# Patient Record
Sex: Male | Born: 1965 | Race: Black or African American | Hispanic: No | Marital: Single | Smoking: Former smoker
Health system: Southern US, Community
[De-identification: ages and names within clinical notes are randomized; demographics above are authoritative.]

## PROBLEM LIST (undated history)

## (undated) DIAGNOSIS — N183 Chronic kidney disease, stage 3 unspecified: Secondary | ICD-10-CM

## (undated) DIAGNOSIS — F03911 Unspecified dementia, unspecified severity, with agitation: Secondary | ICD-10-CM

## (undated) DIAGNOSIS — I639 Cerebral infarction, unspecified: Secondary | ICD-10-CM

## (undated) DIAGNOSIS — F02C0 Dementia in other diseases classified elsewhere, severe, without behavioral disturbance, psychotic disturbance, mood disturbance, and anxiety: Secondary | ICD-10-CM

## (undated) DIAGNOSIS — Z7251 High risk heterosexual behavior: Secondary | ICD-10-CM

## (undated) DIAGNOSIS — F329 Major depressive disorder, single episode, unspecified: Secondary | ICD-10-CM

## (undated) DIAGNOSIS — E119 Type 2 diabetes mellitus without complications: Secondary | ICD-10-CM

## (undated) DIAGNOSIS — F062 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: Secondary | ICD-10-CM

## (undated) DIAGNOSIS — H5440 Blindness, one eye, unspecified eye: Secondary | ICD-10-CM

## (undated) DIAGNOSIS — D649 Anemia, unspecified: Secondary | ICD-10-CM

## (undated) DIAGNOSIS — R3989 Other symptoms and signs involving the genitourinary system: Secondary | ICD-10-CM

## (undated) DIAGNOSIS — F419 Anxiety disorder, unspecified: Secondary | ICD-10-CM

## (undated) DIAGNOSIS — F01A2 Vascular dementia, mild, with psychotic disturbance: Secondary | ICD-10-CM

## (undated) DIAGNOSIS — R112 Nausea with vomiting, unspecified: Secondary | ICD-10-CM

## (undated) DIAGNOSIS — E559 Vitamin D deficiency, unspecified: Secondary | ICD-10-CM

## (undated) DIAGNOSIS — I10 Essential (primary) hypertension: Secondary | ICD-10-CM

## (undated) DIAGNOSIS — E0591 Thyrotoxicosis, unspecified with thyrotoxic crisis or storm: Secondary | ICD-10-CM

## (undated) DIAGNOSIS — Z8616 Personal history of COVID-19: Secondary | ICD-10-CM

## (undated) DIAGNOSIS — K5900 Constipation, unspecified: Secondary | ICD-10-CM

## (undated) DIAGNOSIS — I509 Heart failure, unspecified: Secondary | ICD-10-CM

## (undated) DIAGNOSIS — Z8673 Personal history of transient ischemic attack (TIA), and cerebral infarction without residual deficits: Secondary | ICD-10-CM

## (undated) DIAGNOSIS — R4182 Altered mental status, unspecified: Secondary | ICD-10-CM

## (undated) DIAGNOSIS — N529 Male erectile dysfunction, unspecified: Secondary | ICD-10-CM

## (undated) DIAGNOSIS — R195 Other fecal abnormalities: Secondary | ICD-10-CM

## (undated) DIAGNOSIS — R531 Weakness: Secondary | ICD-10-CM

## (undated) DIAGNOSIS — R52 Pain, unspecified: Secondary | ICD-10-CM

## (undated) HISTORY — PX: GYNECOMASTIA EXCISION: SHX5170

---

## 2005-06-12 ENCOUNTER — Emergency Department (HOSPITAL_COMMUNITY): Admission: EM | Admit: 2005-06-12 | Discharge: 2005-06-12 | Payer: Self-pay | Admitting: Emergency Medicine

## 2005-07-26 ENCOUNTER — Emergency Department (HOSPITAL_COMMUNITY): Admission: EM | Admit: 2005-07-26 | Discharge: 2005-07-26 | Payer: Self-pay | Admitting: Emergency Medicine
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
## 2006-11-21 ENCOUNTER — Emergency Department (HOSPITAL_COMMUNITY): Admission: EM | Admit: 2006-11-21 | Discharge: 2006-11-21 | Payer: Self-pay | Admitting: Emergency Medicine

## 2008-07-10 ENCOUNTER — Emergency Department (HOSPITAL_COMMUNITY): Admission: EM | Admit: 2008-07-10 | Discharge: 2008-07-10 | Payer: Self-pay | Admitting: Emergency Medicine

## 2009-03-07 ENCOUNTER — Emergency Department (HOSPITAL_COMMUNITY): Admission: EM | Admit: 2009-03-07 | Discharge: 2009-03-07 | Payer: Self-pay | Admitting: Family Medicine

## 2011-02-24 ENCOUNTER — Emergency Department (HOSPITAL_COMMUNITY)
Admission: EM | Admit: 2011-02-24 | Discharge: 2011-02-24 | Discharge status: Absent without leave | Payer: Self-pay | Source: Home / Self Care

## 2011-02-25 ENCOUNTER — Emergency Department (INDEPENDENT_AMBULATORY_CARE_PROVIDER_SITE_OTHER)
Admission: EM | Admit: 2011-02-25 | Discharge: 2011-02-25 | Discharge status: Against Medical Advice | Payer: Self-pay | Source: Home / Self Care | Attending: Family Medicine | Admitting: Family Medicine

## 2011-02-25 ENCOUNTER — Encounter (HOSPITAL_COMMUNITY): Payer: Self-pay | Admitting: Family Medicine

## 2011-02-25 DIAGNOSIS — I10 Essential (primary) hypertension: Secondary | ICD-10-CM

## 2011-02-25 NOTE — ED Provider Notes (Addendum)
History     CSN: [id number]  Arrival date & time 02/25/11  0818   First MD Initiated Contact with Patient 02/25/11 0825      Chief Complaint  Patient presents with  . 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 He states his bp was elevated 2 yrs ago but he was passed so he thought it was ok. He does not have a pcp so he came here. He denies any symptoms. No ha, no dizziness, no blurred vision, no weakness. States he drinks a lot of caffeine and smokes. His father has hypertension.   After interview i discussed with him the fact that his blood pressure is very high and that he could have a stroke if left untreated. i told him we would need to do a blood test to check his kidney function and check a urine. He refused to have blood drawn. He left. Tried to reason with him and my nurse also tried to reason with him to no avail.   The history is provided by the patient.    History reviewed. No pertinent past medical history.  History reviewed. No pertinent past surgical history.  No family history on file.  History  Substance Use Topics  . Smoking status: Current Everyday Smoker -- 2.0 packs/day  . Smokeless tobacco: Not on file  . Alcohol Use: No      Review of Systems  Allergies  Review of patient's allergies indicates no known allergies.  Home Medications  No current outpatient prescriptions on file.  BP 218/118  Pulse 104  Temp(Src) 98.1 F (36.7 C) (Oral)  Resp 14  SpO2 100%  Physical Exam  ED Course  Procedures (including critical care time) The patient left after refusing to have blood drawn. Labs Reviewed - No data to display No results found.  Hypertension 1. Hypertension   left ama  MDM          Randa Spike, MD 02/25/11 8295  Randa Spike,  MD 05/31/11 [phone]

## 2011-02-25 NOTE — ED Notes (Signed)
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  Pt denies headache, weakness, SOB or pain.  States he feels good- like normal. No hx of elevated BP per pt.

## 2011-02-25 NOTE — ED Notes (Signed)
Pt refuses blood draw.  States "I don't do needles". Attempted to talk with pt and explain rationale for labs and importance of reducing his very high BP.  Strongly encouraged him to reconsider and explained his BP is dangerously high. States he just wants medication and will not have his blood drawn.  States he is leaving to get a second opinion.  Pt angry/upset, unable to get AMA form- left room and said "thanks for nothing".

## 2011-02-25 NOTE — ED Notes (Signed)
Pt's BP informed to RN

## 2012-04-08 ENCOUNTER — Emergency Department (HOSPITAL_COMMUNITY)
Admission: EM | Admit: 2012-04-08 | Discharge: 2012-04-08 | Discharge status: Against Medical Advice | Payer: No Typology Code available for payment source | Attending: Emergency Medicine | Admitting: Emergency Medicine

## 2012-04-08 ENCOUNTER — Encounter (HOSPITAL_COMMUNITY): Payer: Self-pay | Admitting: *Deleted

## 2012-04-08 DIAGNOSIS — S199XXA Unspecified injury of neck, initial encounter: Secondary | ICD-10-CM | POA: Insufficient documentation

## 2012-04-08 DIAGNOSIS — Y92410 Unspecified street and highway as the place of occurrence of the external cause: Secondary | ICD-10-CM | POA: Insufficient documentation

## 2012-04-08 DIAGNOSIS — S39012A Strain of muscle, fascia and tendon of lower back, initial encounter: Secondary | ICD-10-CM

## 2012-04-08 DIAGNOSIS — S335XXA Sprain of ligaments of lumbar spine, initial encounter: Secondary | ICD-10-CM | POA: Insufficient documentation

## 2012-04-08 DIAGNOSIS — Y9389 Activity, other specified: Secondary | ICD-10-CM | POA: Insufficient documentation

## 2012-04-08 DIAGNOSIS — Z79899 Other long term (current) drug therapy: Secondary | ICD-10-CM | POA: Insufficient documentation

## 2012-04-08 DIAGNOSIS — S0993XA Unspecified injury of face, initial encounter: Secondary | ICD-10-CM | POA: Insufficient documentation

## 2012-04-08 DIAGNOSIS — F17200 Nicotine dependence, unspecified, uncomplicated: Secondary | ICD-10-CM | POA: Insufficient documentation

## 2012-04-08 DIAGNOSIS — I10 Essential (primary) hypertension: Secondary | ICD-10-CM | POA: Insufficient documentation

## 2012-04-08 HISTORY — DX: Essential (primary) hypertension: I10

## 2012-04-08 MED ORDER — AMLODIPINE BESYLATE 10 MG PO TABS
10.0000 mg | ORAL_TABLET | Freq: Once | ORAL | Status: AC
  Administered 2012-04-08: 10 mg via ORAL
  Filled 2012-04-08: qty 1

## 2012-04-08 MED ORDER — HYDROCODONE-ACETAMINOPHEN 5-325 MG PO TABS: 1.0000 | ORAL_TABLET | Freq: Four times a day (QID) | ORAL | Status: DC | PRN

## 2012-04-08 MED ORDER — HYDROCODONE-ACETAMINOPHEN 5-325 MG PO TABS
1.0000 | ORAL_TABLET | Freq: Once | ORAL | Status: AC
  Administered 2012-04-08: 1 via ORAL
  Filled 2012-04-08: qty 1

## 2012-04-08 MED ORDER — AMLODIPINE BESYLATE 2.5 MG PO TABS: 10.0000 mg | ORAL_TABLET | Freq: Every day | ORAL | Status: DC

## 2012-04-08 NOTE — ED Notes (Signed)
MD Lynelle Doctor notified of BP.
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
## 2012-04-08 NOTE — ED Notes (Signed)
Patient decided to leave AMA.  He was informed of his medical need to stay and be treated but refused.

## 2012-04-08 NOTE — ED Notes (Signed)
Patient refused to give urine sample
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
## 2013-04-14 DIAGNOSIS — IMO0001 Reserved for inherently not codable concepts without codable children: Secondary | ICD-10-CM | POA: Insufficient documentation

## 2016-09-26 ENCOUNTER — Other Ambulatory Visit: Payer: Self-pay

## 2016-09-26 ENCOUNTER — Encounter (HOSPITAL_COMMUNITY): Payer: Self-pay | Admitting: Emergency Medicine

## 2016-09-26 ENCOUNTER — Emergency Department (HOSPITAL_COMMUNITY)
Admission: EM | Admit: 2016-09-26 | Discharge: 2016-09-27 | Disposition: A | Discharge status: Home / Self Care | Payer: Self-pay | Attending: Emergency Medicine | Admitting: Emergency Medicine

## 2016-09-26 DIAGNOSIS — Z5321 Procedure and treatment not carried out due to patient leaving prior to being seen by health care provider: Secondary | ICD-10-CM | POA: Insufficient documentation

## 2016-09-26 DIAGNOSIS — W19XXXA Unspecified fall, initial encounter: Secondary | ICD-10-CM | POA: Insufficient documentation

## 2016-09-26 HISTORY — DX: Type 2 diabetes mellitus without complications: E11.9

## 2016-09-26 NOTE — ED Notes (Signed)
Pt came to triage wanting BP rechecked.  Still refuses blood work.  States he took a nap and woke up and still feels fine and wants to leave.

## 2016-09-26 NOTE — ED Notes (Signed)
Pt requested water.  Explained that he would have to see MD first.

## 2016-09-26 NOTE — ED Notes (Signed)
Spoke with pt's mother in waiting room and encouraged her to try to get pt to let us draw his labs.
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
## 2016-09-26 NOTE — ED Notes (Signed)
Explained to pt that he needs lab work drawn and we need to check blood sugar.  He refuses and says he doesn't want to be stuck.

## 2016-09-26 NOTE — ED Notes (Signed)
Pt's mother states that he has been scared on needles since a child and still refuses for [place] to draw his labs.

## 2016-10-21 ENCOUNTER — Emergency Department (HOSPITAL_COMMUNITY)
Admission: EM | Admit: 2016-10-21 | Discharge: 2016-10-21 | Disposition: A | Discharge status: Home / Self Care | Payer: Self-pay | Attending: Emergency Medicine | Admitting: Emergency Medicine

## 2016-10-21 ENCOUNTER — Encounter (HOSPITAL_COMMUNITY): Payer: Self-pay | Admitting: Emergency Medicine

## 2016-10-21 DIAGNOSIS — Y9389 Activity, other specified: Secondary | ICD-10-CM | POA: Insufficient documentation

## 2016-10-21 DIAGNOSIS — Y999 Unspecified external cause status: Secondary | ICD-10-CM | POA: Insufficient documentation

## 2016-10-21 DIAGNOSIS — M791 Myalgia, unspecified site: Secondary | ICD-10-CM | POA: Insufficient documentation

## 2016-10-21 DIAGNOSIS — Z79899 Other long term (current) drug therapy: Secondary | ICD-10-CM | POA: Insufficient documentation

## 2016-10-21 DIAGNOSIS — E119 Type 2 diabetes mellitus without complications: Secondary | ICD-10-CM | POA: Insufficient documentation

## 2016-10-21 DIAGNOSIS — Y92410 Unspecified street and highway as the place of occurrence of the external cause: Secondary | ICD-10-CM | POA: Insufficient documentation

## 2016-10-21 DIAGNOSIS — F17200 Nicotine dependence, unspecified, uncomplicated: Secondary | ICD-10-CM | POA: Insufficient documentation

## 2016-10-21 DIAGNOSIS — I10 Essential (primary) hypertension: Secondary | ICD-10-CM | POA: Insufficient documentation

## 2016-10-21 MED ORDER — ACETAMINOPHEN 325 MG PO TABS: 650.0000 mg | ORAL_TABLET | Freq: Four times a day (QID) | ORAL | 0 refills | Status: DC | PRN

## 2016-10-21 MED ORDER — CYCLOBENZAPRINE HCL 5 MG PO TABS: 5.0000 mg | ORAL_TABLET | Freq: Two times a day (BID) | ORAL | 0 refills | Status: DC | PRN
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
## 2016-10-21 NOTE — Discharge Instructions (Signed)
Take tylenol as needed for pain.  Use ice or heat if it helps control your pain.  Use flexeril as needed for muscle stiffness or soreness. Have caution, as this can cause you to be more tired. Do not drive or operate heavy machinery until you know how this effects you.  Continue to take your blood pressure medication. Try not to take ibuprofen, advil, or motrin, as this can raise your blood pressure.  You will likely have continued muscle stiffness/soreness over the next several days. If your pain has not improved, follow up with your primary care doctor in 1 week.  Follow up with your primary [place] doctor for evaluation and management of your blood pressure.  Return to the ER if you develop vision changes, vomiting, numbness, loss of bowel or bladder control, one-sided weakness, or any new or worsening symptoms.

## 2016-10-21 NOTE — ED Notes (Signed)
Pt ambulated out of the ED in no distress. He declined discharge vitals and stated, "Im not having a headache, dizziness or feelings that it is high. I know what to watch for." Pt states that he thinks his BP is elevated because he is anxious about being in the ED and he smoked 3 cigarettes PTA. A&Ox4.
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
## 2017-02-04 NOTE — Progress Notes (Signed)
[facility] - Clinic Note  02/07/2017     CHIEF COMPLAINT Patient presents for Retina Evaluation and Diabetic Eye Exam   HISTORY OF PRESENT ILLNESS: Kevin [name] is a 52 y.o. male who presents to the clinic today for:   HPI    Retina Evaluation    In both eyes.  This started 2 weeks ago.  Associated Symptoms Negative for Blind Spot, Glare, Shoulder/Hip pain, Fatigue, Jaw Claudication, Photophobia, Distortion, Floaters, Redness, Scalp Tenderness, Weight Loss, Fever, Trauma, Pain and Flashes.  Context:  distance vision, mid-range vision and near vision.  Treatments tried include no treatments.  I, the attending physician,  performed the HPI with the patient and updated documentation appropriately.          Diabetic Eye Exam    Vision is blurred for near and is blurred for distance.  Associated Symptoms Negative for Blind Spot, Glare, Shoulder/Hip pain, Fatigue, Jaw Claudication, Photophobia, Distortion, Floaters, Redness, Scalp Tenderness, Weight Loss, Fever, Trauma, Pain and Flashes.  Diabetes characteristics include Type 2 and taking oral medications.  This started 1 year ago.  Blood sugar level is controlled.  Last Blood Glucose 102.  I, the attending physician,  performed the HPI with the patient and updated documentation appropriately.          Comments    Referral of Dr. Conley Rolls for retina evaluation. Patient states vision has become "blurry left eye in the last couple of weeks". Pt reports he is a diabetic 2 and takes Metformin qd. Pt states his BS are stable and he has not noticed any visual changes with BS. Bs 102 (02/06/17) Last A1C 10/2016( pt does not recall results). Denies vits/gtts        Last edited by Rennis Chris, MD on 02/07/2017 10:08 AM. (History)    Pt states he saw Dr. 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; Pt reports he is diabetic and has  HTN; Pt reports there was a time he was off all meds x 1 year due to "they made me feel funny"; Pt states he began medications back around Thanksgiving; Pt states he had a to call an ambulance around that time due to falling out of a chair, pt states his CBG was 800 and reports BP was very elevated; Pt states he refused to stay in hospital for monitoring; Pt reports he "has not problem with right eye" VA; Pt reports last CBG was 102 yesterday, pt states he is on medications, not watching what he eats; Pt reports he does smoke; Pt reports he was seen by PCP in November, reports he added HCTZ for "ankles swelling"; Pt report he takes pills regularly; Pt endorses taking medications this morning; Pt denies having any headaches; pt denies having any childhood ocular issues; Pt states he was incarcerated x 30 days, pt states he was given medications but was unsure if they were "the right medications";   Referring physician: Conley Rolls [place], [place] [address] [place], [place] [address]  HISTORICAL INFORMATION:   Selected notes from the MEDICAL RECORD NUMBER Referred by Dr. Aletta Edouard for concern of decrease VA OU, OS>OD;  LEE- unknown [BCVA OD: 20/60 OS: 20/400] Ocular Hx-  PMH- DM, HTN, current smoker    CURRENT MEDICATIONS: No current outpatient medications on file. (Ophthalmic Drugs)   No current facility-administered medications  for this visit.  (Ophthalmic Drugs)   Current Outpatient Medications (Other)  Medication Sig  . lisinopril-hydrochlorothiazide (PRINZIDE,ZESTORETIC) 10-12.5 MG tablet Take 1 tablet by mouth daily.  . metFORMIN (GLUCOPHAGE) 1000 MG tablet Take 1,000 mg by mouth 2 (two) times daily.  Marland Kitchen acetaminophen (TYLENOL) 325 MG tablet Take 2 tablets (650 mg total) by mouth every 6 (six) hours as needed (pain). (Patient not taking: Reported on 02/07/2017)  . amLODipine (NORVASC) 2.5 MG tablet Take 4 tablets (10 mg total) by mouth daily. (Patient not taking: Reported on 02/07/2017)  .  cyclobenzaprine (FLEXERIL) 5 MG tablet Take 1 tablet (5 mg total) by mouth 2 (two) times daily as needed for muscle spasms. (Patient not taking: Reported on 02/07/2017)  . HYDROcodone-acetaminophen (NORCO) 5-325 MG per tablet Take 1-2 tablets by mouth every 6 (six) hours as needed for pain. (Patient not taking: Reported on 02/07/2017)   No current facility-administered medications for this visit.  (Other)      REVIEW OF SYSTEMS: ROS    Positive for: Endocrine, Eyes   Negative for: Constitutional, Gastrointestinal, Neurological, Skin, Genitourinary, Musculoskeletal, HENT, Cardiovascular, Respiratory, Psychiatric, Allergic/Imm, Heme/Lymph   Last edited by Eldridge Scot, LPN on 6/57/8469  9:59 AM. (History)       ALLERGIES No Known Allergies  PAST MEDICAL HISTORY Past Medical History:  Diagnosis Date  . Diabetes mellitus without complication (HCC)   . Hypertension    History reviewed. No pertinent surgical history.  FAMILY HISTORY Family History  Problem Relation Age of Onset  . Diabetes Mother   . Hypertension Mother   . Hypertension Father   . Diabetes Maternal Grandmother   . Hypertension Maternal Grandmother   . Arthritis Maternal Grandmother     SOCIAL HISTORY Social History   Tobacco Use  . Smoking status: Current Every Day Smoker    Packs/day: 2.00  . Smokeless tobacco: Never Used  Substance Use Topics  . Alcohol use: Yes  . Drug use: Yes    Types: Marijuana         OPHTHALMIC EXAM:  Base Eye Exam    Visual Acuity (Snellen - Linear)      Right Left   Dist [place] 20/70 -1 CF @ face   Dist ph [place] 20/50 -1 NI       Tonometry (Tonopen, 10:03 AM)      Right Left   Pressure 11 13       Pupils      Dark Light Shape React APD   Right 5 4 Round Minimal None   Left 5 4 Round Minimal None       Visual Fields (Counting fingers)      Left Right     Full   Restrictions Partial outer inferior nasal deficiency        Extraocular Movement      Right  Left    Full, Ortho Full, Ortho       Neuro/Psych    Oriented x3:  Yes   Mood/Affect:  Normal       Dilation    Both eyes:  1.0% Mydriacyl, 2.5% Phenylephrine @ 10:03 AM        Slit Lamp and Fundus Exam    Slit Lamp Exam      Right Left   Lids/Lashes Meibomian gland dysfunction Meibomian gland dysfunction   Conjunctiva/Sclera Melanosis Melanosis   Cornea Mild Arcus, mild Debris in tear film Mild Arcus, mild Debris in tear film   Anterior Chamber Deep and quiet Deep  and quiet   Iris Round and dilated, No NVI Round and dilated, No NVI   Lens 2+ Nuclear sclerosis, 2+ Cortical cataract 2+ Nuclear sclerosis, 2+ Cortical cataract   Vitreous Normal Normal       Fundus Exam      Right Left   Disc Inferior NVD/collaterals 2+ Optic disc edema, 360 heme   C/D Ratio 0.35 0.2   Macula Blunted foveal reflex, +CME, inferior IRH massive swelling and IRH   Vessels Dilation, Tortuous, inferior sclerotic arteriorls, inferior BRVO, Copper wiring CRVO; Copper wiring, sclerotic vessles, Vascular attenuation, Dilation, Tortuous   Periphery Attached; inferior BRVO with IRH Confluent hemorrhage 360          IMAGING AND PROCEDURES  Imaging and Procedures for 02/07/17  OCT, Retina - OU - Both Eyes     Right Eye Quality was good. Central Foveal Thickness: 604. Progression has no prior data. Findings include abnormal foveal contour, subretinal fluid, intraretinal fluid, outer retinal atrophy.   Left Eye Quality was good. Central Foveal Thickness: (Unable to obtain). Progression has no prior data. Findings include intraretinal fluid, intraretinal hyper-reflective material, abnormal foveal contour, subretinal fluid (Massive CME w/ SRF).   Notes *Images captured and stored on drive  Diagnosis / Impression:  CME and SRF OU, OS >>> OD  Clinical management:  See below  Abbreviations: NFP - Normal foveal profile. CME - cystoid macular edema. PED - pigment epithelial detachment. IRF -  intraretinal fluid. SRF - subretinal fluid. EZ - ellipsoid zone. ERM - epiretinal membrane. ORA - outer retinal atrophy. ORT - outer retinal tubulation. SRHM - subretinal hyper-reflective material                 ASSESSMENT/PLAN:    ICD-10-CM   1. Central retinal vein occlusion with macular edema of left eye H34.8120 OCT, Retina - OU - Both Eyes  2. Branch retinal vein occlusion of right eye with macular edema H34.8310   3. Essential hypertension I10   4. Hypertensive retinopathy of both eyes H35.033   5. Moderate nonproliferative diabetic retinopathy of both eyes with macular edema associated with type 2 diabetes mellitus (HCC) Z61.0960E11.3313   6. Combined forms of age-related cataract of both eyes H25.813     1. 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  - Specifically discussed findings from CRUISE / BRAVO study regarding patient stabilization with anti-VEGF agents and increased potential for visual improvements.  Also discussed need for frequent follow up and potentially multiple injections given the chronic nature of the disease process  - massive CME and inner retinal hyperreflectivity on OCT OS suggesting combined CRVO and CRAO OS  - BP in office 220s/130s  - discussed importance of tighter control of BP and need for anti-VEGF therapy, but cannot treat with BP so high  - recommend immediate evaluation of BP by PCP or ED  - will send letter to PCP  - F/U 2 weeks   2. BRVO w/ macular edema OD - inferior BRVO with inferior disc collaterals vs sectoral NVD  OD - OCT shows central CME OD - recommend IVA, but as above, BP too high to treat - F/U  2 weeks -- DFE/OCT/possible injection  3,4. Hypertensive retinopathy OU - discussed importance of tight BP control - monitor  5. Moderate non-proliferative diabetic retinopathy, both eyes - The incidence, risk factors for progression, natural history and treatment options for diabetic retinopathy were discussed with patient.   - The need for close monitoring of blood glucose, blood pressure, and serum lipids, avoiding cigarette or any type of tobacco, and the need for long term follow up was also discussed with patient. - DM2 fairly well controlled, but likely contributing to #1,2 above  6. Combined form age-related cataract OU-  - The symptoms of cataract, surgical options, and treatments and risks were discussed with patient. - discussed diagnosis and progression - not yet visually significant - monitor for now   Ophthalmic Meds Ordered this visit:  No orders of the defined types were placed in this encounter.      Return in about 2 weeks (around 02/21/2017) for F/U CRAO/CRVO OS.  There are no Patient Instructions on file for this visit.   Explained the diagnoses, plan, and follow up with the patient and they expressed understanding.  Patient expressed understanding of the importance of proper follow up care.   This document serves as a record of services personally performed by Karie Chimera, MD, PhD. It was created on their behalf by Virgilio Belling, COA, a certified ophthalmic assistant. The creation of this record is the provider's dictation and/or activities during the visit.  Electronically signed by: Virgilio Belling, COA  02/07/17 11:27 AM    Karie Chimera, M.D., Ph.D. Diseases & Surgery of the Retina and Vitreous [facility] 02/07/17   I have reviewed the above documentation for accuracy and completeness, and I agree with the above. Karie Chimera,  M.D., Ph.D. 02/07/17 11:41 AM    Abbreviations: M myopia (nearsighted); A astigmatism; H hyperopia (farsighted); P presbyopia; Mrx spectacle prescription;  CTL contact lenses; OD right eye; OS left eye; OU both eyes  XT exotropia; ET esotropia; PEK punctate epithelial keratitis; PEE punctate epithelial erosions; DES dry eye syndrome; MGD meibomian gland dysfunction; ATs artificial tears; PFAT's preservative free artificial tears; NSC nuclear sclerotic cataract; PSC posterior subcapsular cataract; ERM epi-retinal membrane; PVD posterior vitreous detachment; RD retinal detachment; DM diabetes mellitus; DR diabetic retinopathy; NPDR non-proliferative diabetic retinopathy; PDR proliferative diabetic retinopathy; CSME clinically significant macular edema; DME diabetic macular edema; dbh dot blot hemorrhages; CWS cotton wool spot; POAG primary open angle glaucoma; C/D cup-to-disc ratio; HVF humphrey visual field; GVF goldmann visual field; OCT optical coherence tomography; IOP intraocular pressure; BRVO Branch retinal vein occlusion; CRVO central retinal vein occlusion; CRAO central retinal artery occlusion; BRAO branch retinal artery occlusion; RT retinal tear; SB scleral buckle; PPV pars plana vitrectomy; VH Vitreous hemorrhage; PRP panretinal laser photocoagulation; IVK intravitreal kenalog; VMT vitreomacular traction; MH Macular hole;  NVD neovascularization of the disc; NVE neovascularization elsewhere; AREDS age related eye disease study; ARMD age related macular degeneration; POAG primary open angle glaucoma; EBMD epithelial/anterior basement membrane dystrophy; ACIOL anterior chamber intraocular lens; IOL intraocular lens; PCIOL posterior chamber intraocular lens; Phaco/IOL phacoemulsification with intraocular lens placement; PRK photorefractive keratectomy; LASIK laser assisted in situ keratomileusis; HTN hypertension; DM diabetes mellitus; COPD chronic obstructive pulmonary disease

## 2017-02-07 ENCOUNTER — Ambulatory Visit (INDEPENDENT_AMBULATORY_CARE_PROVIDER_SITE_OTHER): Payer: Self-pay | Admitting: Ophthalmology

## 2017-02-07 ENCOUNTER — Encounter (INDEPENDENT_AMBULATORY_CARE_PROVIDER_SITE_OTHER): Payer: Self-pay | Admitting: Ophthalmology

## 2017-02-07 VITALS — BP 198/132 | HR 106

## 2017-02-07 DIAGNOSIS — E113313 Type 2 diabetes mellitus with moderate nonproliferative diabetic retinopathy with macular edema, bilateral: Secondary | ICD-10-CM

## 2017-02-07 DIAGNOSIS — H34812 Central retinal vein occlusion, left eye, with macular edema: Secondary | ICD-10-CM

## 2017-02-07 DIAGNOSIS — H35033 Hypertensive retinopathy, bilateral: Secondary | ICD-10-CM

## 2017-02-07 DIAGNOSIS — I10 Essential (primary) hypertension: Secondary | ICD-10-CM

## 2017-02-07 DIAGNOSIS — H25813 Combined forms of age-related cataract, bilateral: Secondary | ICD-10-CM

## 2017-02-07 DIAGNOSIS — H34831 Tributary (branch) retinal vein occlusion, right eye, with macular edema: Secondary | ICD-10-CM

## 2017-02-17 NOTE — Progress Notes (Addendum)
[facility] - Clinic Note  02/21/2017     CHIEF COMPLAINT Patient presents for Retina Follow Up   HISTORY OF PRESENT ILLNESS: Kevin [name] is a 52 y.o. male who presents to the clinic today for:   HPI    Retina Follow Up    Patient presents with  CRVO/BRVO.  In left eye.  Severity is moderate.  Duration of 2 weeks.  Since onset it is stable.  I, the attending physician,  performed the HPI with the patient and updated documentation appropriately.          Comments    Pt presents today for F/U for CRAO/CRVO OS, pt states vision is the same since then, pt states it looks like there is a fire somewhere when it's bright outside, pt denies flashes, floaters, wavy vision or pain, pt denies the use of gtts, pt did not check BS this morning, pt blood pressure was 178/113 this morning       Last edited by Rennis Chris, MD on 02/21/2017  9:27 AM. (History)    Pt states he saw PCP after last visit, states PCP increased BP med "from one pill to two pills a day"; Pt reports he continues to have elevated BP reading daily;   Referring physician: Billee Cashing, MD [address] [place], [place] [address]  HISTORICAL INFORMATION:   Selected notes from the MEDICAL RECORD NUMBER Referred by Dr. Aletta Edouard for concern of decrease VA OU, OS>OD;  LEE- unknown [BCVA OD: 20/60 OS: 20/400] Ocular Hx-  PMH- DM, HTN, current smoker    CURRENT MEDICATIONS: No current outpatient medications on file. (Ophthalmic Drugs)   No current facility-administered medications for this visit.  (Ophthalmic Drugs)   Current Outpatient Medications (Other)  Medication Sig  . acetaminophen (TYLENOL) 325 MG tablet Take 2 tablets (650 mg total) by mouth every 6 (six) hours as needed (pain). (Patient not taking: Reported on 02/07/2017)  . amLODipine (NORVASC) 2.5 MG tablet Take 4 tablets (10 mg total) by mouth daily. (Patient not taking: Reported on 02/07/2017)  . cyclobenzaprine (FLEXERIL) 5 MG  tablet Take 1 tablet (5 mg total) by mouth 2 (two) times daily as needed for muscle spasms. (Patient not taking: Reported on 02/07/2017)  . HYDROcodone-acetaminophen (NORCO) 5-325 MG per tablet Take 1-2 tablets by mouth every 6 (six) hours as needed for pain. (Patient not taking: Reported on 02/07/2017)  . lisinopril-hydrochlorothiazide (PRINZIDE,ZESTORETIC) 10-12.5 MG tablet Take 2 tablets by mouth daily.   . metFORMIN (GLUCOPHAGE) 1000 MG tablet Take 1,000 mg by mouth 2 (two) times daily.   Current Facility-Administered Medications (Other)  Medication Route  . Bevacizumab (AVASTIN) SOLN 1.25 mg Intravitreal      REVIEW OF SYSTEMS: ROS    Positive for: Endocrine, Cardiovascular, Eyes   Negative for: Constitutional, Gastrointestinal, Neurological, Skin, Genitourinary, Musculoskeletal, HENT, Respiratory, Psychiatric, Allergic/Imm, Heme/Lymph   Last edited by Posey Boyer, COT on 02/21/2017  9:15 AM. (History)       ALLERGIES No Known Allergies  PAST MEDICAL HISTORY Past Medical History:  Diagnosis Date  . Diabetes mellitus without complication (HCC)   . Hypertension    History reviewed. No pertinent surgical history.  FAMILY HISTORY Family History  Problem Relation Age of Onset  . Diabetes Mother   . Hypertension Mother   . Hypertension Father   . Diabetes Maternal Grandmother   . Hypertension Maternal Grandmother   . Arthritis Maternal Grandmother     SOCIAL HISTORY Social  History   Tobacco Use  . Smoking status: Current Every Day Smoker    Packs/day: 2.00  . Smokeless tobacco: Never Used  Substance Use Topics  . Alcohol use: Yes  . Drug use: Yes    Types: Marijuana         OPHTHALMIC EXAM:  Base Eye Exam    Visual Acuity (Snellen - Linear)      Right Left   Dist [place] 20/80 +1 CF at 2'   Dist ph [place] 20/40 -2 NI       Tonometry (Tonopen, 9:52 AM)      Right Left   Pressure 16 16       Pupils      Dark Light Shape React APD   Right 5 4 Round  Minimal None   Left 5 4 Round Minimal None       Visual Fields (Counting fingers)      Left Right     Full   Restrictions Total inferior temporal deficiency; Partial outer inferior nasal deficiency        Extraocular Movement      Right Left    Full, Ortho Full, Ortho       Neuro/Psych    Oriented x3:  Yes   Mood/Affect:  Normal       Dilation    Both eyes:  1.0% Mydriacyl, 2.5% Phenylephrine @ 9:24 AM        Slit Lamp and Fundus Exam    Slit Lamp Exam      Right Left   Lids/Lashes Meibomian gland dysfunction Meibomian gland dysfunction   Conjunctiva/Sclera Melanosis Melanosis   Cornea Mild Arcus, mild Debris in tear film Mild Arcus, mild Debris in tear film   Anterior Chamber Deep and quiet Deep and quiet   Iris Round and dilated, No NVI Round and dilated, No NVI   Lens 2+ Nuclear sclerosis, 2+ Cortical cataract 2+ Nuclear sclerosis, 2+ Cortical cataract   Vitreous Normal Normal       Fundus Exam      Right Left   Disc Inferior NVD/collaterals 2-3+ Optic disc edema, 360 heme   C/D Ratio 0.35 0.2   Macula Blunted foveal reflex, +CME, inferior IRH massive swelling and IRH   Vessels Dilation, Tortuous, inferior sclerotic arteriorls, inferior BRVO, Copper wiring CRVO; Copper wiring, sclerotic vessles, Vascular attenuation, Dilation, Tortuous   Periphery Attached; inferior BRVO with IRH Confluent hemorrhage 360          IMAGING AND PROCEDURES  Imaging and Procedures for 02/21/17  OCT, Retina - OU - Both Eyes     Right Eye Quality was good. Central Foveal Thickness: 677. Progression has worsened. Findings include abnormal foveal contour, subretinal fluid, intraretinal fluid, outer retinal atrophy.   Left Eye Quality was good. Central Foveal Thickness: (Unable to obtain). Progression has no prior data. Findings include intraretinal fluid, intraretinal hyper-reflective material, abnormal foveal contour, subretinal fluid (Massive CME w/ SRF).   Notes *Images  captured and stored on drive  Diagnosis / Impression:  CME and SRF OU, OS >>> OD Interval worsening from prior  Clinical management:  See below  Abbreviations: NFP - Normal foveal profile. CME - cystoid macular edema. PED - pigment epithelial detachment. IRF - intraretinal fluid. SRF - subretinal fluid. EZ - ellipsoid zone. ERM - epiretinal membrane. ORA - outer retinal atrophy. ORT - outer retinal tubulation. SRHM - subretinal hyper-reflective material        Intravitreal Injection, Pharmacologic Agent - OS - Left  Eye     Time Out 02/21/2017. 10:35 AM. Confirmed correct patient, procedure, site, and patient consented.   Anesthesia Topical anesthesia was used. Anesthetic medications included Lidocaine 2%, Tetracaine 0.5%.   Procedure Preparation included 5% betadine to ocular surface. A supplied needle was used.   Injection: 1.25 mg Bevacizumab 1.25mg /0.10ml   NDC: 19147-829-56    Lot: [id number]     Expiration Date: 03/30/2017   Route: Intravitreal   Site: Left Eye   Waste: 0 mg  Post-op Post injection exam found visual acuity of at least counting fingers. The patient tolerated the procedure well. There were no complications. The patient received written and verbal post procedure care education.                 ASSESSMENT/PLAN:    ICD-10-CM   1. Central retinal vein occlusion with macular edema of left eye H34.8120 OCT, Retina - OU - Both Eyes    Intravitreal Injection, Pharmacologic Agent - OS - Left Eye    Bevacizumab (AVASTIN) SOLN 1.25 mg  2. Branch retinal vein occlusion of right eye with macular edema H34.8310 OCT, Retina - OU - Both Eyes  3. Essential hypertension I10   4. Hypertensive retinopathy of both eyes H35.033   5. Moderate nonproliferative diabetic retinopathy of both eyes with macular edema associated with type 2 diabetes mellitus (HCC) O13.0865   6. Combined forms of age-related cataract of both eyes H25.813     1. CRVO with severe macular  edema OS-   - The natural history of retinal vein occlusion and macular edema and treatment options including observation, laser photocoagulation, and intravitreal antiVEGF injection with Avastin and Lucentis and Eylea and intravitreal injection of steroids with triamcinolone and Ozurdex and the complications of these procedures including loss of vision, infection, cataract, glaucoma, and retinal detachment were discussed with patient.  - Specifically discussed findings from CRUISE / BRAVO study regarding patient stabilization with anti-VEGF agents and increased potential for visual improvements.  Also discussed need for frequent follow up and potentially multiple injections given the chronic nature of the disease process  - massive CME and inner retinal hyperreflectivity on OCT OS suggesting possible CRAO component OS  - BP in office 180s/130s today -- improved from prior (lisinopril dose increased to 2 pills a day from 1 by PCP)  - discussed importance of tighter control of BP  - recommend close monitoring of BP by PCP  - recommend IVA OS today (02.11.19)  - pt wishes to proceed with IVA OS  - RBA of procedure discussed, questions answered  - informed consent obtained and signed  - see procedure note  - pt tolerated well  - F/U 2 weeks, possible injxn OD  2. BRVO w/ macular edema OD - inferior BRVO with inferior disc collaterals vs sectoral NVD OD - OCT shows central CME OD -- interval increase from 2 wks ago - recommend IVA OD in 2 wks - F/U 2 weeks -- DFE/OCT/possible injection  3,4. Hypertensive retinopathy OU - discussed importance of tight BP control - monitor  5. Moderate non-proliferative diabetic retinopathy, both eyes - The incidence, risk factors for progression, natural history and treatment options for diabetic retinopathy were discussed with patient.   - The need for close monitoring of blood glucose, blood pressure, and serum lipids, avoiding cigarette or any type of  tobacco, and the need for long term follow up was also discussed with patient. - DM2 fairly well controlled, but likely contributing to #1,2 above  6.  Combined form age-related cataract OU-  - The symptoms of cataract, surgical options, and treatments and risks were discussed with patient. - discussed diagnosis and progression - not yet visually significant - monitor for now   Ophthalmic Meds Ordered this visit:  Meds ordered this encounter  Medications  . Bevacizumab (AVASTIN) SOLN 1.25 mg       Return in about 2 weeks (around 03/07/2017) for Dilated Exam, OCT, Possible Injxn.  There are no Patient Instructions on file for this visit.   Explained the diagnoses, plan, and follow up with the patient and they expressed understanding.  Patient expressed understanding of the importance of proper follow up care.   This document serves as a record of services personally performed by Karie Chimera, MD, PhD. It was created on their behalf by Virgilio Belling, COA, a certified ophthalmic assistant. The creation of this record is the provider's dictation and/or activities during the visit.  Electronically signed by: Virgilio Belling, COA  02/21/17 11:25 AM    Karie Chimera, M.D., Ph.D. Diseases & Surgery of the Retina and Vitreous [facility] 02/21/17   I have reviewed the above documentation for accuracy and completeness, and I agree with the above. Karie Chimera, M.D., Ph.D. 02/21/17 11:30 AM     Abbreviations: M myopia (nearsighted); A astigmatism; H hyperopia (farsighted); P presbyopia; Mrx spectacle prescription;  CTL contact lenses; OD right eye; OS left eye; OU both eyes  XT exotropia; ET esotropia; PEK punctate epithelial keratitis; PEE punctate epithelial erosions; DES dry eye syndrome; MGD meibomian gland dysfunction; ATs artificial tears; PFAT's preservative free artificial tears; NSC nuclear sclerotic cataract; PSC posterior subcapsular cataract;  ERM epi-retinal membrane; PVD posterior vitreous detachment; RD retinal detachment; DM diabetes mellitus; DR diabetic retinopathy; NPDR non-proliferative diabetic retinopathy; PDR proliferative diabetic retinopathy; CSME clinically significant macular edema; DME diabetic macular edema; dbh dot blot hemorrhages; CWS cotton wool spot; POAG primary open angle glaucoma; C/D cup-to-disc ratio; HVF humphrey visual field; GVF goldmann visual field; OCT optical coherence tomography; IOP intraocular pressure; BRVO Branch retinal vein occlusion; CRVO central retinal vein occlusion; CRAO central retinal artery occlusion; BRAO branch retinal artery occlusion; RT retinal tear; SB scleral buckle; PPV pars plana vitrectomy; VH Vitreous hemorrhage; PRP panretinal laser photocoagulation; IVK intravitreal kenalog; VMT vitreomacular traction; MH Macular hole;  NVD neovascularization of the disc; NVE neovascularization elsewhere; AREDS age related eye disease study; ARMD age related macular degeneration; POAG primary open angle glaucoma; EBMD epithelial/anterior basement membrane dystrophy; ACIOL anterior chamber intraocular lens; IOL intraocular lens; PCIOL posterior chamber intraocular lens; Phaco/IOL phacoemulsification with intraocular lens placement; PRK photorefractive keratectomy; LASIK laser assisted in situ keratomileusis; HTN hypertension; DM diabetes mellitus; COPD chronic obstructive pulmonary disease

## 2017-02-21 ENCOUNTER — Encounter (INDEPENDENT_AMBULATORY_CARE_PROVIDER_SITE_OTHER): Payer: Self-pay | Admitting: Ophthalmology

## 2017-02-21 ENCOUNTER — Ambulatory Visit (INDEPENDENT_AMBULATORY_CARE_PROVIDER_SITE_OTHER): Payer: Self-pay | Admitting: Ophthalmology

## 2017-02-21 VITALS — BP 184/132 | HR 95

## 2017-02-21 DIAGNOSIS — H34831 Tributary (branch) retinal vein occlusion, right eye, with macular edema: Secondary | ICD-10-CM

## 2017-02-21 DIAGNOSIS — E113313 Type 2 diabetes mellitus with moderate nonproliferative diabetic retinopathy with macular edema, bilateral: Secondary | ICD-10-CM

## 2017-02-21 DIAGNOSIS — I10 Essential (primary) hypertension: Secondary | ICD-10-CM

## 2017-02-21 DIAGNOSIS — H34812 Central retinal vein occlusion, left eye, with macular edema: Secondary | ICD-10-CM

## 2017-02-21 DIAGNOSIS — H25813 Combined forms of age-related cataract, bilateral: Secondary | ICD-10-CM

## 2017-02-21 DIAGNOSIS — H35033 Hypertensive retinopathy, bilateral: Secondary | ICD-10-CM

## 2017-02-21 MED ORDER — BEVACIZUMAB CHEMO INJECTION 1.25MG/0.05ML SYRINGE FOR KALEIDOSCOPE
1.2500 mg | INTRAVITREAL | Status: DC
  Administered 2017-02-21: 1.25 mg via INTRAVITREAL

## 2017-03-04 NOTE — Progress Notes (Signed)
[facility] - Clinic Note  03/08/2017     CHIEF COMPLAINT Patient presents for Retina Follow Up   HISTORY OF PRESENT ILLNESS: Kevin [name] is a 52 y.o. male who presents to the clinic today for:   HPI    Retina Follow Up    Patient presents with  CRVO/BRVO.  In both eyes.  This started 1 month ago.  Severity is mild.  Since onset it is stable.  I, the attending physician,  performed the HPI with the patient and updated documentation appropriately.          Comments    F/U CRVO W/severe ME OS. Patient states his vision remains cloudy, mostly in the morning . Pt reports his eyes are very sensitive to sun lightDenies Wavy vision,flashes and ocular pain. Bs 102 this am, A1C (unsure of result ) continues Metformin Qd. Pts BP was high last ov, pt was advised to see PCP or go to ED. Pt saw DR. McKenzie. Pt reports his Lisinopril was increased from 10 mg to 20 mg QD. Pt Bp was 202/140 left arm x 2. Pt advised to go to PCP OR ED.        Last edited by Rennis Chris, MD on 03/08/2017 11:19 AM. (History)    Pt states PCP no longer practicing and is in the process of finding a new PCP  Referring physician: Billee Cashing, MD [address] [place], [place] [address]  HISTORICAL INFORMATION:   Selected notes from the MEDICAL RECORD NUMBER Referred by Dr. Aletta Edouard for concern of decrease VA OU, OS>OD;  LEE- unknown [BCVA OD: 20/60 OS: 20/400] Ocular Hx-  PMH- DM, HTN, current smoker    CURRENT MEDICATIONS: No current outpatient medications on file. (Ophthalmic Drugs)   No current facility-administered medications for this visit.  (Ophthalmic Drugs)   Current Outpatient Medications (Other)  Medication Sig  . acetaminophen (TYLENOL) 325 MG tablet Take 2 tablets (650 mg total) by mouth every 6 (six) hours as needed (pain).  Marland Kitchen amLODipine (NORVASC) 2.5 MG tablet Take 4 tablets (10 mg total) by mouth daily.  . cyclobenzaprine (FLEXERIL) 5 MG tablet Take 1 tablet  (5 mg total) by mouth 2 (two) times daily as needed for muscle spasms.  Marland Kitchen HYDROcodone-acetaminophen (NORCO) 5-325 MG per tablet Take 1-2 tablets by mouth every 6 (six) hours as needed for pain.  Marland Kitchen lisinopril-hydrochlorothiazide (PRINZIDE,ZESTORETIC) 10-12.5 MG tablet Take 2 tablets by mouth daily.   . metFORMIN (GLUCOPHAGE) 1000 MG tablet Take 1,000 mg by mouth 2 (two) times daily.   Current Facility-Administered Medications (Other)  Medication Route  . Bevacizumab (AVASTIN) SOLN 1.25 mg Intravitreal      REVIEW OF SYSTEMS: ROS    Positive for: Endocrine, Eyes   Negative for: Constitutional, Gastrointestinal, Neurological, Skin, Genitourinary, Musculoskeletal, HENT, Cardiovascular, Respiratory, Psychiatric, Allergic/Imm, Heme/Lymph   Last edited by Eldridge Scot, LPN on 06/15/5407 10:19 AM. (History)       ALLERGIES No Known Allergies  PAST MEDICAL HISTORY Past Medical History:  Diagnosis Date  . Diabetes mellitus without complication (HCC)   . Hypertension    History reviewed. No pertinent surgical history.  FAMILY HISTORY Family History  Problem Relation Age of Onset  . Diabetes Mother   . Hypertension Mother   . Hypertension Father   . Diabetes Maternal Grandmother   . Hypertension Maternal Grandmother   . Arthritis Maternal Grandmother     SOCIAL HISTORY Social History   Tobacco Use  .  Smoking status: Current Every Day Smoker    Packs/day: 2.00  . Smokeless tobacco: Never Used  Substance Use Topics  . Alcohol use: Yes  . Drug use: Yes    Types: Marijuana         OPHTHALMIC EXAM:  Base Eye Exam    Visual Acuity (Snellen - Linear)      Right Left   Dist [place] 20/60 -2 CF @ 4'   Dist ph [place] NI NI  Questionable effort       Tonometry (Tonopen, 10:31 AM)      Right Left   Pressure 16 14       Pupils      Dark Light Shape React APD   Right 4 4 Round Minimal None   Left 4 4 Round Minimal None       Visual Fields (Counting fingers)      Left  Right    Full Full  Poor pt coop       Extraocular Movement      Right Left    Full, Ortho Full, Ortho       Neuro/Psych    Oriented x3:  Yes   Mood/Affect:  Normal       Dilation    Right eye:  1.0% Mydriacyl, 2.5% Phenylephrine @ 10:31 AM  OD only dilated        Slit Lamp and Fundus Exam    Slit Lamp Exam      Right Left   Lids/Lashes Meibomian gland dysfunction Meibomian gland dysfunction   Conjunctiva/Sclera Melanosis Melanosis   Cornea Mild Arcus, mild Debris in tear film Mild Arcus, mild Debris in tear film   Anterior Chamber Deep and quiet Deep and quiet   Iris Round and dilated, No NVI Round and dilated, No NVI   Lens 2+ Nuclear sclerosis, 2+ Cortical cataract 2+ Nuclear sclerosis, 2+ Cortical cataract   Vitreous Normal Normal       Fundus Exam      Right Left   Disc Inferior NVD/collaterals 2-3+ Optic disc edema, 360 heme   C/D Ratio 0.35 0.2   Macula Blunted foveal reflex, +CME, inferior IRH and flame hemorrhages massive swelling and IRH   Vessels Dilation, Tortuous, inferior sclerotic arteriorls, inferior BRVO, Copper wiring CRVO; Copper wiring, sclerotic vessles, Vascular attenuation, Dilation, Tortuous   Periphery Attached; inferior BRVO with IRH Confluent hemorrhage 360          IMAGING AND PROCEDURES  Imaging and Procedures for 03/08/17  OCT, Retina - OU - Both Eyes     Right Eye Quality was good. Central Foveal Thickness: 665. Progression has been stable. Findings include abnormal foveal contour, subretinal fluid, intraretinal fluid, outer retinal atrophy.   Left Eye Quality was good. Central Foveal Thickness: (Unable to obtain). Progression has improved. Findings include intraretinal fluid, intraretinal hyper-reflective material, abnormal foveal contour, subretinal fluid (Massive CME w/ SRF).   Notes *Images captured and stored on drive  Diagnosis / Impression:  CME and SRF OU, OS >>> OD - interval mild improvement in CME OS  Clinical  management:  See below  Abbreviations: NFP - Normal foveal profile. CME - cystoid macular edema. PED - pigment epithelial detachment. IRF - intraretinal fluid. SRF - subretinal fluid. EZ - ellipsoid zone. ERM - epiretinal membrane. ORA - outer retinal atrophy. ORT - outer retinal tubulation. SRHM - subretinal hyper-reflective material                 ASSESSMENT/PLAN:  ICD-10-CM   1. Central retinal vein occlusion with macular edema of left eye H34.8120 OCT, Retina - OU - Both Eyes  2. Branch retinal vein occlusion of right eye with macular edema H34.8310 OCT, Retina - OU - Both Eyes  3. Essential hypertension I10   4. Hypertensive retinopathy of both eyes H35.033   5. Moderate nonproliferative diabetic retinopathy of both eyes with macular edema associated with type 2 diabetes mellitus (HCC) Z61.0960   6. Combined forms of age-related cataract of both eyes H25.813     1. CRVO with severe macular edema OS-   - The natural history of retinal vein occlusion and macular edema and treatment options including observation, laser photocoagulation, and intravitreal antiVEGF injection with Avastin and Lucentis and Eylea and intravitreal injection of steroids with triamcinolone and Ozurdex and the complications of these procedures including loss of vision, infection, cataract, glaucoma, and retinal detachment were discussed with patient.  - Specifically discussed findings from CRUISE / BRAVO study regarding patient stabilization with anti-VEGF agents and increased potential for visual improvements.  Also discussed need for frequent follow up and potentially multiple injections given the chronic nature of the disease process  - massive CME and inner retinal hyperreflectivity on OCT OS suggesting possible CRAO component OS  - BP in office 204/140 today -- worse from prior  - discussed importance of tighter control of BP  - recommend close monitoring of BP by PCP  - S/P IVA OS #1 (02.11.19)  -  mild interval improvement in CME and macular edema  - F/U 2 weeks, possible injxn OU  2. BRVO w/ macular edema OD - inferior BRVO with inferior disc collaterals vs sectoral NVD OD - OCT shows central CME OD -- stable to slightly improved from prior - recommend IVA OD today, but unable to proceed due to elevated BP - F/U 2 weeks -- DFE/OCT/possible injection OU  3,4. Hypertensive retinopathy OU - discussed importance of tight BP control - BP remains 200s / 140s -- severely undertreated  5. Moderate non-proliferative diabetic retinopathy, both eyes - The incidence, risk factors for progression, natural history and treatment options for diabetic retinopathy were discussed with patient.   - The need for close monitoring of blood glucose, blood pressure, and serum lipids, avoiding cigarette or any type of tobacco, and the need for long term follow up was also discussed with patient. - DM2 fairly well controlled, but likely contributing to #1,2 above  6. Combined form age-related cataract OU-  - The symptoms of cataract, surgical options, and treatments and risks were discussed with patient. - discussed diagnosis and progression - not yet visually significant - monitor for now   Ophthalmic Meds Ordered this visit:  No orders of the defined types were placed in this encounter.      Return in about 10 days (around 03/18/2017) for F/U CRVO w/ severe ME OS.  There are no Patient Instructions on file for this visit.   Explained the diagnoses, plan, and follow up with the patient and they expressed understanding.  Patient expressed understanding of the importance of proper follow up care.   This document serves as a record of services personally performed by Karie Chimera, MD, PhD. It was created on their behalf by Virgilio Belling, COA, a certified ophthalmic assistant. The creation of this record is the provider's dictation and/or activities during the visit.  Electronically signed by:  Virgilio Belling, COA  03/08/17 12:23 PM    Karie Chimera, M.D., Ph.D. Diseases &  Surgery of the Retina and Vitreous [facility] 03/08/17   I have reviewed the above documentation for accuracy and completeness, and I agree with the above. Karie Chimera, M.D., Ph.D. 03/08/17 12:26 PM     Abbreviations: M myopia (nearsighted); A astigmatism; H hyperopia (farsighted); P presbyopia; Mrx spectacle prescription;  CTL contact lenses; OD right eye; OS left eye; OU both eyes  XT exotropia; ET esotropia; PEK punctate epithelial keratitis; PEE punctate epithelial erosions; DES dry eye syndrome; MGD meibomian gland dysfunction; ATs artificial tears; PFAT's preservative free artificial tears; NSC nuclear sclerotic cataract; PSC posterior subcapsular cataract; ERM epi-retinal membrane; PVD posterior vitreous detachment; RD retinal detachment; DM diabetes mellitus; DR diabetic retinopathy; NPDR non-proliferative diabetic retinopathy; PDR proliferative diabetic retinopathy; CSME clinically significant macular edema; DME diabetic macular edema; dbh dot blot hemorrhages; CWS cotton wool spot; POAG primary open angle glaucoma; C/D cup-to-disc ratio; HVF humphrey visual field; GVF goldmann visual field; OCT optical coherence tomography; IOP intraocular pressure; BRVO Branch retinal vein occlusion; CRVO central retinal vein occlusion; CRAO central retinal artery occlusion; BRAO branch retinal artery occlusion; RT retinal tear; SB scleral buckle; PPV pars plana vitrectomy; VH Vitreous hemorrhage; PRP panretinal laser photocoagulation; IVK intravitreal kenalog; VMT vitreomacular traction; MH Macular hole;  NVD neovascularization of the disc; NVE neovascularization elsewhere; AREDS age related eye disease study; ARMD age related macular degeneration; POAG primary open angle glaucoma; EBMD epithelial/anterior basement membrane dystrophy; ACIOL anterior chamber intraocular lens; IOL intraocular lens;  PCIOL posterior chamber intraocular lens; Phaco/IOL phacoemulsification with intraocular lens placement; PRK photorefractive keratectomy; LASIK laser assisted in situ keratomileusis; HTN hypertension; DM diabetes mellitus; COPD chronic obstructive pulmonary disease

## 2017-03-08 ENCOUNTER — Encounter (INDEPENDENT_AMBULATORY_CARE_PROVIDER_SITE_OTHER): Payer: Self-pay | Admitting: Ophthalmology

## 2017-03-08 ENCOUNTER — Ambulatory Visit (INDEPENDENT_AMBULATORY_CARE_PROVIDER_SITE_OTHER): Payer: Self-pay | Admitting: Ophthalmology

## 2017-03-08 VITALS — BP 204/140

## 2017-03-08 DIAGNOSIS — H34812 Central retinal vein occlusion, left eye, with macular edema: Secondary | ICD-10-CM

## 2017-03-08 DIAGNOSIS — E113313 Type 2 diabetes mellitus with moderate nonproliferative diabetic retinopathy with macular edema, bilateral: Secondary | ICD-10-CM

## 2017-03-08 DIAGNOSIS — H35033 Hypertensive retinopathy, bilateral: Secondary | ICD-10-CM

## 2017-03-08 DIAGNOSIS — I10 Essential (primary) hypertension: Secondary | ICD-10-CM

## 2017-03-08 DIAGNOSIS — H34831 Tributary (branch) retinal vein occlusion, right eye, with macular edema: Secondary | ICD-10-CM

## 2017-03-08 DIAGNOSIS — H25813 Combined forms of age-related cataract, bilateral: Secondary | ICD-10-CM

## 2017-03-16 NOTE — Progress Notes (Signed)
[facility] - Clinic Note  03/17/2017     CHIEF COMPLAINT Patient presents for Retina Follow Up   HISTORY OF PRESENT ILLNESS: Kevin [name] is a 52 y.o. male who presents to the clinic today for:   HPI    Retina Follow Up    Patient presents with  Other.  In left eye.  This started 3 months ago.  Severity is mild.  Since onset it is stable.  I, the attending physician,  performed the HPI with the patient and updated documentation appropriately.          Comments    F/U CRVO W/severe ME OS. Patient states his vision is a little better, cloudy vision remains, sun light bothers his eyes if he does not wear sun glasses.BS 130 this am , unsure of last A1C. Bp 202/132 X2 DR. Markus [name] aware.Dr Patient does not have PCP at this time,he is attempting to get one.        Last edited by Kevin Chris, MD on 03/17/2017 10:58 PM. (History)    Pt states he is in search of a new PCP, states he has not found anyone yet; Pt states that his PCP is no longer in practice and has been unable to find one. Awaiting Medicaid card. No changes in medications since last visit. Mother states BP was 190/130s this morning.   Referring physician: Billee Cashing, MD [address] [place], [place] [address]  HISTORICAL INFORMATION:   Selected notes from the MEDICAL RECORD NUMBER Referred by Dr. Aletta [name] for concern of decrease VA OU, OS>OD;  LEE- unknown [BCVA OD: 20/60 OS: 20/400] Ocular Hx-  PMH- DM, HTN, current smoker    CURRENT MEDICATIONS: No current outpatient medications on file. (Ophthalmic Drugs)   No current facility-administered medications for this visit.  (Ophthalmic Drugs)   Current Outpatient Medications (Other)  Medication Sig  . acetaminophen (TYLENOL) 325 MG tablet Take 2 tablets (650 mg total) by mouth every 6 (six) hours as needed (pain).  Marland Kitchen amLODipine (NORVASC) 2.5 MG tablet Take 4 tablets (10 mg total) by mouth daily.  . cyclobenzaprine (FLEXERIL) 5 MG  tablet Take 1 tablet (5 mg total) by mouth 2 (two) times daily as needed for muscle spasms.  Marland Kitchen HYDROcodone-acetaminophen (NORCO) 5-325 MG per tablet Take 1-2 tablets by mouth every 6 (six) hours as needed for pain.  Marland Kitchen lisinopril-hydrochlorothiazide (PRINZIDE,ZESTORETIC) 10-12.5 MG tablet Take 2 tablets by mouth daily.   . metFORMIN (GLUCOPHAGE) 1000 MG tablet Take 1,000 mg by mouth 2 (two) times daily.   Current Facility-Administered Medications (Other)  Medication Route  . Bevacizumab (AVASTIN) SOLN 1.25 mg Intravitreal      REVIEW OF SYSTEMS: ROS    Positive for: Endocrine, Eyes   Negative for: Constitutional, Gastrointestinal, Neurological, Skin, Genitourinary, Musculoskeletal, HENT, Cardiovascular, Respiratory, Psychiatric, Allergic/Imm, Heme/Lymph   Last edited by Kevin Scot, LPN on 09/11/4780  9:49 AM. (History)       ALLERGIES No Known Allergies  PAST MEDICAL HISTORY Past Medical History:  Diagnosis Date  . Diabetes mellitus without complication (HCC)   . Hypertension    History reviewed. No pertinent surgical history.  FAMILY HISTORY Family History  Problem Relation Age of Onset  . Diabetes Mother   . Hypertension Mother   . Hypertension Father   . Diabetes Maternal Grandmother   . Hypertension Maternal Grandmother   . Arthritis Maternal Grandmother     SOCIAL HISTORY Social History   Tobacco Use  .  Smoking status: Current Every Day Smoker    Packs/day: 2.00  . Smokeless tobacco: Never Used  Substance Use Topics  . Alcohol use: Yes  . Drug use: Yes    Types: Marijuana         OPHTHALMIC EXAM:  Base Eye Exam    Visual Acuity (Snellen - Linear)      Right Left   Dist Kevin [name] 20/100 -1 CF at 2'   Dist ph Kevin [name] 20/50 +2 NI       Tonometry (Applanation, 9:40 AM)      Right Left   Pressure 18 16       Pupils      Dark Light Shape React APD   Right 4 3 Round Slow None   Left 4 4 Round Minimal Trace       Visual Fields (Counting fingers)       Left Right    Full Full  VF defect in th past OS. Did not find defect today.       Extraocular Movement      Right Left    Full, Ortho Full, Ortho       Neuro/Psych    Oriented x3:  Yes   Mood/Affect:  Normal       Dilation    Both eyes:  1.0% Mydriacyl, 2.5% Phenylephrine @ 9:40 AM        Slit Lamp and Fundus Exam    Slit Lamp Exam      Right Left   Lids/Lashes Meibomian gland dysfunction Meibomian gland dysfunction   Conjunctiva/Sclera Melanosis Melanosis   Cornea Mild Arcus, mild Debris in tear film Mild Arcus, mild Debris in tear film   Anterior Chamber Deep and quiet Deep and quiet   Iris Round and dilated, No NVI Round and dilated, No NVI   Lens 2+ Nuclear sclerosis, 2+ Cortical cataract 2+ Nuclear sclerosis, 2+ Cortical cataract   Vitreous Normal Normal       Fundus Exam      Right Left   Disc Inferior NVD/collaterals 2-3+ Optic disc edema, 360 heme   C/D Ratio 0.35 0.2   Macula Blunted foveal reflex, +CME, inferior IRH and flame hemorrhages  massive swelling and IRH - mild improvement in hemorrhage   Vessels Dilation, Tortuous, inferior sclerotic arteriorls, inferior BRVO, Copper wiring CRVO; Copper wiring, sclerotic vessles, Vascular attenuation, Dilation, Tortuous   Periphery Attached; inferior BRVO with IRH Confluent hemorrhage 360          IMAGING AND PROCEDURES  Imaging and Procedures for 03/17/17  OCT, Retina - OU - Both Eyes     Right Eye Quality was good. Central Foveal Thickness: 692. Progression has worsened. Findings include abnormal foveal contour, subretinal fluid, intraretinal fluid, outer retinal atrophy.   Left Eye Quality was good. Central Foveal Thickness: 561. Progression has improved. Findings include intraretinal fluid, intraretinal hyper-reflective material, abnormal foveal contour, subretinal fluid (Massive CME w/ SRF).   Notes *Images captured and stored on drive  Diagnosis / Impression:  CME and SRF OU, OS >>>  OD Persistent massive edema OU  Clinical management:  See below  Abbreviations: NFP - Normal foveal profile. CME - cystoid macular edema. PED - pigment epithelial detachment. IRF - intraretinal fluid. SRF - subretinal fluid. EZ - ellipsoid zone. ERM - epiretinal membrane. ORA - outer retinal atrophy. ORT - outer retinal tubulation. SRHM - subretinal hyper-reflective material                 ASSESSMENT/PLAN:  ICD-10-CM   1. Central retinal vein occlusion with macular edema of left eye H34.8120 OCT, Retina - OU - Both Eyes  2. Branch retinal vein occlusion of right eye with macular edema H34.8310   3. Essential hypertension I10   4. Hypertensive retinopathy of both eyes H35.033   5. Moderate nonproliferative diabetic retinopathy of both eyes with macular edema associated with type 2 diabetes mellitus (HCC) Z61.0960E11.3313   6. Combined forms of age-related cataract of both eyes H25.813   7. Patient noncompliance Z91.19     1. CRVO with severe macular edema OS-   - The natural history of retinal vein occlusion and macular edema and treatment options including observation, laser photocoagulation, and intravitreal antiVEGF injection with Avastin and Lucentis and Eylea and intravitreal injection of steroids with triamcinolone and Ozurdex and the complications of these procedures including loss of vision, infection, cataract, glaucoma, and retinal detachment were discussed with patient.  - massive CME and inner retinal hyperreflectivity on OCT OS suggesting possible CRAO component OS  - BP in office 200s/130s today -- remains very high  - S/P IVA OS #1 (02.11.19) -- mild improvement, but still with massive residual edema  - discussed increased risk of cardiovascular event with anti-VEGF therapy  - discussed that I will be unable to treat him until his BP better managed as he is high risk for stroke / MI with his current BP  - stressed severity of his disease and need for treatment  - advised  pt go directly to ED for BP evaluation and management  - no follow up until BP is better controlled  - patient verbalized understanding of recommendations and will call back for follow up when BP is under better control  2. BRVO w/ macular edema OD - inferior BRVO with inferior disc collaterals vs sectoral NVD OD - OCT shows central CME OD -- stable to slightly worse from prior - recommend IVA OD today, but unable to proceed due to elevated BP - F/U when BP is controlled -- see above  3,4. Hypertensive retinopathy OU - discussed importance of tight BP control - BP remains 200s / 140s -- severely undertreated  5. Moderate non-proliferative diabetic retinopathy, both eyes - The incidence, risk factors for progression, natural history and treatment options for diabetic retinopathy were discussed with patient.   - The need for close monitoring of blood glucose, blood pressure, and serum lipids, avoiding cigarette or any type of tobacco, and the need for long term follow up was also discussed with patient. - DM2 fairly well controlled, but likely contributing to #1,2 above  6. Combined form age-related cataract OU-  - The symptoms of cataract, surgical options, and treatments and risks were discussed with patient. - discussed diagnosis and progression - not yet visually significant - monitor for now  7. Severe patient noncompliance - pt disinterested in his own healthcare--his mother brings him to all his appointments - regarding his BP, pt is more concerned about smoking and eating sausage and bacon daily than the fact that his BP remains dangerously high - appears to lack motivation to find a PCP and get health in order - at each visit, I have explained the severity of his vision loss and that I can't safely treat him while his BP remains so elevated -- yet he has done nothing to lower his BP     Ophthalmic Meds Ordered this visit:  No orders of the defined types were placed in this  encounter.  No Follow-up on file.  There are no Patient Instructions on file for this visit.   Explained the diagnoses, plan, and follow up with the patient and they expressed understanding.  Patient expressed understanding of the importance of proper follow up care.   This document serves as a record of services personally performed by Karie Chimera, MD, PhD. It was created on their behalf by Virgilio Belling, COA, a certified ophthalmic assistant. The creation of this record is the provider's dictation and/or activities during the visit.  Electronically signed by: Virgilio Belling, COA  03/17/17 11:15 PM   Karie Chimera, M.D., Ph.D. Diseases & Surgery of the Retina and Vitreous [facility] 03/17/17  I have reviewed the above documentation for accuracy and completeness, and I agree with the above. Karie Chimera, M.D., Ph.D. 03/17/17 11:15 PM     Abbreviations: M myopia (nearsighted); A astigmatism; H hyperopia (farsighted); P presbyopia; Mrx spectacle prescription;  CTL contact lenses; OD right eye; OS left eye; OU both eyes  XT exotropia; ET esotropia; PEK punctate epithelial keratitis; PEE punctate epithelial erosions; DES dry eye syndrome; MGD meibomian gland dysfunction; ATs artificial tears; PFAT's preservative free artificial tears; NSC nuclear sclerotic cataract; PSC posterior subcapsular cataract; ERM epi-retinal membrane; PVD posterior vitreous detachment; RD retinal detachment; DM diabetes mellitus; DR diabetic retinopathy; NPDR non-proliferative diabetic retinopathy; PDR proliferative diabetic retinopathy; CSME clinically significant macular edema; DME diabetic macular edema; dbh dot blot hemorrhages; CWS cotton wool spot; POAG primary open angle glaucoma; C/D cup-to-disc ratio; HVF humphrey visual field; GVF goldmann visual field; OCT optical coherence tomography; IOP intraocular pressure; BRVO Branch retinal vein occlusion; CRVO central retinal  vein occlusion; CRAO central retinal artery occlusion; BRAO branch retinal artery occlusion; RT retinal tear; SB scleral buckle; PPV pars plana vitrectomy; VH Vitreous hemorrhage; PRP panretinal laser photocoagulation; IVK intravitreal kenalog; VMT vitreomacular traction; MH Macular hole;  NVD neovascularization of the disc; NVE neovascularization elsewhere; AREDS age related eye disease study; ARMD age related macular degeneration; POAG primary open angle glaucoma; EBMD epithelial/anterior basement membrane dystrophy; ACIOL anterior chamber intraocular lens; IOL intraocular lens; PCIOL posterior chamber intraocular lens; Phaco/IOL phacoemulsification with intraocular lens placement; PRK photorefractive keratectomy; LASIK laser assisted in situ keratomileusis; HTN hypertension; DM diabetes mellitus; COPD chronic obstructive pulmonary disease

## 2017-03-17 ENCOUNTER — Encounter (INDEPENDENT_AMBULATORY_CARE_PROVIDER_SITE_OTHER): Payer: Self-pay | Admitting: Ophthalmology

## 2017-03-17 ENCOUNTER — Ambulatory Visit (INDEPENDENT_AMBULATORY_CARE_PROVIDER_SITE_OTHER): Payer: Self-pay | Admitting: Ophthalmology

## 2017-03-17 VITALS — BP 202/132

## 2017-03-17 DIAGNOSIS — H34812 Central retinal vein occlusion, left eye, with macular edema: Secondary | ICD-10-CM

## 2017-03-17 DIAGNOSIS — E113313 Type 2 diabetes mellitus with moderate nonproliferative diabetic retinopathy with macular edema, bilateral: Secondary | ICD-10-CM

## 2017-03-17 DIAGNOSIS — Z91199 Patient's noncompliance with other medical treatment and regimen due to unspecified reason: Secondary | ICD-10-CM

## 2017-03-17 DIAGNOSIS — Z9119 Patient's noncompliance with other medical treatment and regimen: Secondary | ICD-10-CM

## 2017-03-17 DIAGNOSIS — H34831 Tributary (branch) retinal vein occlusion, right eye, with macular edema: Secondary | ICD-10-CM

## 2017-03-17 DIAGNOSIS — I10 Essential (primary) hypertension: Secondary | ICD-10-CM

## 2017-03-17 DIAGNOSIS — H25813 Combined forms of age-related cataract, bilateral: Secondary | ICD-10-CM

## 2017-03-17 DIAGNOSIS — H35033 Hypertensive retinopathy, bilateral: Secondary | ICD-10-CM

## 2019-11-29 ENCOUNTER — Encounter (HOSPITAL_COMMUNITY): Payer: Self-pay | Admitting: Internal Medicine

## 2019-11-29 ENCOUNTER — Inpatient Hospital Stay (HOSPITAL_COMMUNITY)
Admission: EM | Admit: 2019-11-29 | Discharge: 2019-12-01 | DRG: 304 | Discharge status: Against Medical Advice | Payer: Self-pay | Attending: Internal Medicine | Admitting: Internal Medicine

## 2019-11-29 ENCOUNTER — Emergency Department (HOSPITAL_COMMUNITY): Payer: Self-pay

## 2019-11-29 ENCOUNTER — Inpatient Hospital Stay (HOSPITAL_COMMUNITY): Payer: Self-pay

## 2019-11-29 DIAGNOSIS — I130 Hypertensive heart and chronic kidney disease with heart failure and stage 1 through stage 4 chronic kidney disease, or unspecified chronic kidney disease: Secondary | ICD-10-CM | POA: Diagnosis present

## 2019-11-29 DIAGNOSIS — N183 Chronic kidney disease, stage 3 unspecified: Secondary | ICD-10-CM | POA: Diagnosis present

## 2019-11-29 DIAGNOSIS — I5043 Acute on chronic combined systolic (congestive) and diastolic (congestive) heart failure: Secondary | ICD-10-CM | POA: Diagnosis present

## 2019-11-29 DIAGNOSIS — I5021 Acute systolic (congestive) heart failure: Secondary | ICD-10-CM

## 2019-11-29 DIAGNOSIS — N179 Acute kidney failure, unspecified: Secondary | ICD-10-CM | POA: Diagnosis present

## 2019-11-29 DIAGNOSIS — R000 Tachycardia, unspecified: Secondary | ICD-10-CM | POA: Diagnosis present

## 2019-11-29 DIAGNOSIS — E1122 Type 2 diabetes mellitus with diabetic chronic kidney disease: Secondary | ICD-10-CM | POA: Diagnosis present

## 2019-11-29 DIAGNOSIS — F17200 Nicotine dependence, unspecified, uncomplicated: Secondary | ICD-10-CM | POA: Diagnosis present

## 2019-11-29 DIAGNOSIS — Z9114 Patient's other noncompliance with medication regimen: Secondary | ICD-10-CM

## 2019-11-29 DIAGNOSIS — Z833 Family history of diabetes mellitus: Secondary | ICD-10-CM

## 2019-11-29 DIAGNOSIS — R7989 Other specified abnormal findings of blood chemistry: Secondary | ICD-10-CM | POA: Diagnosis present

## 2019-11-29 DIAGNOSIS — Z7984 Long term (current) use of oral hypoglycemic drugs: Secondary | ICD-10-CM

## 2019-11-29 DIAGNOSIS — Z9119 Patient's noncompliance with other medical treatment and regimen: Secondary | ICD-10-CM

## 2019-11-29 DIAGNOSIS — F17210 Nicotine dependence, cigarettes, uncomplicated: Secondary | ICD-10-CM | POA: Diagnosis present

## 2019-11-29 DIAGNOSIS — Z79899 Other long term (current) drug therapy: Secondary | ICD-10-CM

## 2019-11-29 DIAGNOSIS — F1210 Cannabis abuse, uncomplicated: Secondary | ICD-10-CM | POA: Diagnosis present

## 2019-11-29 DIAGNOSIS — E0590 Thyrotoxicosis, unspecified without thyrotoxic crisis or storm: Secondary | ICD-10-CM | POA: Diagnosis present

## 2019-11-29 DIAGNOSIS — E876 Hypokalemia: Secondary | ICD-10-CM | POA: Diagnosis present

## 2019-11-29 DIAGNOSIS — I161 Hypertensive emergency: Principal | ICD-10-CM | POA: Diagnosis present

## 2019-11-29 DIAGNOSIS — E1165 Type 2 diabetes mellitus with hyperglycemia: Secondary | ICD-10-CM | POA: Diagnosis present

## 2019-11-29 DIAGNOSIS — R809 Proteinuria, unspecified: Secondary | ICD-10-CM | POA: Diagnosis present

## 2019-11-29 DIAGNOSIS — N289 Disorder of kidney and ureter, unspecified: Secondary | ICD-10-CM

## 2019-11-29 DIAGNOSIS — I429 Cardiomyopathy, unspecified: Secondary | ICD-10-CM | POA: Diagnosis present

## 2019-11-29 DIAGNOSIS — Z8249 Family history of ischemic heart disease and other diseases of the circulatory system: Secondary | ICD-10-CM

## 2019-11-29 DIAGNOSIS — R519 Headache, unspecified: Secondary | ICD-10-CM | POA: Diagnosis not present

## 2019-11-29 DIAGNOSIS — H5462 Unqualified visual loss, left eye, normal vision right eye: Secondary | ICD-10-CM | POA: Diagnosis present

## 2019-11-29 DIAGNOSIS — J9601 Acute respiratory failure with hypoxia: Secondary | ICD-10-CM | POA: Diagnosis present

## 2019-11-29 DIAGNOSIS — Z20822 Contact with and (suspected) exposure to covid-19: Secondary | ICD-10-CM | POA: Diagnosis present

## 2019-11-29 DIAGNOSIS — E119 Type 2 diabetes mellitus without complications: Secondary | ICD-10-CM

## 2019-11-29 HISTORY — DX: Blindness, one eye, unspecified eye: H54.40

## 2019-11-29 LAB — CBC WITH DIFFERENTIAL/PLATELET
Abs Immature Granulocytes: 0.08 10*3/uL — ABNORMAL HIGH (ref 0.00–0.07)
Basophils Absolute: 0.1 10*3/uL (ref 0.0–0.1)
Basophils Relative: 0 %
Eosinophils Absolute: 0.2 10*3/uL (ref 0.0–0.5)
Eosinophils Relative: 1 %
HCT: 48.4 % (ref 39.0–52.0)
Hemoglobin: 14.6 g/dL (ref 13.0–17.0)
Immature Granulocytes: 1 %
Lymphocytes Relative: 36 %
Lymphs Abs: 5.1 10*3/uL — ABNORMAL HIGH (ref 0.7–4.0)
MCH: 28.4 pg (ref 26.0–34.0)
MCHC: 30.2 g/dL (ref 30.0–36.0)
MCV: 94.2 fL (ref 80.0–100.0)
Monocytes Absolute: 0.7 10*3/uL (ref 0.1–1.0)
Monocytes Relative: 5 %
Neutro Abs: 8.2 10*3/uL — ABNORMAL HIGH (ref 1.7–7.7)
Neutrophils Relative %: 57 %
Platelets: 151 10*3/uL (ref 150–400)
RBC: 5.14 MIL/uL (ref 4.22–5.81)
RDW: 13.2 % (ref 11.5–15.5)
WBC: 14.3 10*3/uL — ABNORMAL HIGH (ref 4.0–10.5)
nRBC: 0 % (ref 0.0–0.2)

## 2019-11-29 LAB — LIPID PANEL
Cholesterol: 161 mg/dL (ref 0–200)
HDL: 51 mg/dL (ref >40)
LDL Cholesterol: 100 mg/dL — ABNORMAL HIGH (ref 0–99)
Total CHOL/HDL Ratio: 3.2 RATIO
Triglycerides: 50 mg/dL (ref <150)
VLDL: 10 mg/dL (ref 0–40)

## 2019-11-29 LAB — COMPREHENSIVE METABOLIC PANEL
ALT: 45 U/L — ABNORMAL HIGH (ref 0–44)
AST: 48 U/L — ABNORMAL HIGH (ref 15–41)
Albumin: 3.7 g/dL (ref 3.5–5.0)
Alkaline Phosphatase: 94 U/L (ref 38–126)
Anion gap: 13 (ref 5–15)
BUN: 18 mg/dL (ref 6–20)
CO2: 27 mmol/L (ref 22–32)
Calcium: 9 mg/dL (ref 8.9–10.3)
Chloride: 100 mmol/L (ref 98–111)
Creatinine, Ser: 1.85 mg/dL — ABNORMAL HIGH (ref 0.61–1.24)
GFR, Estimated: 42 mL/min — ABNORMAL LOW (ref >60)
Glucose, Bld: 350 mg/dL — ABNORMAL HIGH (ref 70–99)
Potassium: 2.6 mmol/L — CL (ref 3.5–5.1)
Sodium: 140 mmol/L (ref 135–145)
Total Bilirubin: 0.6 mg/dL (ref 0.3–1.2)
Total Protein: 6.8 g/dL (ref 6.5–8.1)

## 2019-11-29 LAB — RAPID URINE DRUG SCREEN, HOSP PERFORMED
Amphetamines: NOT DETECTED
Barbiturates: NOT DETECTED
Benzodiazepines: NOT DETECTED
Cocaine: NOT DETECTED
Opiates: NOT DETECTED
Tetrahydrocannabinol: POSITIVE — AB

## 2019-11-29 LAB — I-STAT ARTERIAL BLOOD GAS, ED
Acid-base deficit: 5 mmol/L — ABNORMAL HIGH (ref 0.0–2.0)
Bicarbonate: 23.2 mmol/L (ref 20.0–28.0)
Calcium, Ion: 1.16 mmol/L (ref 1.15–1.40)
HCT: 44 % (ref 39.0–52.0)
Hemoglobin: 15 g/dL (ref 13.0–17.0)
O2 Saturation: 87 %
Potassium: 2.7 mmol/L — CL (ref 3.5–5.1)
Sodium: 137 mmol/L (ref 135–145)
TCO2: 25 mmol/L (ref 22–32)
pCO2 arterial: 53.4 mmHg — ABNORMAL HIGH (ref 32.0–48.0)
pH, Arterial: 7.246 — ABNORMAL LOW (ref 7.350–7.450)
pO2, Arterial: 63 mmHg — ABNORMAL LOW (ref 83.0–108.0)

## 2019-11-29 LAB — GLUCOSE, CAPILLARY
Glucose-Capillary: 138 mg/dL — ABNORMAL HIGH (ref 70–99)
Glucose-Capillary: 171 mg/dL — ABNORMAL HIGH (ref 70–99)

## 2019-11-29 LAB — HEMOGLOBIN A1C
Hgb A1c MFr Bld: 6.7 % — ABNORMAL HIGH (ref 4.8–5.6)
Mean Plasma Glucose: 145.59 mg/dL

## 2019-11-29 LAB — TSH: TSH: 0.162 u[IU]/mL — ABNORMAL LOW (ref 0.350–4.500)

## 2019-11-29 LAB — RESPIRATORY PANEL BY RT PCR (FLU A&B, COVID)
Influenza A by PCR: NEGATIVE
Influenza B by PCR: NEGATIVE
SARS Coronavirus 2 by RT PCR: NEGATIVE

## 2019-11-29 LAB — BRAIN NATRIURETIC PEPTIDE: B Natriuretic Peptide: 1278.7 pg/mL — ABNORMAL HIGH (ref 0.0–100.0)

## 2019-11-29 LAB — ECHOCARDIOGRAM COMPLETE
Area-P 1/2: 3.42 cm2
Calc EF: 19.3 %
S' Lateral: 6.3 cm
Single Plane A2C EF: 19.4 %
Single Plane A4C EF: 25.6 %

## 2019-11-29 LAB — TROPONIN I (HIGH SENSITIVITY)
Troponin I (High Sensitivity): 675 ng/L (ref <18)
Troponin I (High Sensitivity): 505 ng/L (ref <18)

## 2019-11-29 LAB — CBG MONITORING, ED: Glucose-Capillary: 167 mg/dL — ABNORMAL HIGH (ref 70–99)

## 2019-11-29 LAB — HIV ANTIBODY (ROUTINE TESTING W REFLEX): HIV Screen 4th Generation wRfx: NONREACTIVE

## 2019-11-29 IMAGING — DX DG CHEST 1V PORT
1 series · 1 of 1 positions shown · non-contrast
Comparison: None available.

CLINICAL DATA: A initial evaluation for acute shortness of breath.

EXAM:
PORTABLE CHEST 1 VIEW

[chest ap]
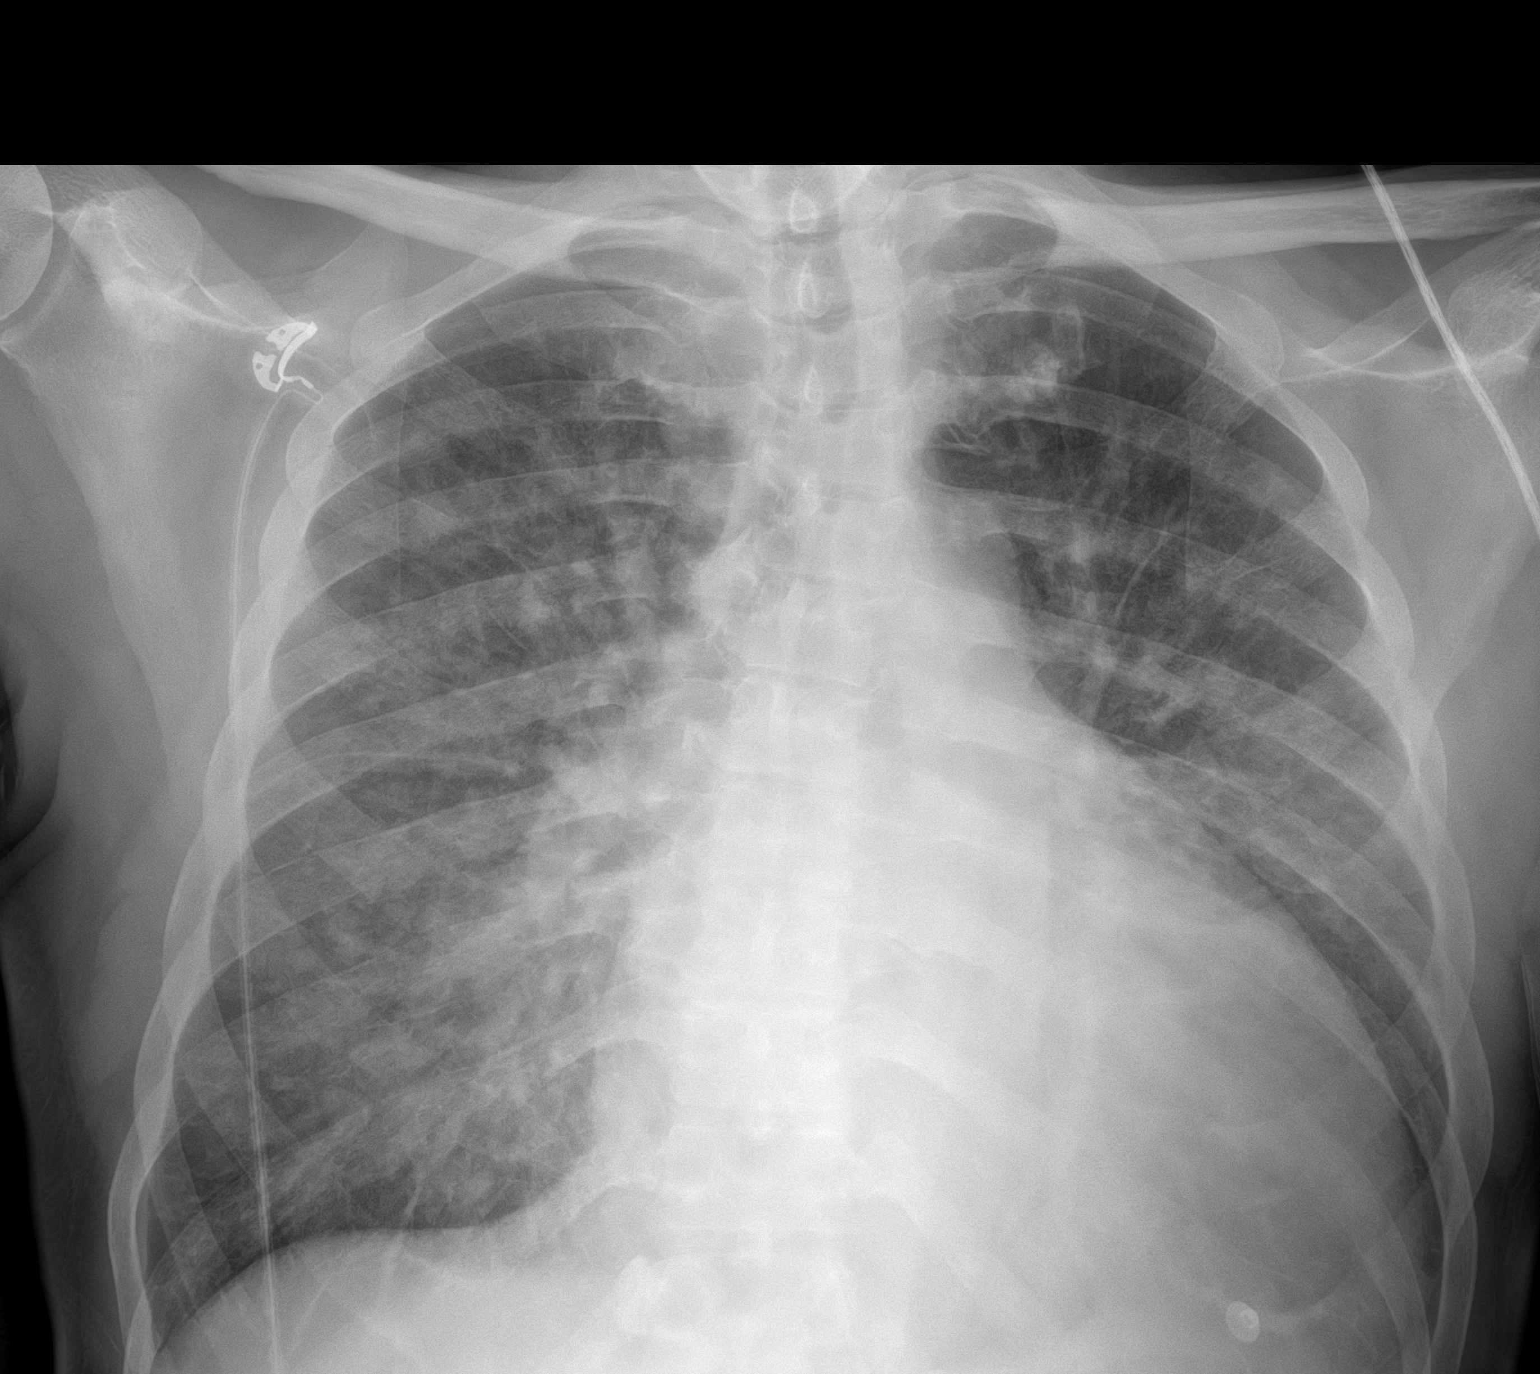

[1 of 1 positions shown; findings below may reference images not displayed]

FINDINGS: Advanced cardiomegaly.  Mediastinal silhouette within normal limits.

Lungs well inflated. Diffuse pulmonary vascular congestion with
bilateral airspace disease, favored to reflect moderate diffuse
pulmonary edema. Probable superimposed small left pleural effusion.
Multifocal infection difficult to exclude, and could be considered
in the correct clinical setting. No pneumothorax.

No acute osseous finding.  Thoracolumbar scoliosis noted.
IMPRESSION: 1. Cardiomegaly with diffuse vascular congestion and bilateral
airspace opacities, favored to reflect moderate diffuse pulmonary
edema. Multifocal infection is difficult to exclude, and could be
considered in the correct clinical setting.
2. Suspected small left pleural effusion.

## 2019-11-29 MED ORDER — HEPARIN (PORCINE) 25000 UT/250ML-% IV SOLN
1100.0000 [IU]/h | INTRAVENOUS | Status: AC
  Administered 2019-11-29: 1100 [IU]/h via INTRAVENOUS
  Filled 2019-11-29 (×2): qty 250

## 2019-11-29 MED ORDER — SODIUM CHLORIDE 0.9 % IV SOLN: 250.0000 mL | INTRAVENOUS | Status: DC | PRN

## 2019-11-29 MED ORDER — ATORVASTATIN CALCIUM 40 MG PO TABS
40.0000 mg | ORAL_TABLET | Freq: Every day | ORAL | Status: DC
  Administered 2019-11-29 – 2019-12-01 (×3): 40 mg via ORAL
  Filled 2019-11-29 (×3): qty 1

## 2019-11-29 MED ORDER — ENOXAPARIN SODIUM 40 MG/0.4ML ~~LOC~~ SOLN
40.0000 mg | SUBCUTANEOUS | Status: DC
  Administered 2019-11-29: 40 mg via SUBCUTANEOUS
  Filled 2019-11-29: qty 0.4

## 2019-11-29 MED ORDER — POTASSIUM CHLORIDE 10 MEQ/100ML IV SOLN
10.0000 meq | INTRAVENOUS | Status: AC
  Administered 2019-11-29 (×2): 10 meq via INTRAVENOUS
  Filled 2019-11-29 (×2): qty 100

## 2019-11-29 MED ORDER — NICOTINE 14 MG/24HR TD PT24
14.0000 mg | MEDICATED_PATCH | Freq: Every day | TRANSDERMAL | Status: DC
  Administered 2019-11-30: 14 mg via TRANSDERMAL
  Filled 2019-11-29: qty 1

## 2019-11-29 MED ORDER — ISOSORBIDE MONONITRATE ER 30 MG PO TB24
30.0000 mg | ORAL_TABLET | Freq: Every day | ORAL | Status: DC
  Administered 2019-11-29 – 2019-11-30 (×2): 30 mg via ORAL
  Filled 2019-11-29 (×2): qty 1

## 2019-11-29 MED ORDER — ACETAMINOPHEN 325 MG PO TABS: 650.0000 mg | ORAL_TABLET | ORAL | Status: DC | PRN

## 2019-11-29 MED ORDER — ASPIRIN EC 81 MG PO TBEC
81.0000 mg | DELAYED_RELEASE_TABLET | Freq: Every day | ORAL | Status: DC
  Administered 2019-11-29 – 2019-12-01 (×3): 81 mg via ORAL
  Filled 2019-11-29 (×3): qty 1

## 2019-11-29 MED ORDER — SODIUM CHLORIDE 0.9% FLUSH
3.0000 mL | Freq: Two times a day (BID) | INTRAVENOUS | Status: DC
  Administered 2019-11-29 – 2019-12-01 (×5): 3 mL via INTRAVENOUS

## 2019-11-29 MED ORDER — SODIUM CHLORIDE 0.9% FLUSH: 3.0000 mL | INTRAVENOUS | Status: DC | PRN

## 2019-11-29 MED ORDER — LORAZEPAM 2 MG/ML IJ SOLN
1.0000 mg | Freq: Once | INTRAMUSCULAR | Status: AC
  Administered 2019-11-29: 1 mg via INTRAVENOUS
  Filled 2019-11-29: qty 1

## 2019-11-29 MED ORDER — HEPARIN BOLUS VIA INFUSION
3000.0000 [IU] | Freq: Once | INTRAVENOUS | Status: AC
  Administered 2019-11-29: 3000 [IU] via INTRAVENOUS
  Filled 2019-11-29: qty 3000

## 2019-11-29 MED ORDER — AMLODIPINE BESYLATE 10 MG PO TABS
10.0000 mg | ORAL_TABLET | Freq: Every day | ORAL | Status: DC
  Administered 2019-11-29 – 2019-12-01 (×3): 10 mg via ORAL
  Filled 2019-11-29: qty 1
  Filled 2019-11-29: qty 2
  Filled 2019-11-29: qty 1

## 2019-11-29 MED ORDER — INSULIN ASPART 100 UNIT/ML ~~LOC~~ SOLN
0.0000 [IU] | Freq: Three times a day (TID) | SUBCUTANEOUS | Status: DC
  Administered 2019-11-29: 2 [IU] via SUBCUTANEOUS
  Administered 2019-11-29 – 2019-11-30 (×2): 3 [IU] via SUBCUTANEOUS

## 2019-11-29 MED ORDER — NITROGLYCERIN IN D5W 200-5 MCG/ML-% IV SOLN
0.0000 ug/min | INTRAVENOUS | Status: DC
  Administered 2019-11-29: 5 ug/min via INTRAVENOUS
  Filled 2019-11-29 (×3): qty 250

## 2019-11-29 MED ORDER — FUROSEMIDE 10 MG/ML IJ SOLN: 40.0000 mg | Freq: Two times a day (BID) | INTRAMUSCULAR | Status: DC

## 2019-11-29 MED ORDER — INSULIN ASPART 100 UNIT/ML ~~LOC~~ SOLN: 0.0000 [IU] | Freq: Every day | SUBCUTANEOUS | Status: DC

## 2019-11-29 MED ORDER — CARVEDILOL 3.125 MG PO TABS
3.1250 mg | ORAL_TABLET | Freq: Two times a day (BID) | ORAL | Status: DC
  Administered 2019-11-29 – 2019-11-30 (×3): 3.125 mg via ORAL
  Filled 2019-11-29 (×3): qty 1

## 2019-11-29 MED ORDER — HYDRALAZINE HCL 20 MG/ML IJ SOLN
5.0000 mg | INTRAMUSCULAR | Status: DC | PRN
  Administered 2019-11-30 (×2): 5 mg via INTRAVENOUS
  Filled 2019-11-29 (×3): qty 1

## 2019-11-29 MED ORDER — ONDANSETRON HCL 4 MG/2ML IJ SOLN: 4.0000 mg | Freq: Four times a day (QID) | INTRAMUSCULAR | Status: DC | PRN

## 2019-11-29 MED ORDER — FUROSEMIDE 10 MG/ML IJ SOLN
80.0000 mg | Freq: Once | INTRAMUSCULAR | Status: AC
  Administered 2019-11-29: 80 mg via INTRAVENOUS
  Filled 2019-11-29: qty 8

## 2019-11-29 MED ORDER — POTASSIUM CHLORIDE 10 MEQ/100ML IV SOLN
INTRAVENOUS | Status: AC
  Administered 2019-11-29: 10 meq via INTRAVENOUS
  Filled 2019-11-29: qty 100

## 2019-11-29 MED ORDER — POTASSIUM CHLORIDE CRYS ER 20 MEQ PO TBCR
40.0000 meq | EXTENDED_RELEASE_TABLET | Freq: Once | ORAL | Status: AC
  Administered 2019-11-29: 40 meq via ORAL
  Filled 2019-11-29: qty 2

## 2019-11-29 NOTE — H&P (Signed)
History and Physical    Kevin [name] [place] TDV:[id number] DOB: 07/21/1965 DOA: 11/29/2019  PCP: Medicine, [facility]:  None Patient coming from:  Home - lives with his father; Kevin [name]: Mother, [phone]  Chief Complaint: SOB, hypoxia  HPI: Kevin [name] is a 54 y.o. male with medical history significant of HTN; central retinal vein occlusion; and DM presenting with SOB, hypoxia to 82% per EMS.  His mother reports that his father called her overnight and he was coughing with productive sputum and SOB and so called 911 about 11pm.  He had been with his mother earlier in the day and didn't appear to feel good (nonspecific).  She asked him to bring in bottled water from the car and he repeated asked her to "give me a little time... I'm not bothering anybody."  He seemed somewhat SOB and has had a mild cough for some time.  2 weeks ago he had an episode where he awoke from sleep with coughing.  He has not complained of LE edema.  His mother reports that she called his pharmacy to refill his prescriptions recently and he had not had them filled since March. His BP is uncontrolled and his diabetes has been "bad."  He had a televisit recently and the doctor added 3 new medications, but the patient has not been taking any medications regularly.    ED Course:  Carryover, per Dr. Margo Aye:  Patient brought to the emergency department from home for shortness of breath.  EMS report that the patient was 82% on room air. They placed him on a nonrebreather with improvement. Patient has been in distress throughout transport. He has been markedly hypertensive. No known history of heart failure or chronic lung disease. No chest pain. Pulmonary edema noted on CXR. EDP requested admission for hypertensive emergency. Received nitro drip, BIPAP with improvement.    Review of Systems: As per HPI; otherwise review of systems reviewed and negative.  Limited by BIPAP.  Ambulatory Status:   Ambulates without assistance   Past Medical History:  Diagnosis Date  . Blind left eye   . Diabetes mellitus without complication (HCC)   . Hypertension     Past Surgical History:  Procedure Laterality Date  . GYNECOMASTIA EXCISION      Social History   Socioeconomic History  . Marital status: Single    Spouse name: Not on file  . Number of children: Not on file  . Years of education: Not on file  . Highest education level: Not on file  Occupational History  . Occupation: trying to get disability  Tobacco Use  . Smoking status: Current Every Day Smoker    Packs/day: 2.00    Years: 40.00    Pack years: 80.00  . Smokeless tobacco: Never Used  Vaping Use  . Vaping Use: Some days  Substance and Sexual Activity  . Alcohol use: Yes    Comment: denies heavy use  . Drug use: Yes    Types: Marijuana  . Sexual activity: Not on file  Other Topics Concern  . Not on file  Social History Narrative  . Not on file   Social Determinants of Health   Financial Resource Strain:   . Difficulty of Paying Living Expenses: Not on file  Food Insecurity:   . Worried About Programme researcher, broadcasting/film/video in the Last Year: Not on file  . Ran Out of Food in the Last Year: Not on file  Transportation Needs:   . Lack  of Transportation (Medical): Not on file  . Lack of Transportation (Non-Medical): Not on file  Physical Activity:   . Days of Exercise per Week: Not on file  . Minutes of Exercise per Session: Not on file  Stress:   . Feeling of Stress : Not on file  Social Connections:   . Frequency of Communication with Friends and Family: Not on file  . Frequency of Social Gatherings with Friends and Family: Not on file  . Attends Religious Services: Not on file  . Active Member of Clubs or Organizations: Not on file  . Attends Banker Meetings: Not on file  . Marital Status: Not on file  Intimate Partner Violence:   . Fear of Current or Ex-Partner: Not on file  . Emotionally  Abused: Not on file  . Physically Abused: Not on file  . Sexually Abused: Not on file    No Known Allergies  Family History  Problem Relation Age of Onset  . Diabetes Mother   . Hypertension Mother   . Hypertension Father   . Diabetes Maternal Grandmother   . Hypertension Maternal Grandmother   . Arthritis Maternal Grandmother   . Congestive Heart Failure Neg Hx     Prior to Admission medications   Medication Sig Start Date End Date Taking? Authorizing Provider  acetaminophen (TYLENOL) 325 MG tablet Take 2 tablets (650 mg total) by mouth every 6 (six) hours as needed (pain). 10/21/16   Caccavale, Sophia, PA-C  amLODipine (NORVASC) 2.5 MG tablet Take 4 tablets (10 mg total) by mouth daily. 04/08/12   Linwood Dibbles, MD  cyclobenzaprine (FLEXERIL) 5 MG tablet Take 1 tablet (5 mg total) by mouth 2 (two) times daily as needed for muscle spasms. 10/21/16   Caccavale, Sophia, PA-C  HYDROcodone-acetaminophen (NORCO) 5-325 MG per tablet Take 1-2 tablets by mouth every 6 (six) hours as needed for pain. 04/08/12   Linwood Dibbles, MD  lisinopril-hydrochlorothiazide (PRINZIDE,ZESTORETIC) 10-12.5 MG tablet Take 2 tablets by mouth daily.  02/02/17   [provider]  metFORMIN (GLUCOPHAGE) 1000 MG tablet Take 1,000 mg by mouth 2 (two) times daily. 02/02/17   [provider]    Physical Exam: Vitals:   11/29/19 1030 11/29/19 1045 11/29/19 1100 11/29/19 1115  BP: (!) 137/95 138/87 (!) 142/95 (!) 137/95  Pulse: 71 65 68 76  Resp: 18 13 16 18   SpO2: 99% 98% 99% 100%     . General:  Appears calm and comfortable and is NAD, wearing BIPAP . Eyes:   EOMI, normal lids, iris . ENT:  grossly normal hearing, lips, BIPAP in place . Neck:  no LAD, masses or thyromegaly . Cardiovascular:  RRR, no m/r/g. No LE edema.  Respiratory:   CTA bilaterally with no wheezes/rales/rhonchi.  Normal respiratory effort. . Abdomen:  soft, NT, ND, NABS . Skin:  no rash or induration seen on limited  exam . Musculoskeletal:  grossly normal tone BUE/BLE, good ROM, no bony abnormality . Psychiatric:  grossly normal mood and affect, speech fluent and appropriate, AOx3, ?chronic cognitive impairment . Neurologic:  CN 2-12 grossly intact, moves all extremities in coordinated fashion    Radiological Exams on Admission: Independently reviewed - see discussion in A/P where applicable  DG Chest Port 1 View  Result Date: 11/29/2019 CLINICAL DATA:  A initial evaluation for acute shortness of breath. EXAM: PORTABLE CHEST 1 VIEW COMPARISON:  None available. FINDINGS: Advanced cardiomegaly.  Mediastinal silhouette within normal limits. Lungs well inflated. Diffuse pulmonary vascular congestion  with bilateral airspace disease, favored to reflect moderate diffuse pulmonary edema. Probable superimposed small left pleural effusion. Multifocal infection difficult to exclude, and could be considered in the correct clinical setting. No pneumothorax. No acute osseous finding.  Thoracolumbar scoliosis noted. IMPRESSION: 1. Cardiomegaly with diffuse vascular congestion and bilateral airspace opacities, favored to reflect moderate diffuse pulmonary edema. Multifocal infection is difficult to exclude, and could be considered in the correct clinical setting. 2. Suspected small left pleural effusion. Electronically Signed   By: Rise Mu M.D.   On: 11/29/2019 01:50    EKG: Independently reviewed.  Sinus tachycardia with rate 106; prolonged QTc 541; LVH with NSCSLT   Labs on Admission: I have personally reviewed the available labs and imaging studies at the time of the admission.  Pertinent labs:   ABG: 7.246/53.4/63/87% K+ 2.6 Glucose 350 BUN 18/Creatinine 1.85/GFR 42 - unknown baseline BNP 1278.7 HS troponin 675, 505 WBC 14.3 COVID/flu negative   Assessment/Plan Principal Problem:   Hypertensive emergency Active Problems:   Diabetes mellitus without complication (HCC)   Tobacco dependence    Marijuana abuse   Renal dysfunction   Hypokalemia   Hypertensive crisis with elevated troponin -Patient presenting with cough and SOB concerning for hypertensive crisis -He did have evidence of end organ failure, with elevated troponin; also possibly with AKI (see below) -The initial goal of therapy would generally be to decrease the MAP by no more than 25% in the first hour and then continue to decrease the BP additionally within the next 2-6 hours -The patient received NTG drip in the ER and has continued to have HTN so remains on NTG drip -He was placed on BIPAP urgently and his acute respiratory failure with hypoxia has improved; he is currently on [place] O2 -The underlying cause appears to be due to chronic suboptimal HTN control in the setting of medication noncompliance -Will admit to progressive care unit -Cardiology consultation was requested overnight -Echo is pending -He was only taking Lisinopril at home; holding this for now -Will start Norvasc and Coreg -Will add prn hydralazine  Uncontrolled DM -Will check A1c -hold Glucophage -Cover with moderate-scale SSI  Renal dysfunction -Uncertain baseline - I attempted to call his PCP office and was unable to get through and there are no prior results in Epic -Suspect that there is a chronic component from uncontrolled HTN and DM but there may also be an acute component from decreased renal perfusion in the setting of HTN crisis -Will trend  Hypokalemia -Repleted with both IV and PO KCl -Recheck BMP in AM  Tobacco dependence -Encourage cessation.   -This was discussed with the patient and should be reviewed on an ongoing basis.   -Patch ordered at patient request.  Marijuana abuse -Cessation encouraged; this should be encouraged on an ongoing basis -UDS ordered     Note: This patient has been tested and is negative for the novel coronavirus COVID-19.    DVT prophylaxis:  Lovenox  Code Status:  Full - confirmed with  patient/family Family Communication: Mother was present throughout evaluation Disposition Plan:  The patient is from: home  Anticipated d/c is to: home without [facility] services   Anticipated d/c date will depend on clinical response to treatment, but likely 2-3 days  Patient is currently: acutely ill Consults called: Cardiology; [facility] team Admission status:  Admit - It is my clinical opinion that admission to INPATIENT is reasonable and necessary because of the expectation that this patient will require hospital care that crosses at  least 2 midnights to treat this condition based on the medical complexity of the problems presented.  Given the aforementioned information, the predictability of an adverse outcome is felt to be significant.    Jonah BlueJennifer Cheyene Hamric MD [facility]   How to contact the [facility] Attending or Consulting provider 7A - 7P or covering provider during after hours 7P -7A, for this patient?  1. Check the care team in [facility] and look for a) attending/consulting [facility] provider listed and b) the [facility] team listed 2. Log into [email or web address] and use [facility] universal password to access. If you do not have the password, please contact the hospital operator. 3. Locate the [facility] provider you are looking for under [facility] and page to a number that you can be directly reached. 4. If you still have difficulty reaching the provider, please page the [facility] (Director on Call) for the Hospitalists listed on amion for assistance.   11/29/2019, 11:40 AM

## 2019-11-29 NOTE — Progress Notes (Signed)
RT took pt off BiPAP and placed pt on 2LNC. Pt tolerating well at this time.

## 2019-11-29 NOTE — ED Triage Notes (Signed)
Pt arrived via [facility] from home after pt had coughing spell and couldn't catch breath. When EMS arrived pt was 82% on RA. Pt was on non rebreather arriving to ED and 95%.

## 2019-11-29 NOTE — Progress Notes (Signed)
ReDS Clip Diuretic Study Pt study # [id number]  Your patient is in the Blinded arm of the ReDS Clip Diuretic study.  Your patient has had a ReDS reading and the reading has been transmitted to the cloud.   Thank You   The research team   Jolly Carlini Boothby, PharmD, BCPS Heart Failure Stewardship Pharmacist Phone [phone]  Please check [email or web address] for unit-specific pharmacist phone numbers  

## 2019-11-29 NOTE — Consult Note (Addendum)
Cardiology Consultation:   Patient ID: Kevin [name] [facility] MRN: [id number]; DOB: August 03, 1965  Admit date: 11/29/2019 Date of Consult: 11/29/2019  Primary Care Provider: Medicine, [facility] Cardiologist: Meriam Sprague, MD  [facility] Electrophysiologist:  None   Patient Profile:   Kevin [name] is a 54 y.o. male with a hx of HTN, central retinal vein occlusion, DM and tobacco use who is being seen today for the evaluation of elevated troponin in the setting of hypertensive emergency at the request of Dr. Ophelia Charter.  History of Present Illness:   Kevin [name] is a 54 yo male with PMH noted above.  Patient reports he had previously been on blood pressure and diabetes medications but ran out at least 6 months prior.  States he was recently restarted on these about a week ago.  Reports resuming lisinopril and Metformin.  Currently lives with his father.  States has had intermittent episodes of transient shortness of breath over the past couple weeks.  Last night he experienced a more severe episode.  States he was sitting at home on the couch with his father and had a coughing spell and became significantly short of breath.  Father became concerned and called EMS.  On their arrival he was noted to be hypoxic with O2 sats of 82% on room air.  He was started on nonrebreather with significant improvement of sats to 95%.    By the time he arrived to the ED he was noted to be in the tripod position and tachypneic.  Coughing up pink frothy sputum.  Blood pressures were noted to be significantly elevated with systolic reading of 244.  Labs showed sodium 140, potassium 2.6, creatinine 1.85, BNP 1278, high-sensitivity troponin 675>> 505, WBC 14.3.  This x-ray showed cardiomegaly with diffuse vascular congestion and bilateral pulmonary edema.  He was placed on BiPAP with significant improvement in respiratory status. EKG showed ST with LVH, biatrial enlargement. He was  admitted to IM and placed IV nitro. Echo today shows EF < 20% with global hypokinesis, grade 1 diastolic dysfunction, severe LVH and mildly dilated left atrium.    Past Medical History:  Diagnosis Date  . Blind left eye   . Diabetes mellitus without complication (HCC)   . Hypertension     Past Surgical History:  Procedure Laterality Date  . GYNECOMASTIA EXCISION       Home Medications:  Prior to Admission medications   Medication Sig Start Date End Date Taking? Authorizing Provider  lisinopril (ZESTRIL) 20 MG tablet Take 20 mg by mouth daily. 11/08/19  Yes [provider]  metFORMIN (GLUCOPHAGE) 1000 MG tablet Take 1,000 mg by mouth in the morning and at bedtime.  02/02/17  Yes [provider]    Inpatient Medications: Scheduled Meds: . amLODipine  10 mg Oral Daily  . aspirin EC  81 mg Oral Daily  . carvedilol  3.125 mg Oral BID WC  . enoxaparin (LOVENOX) injection  40 mg Subcutaneous Q24H  . furosemide  40 mg Intravenous Q12H  . insulin aspart  0-15 Units Subcutaneous TID WC  . insulin aspart  0-5 Units Subcutaneous QHS  . nicotine  14 mg Transdermal Daily  . sodium chloride flush  3 mL Intravenous Q12H   Continuous Infusions: . sodium chloride    . nitroGLYCERIN 50 mcg/min (11/29/19 1030)   PRN Meds: sodium chloride, acetaminophen, hydrALAZINE, ondansetron (ZOFRAN) IV, sodium chloride flush  Allergies:   No Known Allergies  Social History:  Social History   Socioeconomic History  . Marital status: Single    Spouse name: Not on file  . Number of children: Not on file  . Years of education: Not on file  . Highest education level: Not on file  Occupational History  . Occupation: trying to get disability  Tobacco Use  . Smoking status: Current Every Day Smoker    Packs/day: 2.00    Years: 40.00    Pack years: 80.00  . Smokeless tobacco: Never Used  Vaping Use  . Vaping Use: Some days  Substance and Sexual Activity  . Alcohol use: Yes     Comment: denies heavy use  . Drug use: Yes    Types: Marijuana  . Sexual activity: Not on file  Other Topics Concern  . Not on file  Social History Narrative  . Not on file   Social Determinants of Health   Financial Resource Strain:   . Difficulty of Paying Living Expenses: Not on file  Food Insecurity:   . Worried About Programme researcher, broadcasting/film/videounning Out of Food in the Last Year: Not on file  . Ran Out of Food in the Last Year: Not on file  Transportation Needs:   . Lack of Transportation (Medical): Not on file  . Lack of Transportation (Non-Medical): Not on file  Physical Activity:   . Days of Exercise per Week: Not on file  . Minutes of Exercise per Session: Not on file  Stress:   . Feeling of Stress : Not on file  Social Connections:   . Frequency of Communication with Friends and Family: Not on file  . Frequency of Social Gatherings with Friends and Family: Not on file  . Attends Religious Services: Not on file  . Active Member of Clubs or Organizations: Not on file  . Attends BankerClub or Organization Meetings: Not on file  . Marital Status: Not on file  Intimate Partner Violence:   . Fear of Current or Ex-Partner: Not on file  . Emotionally Abused: Not on file  . Physically Abused: Not on file  . Sexually Abused: Not on file    Family History:    Family History  Problem Relation Age of Onset  . Diabetes Mother   . Hypertension Mother   . Hypertension Father   . Diabetes Maternal Grandmother   . Hypertension Maternal Grandmother   . Arthritis Maternal Grandmother   . Congestive Heart Failure Neg Hx      ROS:  Please see the history of present illness.   All other ROS reviewed and negative.     Physical Exam/Data:   Vitals:   11/29/19 1230 11/29/19 1247 11/29/19 1331 11/29/19 1400  BP: (!) 148/98 (!) 145/96 (!) 138/100   Pulse: 79 78 78   Resp: (!) 24 17 18    Temp:  99.3 F (37.4 C) 97.8 F (36.6 C)   TempSrc:  Tympanic Oral   SpO2: 99% 99%    Weight:    78.6 kg  Height:         Intake/Output Summary (Last 24 hours) at 11/29/2019 1454 Last data filed at 11/29/2019 1301 Gross per 24 hour  Intake 100 ml  Output 1700 ml  Net -1600 ml   Last 3 Weights 11/29/2019 11/29/2019  Weight (lbs) 173 lb 4.5 oz 195 lb  Weight (kg) 78.6 kg 88.451 kg     Body mass index is 22.25 kg/m.  General:  Well nourished, well developed, in no acute distress HEENT: normal Lymph: no adenopathy  Neck: no JVD Endocrine:  No thryomegaly Vascular: No carotid bruits; FA pulses 2+ bilaterally without bruits  Cardiac:  normal S1, S2, + s3?; RRR; no murmur  Lungs:  clear to auscultation bilaterally, no wheezing, rhonchi or rales  Abd: soft, nontender, no hepatomegaly  Ext: no edema Musculoskeletal:  No deformities, BUE and BLE strength normal and equal Skin: warm and dry  Neuro:  CNs 2-12 intact, no focal abnormalities noted Psych:  Normal affect   EKG:  The EKG was personally reviewed and demonstrates: Sinus tachycardia, severe LVH, biatrial enlargement  Relevant CV Studies:  Echo: 11/29/19  IMPRESSIONS    1. Left ventricular ejection fraction, by estimation, is <20%. The left  ventricle has severely decreased function. The left ventricle demonstrates  global hypokinesis. The left ventricular internal cavity size was  moderately dilated. There is severe left  ventricular hypertrophy. Left ventricular diastolic parameters are  consistent with Grade I diastolic dysfunction (impaired relaxation).  2. Right ventricular systolic function is moderately reduced. The right  ventricular size is normal. Tricuspid regurgitation signal is inadequate  for assessing PA pressure.  3. Left atrial size was mildly dilated.  4. The mitral valve is normal in structure. Mild mitral valve  regurgitation.  5. The aortic valve is tricuspid. Aortic valve regurgitation is not  visualized. No aortic stenosis is present.  6. Aortic dilatation noted. There is mild dilatation of the aortic  root,  measuring 41 mm.  7. The inferior vena cava is normal in size with greater than 50%  respiratory variability, suggesting right atrial pressure of 3 mmHg.   Laboratory Data:  High Sensitivity Troponin:   Recent Labs  Lab 11/29/19 0037 11/29/19 0237  TROPONINIHS 675* 505*     Chemistry Recent Labs  Lab 11/29/19 0037 11/29/19 0122  NA 140 137  K 2.6* 2.7*  CL 100  --   CO2 27  --   GLUCOSE 350*  --   BUN 18  --   CREATININE 1.85*  --   CALCIUM 9.0  --   GFRNONAA 42*  --   ANIONGAP 13  --     Recent Labs  Lab 11/29/19 0037  PROT 6.8  ALBUMIN 3.7  AST 48*  ALT 45*  ALKPHOS 94  BILITOT 0.6   Hematology Recent Labs  Lab 11/29/19 0037 11/29/19 0122  WBC 14.3*  --   RBC 5.14  --   HGB 14.6 15.0  HCT 48.4 44.0  MCV 94.2  --   MCH 28.4  --   MCHC 30.2  --   RDW 13.2  --   PLT 151  --    BNP Recent Labs  Lab 11/29/19 0037  BNP 1,278.7*    DDimer No results for input(s): DDIMER in the last 168 hours.   Radiology/Studies:  DG Chest Port 1 View  Result Date: 11/29/2019 CLINICAL DATA:  A initial evaluation for acute shortness of breath. EXAM: PORTABLE CHEST 1 VIEW COMPARISON:  None available. FINDINGS: Advanced cardiomegaly.  Mediastinal silhouette within normal limits. Lungs well inflated. Diffuse pulmonary vascular congestion with bilateral airspace disease, favored to reflect moderate diffuse pulmonary edema. Probable superimposed small left pleural effusion. Multifocal infection difficult to exclude, and could be considered in the correct clinical setting. No pneumothorax. No acute osseous finding.  Thoracolumbar scoliosis noted. IMPRESSION: 1. Cardiomegaly with diffuse vascular congestion and bilateral airspace opacities, favored to reflect moderate diffuse pulmonary edema. Multifocal infection is difficult to exclude, and could be considered in the correct clinical setting.  2. Suspected small left pleural effusion. Electronically Signed   By: Rise Mu M.D.   On: 11/29/2019 01:50     Assessment and Plan:   Kevin [name] is a 54 y.o. male with a hx of HTN, central retinal vein occlusion, DM and tobacco use who is being seen today for the evaluation of elevated troponin in the setting of hypertensive emergency at the request of Dr. Ophelia Charter.  1. Elevated troponin: Peak of 625.  He denies any chest pain prior to admission only shortness of breath.  EKG showed sinus tachycardia with LVH.  Does have risk factors with uncontrolled hypertension, diabetes and tobacco use.  In light of echocardiogram with EF noted less than 20% we will start IV heparin with plans for further ischemic work-up pending renal function. --Continue aspirin, start statin  2.  Acute combined systolic and diastolic heart failure: Echo notes EF less than 20% with grade 1 diastolic dysfunction.  Suspect this could be nonischemic from uncontrolled severe hypertension.  Would benefit from further ischemic work-up with right and left cardiac catheterization pending renal function.  We will recheck renal function in a.m. to determine timing.  BNP 1278, chest x-ray with bilateral pulmonary edema. --Net -1.7 L, will hold IV lasix as he appears comfortable on exam and reassess BMET in the morning --Continue Coreg 3.125 mg twice daily, Norvasc 10 mg daily.  Stop IV nitro, add Imdur 30 mg daily.  May be a good candidate for BiDil, avoid ACE/ARB/Entresto given elevated creatinine  3.  Uncontrolled hypertension/hypertensive emergency: Blood pressures are noted to be greater than 200 systolic, greater than 190 diastolic on admission.  Significant improvement with initiation of IV nitro and p.o. medications.  Stop IV nitro as above --Continue Coreg 3.125 mg twice daily, Norvasc 10 mg daily, Imdur 30 mg daily.  Further titrate meds as tolerated  4.  Diabetes: States was recently started on Metformin as an outpatient. --Continue to hold metformin, sliding scale while  inpatient --Hemoglobin A1c pending --May be a good candidate for SGLT2 pending work-up  5.  Hypokalemia: K+ 2.6 on admission, receiving IV supplement  6.  Tobacco use: Cessation advised   For questions or updates, please contact [facility] Please consult [email or web address] for contact info under    Signed, Laverda Page, NP  11/29/2019 2:54 PM  Patient seen and examined and agree with Laverda Page, NP.  In brief, the patient is a 54 year old male with history of HTN, central retinal vein occlusion, DMII, and prolonged tobacco use who is non-compliant with medications and does not seek regular medical care and presented with severe shortness of breath and hypoxia. Cardiology was consulted for elevated troponin.  On admission here, patient very hypertensive with SBP 244. CXR demonstrated pulmonary edema, likely flash pulmonary edema in the setting of poorly controlled HTN. ECG with NSR, LVH with strain TTE with LVEF <20%, severe LVH.  Unfortunately, the patient has been very non-compliant with anti-hypertensives and diabetic medications. He does not seek regular medical care despite the urgings of his parents. I had a very long discussion today about the severity of his disease and the need to take his medications as well as have regular follow-up. Despite this, he is asking to leave the hospital and have a cigarette as he "feels fine." Due to significant concern that he will not be compliant going forward, the patient is not a cath candidate at this time. Will continue to optimize his medications and treat medically for NSTEMI.  Exam: GEN: No acute distress.   Neck: no JVD Cardiac: RRR, +S3 Respiratory: Clear to auscultation bilaterally. GI: Soft, nontender, non-distended  MS: No edema; No deformity. Neuro:  Nonfocal  Psych: Normal affect   Plan: -Acute heart failure with LVEF <20%--may be related to severe, uncontrolled HTN although does have several risk factors for CAD (HTN,  DMII, tobacco use) -Unfortunately, patient not currently a cath candidate as he is very non-compliant and would be high risk for PCI given need to be compliant with DAPT -Continue medical management of NSTEMI with heparin gtt, ASA, statin, BB -Wean off nitro gtt--start amlodipine, imdur, coreg  -No ACE/ARB due to AKI -Follow-up A1C and adjust medications accordingly -Re-dose lasix tomorrow pending BMET -Tobacco cessation counseling provided at length however patient is not interested at this time -Will need a lot of counseling about medication compliance and the severity of his illness as patient is very high risk for further decompensation and/or death from his poorly controlled risk factors (HTN, DMII) in addition to his newly diagnosed HFrEF. I discussed this with him and his mother at length at the bedside today.  Total time of encounter: 40 minutes total time of encounter, including 30 minutes spent in face-to-face patient care on the date of this encounter. This time includes coordination of care and counseling regarding above mentioned problem list. Remainder of non-face-to-face time involved reviewing chart documents/testing relevant to the patient encounter and documentation in the medical record. I have independently reviewed documentation from referring provider.   Kevin Flatten, MD [place]  [facility]

## 2019-11-29 NOTE — ED Provider Notes (Signed)
[facility] EMERGENCY DEPARTMENT Provider Note   CSN: [id number] Arrival date & time: 11/29/19  0033     History Chief Complaint  Patient presents with  . Shortness of Breath    Kevin [name] is a 54 y.o. male.  Patient brought to the emergency department from home for shortness of breath.  Father called EMS after he heard patient coughing and found him having trouble breathing.  EMS report that the patient was 82% on room air.  They placed him on a nonrebreather with improvement.  Patient has been in distress throughout transport.  He has been markedly hypertensive.  He does have a history of hypertension.  Patient does not have any known history of heart failure or chronic lung disease.  He is not experiencing any chest pain currently.        Past Medical History:  Diagnosis Date  . Diabetes mellitus without complication (HCC)   . Hypertension     There are no problems to display for this patient.   No past surgical history on file.     Family History  Problem Relation Age of Onset  . Diabetes Mother   . Hypertension Mother   . Hypertension Father   . Diabetes Maternal Grandmother   . Hypertension Maternal Grandmother   . Arthritis Maternal Grandmother     Social History   Tobacco Use  . Smoking status: Current Every Day Smoker    Packs/day: 2.00  . Smokeless tobacco: Never Used  Vaping Use  . Vaping Use: Some days  Substance Use Topics  . Alcohol use: Yes  . Drug use: Yes    Types: Marijuana    Home Medications Prior to Admission medications   Medication Sig Start Date End Date Taking? Authorizing Provider  acetaminophen (TYLENOL) 325 MG tablet Take 2 tablets (650 mg total) by mouth every 6 (six) hours as needed (pain). 10/21/16   Caccavale, Sophia, PA-C  amLODipine (NORVASC) 2.5 MG tablet Take 4 tablets (10 mg total) by mouth daily. 04/08/12   Linwood Dibbles, MD  cyclobenzaprine (FLEXERIL) 5 MG tablet Take 1 tablet (5 mg total) by  mouth 2 (two) times daily as needed for muscle spasms. 10/21/16   Caccavale, Sophia, PA-C  HYDROcodone-acetaminophen (NORCO) 5-325 MG per tablet Take 1-2 tablets by mouth every 6 (six) hours as needed for pain. 04/08/12   Linwood Dibbles, MD  lisinopril-hydrochlorothiazide (PRINZIDE,ZESTORETIC) 10-12.5 MG tablet Take 2 tablets by mouth daily.  02/02/17   [provider]  metFORMIN (GLUCOPHAGE) 1000 MG tablet Take 1,000 mg by mouth 2 (two) times daily. 02/02/17   [provider]    Allergies    Patient has no known allergies.  Review of Systems   Review of Systems  Respiratory: Positive for shortness of breath.   All other systems reviewed and are negative.   Physical Exam Updated Vital Signs BP (!) 152/120   Pulse (!) 102   Resp (!) 29   SpO2 99%   Physical Exam Vitals and nursing note reviewed.  Constitutional:      General: He is in acute distress.     Appearance: Normal appearance. He is well-developed. He is diaphoretic.  HENT:     Head: Normocephalic and atraumatic.     Right Ear: Hearing normal.     Left Ear: Hearing normal.     Nose: Nose normal.  Eyes:     Conjunctiva/sclera: Conjunctivae normal.     Pupils: Pupils are equal, round, and reactive to  light.  Cardiovascular:     Rate and Rhythm: Regular rhythm.     Heart sounds: S1 normal and S2 normal. No murmur heard.  No friction rub. No gallop.   Pulmonary:     Effort: Tachypnea, accessory muscle usage and respiratory distress (tripod position) present.     Breath sounds: Rales present.  Chest:     Chest wall: No tenderness.  Abdominal:     General: Bowel sounds are normal.     Palpations: Abdomen is soft.     Tenderness: There is no abdominal tenderness. There is no guarding or rebound. Negative signs include Murphy's sign and McBurney's sign.     Hernia: No hernia is present.  Musculoskeletal:        General: Normal range of motion.     Cervical back: Normal range of motion and neck supple.   Skin:    General: Skin is warm.     Findings: No rash.  Neurological:     Mental Status: He is alert and oriented to person, place, and time.     GCS: GCS eye subscore is 4. GCS verbal subscore is 5. GCS motor subscore is 6.     Cranial Nerves: No cranial nerve deficit.     Sensory: No sensory deficit.     Coordination: Coordination normal.  Psychiatric:        Speech: Speech normal.        Behavior: Behavior normal.        Thought Content: Thought content normal.     ED Results / Procedures / Treatments   Labs (all labs ordered are listed, but only abnormal results are displayed) Labs Reviewed  CBC WITH DIFFERENTIAL/PLATELET - Abnormal; Notable for the following components:      Result Value   WBC 14.3 (*)    Neutro Abs 8.2 (*)    Lymphs Abs 5.1 (*)    Abs Immature Granulocytes 0.08 (*)    All other components within normal limits  COMPREHENSIVE METABOLIC PANEL - Abnormal; Notable for the following components:   Potassium 2.6 (*)    Glucose, Bld 350 (*)    Creatinine, Ser 1.85 (*)    AST 48 (*)    ALT 45 (*)    GFR, Estimated 42 (*)    All other components within normal limits  BRAIN NATRIURETIC PEPTIDE - Abnormal; Notable for the following components:   B Natriuretic Peptide 1,278.7 (*)    All other components within normal limits  I-STAT ARTERIAL BLOOD GAS, ED - Abnormal; Notable for the following components:   pH, Arterial 7.246 (*)    pCO2 arterial 53.4 (*)    pO2, Arterial 63 (*)    Acid-base deficit 5.0 (*)    Potassium 2.7 (*)    All other components within normal limits  TROPONIN I (HIGH SENSITIVITY) - Abnormal; Notable for the following components:   Troponin I (High Sensitivity) 675 (*)    All other components within normal limits  RESPIRATORY PANEL BY RT PCR (FLU A&B, COVID)  TROPONIN I (HIGH SENSITIVITY)    EKG EKG Interpretation  Date/Time:  Thursday November 29 2019 02:41:34 EST Ventricular Rate:  106 PR Interval:    QRS Duration: 114 QT  Interval:  407 QTC Calculation: 541 R Axis:   87 Text Interpretation: Sinus tachycardia Biatrial enlargement LVH with IVCD and secondary repol abnrm Prolonged QT interval No significant change since last tracing Confirmed by Gilda Crease [phone]) on 11/29/2019 2:54:12 AM   Radiology DG  Chest Port 1 View  Result Date: 11/29/2019 CLINICAL DATA:  A initial evaluation for acute shortness of breath. EXAM: PORTABLE CHEST 1 VIEW COMPARISON:  None available. FINDINGS: Advanced cardiomegaly.  Mediastinal silhouette within normal limits. Lungs well inflated. Diffuse pulmonary vascular congestion with bilateral airspace disease, favored to reflect moderate diffuse pulmonary edema. Probable superimposed small left pleural effusion. Multifocal infection difficult to exclude, and could be considered in the correct clinical setting. No pneumothorax. No acute osseous finding.  Thoracolumbar scoliosis noted. IMPRESSION: 1. Cardiomegaly with diffuse vascular congestion and bilateral airspace opacities, favored to reflect moderate diffuse pulmonary edema. Multifocal infection is difficult to exclude, and could be considered in the correct clinical setting. 2. Suspected small left pleural effusion. Electronically Signed   By: Rise Mu M.D.   On: 11/29/2019 01:50    Procedures Procedures (including critical care time)  Medications Ordered in ED Medications  nitroGLYCERIN 50 mg in dextrose 5 % 250 mL (0.2 mg/mL) infusion (70 mcg/min Intravenous Rate/Dose Change 11/29/19 0205)  LORazepam (ATIVAN) injection 1 mg (1 mg Intravenous Given 11/29/19 0043)  furosemide (LASIX) injection 80 mg (80 mg Intravenous Given 11/29/19 0054)    ED Course  I have reviewed the triage vital signs and the nursing notes.  Pertinent labs & imaging results that were available during my care of the patient were reviewed by me and considered in my medical decision making (see chart for details).    MDM  Rules/Calculators/A&P                          Patient arrives in severe respiratory distress.  He had room air oxygen saturations recorded at 82% at home, has improved with supplemental oxygen.  Patient sitting forward in tripod position, tachypneic upon arrival.  He is persistently coughing up pink frothy sputum.  Presentation consistent with hypertensive crisis with pulmonary edema.  Initiated on Lasix, nitroglycerin, BiPAP.  Patient extremely anxious at arrival, administered Ativan to facilitate BiPAP.  Patient's oxygenation has significantly improved on BiPAP.  Blood pressure is much improved with the IV nitroglycerin drip.  Patient now resting comfortably.  Lab work reveals elevated troponin and BNP.  Chest x-ray confirms pulmonary edema.  EKG with LVH and secondary repolarization abnormalities, unchanged from prior.  Elevated troponin likely secondary to leak from hypertensive emergency.  Discussed with cardiology, Dr. Noemi Chapel. Agrees with current treatment plan.  Does not require heparin, elevated troponin secondary to hypertensive emergency.  Admit to medicine.  CRITICAL CARE Performed by: Gilda Crease   Total critical care time: 35 minutes  Critical care time was exclusive of separately billable procedures and treating other patients.  Critical care was necessary to treat or prevent imminent or life-threatening deterioration.  Critical care was time spent personally by me on the following activities: development of treatment plan with patient and/or surrogate as well as nursing, discussions with consultants, evaluation of patient's response to treatment, examination of patient, obtaining history from patient or surrogate, ordering and performing treatments and interventions, ordering and review of laboratory studies, ordering and review of radiographic studies, pulse oximetry and re-evaluation of patient's condition.   Final Clinical Impression(s) / ED Diagnoses Final  diagnoses:  Hypertensive emergency  Acute respiratory failure with hypoxia [facility])    Rx / DC Orders ED Discharge Orders    None       Liley Rake, Canary Brim, MD 11/29/19 [phone]

## 2019-11-29 NOTE — ED Notes (Signed)
Lunch Tray Ordered @ 1000. 

## 2019-11-29 NOTE — Progress Notes (Signed)
ANTICOAGULATION CONSULT NOTE - Follow Up Consult  Pharmacy Consult for Heparin Indication: chest pain/ACS  No Known Allergies  Patient Measurements: Height: 6\' 2"  (188 cm) Weight: 78.6 kg (173 lb 4.5 oz) IBW/kg (Calculated) : 82.2  Vital Signs: Temp: 97.8 F (36.6 C) (11/18 1331) Temp Source: Oral (11/18 1331) BP: 138/100 (11/18 1331) Pulse Rate: 78 (11/18 1331)  Labs: Recent Labs    11/29/19 0037 11/29/19 0122 11/29/19 0237  HGB 14.6 15.0  --   HCT 48.4 44.0  --   PLT 151  --   --   CREATININE 1.85*  --   --   TROPONINIHS 675*  --  505*    Estimated Creatinine Clearance: 50.2 mL/min (A) (by C-G formula based on SCr of 1.85 mg/dL (H)).   Assessment: 54 year old male to begin heparin for elevated troponin / ACS.  On admission coughing up pink frothy sputum with a Scr of 1.85.  Echo with and EF of < 20 %.    Received Lovenox 40 mg at 10 am  Goal of Therapy:  Heparin level 0.3-0.7 units/ml Monitor platelets by anticoagulation protocol: Yes   Plan:  Heparin 3000 units iv bolus x 1  Heparin drip at 1100 units / hr Heparin level in 6 hours Daily heparin level, CBC  Thank you 53, PharmD [phone]  11/29/2019,3:35 PM

## 2019-11-29 NOTE — Progress Notes (Signed)
Pt accompanied to the unit with ED nurse. Assisted the patient to ambulate to the bed. Oriented to room.

## 2019-11-29 NOTE — Progress Notes (Signed)
  Echocardiogram 2D Echocardiogram has been performed.  Burnard Hawthorne 11/29/2019, 10:38 AM

## 2019-11-30 LAB — CBC WITH DIFFERENTIAL/PLATELET
Abs Immature Granulocytes: 0.03 10*3/uL (ref 0.00–0.07)
Basophils Absolute: 0 10*3/uL (ref 0.0–0.1)
Basophils Relative: 0 %
Eosinophils Absolute: 0.1 10*3/uL (ref 0.0–0.5)
Eosinophils Relative: 1 %
HCT: 36.5 % — ABNORMAL LOW (ref 39.0–52.0)
Hemoglobin: 11.5 g/dL — ABNORMAL LOW (ref 13.0–17.0)
Immature Granulocytes: 0 %
Lymphocytes Relative: 29 %
Lymphs Abs: 2.5 10*3/uL (ref 0.7–4.0)
MCH: 28.3 pg (ref 26.0–34.0)
MCHC: 31.5 g/dL (ref 30.0–36.0)
MCV: 89.9 fL (ref 80.0–100.0)
Monocytes Absolute: 0.6 10*3/uL (ref 0.1–1.0)
Monocytes Relative: 7 %
Neutro Abs: 5.3 10*3/uL (ref 1.7–7.7)
Neutrophils Relative %: 63 %
Platelets: 93 10*3/uL — ABNORMAL LOW (ref 150–400)
RBC: 4.06 MIL/uL — ABNORMAL LOW (ref 4.22–5.81)
RDW: 13.1 % (ref 11.5–15.5)
Smear Review: DECREASED
WBC: 8.5 10*3/uL (ref 4.0–10.5)
nRBC: 0 % (ref 0.0–0.2)

## 2019-11-30 LAB — BASIC METABOLIC PANEL
Anion gap: 7 (ref 5–15)
BUN: 21 mg/dL — ABNORMAL HIGH (ref 6–20)
CO2: 28 mmol/L (ref 22–32)
Calcium: 8.5 mg/dL — ABNORMAL LOW (ref 8.9–10.3)
Chloride: 104 mmol/L (ref 98–111)
Creatinine, Ser: 1.6 mg/dL — ABNORMAL HIGH (ref 0.61–1.24)
GFR, Estimated: 51 mL/min — ABNORMAL LOW (ref >60)
Glucose, Bld: 207 mg/dL — ABNORMAL HIGH (ref 70–99)
Potassium: 3.6 mmol/L (ref 3.5–5.1)
Sodium: 139 mmol/L (ref 135–145)

## 2019-11-30 LAB — HEPATIC FUNCTION PANEL
ALT: 31 U/L (ref 0–44)
AST: 19 U/L (ref 15–41)
Albumin: 3.5 g/dL (ref 3.5–5.0)
Alkaline Phosphatase: 77 U/L (ref 38–126)
Bilirubin, Direct: 0.2 mg/dL (ref 0.0–0.2)
Indirect Bilirubin: 1.4 mg/dL — ABNORMAL HIGH (ref 0.3–0.9)
Total Bilirubin: 1.6 mg/dL — ABNORMAL HIGH (ref 0.3–1.2)
Total Protein: 6.7 g/dL (ref 6.5–8.1)

## 2019-11-30 LAB — GLUCOSE, CAPILLARY
Glucose-Capillary: 154 mg/dL — ABNORMAL HIGH (ref 70–99)
Glucose-Capillary: 183 mg/dL — ABNORMAL HIGH (ref 70–99)
Glucose-Capillary: 169 mg/dL — ABNORMAL HIGH (ref 70–99)

## 2019-11-30 LAB — T4, FREE: Free T4: 1.1 ng/dL (ref 0.61–1.12)

## 2019-11-30 LAB — TSH: TSH: 0.447 u[IU]/mL (ref 0.350–4.500)

## 2019-11-30 LAB — HEPARIN LEVEL (UNFRACTIONATED)
Heparin Unfractionated: 0.54 IU/mL (ref 0.30–0.70)
Heparin Unfractionated: 0.5 IU/mL (ref 0.30–0.70)

## 2019-11-30 MED ORDER — HYDRALAZINE HCL 50 MG PO TABS
50.0000 mg | ORAL_TABLET | Freq: Three times a day (TID) | ORAL | Status: DC
  Administered 2019-11-30 – 2019-12-01 (×3): 50 mg via ORAL
  Filled 2019-11-30 (×3): qty 1

## 2019-11-30 MED ORDER — LABETALOL HCL 5 MG/ML IV SOLN
10.0000 mg | INTRAVENOUS | Status: DC | PRN
  Administered 2019-11-30 – 2019-12-01 (×3): 10 mg via INTRAVENOUS
  Filled 2019-11-30 (×3): qty 4

## 2019-11-30 MED ORDER — ISOSORBIDE MONONITRATE ER 60 MG PO TB24
60.0000 mg | ORAL_TABLET | Freq: Every day | ORAL | Status: DC
  Administered 2019-12-01: 60 mg via ORAL
  Filled 2019-11-30: qty 1

## 2019-11-30 MED ORDER — ISOSORBIDE MONONITRATE ER 30 MG PO TB24
30.0000 mg | ORAL_TABLET | Freq: Once | ORAL | Status: AC
  Administered 2019-11-30: 30 mg via ORAL
  Filled 2019-11-30: qty 1

## 2019-11-30 MED ORDER — ENSURE ENLIVE PO LIQD: 237.0000 mL | Freq: Two times a day (BID) | ORAL | Status: DC

## 2019-11-30 MED ORDER — CARVEDILOL 6.25 MG PO TABS
6.2500 mg | ORAL_TABLET | Freq: Two times a day (BID) | ORAL | Status: DC
  Administered 2019-11-30 – 2019-12-01 (×2): 6.25 mg via ORAL
  Filled 2019-11-30 (×2): qty 1

## 2019-11-30 MED ORDER — LIVING BETTER WITH HEART FAILURE BOOK: Freq: Once | Status: AC

## 2019-11-30 MED ORDER — HYDRALAZINE HCL 25 MG PO TABS: 25.0000 mg | ORAL_TABLET | Freq: Three times a day (TID) | ORAL | Status: DC

## 2019-11-30 NOTE — TOC Initial Note (Signed)
Transition of Care [facility]) - Initial/Assessment Note    Patient Details  Name: Kevin [name] MRN: [id number] Date of Birth: [birth year]  [facility]) CM/SW Contact:    Pollie Friar, RN Phone Number: 11/30/2019, 12:58 PM  Clinical Narrative:                 CM met with the patient at the bedside. Pt is without insurance but has been going to [facility] for PCP. He says they got him set up with an [facility] card. Pt is unsure if the card is expired. CM was able to get him an initial Telehealth visit with information on the AVS. Pt will have to follow up with the clinic to get [facility] card reactivated if expired.  Pt denies DME or transportation issues. CM inquired about a [place] RN at d/c and pt refused.  TOC following. Pt may need medication assist at d/c.   Expected Discharge Plan: Home/Self Care Barriers to Discharge: Inadequate or no insurance, Continued Medical Work up   Patient Goals and CMS Choice     Choice offered to / list presented to : Patient  Expected Discharge Plan and Services Expected Discharge Plan: Home/Self Care   Discharge Planning Services: CM Consult   Living arrangements for the past 2 months: Single Family Home                                      Prior Living Arrangements/Services Living arrangements for the past 2 months: Single Family Home Lives with:: Parents (Dad) Patient language and need for interpreter reviewed:: Yes Do you feel safe going back to the place where you live?: Yes            Criminal Activity/Legal Involvement Pertinent to Current Situation/Hospitalization: No - Comment as needed  Activities of Daily Living      Permission Sought/Granted                  Emotional Assessment Appearance:: Appears stated age Attitude/Demeanor/Rapport: Engaged Affect (typically observed): Accepting Orientation: : Oriented to Self, Oriented to Place, Oriented to  Time, Oriented to Situation   Psych  Involvement: No (comment)  Admission diagnosis:  Acute respiratory failure with hypoxia ([place]) [J96.01] Hypertensive emergency [I16.1] Patient Active Problem List   Diagnosis Date Noted  . Hypertensive emergency 11/29/2019  . Tobacco dependence 11/29/2019  . Marijuana abuse 11/29/2019  . Renal dysfunction 11/29/2019  . Hypokalemia 11/29/2019  . Diabetes mellitus without complication [facility])    PCP:  Medicine, [facility]:   [place] ([place] [place] [address] [place] [address] Phone: [phone] Fax: [phone]     Social Determinants of Health (SDOH) Interventions    Readmission Risk Interventions No flowsheet data found.

## 2019-11-30 NOTE — Progress Notes (Addendum)
Progress Note  Patient Name: Kevin [name] Date of Encounter: 11/30/2019  Primary Cardiologist: Meriam Sprague, MD  Subjective   Discussed diagnoses at length with patient. His major concern at this time is that he does not like blood draws. Breathing feels better. Mild headache which is unusual for him. Denies chest pain. Has lost about 50lb since 2015-2016 unintentionally.  Inpatient Medications    Scheduled Meds: . amLODipine  10 mg Oral Daily  . aspirin EC  81 mg Oral Daily  . atorvastatin  40 mg Oral Daily  . carvedilol  3.125 mg Oral BID WC  . insulin aspart  0-15 Units Subcutaneous TID WC  . insulin aspart  0-5 Units Subcutaneous QHS  . isosorbide mononitrate  30 mg Oral Daily  . nicotine  14 mg Transdermal Daily  . sodium chloride flush  3 mL Intravenous Q12H   Continuous Infusions: . sodium chloride    . heparin 1,100 Units/hr (11/29/19 1651)   PRN Meds: sodium chloride, acetaminophen, hydrALAZINE, labetalol, ondansetron (ZOFRAN) IV, sodium chloride flush   Vital Signs    Vitals:   11/30/19 0645 11/30/19 0659 11/30/19 0703 11/30/19 0755  BP: (!) 214/137 (!) 206/136 (!) 180/128 (!) 171/127  Pulse: (!) 102 (!) 107 91 97  Resp:    18  Temp:    98.9 F (37.2 C)  TempSrc:    Oral  SpO2:    100%  Weight:      Height:        Intake/Output Summary (Last 24 hours) at 11/30/2019 1059 Last data filed at 11/30/2019 1005 Gross per 24 hour  Intake 1209.09 ml  Output 3300 ml  Net -2090.91 ml   Last 3 Weights 11/30/2019 11/29/2019 11/29/2019  Weight (lbs) 173 lb 4.5 oz 173 lb 4.5 oz 195 lb  Weight (kg) 78.6 kg 78.6 kg 88.451 kg     Telemetry    NSR - Personally Reviewed  Physical Exam   GEN: No acute distress.  HEENT: Normocephalic, atraumatic, sclera mildly icteric, exopthalmos noted Neck: No JVD or bruits. Cardiac: RRR no murmurs, rubs, or gallops.  Radials/DP/PT 1+ and equal bilaterally.  Respiratory: Clear to auscultation bilaterally.  Breathing is unlabored. GI: Soft, nontender, non-distended, BS +x 4. MS: no deformity. Extremities: No clubbing or cyanosis. No edema. Distal pedal pulses are 2+ and equal bilaterally. Neuro:  AAOx3. Follows commands. Psych:  Responds to questions appropriately with a normal affect.  Labs    High Sensitivity Troponin:   Recent Labs  Lab 11/29/19 0037 11/29/19 0237  TROPONINIHS 675* 505*      Cardiac EnzymesNo results for input(s): TROPONINI in the last 168 hours. No results for input(s): TROPIPOC in the last 168 hours.   Chemistry Recent Labs  Lab 11/29/19 0037 11/29/19 0122 11/30/19 0242  NA 140 137 139  K 2.6* 2.7* 3.6  CL 100  --  104  CO2 27  --  28  GLUCOSE 350*  --  207*  BUN 18  --  21*  CREATININE 1.85*  --  1.60*  CALCIUM 9.0  --  8.5*  PROT 6.8  --   --   ALBUMIN 3.7  --   --   AST 48*  --   --   ALT 45*  --   --   ALKPHOS 94  --   --   BILITOT 0.6  --   --   GFRNONAA 42*  --  51*  ANIONGAP 13  --  7  Hematology Recent Labs  Lab 11/29/19 0037 11/29/19 0122 11/30/19 0242  WBC 14.3*  --  8.5  RBC 5.14  --  4.06*  HGB 14.6 15.0 11.5*  HCT 48.4 44.0 36.5*  MCV 94.2  --  89.9  MCH 28.4  --  28.3  MCHC 30.2  --  31.5  RDW 13.2  --  13.1  PLT 151  --  93*    BNP Recent Labs  Lab 11/29/19 0037  BNP 1,278.7*     DDimer No results for input(s): DDIMER in the last 168 hours.   Radiology    DG Chest Port 1 View  Result Date: 11/29/2019 CLINICAL DATA:  A initial evaluation for acute shortness of breath. EXAM: PORTABLE CHEST 1 VIEW COMPARISON:  None available. FINDINGS: Advanced cardiomegaly.  Mediastinal silhouette within normal limits. Lungs well inflated. Diffuse pulmonary vascular congestion with bilateral airspace disease, favored to reflect moderate diffuse pulmonary edema. Probable superimposed small left pleural effusion. Multifocal infection difficult to exclude, and could be considered in the correct clinical setting. No pneumothorax.  No acute osseous finding.  Thoracolumbar scoliosis noted. IMPRESSION: 1. Cardiomegaly with diffuse vascular congestion and bilateral airspace opacities, favored to reflect moderate diffuse pulmonary edema. Multifocal infection is difficult to exclude, and could be considered in the correct clinical setting. 2. Suspected small left pleural effusion. Electronically Signed   By: Rise Mu M.D.   On: 11/29/2019 01:50   ECHOCARDIOGRAM COMPLETE  Result Date: 11/29/2019    ECHOCARDIOGRAM REPORT   Patient Name:   MARKEESE BOYAJIAN Date of Exam: 11/29/2019 Medical Rec #:  [id number]        Height: Accession #:    [id number]       Weight: Date of Birth:  10-Feb-1965        BSA: Patient Age:    54 years         BP:           129/101 mmHg Patient Gender: M                HR:           79 bpm. Exam Location:  Inpatient Procedure: 2D Echo, Cardiac Doppler and Color Doppler Indications:    CHF-Acute Systolic 428.21 / I50.21  History:        Patient has no prior history of Echocardiogram examinations.                 Risk Factors:Hypertension, Diabetes and Current Smoker.  Sonographer:    Renella Cunas RDCS Referring Phys: [id number] JENNIFER YATES IMPRESSIONS  1. Left ventricular ejection fraction, by estimation, is <20%. The left ventricle has severely decreased function. The left ventricle demonstrates global hypokinesis. The left ventricular internal cavity size was moderately dilated. There is severe left  ventricular hypertrophy. Left ventricular diastolic parameters are consistent with Grade I diastolic dysfunction (impaired relaxation).  2. Right ventricular systolic function is moderately reduced. The right ventricular size is normal. Tricuspid regurgitation signal is inadequate for assessing PA pressure.  3. Left atrial size was mildly dilated.  4. The mitral valve is normal in structure. Mild mitral valve regurgitation.  5. The aortic valve is tricuspid. Aortic valve regurgitation is not visualized. No aortic  stenosis is present.  6. Aortic dilatation noted. There is mild dilatation of the aortic root, measuring 41 mm.  7. The inferior vena cava is normal in size with greater than 50% respiratory variability, suggesting right atrial pressure of 3 mmHg. FINDINGS  Left  Ventricle: Left ventricular ejection fraction, by estimation, is <20%. The left ventricle has severely decreased function. The left ventricle demonstrates global hypokinesis. The left ventricular internal cavity size was moderately dilated. There is severe left ventricular hypertrophy. Left ventricular diastolic parameters are consistent with Grade I diastolic dysfunction (impaired relaxation). Right Ventricle: The right ventricular size is normal. Right vetricular wall thickness was not assessed. Right ventricular systolic function is moderately reduced. Tricuspid regurgitation signal is inadequate for assessing PA pressure. Left Atrium: Left atrial size was mildly dilated. Right Atrium: Right atrial size was normal in size. Pericardium: There is no evidence of pericardial effusion. Mitral Valve: The mitral valve is normal in structure. Mild mitral valve regurgitation. Tricuspid Valve: The tricuspid valve is normal in structure. Tricuspid valve regurgitation is trivial. Aortic Valve: The aortic valve is tricuspid. Aortic valve regurgitation is not visualized. No aortic stenosis is present. Pulmonic Valve: The pulmonic valve was normal in structure. Pulmonic valve regurgitation is trivial. Aorta: Aortic dilatation noted. There is mild dilatation of the aortic root, measuring 41 mm. Venous: The inferior vena cava is normal in size with greater than 50% respiratory variability, suggesting right atrial pressure of 3 mmHg. IAS/Shunts: The interatrial septum was not well visualized.  LEFT VENTRICLE PLAX 2D LVIDd:         6.80 cm      Diastology LVIDs:         6.30 cm      LV e' medial:    3.02 cm/s LV PW:         1.30 cm      LV E/e' medial:  11.4 LV IVS:         1.30 cm      LV e' lateral:   3.09 cm/s LVOT diam:     2.20 cm      LV E/e' lateral: 11.1 LV SV:         41 LVOT Area:     3.80 cm  LV Volumes (MOD) LV vol d, MOD A2C: 294.0 ml LV vol d, MOD A4C: 266.0 ml LV vol s, MOD A2C: 237.0 ml LV vol s, MOD A4C: 198.0 ml LV SV MOD A2C:     57.0 ml LV SV MOD A4C:     266.0 ml LV SV MOD BP:      54.4 ml RIGHT VENTRICLE RV S prime:     6.32 cm/s TAPSE (M-mode): 1.3 cm LEFT ATRIUM           RIGHT ATRIUM LA diam:      4.70 cm RA Area:     11.80 cm LA Vol (A2C): 61.9 ml RA Volume:   24.60 ml LA Vol (A4C): 66.2 ml  AORTIC VALVE LVOT Vmax:   71.80 cm/s LVOT Vmean:  46.900 cm/s LVOT VTI:    0.108 m  AORTA Ao Root diam: 4.10 cm Ao Asc diam:  3.50 cm MITRAL VALVE MV Area (PHT): 3.42 cm    SHUNTS MV Decel Time: 222 msec    Systemic VTI:  0.11 m MV E velocity: 34.30 cm/s  Systemic Diam: 2.20 cm MV A velocity: 62.40 cm/s MV E/A ratio:  0.55 Epifanio Lesches MD Electronically signed by Epifanio Lesches MD Signature Date/Time: 11/29/2019/3:22:14 PM    Final     Cardiac Studies   2D echo 11/29/19 1. Left ventricular ejection fraction, by estimation, is <20%. The left  ventricle has severely decreased function. The left ventricle demonstrates  global hypokinesis. The left ventricular internal cavity size was  moderately dilated. There is severe left  ventricular hypertrophy. Left ventricular diastolic parameters are  consistent with Grade I diastolic dysfunction (impaired relaxation).  2. Right ventricular systolic function is moderately reduced. The right  ventricular size is normal. Tricuspid regurgitation signal is inadequate  for assessing PA pressure.  3. Left atrial size was mildly dilated.  4. The mitral valve is normal in structure. Mild mitral valve  regurgitation.  5. The aortic valve is tricuspid. Aortic valve regurgitation is not  visualized. No aortic stenosis is present.  6. Aortic dilatation noted. There is mild dilatation of the aortic root,   measuring 41 mm.  7. The inferior vena cava is normal in size with greater than 50%  respiratory variability, suggesting right atrial pressure of 3 mmHg.  Patient Profile     54 y.o. male HTN, central retinal vein occlusion, DM, tobacco use, prior lapses in medical care (running out of medicines) who presented to the hospital with worsening SOB for several days. EMS called out. EMS/ED eval notable for acute hypoxic respiratory failure (82% RA), pink frothy sputum, accelerated HTN, CXR showing vascular congestion and pulmonary edema, and elevated troponin. 2D echo showed EF < 20% with global hypokinesis, grade 1 diastolic dysfunction, severe LVH and mildly dilated left atrium.   Assessment & Plan    1. 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 Accelerated HTN - managed in context above - recheck BP this AM was 179/103 by PT - see below re: thyroid  - check UA to assess for proteinuria - cannot obtain renin/aldo level due to diuretics as it would not be accurate - hold off on pheo and RAS workup until thyroid studies come back  3. Suppressed TSH - ? Hyperthyroid - repeat TSH with free T4, total T3 - he is reluctant for more blood draws but willing - has exopthalmos, weight loss  4.  AKI vs CKD stage III, also with hypokalemia - unclear baseline, 1.85 -> 1.60 - manage in context above - primary team repleting potassium - further imaging per IM - ?baseline renal US  perhaps  5. Elevated troponin - unclear if related to demand ischemia or underlying CAD - per Dr. Devin Going consultation he is not felt to be a cath candidate at this time due to noncompliance. Thus this should be considered in the outpatient setting if he complies with necessary follow-up - continue ASA, BB, nitrate - started on statin on admission - abnormal LFTs noted so recommend f/u labs - remains on heparin, anticipate 48hr of rx but await MD input  6. Tobacco abuse - cessation advised - UDS +THC o/w negative  For questions or updates, please contact [facility] Please consult [email or web address] for contact info under Cardiology/STEMI.  Signed, Laurann Montana, PA-C 11/30/2019, 10:59 AM    Patient seen and examined and agree with Ronie Spies, PA-C as detailed above.  Patient is feeling better. Shortness of breath improved. Blood pressure very elevated. Net negative 330. Wt stable. Cr improved to 1.6.  Exam: GEN: No acute distress.   Neck: No  JVD Cardiac: RRR, no murmurs, rubs, or gallops.  Respiratory: Clear to auscultation bilaterally. GI: Soft, nontender, non-distended  MS: No edema; No deformity. Neuro:  Nonfocal  Psych: Normal affect    Plan: -Up-titrate coreg to 6.25mg  BID -Increase imdur to 60mg  daily -Start hydralazine 50mg  TID and up-titrate as needed -Pending renal function, ultimate goal would be entresto/spironolactone however compliance is a major concern. May need to do imdur/hydralazine in this patient -Heparin gtt for 48h for NSTEMI--stop Saturday afternoon -Given significant compliance issues, patient is not a candidate for cath at this time. Will need close cardiology follow-up and can reassess on out-patient basis. -Significant time was spent with the patient and his family discussing the severity of his illness and his need to comply with medications and follow-up appointments. Unfortunately, he seems to not be concerned as he overall feels well. Will  need continued counseling as out-patient.  Laurance FlattenHeather Armoni Kludt, MD

## 2019-11-30 NOTE — Progress Notes (Signed)
ANTICOAGULATION CONSULT NOTE  Pharmacy Consult for Heparin Indication: chest pain/ACS  No Known Allergies  Patient Measurements: Height: 6\' 2"  (188 cm) Weight: 78.6 kg (173 lb 4.5 oz) IBW/kg (Calculated) : 82.2  Vital Signs: Temp: 98.2 F (36.8 C) (11/19 0346) Temp Source: Oral (11/19 0346) BP: 172/118 (11/19 0346) Pulse Rate: 96 (11/19 0346)  Labs: Recent Labs    11/29/19 0037 11/29/19 0037 11/29/19 0122 11/29/19 0237 11/30/19 0242  HGB 14.6   < > 15.0  --  11.5*  HCT 48.4  --  44.0  --  36.5*  PLT 151  --   --   --  93*  HEPARINUNFRC  --   --   --   --  0.54  CREATININE 1.85*  --   --   --  1.60*  TROPONINIHS 675*  --   --  505*  --    < > = values in this interval not displayed.    Estimated Creatinine Clearance: 58 mL/min (A) (by C-G formula based on SCr of 1.6 mg/dL (H)).   Assessment: 54 y.o. male with elevated troponin/ACS for heparin  Goal of Therapy:  Heparin level 0.3-0.7 units/ml Monitor platelets by anticoagulation protocol: Yes   Plan:  Continue Heparin at current rate   53, PharmD, BCPS  11/30/2019,4:22 AM

## 2019-11-30 NOTE — Evaluation (Signed)
Physical Therapy Evaluation Patient Details Name: Kevin [name] MRN: [id number] DOB: [birth year] Today's Date: 11/30/2019   History of Present Illness  Pt is a 54 year old male who presented with coughing with pink forthy sputum and shortness of breath with his room air oxygen saturations at 82%. He was also hypertensive. Lab work revealed elevated troponin, thought to be from hypertensive emergency. Chest x-ray revealed pulmonary edema. He was also found to have acute heart failure with LVEF < 20%, thought to be from uncontrolled HTN. The pt has a history of noncompliance with medications. PMH of HTN, DM, and blindness in L eye.  Clinical Impression  Returned in afternoon to attempt eval again, with MD clearing pt to work with PT with high blood pressure as long as symptoms were monitored. Pt denied any signs/symptoms throughout session and BP was 174/116 at start of session and 172/113 at end of session. Pt initially was receiving supplemental O2 via [place] at 3L/min and SpO2 was 98-99%, thus weaned pt completely off O2 with SpO2 remaining 98-99% throughout the session. Nurse notified that pt was weaned and remained off supplemental O2 at end of session. Pt is A&Ox4, but demonstrates possible short-term memory and safety recognition deficits. While his bilat LE strength, sensation, and coordination are WNL he demonstrates impaired balance with mobility. When coming to stand and when ambulating without an AD he demonstrated a trunk sway and intermittent bouts of LOB laterally, thereby requiring min guard assist for safety. The pt lives with his father and he works several jobs. Due to his impaired balance and safety recognition HH PT is recommended for follow-up upon d/c to decrease his risk for falls and injury as he is independent at baseline. Will continue to follow acutely.     Follow Up Recommendations Home health PT;Supervision for mobility/OOB (pt may decline)    Equipment Recommendations  Other  (comment) (TBD)    Recommendations for Other Services       Precautions / Restrictions Precautions Precautions: Fall Precaution Comments: Monitor symptoms of HTN Restrictions Weight Bearing Restrictions: No      Mobility  Bed Mobility Overal bed mobility: Modified Independent             General bed mobility comments: Pt able to come supine to sit without use of bed rails but HOB slightly elevated safely.    Transfers Overall transfer level: Needs assistance Equipment used: None Transfers: Sit to/from Stand Sit to Stand: Min guard         General transfer comment: Pt comes to stand quickly but displays trunk sway, min guard assist for safety.  Ambulation/Gait Ambulation/Gait assistance: Min guard Gait Distance (Feet): 150 Feet Assistive device: None Gait Pattern/deviations: Step-through pattern;Staggering left;Narrow base of support Gait velocity: WNL Gait velocity interpretation: >4.37 ft/sec, indicative of normal walking speed General Gait Details: Intermittent bouts of LOB laterally with extra steps taken reactively to regain balance, min guard assist for safety.  Stairs            Wheelchair Mobility    Modified Rankin (Stroke Patients Only)       Balance Overall balance assessment: Mild deficits observed, not formally tested Sitting-balance support: No upper extremity supported;Feet supported Sitting balance-Leahy Scale: Good Sitting balance - Comments: No LOB static sitting EOB, supervision for safety.   Standing balance support: No upper extremity supported;During functional activity Standing balance-Leahy Scale: Fair Standing balance comment: Intermittent lateral LOB and trunk sway noted, min guard assist for safety.  Pertinent Vitals/Pain Pain Assessment: Faces Faces Pain Scale: No hurt Pain Intervention(s): Limited activity within patient's tolerance;Monitored during session;Repositioned     Home Living Family/patient expects to be discharged to:: Private residence Living Arrangements: Parent Available Help at Discharge: Family;Friend(s);Available 24 hours/day Type of Home: House Home Access: Level entry     Home Layout: One level Home Equipment: None Additional Comments: Pt reports his dad is getting old and cannot do heavy tasks. Pt stated "I am supposed to help him".    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 Pt was not driving, he would get rides from dad or others.     Hand Dominance        Extremity/Trunk Assessment   Upper Extremity Assessment Upper Extremity Assessment: Defer to OT evaluation    Lower Extremity Assessment Lower Extremity Assessment: Overall WFL for tasks assessed (Coordination & sensation intact, MMT of 4 to 5 grossly bilat)    Cervical / Trunk Assessment Cervical / Trunk Assessment: Normal  Communication   Communication: No difficulties  Cognition Arousal/Alertness: Awake/alert Behavior During Therapy: WFL for tasks assessed/performed Overall Cognitive Status: No family/caregiver present to determine baseline cognitive functioning                                 General Comments: A&Ox4. Possible short-term memory deficits as he would repeatedly call PT a nurse or state "do not stick me", despite repeatedly correcting him and ensuring that is not within our scope of practice.      General Comments General comments (skin integrity, edema, etc.): BP 174/116 supine start of session, BP 172/113 sitting in chair end of session; No symptoms of HTN reported by pt or noted; SpO2 98-99% throughout while weaned pt from 3L/min via [place] to no supplemental O2    Exercises     Assessment/Plan    PT Assessment Patient needs continued PT services  PT Problem List Decreased balance;Decreased mobility;Decreased safety  awareness;Cardiopulmonary status limiting activity       PT Treatment Interventions DME instruction;Gait training;Stair training;Functional mobility training;Therapeutic activities;Therapeutic exercise;Balance training;Neuromuscular re-education;Cognitive remediation;Patient/family education    PT Goals (Current goals can be found in the Care Plan section)  Acute Rehab PT Goals Patient Stated Goal: to go home PT Goal Formulation: With patient Time For Goal Achievement: 12/14/19 Potential to Achieve Goals: Good    Frequency Min 3X/week   Barriers to discharge        Co-evaluation               AM-PAC PT "6 Clicks" Mobility  Outcome Measure Help needed turning from your back to your side while in a flat bed without using bedrails?: None Help needed moving from lying on your back to sitting on the side of a flat bed without using bedrails?: None Help needed moving to and from a bed to a chair (including a wheelchair)?: A Little Help needed standing up from a chair using your arms (e.g., wheelchair or bedside chair)?: A Little Help needed to walk in hospital room?: A Little Help needed climbing 3-5 steps with a railing? : A Little 6 Click Score: 20    End of Session Equipment Utilized During Treatment: Gait belt Activity Tolerance: Patient tolerated treatment well Patient left: in chair;with call bell/phone within reach;with chair alarm set Nurse Communication: Mobility status;Precautions;Other (comment) (BP,  bleeding at IV insertion, weaned from O2) PT Visit Diagnosis: Unsteadiness on feet (R26.81);Other abnormalities of gait and mobility (R26.89)    Time: 1443-1540 PT Time Calculation (min) (ACUTE ONLY): 30 min   Charges:   PT Evaluation $PT Eval Low Complexity: 1 Low PT Treatments $Gait Training: 8-22 mins        Raymond Gurney, PT, DPT Acute Rehabilitation Services  Pager: [phone] Office: [phone]   Jewel Baize 11/30/2019, 3:29 PM

## 2019-11-30 NOTE — Progress Notes (Signed)
PROGRESS NOTE  Kevin [name] [place]  DOB: February 21, 1965  PCP: Medicine, [facility] ZRA:[id number]  DOA: 11/29/2019  LOS: 1 day   Chief Complaint  Patient presents with  . Shortness of Breath   Brief narrative: Kevin [name] is a 54 y.o. 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 On 11/29/2019, patient was brought to the ED by EMS from home with progressively worsening intermittent shortness of breath and cough for a week culminating into severe symptoms and hypoxia on the night of presentation.  EMS noted oxygen saturation low at 82%, brought to ED on nonrebreather mask. Patient was previously on medication for diabetes and hypertension but he ran out 6 months prior and apparently restarted lisinopril and Metformin a week ago.  In the ED, patient was tachypneic, in tripod position, coughing up pink frothy sputum. Blood pressure was significantly elevated with systolic up to 244. Labs potassium 2.6, creatinine 1.85, BNP 1278, high-sensitivity troponin elevated to 675 >>505, WC count elevated to 14.3 Chest x-ray noted cardiomegaly and diffuse vascular congestion with bilateral pulmonary edema Respiration BiPAP with significant provement respiratory status. EKG showed sinus tachycardia with LVH, biatrial enlargement. He was started on IV nitroglycerin drip. Admitted to hospitalist service Echocardiogram done subsequently showed an EF of less than 20% with global hypokinesis, grade 1 diastolic dysfunction, severe LVH. Cardiology consultation was called.  Subjective: Patient was seen and examined this morning. Middle-aged African-American male.  Lying on bed.  Not in distress.  He seems to be upset because of frequent blood works.  Says he wants to go home. We had a long conversation about his poor cardiac status and need further work-up in the hospital.  He understands and wants to proceed. Chart reviewed. No  fever, heart rate in 90s and low 100s. Blood pressure overnight elevated up to 200s Creatinine slightly improved 1.6  Assessment/Plan: Acute combined systolic and diastolic CHF -Presented with progressively worsening shortness of breath for a week, uncontrolled blood pressure, pulmonary when chest x-ray -echocardiogram with EF 20% and grade 1 diastolic dysfunction -Cardiology following. -Does not seem volume overloaded.  Not on Lasix. -May need cardiac catheterization. -Heart failure regimen per cardiology  Hypertensive emergency Uncontrolled hypertension -Presented with systolic blood pressure more than 240 -Currently on Coreg, Norvasc and Imdur. -IV hydralazine as needed. -Urinalysis for proteinuria.  Elevated troponin -Not complaining of chest pain -May need ischemic work-up -Currently on heparin drip.  To continue to Saturday afternoon per cardiology. -Continue aspirin and statin. Recent Labs    11/29/19 0037 11/29/19 0237  TROPONINIHS 675* 505*   Type 2 diabetes mellitus -A1c 6.7 on 11/18 -Metformin on hold. -Currently on sliding scale insulin with Accu-Cheks.   Recent Labs  Lab 11/29/19 1208 11/29/19 1636 11/29/19 2106 11/30/19 0558 11/30/19 1213  GLUCAP 167* 138* 171* 154* 183*   Hypokalemia -Potassium was low at 2.6 on admission.  Improving with replacement.  Continue to monitor Recent Labs  Lab 11/29/19 0037 11/29/19 0122 11/30/19 0242  K 2.6* 2.7* 3.6   Tobacco/marijuana use -Counseled to quit.  Mobility: Encourage ambulation Code Status:   Code Status: Full Code  Nutritional status: Body mass index is 22.25 kg/m.     Diet Order            Diet heart healthy/carb modified Room service appropriate? Yes; Fluid consistency: Thin; Fluid restriction: 1500 mL Fluid  Diet effective ____  DVT prophylaxis:  Heparin drip    Antimicrobials:  None  Fluid: None Consultants: Cardiology Family Communication:  None at  bedside  Status is: Inpatient  Remains inpatient appropriate because -further cardiac work-ups  Dispo: The patient is from: Home              Anticipated d/c is to: Home              Anticipated d/c date is: 2 to 3 days              Patient currently is not medically stable to d/c.       Infusions:  . sodium chloride    . heparin 1,100 Units/hr (11/29/19 1651)    Scheduled Meds: . amLODipine  10 mg Oral Daily  . aspirin EC  81 mg Oral Daily  . atorvastatin  40 mg Oral Daily  . carvedilol  3.125 mg Oral BID WC  . insulin aspart  0-15 Units Subcutaneous TID WC  . insulin aspart  0-5 Units Subcutaneous QHS  . isosorbide mononitrate  30 mg Oral Daily  . nicotine  14 mg Transdermal Daily  . sodium chloride flush  3 mL Intravenous Q12H    Antimicrobials: Anti-infectives (From admission, onward)   None      PRN meds: sodium chloride, acetaminophen, hydrALAZINE, labetalol, ondansetron (ZOFRAN) IV, sodium chloride flush   Objective: Vitals:   11/30/19 0703 11/30/19 0755  BP: (!) 180/128 (!) 171/127  Pulse: 91 97  Resp:  18  Temp:  98.9 F (37.2 C)  SpO2:  100%    Intake/Output Summary (Last 24 hours) at 11/30/2019 0829 Last data filed at 11/30/2019 0756 Gross per 24 hour  Intake 969.09 ml  Output 2300 ml  Net -1330.91 ml   Filed Weights   11/29/19 1147 11/29/19 1400 11/30/19 0548  Weight: 88.5 kg 78.6 kg 78.6 kg   Weight change:  Body mass index is 22.25 kg/m.   Physical Exam: General exam: Not in physical distress Skin: No rashes, lesions or ulcers. HEENT: Atraumatic, normocephalic, no obvious bleeding Lungs: Clear to auscultation bilaterally CVS: Regular rate and rhythm, no murmur GI/Abd soft, nontender, nondistended, bowel sound present CNS: Alert, awake, oriented x3 Psychiatry: Mood appropriate Extremities: No pedal edema, no calf tenderness  Data Review: I have personally reviewed the laboratory data and studies available.  Recent Labs   Lab 11/29/19 0037 11/29/19 0122 11/30/19 0242  WBC 14.3*  --  8.5  NEUTROABS 8.2*  --  5.3  HGB 14.6 15.0 11.5*  HCT 48.4 44.0 36.5*  MCV 94.2  --  89.9  PLT 151  --  93*   Recent Labs  Lab 11/29/19 0037 11/29/19 0122 11/30/19 0242  NA 140 137 139  K 2.6* 2.7* 3.6  CL 100  --  104  CO2 27  --  28  GLUCOSE 350*  --  207*  BUN 18  --  21*  CREATININE 1.85*  --  1.60*  CALCIUM 9.0  --  8.5*   F/u labs ordered.  Signed, Lorin Glass, MD [facility] 11/30/2019

## 2019-11-30 NOTE — Progress Notes (Signed)
Initial Nutrition Assessment  DOCUMENTATION CODES:   Not applicable  INTERVENTION:  Provide Ensure Enlive po BID, each supplement provides 350 kcal and 20 grams of protein.  Encourage adequate PO intake.   Diet education regarding CHF, low sodium discussed and given.   NUTRITION DIAGNOSIS:   Increased nutrient needs related to chronic illness (CHF) as evidenced by estimated needs.  GOAL:   Patient will meet greater than or equal to 90% of their needs  MONITOR:   PO intake, Supplement acceptance, Skin, Labs, I & O's, Weight trends  REASON FOR ASSESSMENT:   Consult Diet education  ASSESSMENT:   54 y.o. male with PMH significant for T2DM, hypertension and left eye blindness with central renal presents with progressively worsening intermittent shortness of breath and cough for a week culminating into severe symptoms and hypoxia. Chest x-ray noted cardiomegaly and diffuse vascular congestion with bilateral pulmonary edema. Echocardiogram done subsequently showed an EF of less than 20% with global hypokinesis, grade 1 diastolic dysfunction, severe LVH. Pt with acute combined systolic and diastolic CHF.   Meal completion has been 50%. Pt reports having a good appetite currently and PTA with no difficulties. Pt does report usual body weight of 250 lbs which he reports last weighed in 2015. Pt endorses gradual weight loss. RD to order nutritional supplements to aid in increased caloric and protein needs.   RD additionally consulted for diet education regarding new CHF. RD provided "Heart Failure Nutrition Therapy" handout from the [employer]. Reviewed patient's dietary recall. Provided examples on ways to decrease sodium intake in diet. Discouraged intake of processed foods and use of salt shaker. Encouraged fresh fruits and vegetables. RD discussed why it is important for patient to adhere to diet recommendations, and emphasized the role of fluids, foods to avoid.  Teach back method used. Expect fair to good compliance.  NUTRITION - FOCUSED PHYSICAL EXAM:    Most Recent Value  Orbital Region Unable to assess  Upper Arm Region No depletion  Thoracic and Lumbar Region No depletion  Buccal Region Unable to assess  Temple Region Unable to assess  Clavicle Bone Region Moderate depletion  Clavicle and Acromion Bone Region Moderate depletion  Scapular Bone Region Unable to assess  Dorsal Hand Unable to assess  Patellar Region Mild depletion  Anterior Thigh Region Moderate depletion  Posterior Calf Region Mild depletion  Edema (RD Assessment) None  Hair Reviewed  Eyes Reviewed  Mouth Reviewed  Skin Reviewed  Nails Reviewed     Labs and medications reviewed.   Diet Order:   Diet Order            Diet heart healthy/carb modified Room service appropriate? Yes; Fluid consistency: Thin; Fluid restriction: 1500 mL Fluid  Diet effective ____                 EDUCATION NEEDS:   Education needs have been addressed  Skin:  Skin Assessment: Reviewed RN Assessment  Last BM:  11/19  Height:   Ht Readings from Last 1 Encounters:  11/29/19 6\' 2"  (1.88 m)    Weight:   Wt Readings from Last 1 Encounters:  11/30/19 78.6 kg   BMI:  Body mass index is 22.25 kg/m.  Estimated Nutritional Needs:   Kcal:  2150-2350  Protein:  110-120 grams  Fluid:  1.5 L/day  12-19-1989, MS, RD, LDN RD pager number/after hours weekend pager number on Amion.

## 2019-11-30 NOTE — Progress Notes (Signed)
PT Cancellation Note  Patient Details Name: Kevin [name] MRN: [id number] DOB: [birth year]   Cancelled Treatment:    Reason Eval/Treat Not Completed: Medical issues which prohibited therapy;Patient not medically ready;Other (comment) (BP 179/130 upon arrival ). Nurse cleared pt for session with orders to maintain systolic BP < 180 and diastolic < 115. Upon arrival, pt supine in bed with HOB elevated with BP 179/130, thus held off on mobility for safety purposes. Will follow-up as able.     Raymond Gurney, PT, DPT Acute Rehabilitation Services  Pager: [phone] Office: [phone]   Jewel Baize 11/30/2019, 9:10 AM

## 2019-11-30 NOTE — Progress Notes (Signed)
Pt refusing CBG checks., Dr. Antionette Char informed.

## 2019-11-30 NOTE — Progress Notes (Signed)
ANTICOAGULATION CONSULT NOTE  Pharmacy Consult for Heparin Indication: chest pain/ACS  No Known Allergies  Patient Measurements: Height: 6\' 2"  (188 cm) Weight: 78.6 kg (173 lb 4.5 oz) IBW/kg (Calculated) : 82.2  Vital Signs: Temp: 97.6 F (36.4 C) (11/19 1217) Temp Source: Oral (11/19 1217) BP: 162/117 (11/19 1217) Pulse Rate: 96 (11/19 1217)  Labs: Recent Labs    11/29/19 0037 11/29/19 0037 11/29/19 0122 11/29/19 0237 11/30/19 0242 11/30/19 0912  HGB 14.6   < > 15.0  --  11.5*  --   HCT 48.4  --  44.0  --  36.5*  --   PLT 151  --   --   --  93*  --   HEPARINUNFRC  --   --   --   --  0.54 0.50  CREATININE 1.85*  --   --   --  1.60*  --   TROPONINIHS 675*  --   --  505*  --   --    < > = values in this interval not displayed.    Estimated Creatinine Clearance: 58 mL/min (A) (by C-G formula based on SCr of 1.6 mg/dL (H)).   Assessment: 54 y.o. male with elevated troponin/ACS for heparin. Heparin therapeutic x2. No plan for cath right now. Possible heparin x 48 hrs.   Goal of Therapy:  Heparin level 0.3-0.7 units/ml Monitor platelets by anticoagulation protocol: Yes   Plan:  Continue Heparin 1100 units/hr Daily HL and CBC  53, PharmD, BCIDP, AAHIVP, CPP Infectious Disease Pharmacist 11/30/2019 12:54 PM

## 2019-11-30 NOTE — Progress Notes (Signed)
ReDS Clip Diuretic Study Pt study # [id number]  Your patient is in the Blinded arm of the ReDS Clip Diuretic study.  Your patient has had a ReDS reading and the reading has been transmitted to the cloud.   Thank You   The research team   Sharen Hones, PharmD, BCPS Heart Failure Stewardship Pharmacist Phone [phone]  Please check [email or web address] for unit-specific pharmacist phone numbers

## 2019-12-01 LAB — BASIC METABOLIC PANEL
Anion gap: 12 (ref 5–15)
BUN: 16 mg/dL (ref 6–20)
CO2: 25 mmol/L (ref 22–32)
Calcium: 9.4 mg/dL (ref 8.9–10.3)
Chloride: 99 mmol/L (ref 98–111)
Creatinine, Ser: 1.36 mg/dL — ABNORMAL HIGH (ref 0.61–1.24)
GFR, Estimated: >60 mL/min (ref >60)
Glucose, Bld: 187 mg/dL — ABNORMAL HIGH (ref 70–99)
Potassium: 3.5 mmol/L (ref 3.5–5.1)
Sodium: 136 mmol/L (ref 135–145)

## 2019-12-01 LAB — CBC
HCT: 42.1 % (ref 39.0–52.0)
Hemoglobin: 13.7 g/dL (ref 13.0–17.0)
MCH: 28.7 pg (ref 26.0–34.0)
MCHC: 32.5 g/dL (ref 30.0–36.0)
MCV: 88.3 fL (ref 80.0–100.0)
Platelets: 122 10*3/uL — ABNORMAL LOW (ref 150–400)
RBC: 4.77 MIL/uL (ref 4.22–5.81)
RDW: 13 % (ref 11.5–15.5)
WBC: 7.5 10*3/uL (ref 4.0–10.5)
nRBC: 0 % (ref 0.0–0.2)

## 2019-12-01 LAB — GLUCOSE, CAPILLARY
Glucose-Capillary: 181 mg/dL — ABNORMAL HIGH (ref 70–99)
Glucose-Capillary: 274 mg/dL — ABNORMAL HIGH (ref 70–99)

## 2019-12-01 LAB — HEPATIC FUNCTION PANEL
ALT: 22 U/L (ref 0–44)
AST: 14 U/L — ABNORMAL LOW (ref 15–41)
Albumin: 3.4 g/dL — ABNORMAL LOW (ref 3.5–5.0)
Alkaline Phosphatase: 74 U/L (ref 38–126)
Bilirubin, Direct: 0.2 mg/dL (ref 0.0–0.2)
Indirect Bilirubin: 2.1 mg/dL — ABNORMAL HIGH (ref 0.3–0.9)
Total Bilirubin: 2.3 mg/dL — ABNORMAL HIGH (ref 0.3–1.2)
Total Protein: 6.7 g/dL (ref 6.5–8.1)

## 2019-12-01 LAB — LIPID PANEL
Cholesterol: 175 mg/dL (ref 0–200)
HDL: 62 mg/dL (ref >40)
LDL Cholesterol: 98 mg/dL (ref 0–99)
Total CHOL/HDL Ratio: 2.8 RATIO
Triglycerides: 75 mg/dL (ref <150)
VLDL: 15 mg/dL (ref 0–40)

## 2019-12-01 LAB — T3: T3, Total: 117 ng/dL (ref 71–180)

## 2019-12-01 MED ORDER — ENOXAPARIN SODIUM 80 MG/0.8ML ~~LOC~~ SOLN: 1.0000 mg/kg | Freq: Once | SUBCUTANEOUS | Status: DC

## 2019-12-01 MED ORDER — ENOXAPARIN SODIUM 80 MG/0.8ML ~~LOC~~ SOLN
80.0000 mg | Freq: Once | SUBCUTANEOUS | Status: AC
  Administered 2019-12-01: 80 mg via SUBCUTANEOUS
  Filled 2019-12-01: qty 0.8

## 2019-12-01 NOTE — Progress Notes (Signed)
Pharmacist Heart Failure Core Measure Documentation  Assessment: Kevin [name] has an EF documented as <20 on 11/29/19 by ECHO.  Rationale: Heart failure patients with left ventricular systolic dysfunction (LVSD) and an EF < 40% should be prescribed an angiotensin converting enzyme inhibitor (ACEI) or angiotensin receptor blocker (ARB) at discharge unless a contraindication is documented in the medical record.  This patient is not currently on an ACEI or ARB for HF.  This note is being placed in the record in order to provide documentation that a contraindication to the use of these agents is present for this encounter.  ACE Inhibitor or Angiotensin Receptor Blocker is contraindicated (specify all that apply)  []   ACEI allergy AND ARB allergy []   Angioedema []   Moderate or severe aortic stenosis []   Hyperkalemia []   Hypotension []   Renal artery stenosis [x]   Worsening renal function, preexisting renal disease or dysfunction   [facility] 12/01/2019 10:07 AM

## 2019-12-01 NOTE — Progress Notes (Addendum)
PROGRESS NOTE  Kevin [name] [place]  DOB: 1965-02-02  PCP: Medicine, [facility] [id number]  DOA: 11/29/2019  LOS: 2 days   Chief Complaint  Patient presents with  . Shortness of Breath   Brief narrative: Kevin [name] is a 54 y.o. 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 On 11/29/2019, patient was brought to the ED by EMS from home with progressively worsening intermittent shortness of breath and cough for a week culminating into severe symptoms and hypoxia on the night of presentation.  EMS noted oxygen saturation low at 82%, brought to ED on nonrebreather mask. Patient was previously on medication for diabetes and hypertension but he ran out 6 months prior and apparently restarted lisinopril and Metformin a week ago.  In the ED, patient was tachypneic, in tripod position, coughing up pink frothy sputum. Blood pressure was significantly elevated with systolic up to 244. Labs potassium 2.6, creatinine 1.85, BNP 1278, high-sensitivity troponin elevated to 675 >>505, WC count elevated to 14.3 Chest x-ray noted cardiomegaly and diffuse vascular congestion with bilateral pulmonary edema Respiration BiPAP with significant provement respiratory status. EKG showed sinus tachycardia with LVH, biatrial enlargement. He was started on IV nitroglycerin drip. Admitted to hospitalist service Echocardiogram done subsequently showed an EF of less than 20% with global hypokinesis, grade 1 diastolic dysfunction, severe LVH. Cardiology consultation was called.  Subjective: Patient was seen and examined this morning. Patient states he feels better he wants to go home.  He does not want to get any blood work done or any needlestick.  His blood pressure is still consistently elevated to 180s.  His blood pressure medications are being titrated by cardiology. He is not medically optimized to go  home.  Assessment/Plan: Acute combined systolic and diastolic CHF -Presented with progressively worsening shortness of breath for a week, uncontrolled blood pressure, pulmonary when chest x-ray -echocardiogram with EF 20% and grade 1 diastolic dysfunction -Cardiology following. -Does not seem volume overloaded.  Not on Lasix. -Currently on heart failure regimen per cardiology.  Discussed with cardiologist Dr. Anne Fu this morning. -Because of noncompliance issues, is not planned for ischemic evaluation.  Hypertensive emergency Uncontrolled hypertension -Presented with systolic blood pressure more than 240 -Currently meds being titrated by cardiology. -IV hydralazine as needed. -Urinalysis for proteinuria.  Elevated troponin -Not complaining of chest pain -Not a candidate for ischemic work-up because of noncompliance. -He was placed on heparin drip.  However with IV site stopped working and he did not want another IV line placed.  Not on heparin drip this morning. -Continue aspirin and statin. Recent Labs    11/29/19 0037 11/29/19 0237  TROPONINIHS 675* 505*   Type 2 diabetes mellitus -A1c 6.7 on 11/18 -Metformin on hold. -Currently on sliding scale insulin with Accu-Cheks.   Recent Labs  Lab 11/29/19 2106 11/30/19 0558 11/30/19 1213 11/30/19 1534 12/01/19 0624  GLUCAP 171* 154* 183* 169* 181*   Hypokalemia -Potassium was low at 2.6 on admission.  Improved with replacement. Recent Labs  Lab 11/29/19 0037 11/29/19 0122 11/30/19 0242 12/01/19 0743  K 2.6* 2.7* 3.6 3.5   Tobacco/marijuana use -Counseled to quit.  Mobility: Encourage ambulation Code Status:   Code Status: Full Code  Nutritional status: Body mass index is 21.63 kg/m. Nutrition Problem: Increased nutrient needs Etiology: chronic illness (CHF) Signs/Symptoms: estimated needs Diet Order            Diet heart healthy/carb modified Room service appropriate? Yes; Fluid  consistency: Thin; Fluid  restriction: 1500 mL Fluid  Diet effective ____                 DVT prophylaxis:  Heparin drip    Antimicrobials:  None  Fluid: None Consultants: Cardiology Family Communication:  None at bedside  Status is: Inpatient  Remains inpatient appropriate because -needs further medical adjustment  Dispo: The patient is from: Home              Anticipated d/c is to: Home              Anticipated d/c date is: 2 to 3 days              Patient currently is not medically stable to d/c.  Blood pressure not yet optimized to go home.  Infusions:  . sodium chloride      Scheduled Meds: . amLODipine  10 mg Oral Daily  . aspirin EC  81 mg Oral Daily  . atorvastatin  40 mg Oral Daily  . carvedilol  6.25 mg Oral BID WC  . feeding supplement  237 mL Oral BID BM  . hydrALAZINE  50 mg Oral Q8H  . insulin aspart  0-15 Units Subcutaneous TID WC  . insulin aspart  0-5 Units Subcutaneous QHS  . isosorbide mononitrate  60 mg Oral Daily  . nicotine  14 mg Transdermal Daily  . sodium chloride flush  3 mL Intravenous Q12H    Antimicrobials: Anti-infectives (From admission, onward)   None      PRN meds: sodium chloride, acetaminophen, hydrALAZINE, labetalol, ondansetron (ZOFRAN) IV, sodium chloride flush   Objective: Vitals:   12/01/19 0552 12/01/19 0830  BP: (!) 178/111 (!) 174/110  Pulse: 81 86  Resp:  18  Temp:  97.8 F (36.6 C)  SpO2:  100%    Intake/Output Summary (Last 24 hours) at 12/01/2019 1042 Last data filed at 12/01/2019 0934 Gross per 24 hour  Intake 390 ml  Output 1250 ml  Net -860 ml   Filed Weights   11/29/19 1400 11/30/19 0548 12/01/19 0427  Weight: 78.6 kg 78.6 kg 76.4 kg   Weight change: -12.1 kg Body mass index is 21.63 kg/m.   Physical Exam: General exam: Not in physical distress Skin: No rashes, lesions or ulcers. HEENT: Atraumatic, normocephalic, no obvious bleeding Lungs: Clear to auscultation bilaterally CVS: Regular rate and rhythm, no  murmur GI/Abd soft, nontender, nondistended, bowel sound present CNS: Alert, awake, oriented x3 Psychiatry: Eager to get out of here Extremities: No pedal edema, no calf tenderness  Data Review: I have personally reviewed the laboratory data and studies available.  Recent Labs  Lab 11/29/19 0037 11/29/19 0122 11/30/19 0242 12/01/19 0743  WBC 14.3*  --  8.5 7.5  NEUTROABS 8.2*  --  5.3  --   HGB 14.6 15.0 11.5* 13.7  HCT 48.4 44.0 36.5* 42.1  MCV 94.2  --  89.9 88.3  PLT 151  --  93* 122*   Recent Labs  Lab 11/29/19 0037 11/29/19 0122 11/30/19 0242 12/01/19 0743  NA 140 137 139 136  K 2.6* 2.7* 3.6 3.5  CL 100  --  104 99  CO2 27  --  28 25  GLUCOSE 350*  --  207* 187*  BUN 18  --  21* 16  CREATININE 1.85*  --  1.60* 1.36*  CALCIUM 9.0  --  8.5* 9.4   F/u labs ordered.  Signed, Lorin Glass, MD [facility] 12/01/2019

## 2019-12-01 NOTE — Progress Notes (Signed)
Pt informed RN of IV coming out at 0248. Dr. Antionette Char and Tammy Sours (pharm rep) informed.

## 2019-12-01 NOTE — Progress Notes (Signed)
Patient decided to leave AMA after being strongly advised to stay by family, the physician and the nurse that he was not medically ready to discharge. PIV and tele removed. AMA form signed and patient expressed understanding.

## 2019-12-01 NOTE — Progress Notes (Signed)
ANTICOAGULATION CONSULT NOTE  Pharmacy Consult for Heparin>lovenox Indication: chest pain/ACS  No Known Allergies  Patient Measurements: Height: 6\' 2"  (188 cm) Weight: 76.4 kg (168 lb 6.9 oz) IBW/kg (Calculated) : 82.2  Vital Signs: Temp: 98.3 F (36.8 C) (11/20 0426) Temp Source: Oral (11/20 0426) BP: 178/111 (11/20 0552) Pulse Rate: 81 (11/20 0552)  Labs: Recent Labs    11/29/19 0037 11/29/19 0037 11/29/19 0122 11/29/19 0237 11/30/19 0242 11/30/19 0912  HGB 14.6   < > 15.0  --  11.5*  --   HCT 48.4  --  44.0  --  36.5*  --   PLT 151  --   --   --  93*  --   HEPARINUNFRC  --   --   --   --  0.54 0.50  CREATININE 1.85*  --   --   --  1.60*  --   TROPONINIHS 675*  --   --  505*  --   --    < > = values in this interval not displayed.    Estimated Creatinine Clearance: 56.4 mL/min (A) (by C-G formula based on SCr of 1.6 mg/dL (H)).   Assessment: 54 y.o. male with elevated troponin/ACS for heparin. IV line was out last night for an unknown amount of time. Plan is to stop IV heparin this afternoon. D/w [place] and we will just give one full dose of Lovenox only.    Goal of Therapy:  Anti-Xa- 0.6-1 Monitor platelets by anticoagulation protocol: Yes   Plan:  Dc heparin Lovenox 80mg  SQ x1  53, PharmD, BCIDP, AAHIVP, CPP Infectious Disease Pharmacist 12/01/2019 7:42 AM

## 2019-12-01 NOTE — Progress Notes (Signed)
Pt allowed CBG check, but is refusing sliding scale insulin d/t not liking needle sticks.

## 2019-12-01 NOTE — Progress Notes (Addendum)
Progress Note  Patient Name: Kevin [name] [place] Date of Encounter: 12/01/2019  [facility] Cardiologist: Meriam Sprague, MD   Subjective   Feeling better he states.  Wants to know when he can get out of here.  Inpatient Medications    Scheduled Meds: . amLODipine  10 mg Oral Daily  . aspirin EC  81 mg Oral Daily  . atorvastatin  40 mg Oral Daily  . carvedilol  6.25 mg Oral BID WC  . feeding supplement  237 mL Oral BID BM  . hydrALAZINE  50 mg Oral Q8H  . insulin aspart  0-15 Units Subcutaneous TID WC  . insulin aspart  0-5 Units Subcutaneous QHS  . isosorbide mononitrate  60 mg Oral Daily  . nicotine  14 mg Transdermal Daily  . sodium chloride flush  3 mL Intravenous Q12H   Continuous Infusions: . sodium chloride     PRN Meds: sodium chloride, acetaminophen, hydrALAZINE, labetalol, ondansetron (ZOFRAN) IV, sodium chloride flush   Vital Signs    Vitals:   12/01/19 0427 12/01/19 0551 12/01/19 0552 12/01/19 0830  BP:  (!) 177/116 (!) 178/111 (!) 174/110  Pulse:  88 81 86  Resp:    18  Temp:    97.8 F (36.6 C)  TempSrc:    Oral  SpO2:  100%  100%  Weight: 76.4 kg     Height:        Intake/Output Summary (Last 24 hours) at 12/01/2019 1027 Last data filed at 12/01/2019 0934 Gross per 24 hour  Intake 390 ml  Output 1250 ml  Net -860 ml   Last 3 Weights 12/01/2019 11/30/2019 11/29/2019  Weight (lbs) 168 lb 6.9 oz 173 lb 4.5 oz 173 lb 4.5 oz  Weight (kg) 76.4 kg 78.6 kg 78.6 kg      Telemetry    No adverse arrhythmias- Personally Reviewed  ECG    No new- Personally Reviewed  Physical Exam   GEN: No acute distress.   Neck: No JVD Cardiac: RRR, no murmurs, rubs, or gallops.  Respiratory: Clear to auscultation bilaterally. GI: Soft, nontender, non-distended  MS: No edema; No deformity. Neuro:  Nonfocal  Psych: Normal affect   Labs    High Sensitivity Troponin:   Recent Labs  Lab 11/29/19 0037 11/29/19 0237  TROPONINIHS 675* 505*        Chemistry Recent Labs  Lab 11/29/19 0037 11/29/19 0037 11/29/19 0122 11/30/19 0242 11/30/19 1125 12/01/19 0743  NA 140   < > 137 139  --  136  K 2.6*   < > 2.7* 3.6  --  3.5  CL 100  --   --  104  --  99  CO2 27  --   --  28  --  25  GLUCOSE 350*  --   --  207*  --  187*  BUN 18  --   --  21*  --  16  CREATININE 1.85*  --   --  1.60*  --  1.36*  CALCIUM 9.0  --   --  8.5*  --  9.4  PROT 6.8  --   --   --  6.7 6.7  ALBUMIN 3.7  --   --   --  3.5 3.4*  AST 48*  --   --   --  19 14*  ALT 45*  --   --   --  31 22  ALKPHOS 94  --   --   --  77 74  BILITOT 0.6  --   --   --  1.6* 2.3*  GFRNONAA 42*  --   --  51*  --  >60  ANIONGAP 13  --   --  7  --  12   < > = values in this interval not displayed.     Hematology Recent Labs  Lab 11/29/19 0037 11/29/19 0037 11/29/19 0122 11/30/19 0242 12/01/19 0743  WBC 14.3*  --   --  8.5 7.5  RBC 5.14  --   --  4.06* 4.77  HGB 14.6   < > 15.0 11.5* 13.7  HCT 48.4   < > 44.0 36.5* 42.1  MCV 94.2  --   --  89.9 88.3  MCH 28.4  --   --  28.3 28.7  MCHC 30.2  --   --  31.5 32.5  RDW 13.2  --   --  13.1 13.0  PLT 151  --   --  93* 122*   < > = values in this interval not displayed.    BNP Recent Labs  Lab 11/29/19 0037  BNP 1,278.7*     DDimer No results for input(s): DDIMER in the last 168 hours.   Radiology    ECHOCARDIOGRAM COMPLETE  Result Date: 11/29/2019    ECHOCARDIOGRAM REPORT   Patient Name:   Kevin [name] Date of Exam: 11/29/2019 Medical Rec #:  [id number]        Height: Accession #:    [id number]       Weight: Date of Birth:  06/29/1965        BSA: Patient Age:    54 years         BP:           129/101 mmHg Patient Gender: M                HR:           79 bpm. Exam Location:  Inpatient Procedure: 2D Echo, Cardiac Doppler and Color Doppler Indications:    CHF-Acute Systolic 428.21 / I50.21  History:        Patient has no prior history of Echocardiogram examinations.                 Risk Factors:Hypertension,  Diabetes and Current Smoker.  Sonographer:    Renella CunasJulia Swaim RDCS Referring Phys: [id number] JENNIFER YATES IMPRESSIONS  1. Left ventricular ejection fraction, by estimation, is <20%. The left ventricle has severely decreased function. The left ventricle demonstrates global hypokinesis. The left ventricular internal cavity size was moderately dilated. There is severe left  ventricular hypertrophy. Left ventricular diastolic parameters are consistent with Grade I diastolic dysfunction (impaired relaxation).  2. Right ventricular systolic function is moderately reduced. The right ventricular size is normal. Tricuspid regurgitation signal is inadequate for assessing PA pressure.  3. Left atrial size was mildly dilated.  4. The mitral valve is normal in structure. Mild mitral valve regurgitation.  5. The aortic valve is tricuspid. Aortic valve regurgitation is not visualized. No aortic stenosis is present.  6. Aortic dilatation noted. There is mild dilatation of the aortic root, measuring 41 mm.  7. The inferior vena cava is normal in size with greater than 50% respiratory variability, suggesting right atrial pressure of 3 mmHg. FINDINGS  Left Ventricle: Left ventricular ejection fraction, by estimation, is <20%. The left ventricle has severely decreased function. The left ventricle demonstrates global hypokinesis. The left ventricular internal cavity size was moderately  dilated. There is severe left ventricular hypertrophy. Left ventricular diastolic parameters are consistent with Grade I diastolic dysfunction (impaired relaxation). Right Ventricle: The right ventricular size is normal. Right vetricular wall thickness was not assessed. Right ventricular systolic function is moderately reduced. Tricuspid regurgitation signal is inadequate for assessing PA pressure. Left Atrium: Left atrial size was mildly dilated. Right Atrium: Right atrial size was normal in size. Pericardium: There is no evidence of pericardial effusion.  Mitral Valve: The mitral valve is normal in structure. Mild mitral valve regurgitation. Tricuspid Valve: The tricuspid valve is normal in structure. Tricuspid valve regurgitation is trivial. Aortic Valve: The aortic valve is tricuspid. Aortic valve regurgitation is not visualized. No aortic stenosis is present. Pulmonic Valve: The pulmonic valve was normal in structure. Pulmonic valve regurgitation is trivial. Aorta: Aortic dilatation noted. There is mild dilatation of the aortic root, measuring 41 mm. Venous: The inferior vena cava is normal in size with greater than 50% respiratory variability, suggesting right atrial pressure of 3 mmHg. IAS/Shunts: The interatrial septum was not well visualized.  LEFT VENTRICLE PLAX 2D LVIDd:         6.80 cm      Diastology LVIDs:         6.30 cm      LV e' medial:    3.02 cm/s LV PW:         1.30 cm      LV E/e' medial:  11.4 LV IVS:        1.30 cm      LV e' lateral:   3.09 cm/s LVOT diam:     2.20 cm      LV E/e' lateral: 11.1 LV SV:         41 LVOT Area:     3.80 cm  LV Volumes (MOD) LV vol d, MOD A2C: 294.0 ml LV vol d, MOD A4C: 266.0 ml LV vol s, MOD A2C: 237.0 ml LV vol s, MOD A4C: 198.0 ml LV SV MOD A2C:     57.0 ml LV SV MOD A4C:     266.0 ml LV SV MOD BP:      54.4 ml RIGHT VENTRICLE RV S prime:     6.32 cm/s TAPSE (M-mode): 1.3 cm LEFT ATRIUM           RIGHT ATRIUM LA diam:      4.70 cm RA Area:     11.80 cm LA Vol (A2C): 61.9 ml RA Volume:   24.60 ml LA Vol (A4C): 66.2 ml  AORTIC VALVE LVOT Vmax:   71.80 cm/s LVOT Vmean:  46.900 cm/s LVOT VTI:    0.108 m  AORTA Ao Root diam: 4.10 cm Ao Asc diam:  3.50 cm MITRAL VALVE MV Area (PHT): 3.42 cm    SHUNTS MV Decel Time: 222 msec    Systemic VTI:  0.11 m MV E velocity: 34.30 cm/s  Systemic Diam: 2.20 cm MV A velocity: 62.40 cm/s MV E/A ratio:  0.55 Epifanio Lesches MD Electronically signed by Epifanio Lesches MD Signature Date/Time: 11/29/2019/3:22:14 PM    Final     Cardiac Studies   Echo EF less than  20  Patient Profile     54 y.o. male with acute systolic heart failure, hypertensive urgency  Assessment & Plan    Acute combined systolic and diastolic heart failure -Suspicion is underlying hypertensive heart disease.  Blood pressures been very challenging to control. -EF less than 20% on echocardiogram. -Amlodipine carvedilol isosorbide hydralazine. -ACE inhibitor's are  being withheld secondary to AKI. -Prior note reviewed.  Extensive discussion with patient and family.  Severity of illness.  Compliance has been a major factor in his overall care.  Unfortunately challenging insight.  Wants to go home.  It would be optimal for [place] to see some improvement in his blood pressure prior to discharge.  Discussed with primary team     For questions or updates, please contact [facility] Please consult [email or web address] for contact info under        Signed, Donato Schultz, MD  12/01/2019, 10:27 AM

## 2019-12-02 NOTE — Discharge Summary (Addendum)
Discharge AGAINST MEDICAL ADVICE  Kevin [name] DOA: 11/29/2019  PCP: Medicine, [facility]  Admit date: 11/29/2019 Discharge date: 12/02/2019 left AMA  Admitted From: Home   Code Status: Prior   Discharge Diagnosis:   Principal Problem:   Hypertensive emergency Active Problems:   Diabetes mellitus without complication (HCC)   Tobacco dependence   Marijuana abuse   Renal dysfunction   Hypokalemia    History of Present Illness / Brief narrative:  Kevin [name] is a 54 y.o. 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 On 11/29/2019, patient was brought to the ED by EMS from home with progressively worsening intermittent shortness of breath and cough for a week culminating into severe symptoms and hypoxia on the night of presentation.  EMS noted oxygen saturation low at 82%, brought to ED on nonrebreather mask. Patient was previously on medication for diabetes and hypertension but he ran out 6 months prior and apparently restarted lisinopril and Metformin a week ago. In the ED, patient was tachypneic, in tripod position, coughing up pink frothy sputum. Blood pressure was significantly elevated with systolic up to 244. Labs potassium 2.6, creatinine 1.85, BNP 1278, high-sensitivity troponin elevated to 675 >>505, WC count elevated to 14.3 Chest x-ray noted cardiomegaly and diffuse vascular congestion with bilateral pulmonary edema Respiration BiPAP with significant provement respiratory status. EKG showed sinus tachycardia with LVH, biatrial enlargement. He was started on IV nitroglycerin drip. Admitted to hospitalist service Echocardiogram done subsequently showed an EF of less than 20% with global hypokinesis, grade 1 diastolic dysfunction, severe LVH. Cardiology consultation was called.  Hospital Course:  Patient left AGAINST MEDICAL ADVICE despite counseling by  myself, cardiologist and other staff as well as by his family. Acute issues with plans while admitted in the hospital as below:  Acute exacerbation of combined systolic and diastolic CHF Hypertensive emergency Elevated troponin Type diabetes mellitus Hypokalemia  Medical Consultants:    Cardiology   Discharge Exam:  Not applicable.   Discharge Instructions:   The results of significant diagnostics from this hospitalization (including imaging, microbiology, ancillary and laboratory) are listed below for reference.    Procedures and Diagnostic Studies:   DG Chest Port 1 View  Result Date: 11/29/2019 CLINICAL DATA:  A initial evaluation for acute shortness of breath. EXAM: PORTABLE CHEST 1 VIEW COMPARISON:  None available. FINDINGS: Advanced cardiomegaly.  Mediastinal silhouette within normal limits. Lungs well inflated. Diffuse pulmonary vascular congestion with bilateral airspace disease, favored to reflect moderate diffuse pulmonary edema. Probable superimposed small left pleural effusion. Multifocal infection difficult to exclude, and could be considered in the correct clinical setting. No pneumothorax. No acute osseous finding.  Thoracolumbar scoliosis noted. IMPRESSION: 1. Cardiomegaly with diffuse vascular congestion and bilateral airspace opacities, favored to reflect moderate diffuse pulmonary edema. Multifocal infection is difficult to exclude, and could be considered in the correct clinical setting. 2. Suspected small left pleural effusion. Electronically Signed   By: Rise Mu M.D.   On: 11/29/2019 01:50   ECHOCARDIOGRAM COMPLETE  Result Date: 11/29/2019    ECHOCARDIOGRAM REPORT   Patient Name:   Kevin [name] Date of Exam: 11/29/2019 Medical Rec #:  [id number]        Height: Accession #:    [id number]       Weight: Date of Birth:  02-29-1965        BSA: Patient Age:    54 years  BP:           129/101 mmHg Patient Gender: M                HR:           79  bpm. Exam Location:  Inpatient Procedure: 2D Echo, Cardiac Doppler and Color Doppler Indications:    CHF-Acute Systolic 428.21 / I50.21  History:        Patient has no prior history of Echocardiogram examinations.                 Risk Factors:Hypertension, Diabetes and Current Smoker.  Sonographer:    Renella CunasJulia Swaim RDCS Referring Phys: [id number] JENNIFER YATES IMPRESSIONS  1. Left ventricular ejection fraction, by estimation, is <20%. The left ventricle has severely decreased function. The left ventricle demonstrates global hypokinesis. The left ventricular internal cavity size was moderately dilated. There is severe left  ventricular hypertrophy. Left ventricular diastolic parameters are consistent with Grade I diastolic dysfunction (impaired relaxation).  2. Right ventricular systolic function is moderately reduced. The right ventricular size is normal. Tricuspid regurgitation signal is inadequate for assessing PA pressure.  3. Left atrial size was mildly dilated.  4. The mitral valve is normal in structure. Mild mitral valve regurgitation.  5. The aortic valve is tricuspid. Aortic valve regurgitation is not visualized. No aortic stenosis is present.  6. Aortic dilatation noted. There is mild dilatation of the aortic root, measuring 41 mm.  7. The inferior vena cava is normal in size with greater than 50% respiratory variability, suggesting right atrial pressure of 3 mmHg. FINDINGS  Left Ventricle: Left ventricular ejection fraction, by estimation, is <20%. The left ventricle has severely decreased function. The left ventricle demonstrates global hypokinesis. The left ventricular internal cavity size was moderately dilated. There is severe left ventricular hypertrophy. Left ventricular diastolic parameters are consistent with Grade I diastolic dysfunction (impaired relaxation). Right Ventricle: The right ventricular size is normal. Right vetricular wall thickness was not assessed. Right ventricular systolic function is  moderately reduced. Tricuspid regurgitation signal is inadequate for assessing PA pressure. Left Atrium: Left atrial size was mildly dilated. Right Atrium: Right atrial size was normal in size. Pericardium: There is no evidence of pericardial effusion. Mitral Valve: The mitral valve is normal in structure. Mild mitral valve regurgitation. Tricuspid Valve: The tricuspid valve is normal in structure. Tricuspid valve regurgitation is trivial. Aortic Valve: The aortic valve is tricuspid. Aortic valve regurgitation is not visualized. No aortic stenosis is present. Pulmonic Valve: The pulmonic valve was normal in structure. Pulmonic valve regurgitation is trivial. Aorta: Aortic dilatation noted. There is mild dilatation of the aortic root, measuring 41 mm. Venous: The inferior vena cava is normal in size with greater than 50% respiratory variability, suggesting right atrial pressure of 3 mmHg. IAS/Shunts: The interatrial septum was not well visualized.  LEFT VENTRICLE PLAX 2D LVIDd:         6.80 cm      Diastology LVIDs:         6.30 cm      LV e' medial:    3.02 cm/s LV PW:         1.30 cm      LV E/e' medial:  11.4 LV IVS:        1.30 cm      LV e' lateral:   3.09 cm/s LVOT diam:     2.20 cm      LV E/e' lateral: 11.1 LV SV:  41 LVOT Area:     3.80 cm  LV Volumes (MOD) LV vol d, MOD A2C: 294.0 ml LV vol d, MOD A4C: 266.0 ml LV vol s, MOD A2C: 237.0 ml LV vol s, MOD A4C: 198.0 ml LV SV MOD A2C:     57.0 ml LV SV MOD A4C:     266.0 ml LV SV MOD BP:      54.4 ml RIGHT VENTRICLE RV S prime:     6.32 cm/s TAPSE (M-mode): 1.3 cm LEFT ATRIUM           RIGHT ATRIUM LA diam:      4.70 cm RA Area:     11.80 cm LA Vol (A2C): 61.9 ml RA Volume:   24.60 ml LA Vol (A4C): 66.2 ml  AORTIC VALVE LVOT Vmax:   71.80 cm/s LVOT Vmean:  46.900 cm/s LVOT VTI:    0.108 m  AORTA Ao Root diam: 4.10 cm Ao Asc diam:  3.50 cm MITRAL VALVE MV Area (PHT): 3.42 cm    SHUNTS MV Decel Time: 222 msec    Systemic VTI:  0.11 m MV E velocity:  34.30 cm/s  Systemic Diam: 2.20 cm MV A velocity: 62.40 cm/s MV E/A ratio:  0.55 Epifanio Lesches MD Electronically signed by Epifanio Lesches MD Signature Date/Time: 11/29/2019/3:22:14 PM    Final      Labs:   Basic Metabolic Panel: Recent Labs  Lab 11/29/19 0037 11/29/19 0037 11/29/19 0122 11/29/19 0122 11/30/19 0242 12/01/19 0743  NA 140  --  137  --  139 136  K 2.6*   < > 2.7*   < > 3.6 3.5  CL 100  --   --   --  104 99  CO2 27  --   --   --  28 25  GLUCOSE 350*  --   --   --  207* 187*  BUN 18  --   --   --  21* 16  CREATININE 1.85*  --   --   --  1.60* 1.36*  CALCIUM 9.0  --   --   --  8.5* 9.4   < > = values in this interval not displayed.   GFR Estimated Creatinine Clearance: 66.3 mL/min (A) (by C-G formula based on SCr of 1.36 mg/dL (H)). Liver Function Tests: Recent Labs  Lab 11/29/19 0037 11/30/19 1125 12/01/19 0743  AST 48* 19 14*  ALT 45* 31 22  ALKPHOS 94 77 74  BILITOT 0.6 1.6* 2.3*  PROT 6.8 6.7 6.7  ALBUMIN 3.7 3.5 3.4*   No results for input(s): LIPASE, AMYLASE in the last 168 hours. No results for input(s): AMMONIA in the last 168 hours. Coagulation profile No results for input(s): INR, PROTIME in the last 168 hours.  CBC: Recent Labs  Lab 11/29/19 0037 11/29/19 0122 11/30/19 0242 12/01/19 0743  WBC 14.3*  --  8.5 7.5  NEUTROABS 8.2*  --  5.3  --   HGB 14.6 15.0 11.5* 13.7  HCT 48.4 44.0 36.5* 42.1  MCV 94.2  --  89.9 88.3  PLT 151  --  93* 122*   Cardiac Enzymes: No results for input(s): CKTOTAL, CKMB, CKMBINDEX, TROPONINI in the last 168 hours. BNP: Invalid input(s): POCBNP CBG: Recent Labs  Lab 11/30/19 0558 11/30/19 1213 11/30/19 1534 12/01/19 0624 12/01/19 1117  GLUCAP 154* 183* 169* 181* 274*   D-Dimer No results for input(s): DDIMER in the last 72 hours. Hgb A1c Recent Labs    11/29/19  1428  HGBA1C 6.7*   Lipid Profile Recent Labs    11/29/19 1428 12/01/19 0743  CHOL 161 175  HDL 51 62  LDLCALC  100* 98  TRIG 50 75  CHOLHDL 3.2 2.8   Thyroid function studies Recent Labs    11/30/19 1125  TSH 0.447   Anemia work up No results for input(s): VITAMINB12, FOLATE, FERRITIN, TIBC, IRON, RETICCTPCT in the last 72 hours. Microbiology Recent Results (from the past 240 hour(s))  Respiratory Panel by RT PCR (Flu A&B, Covid) - Nasopharyngeal Swab     Status: None   Collection Time: 11/29/19  2:53 AM   Specimen: Nasopharyngeal Swab  Result Value Ref Range Status   SARS Coronavirus 2 by RT PCR NEGATIVE NEGATIVE Final    Comment: (NOTE) SARS-CoV-2 target nucleic acids are NOT DETECTED.  The SARS-CoV-2 RNA is generally detectable in upper respiratoy specimens during the acute phase of infection. The lowest concentration of SARS-CoV-2 viral copies this assay can detect is 131 copies/mL. A negative result does not preclude SARS-Cov-2 infection and should not be used as the sole basis for treatment or other patient management decisions. A negative result may occur with  improper specimen collection/handling, submission of specimen other than nasopharyngeal swab, presence of viral mutation(s) within the areas targeted by this assay, and inadequate number of viral copies (<131 copies/mL). A negative result must be combined with clinical observations, patient history, and epidemiological information. The expected result is Negative.  Fact Sheet for Patients:  https://www.moore.com/  Fact Sheet for Healthcare Providers:  https://www.young.biz/  This test is no t yet approved or cleared by the [place] FDA and  has been authorized for detection and/or diagnosis of SARS-CoV-2 by FDA under an Emergency Use Authorization (EUA). This EUA will remain  in effect (meaning this test can be used) for the duration of the COVID-19 declaration under Section 564(b)(1) of the Act, 21 U.S.C. section 360bbb-3(b)(1), unless the authorization is terminated  or revoked sooner.     Influenza A by PCR NEGATIVE NEGATIVE Final   Influenza B by PCR NEGATIVE NEGATIVE Final    Comment: (NOTE) The Xpert Xpress SARS-CoV-2/FLU/RSV assay is intended as an aid in  the diagnosis of influenza from Nasopharyngeal swab specimens and  should not be used as a sole basis for treatment. Nasal washings and  aspirates are unacceptable for Xpert Xpress SARS-CoV-2/FLU/RSV  testing.  Fact Sheet for Patients: https://www.moore.com/  Fact Sheet for Healthcare Providers: https://www.young.biz/  This test is not yet approved or cleared by the [place] FDA and  has been authorized for detection and/or diagnosis of SARS-CoV-2 by  FDA under an Emergency Use Authorization (EUA). This EUA will remain  in effect (meaning this test can be used) for the duration of the  Covid-19 declaration under Section 564(b)(1) of the Act, 21  U.S.C. section 360bbb-3(b)(1), unless the authorization is  terminated or revoked. Performed at [facility], [address] [place], [place] [address]     Signed: Lorin Glass  [facility] 12/02/2019, 1:42 PM

## 2020-01-04 ENCOUNTER — Emergency Department (HOSPITAL_COMMUNITY): Payer: Medicaid Other

## 2020-01-04 ENCOUNTER — Encounter (HOSPITAL_COMMUNITY): Payer: Self-pay | Admitting: Obstetrics and Gynecology

## 2020-01-04 ENCOUNTER — Inpatient Hospital Stay (HOSPITAL_COMMUNITY)
Admission: EM | Admit: 2020-01-04 | Discharge: 2020-01-08 | DRG: 064 | Disposition: A | Discharge status: Home Health | Payer: Medicaid Other | Attending: Internal Medicine | Admitting: Internal Medicine

## 2020-01-04 ENCOUNTER — Other Ambulatory Visit: Payer: Self-pay

## 2020-01-04 DIAGNOSIS — R29709 NIHSS score 9: Secondary | ICD-10-CM | POA: Diagnosis present

## 2020-01-04 DIAGNOSIS — Z20822 Contact with and (suspected) exposure to covid-19: Secondary | ICD-10-CM | POA: Diagnosis present

## 2020-01-04 DIAGNOSIS — I5043 Acute on chronic combined systolic (congestive) and diastolic (congestive) heart failure: Secondary | ICD-10-CM | POA: Diagnosis present

## 2020-01-04 DIAGNOSIS — I639 Cerebral infarction, unspecified: Secondary | ICD-10-CM | POA: Diagnosis present

## 2020-01-04 DIAGNOSIS — R4189 Other symptoms and signs involving cognitive functions and awareness: Secondary | ICD-10-CM | POA: Diagnosis present

## 2020-01-04 DIAGNOSIS — I6501 Occlusion and stenosis of right vertebral artery: Secondary | ICD-10-CM | POA: Diagnosis not present

## 2020-01-04 DIAGNOSIS — Z79899 Other long term (current) drug therapy: Secondary | ICD-10-CM | POA: Diagnosis not present

## 2020-01-04 DIAGNOSIS — Z9119 Patient's noncompliance with other medical treatment and regimen: Secondary | ICD-10-CM

## 2020-01-04 DIAGNOSIS — F1210 Cannabis abuse, uncomplicated: Secondary | ICD-10-CM | POA: Diagnosis present

## 2020-01-04 DIAGNOSIS — G8191 Hemiplegia, unspecified affecting right dominant side: Secondary | ICD-10-CM | POA: Diagnosis present

## 2020-01-04 DIAGNOSIS — I130 Hypertensive heart and chronic kidney disease with heart failure and stage 1 through stage 4 chronic kidney disease, or unspecified chronic kidney disease: Secondary | ICD-10-CM | POA: Diagnosis present

## 2020-01-04 DIAGNOSIS — G9341 Metabolic encephalopathy: Secondary | ICD-10-CM | POA: Diagnosis present

## 2020-01-04 DIAGNOSIS — I10 Essential (primary) hypertension: Secondary | ICD-10-CM

## 2020-01-04 DIAGNOSIS — N1832 Chronic kidney disease, stage 3b: Secondary | ICD-10-CM | POA: Diagnosis present

## 2020-01-04 DIAGNOSIS — E785 Hyperlipidemia, unspecified: Secondary | ICD-10-CM | POA: Diagnosis not present

## 2020-01-04 DIAGNOSIS — H5462 Unqualified visual loss, left eye, normal vision right eye: Secondary | ICD-10-CM | POA: Diagnosis present

## 2020-01-04 DIAGNOSIS — Z8673 Personal history of transient ischemic attack (TIA), and cerebral infarction without residual deficits: Secondary | ICD-10-CM | POA: Insufficient documentation

## 2020-01-04 DIAGNOSIS — R944 Abnormal results of kidney function studies: Secondary | ICD-10-CM | POA: Diagnosis not present

## 2020-01-04 DIAGNOSIS — N183 Chronic kidney disease, stage 3 unspecified: Secondary | ICD-10-CM | POA: Diagnosis present

## 2020-01-04 DIAGNOSIS — I248 Other forms of acute ischemic heart disease: Secondary | ICD-10-CM | POA: Diagnosis present

## 2020-01-04 DIAGNOSIS — F17200 Nicotine dependence, unspecified, uncomplicated: Secondary | ICD-10-CM | POA: Diagnosis present

## 2020-01-04 DIAGNOSIS — E1122 Type 2 diabetes mellitus with diabetic chronic kidney disease: Secondary | ICD-10-CM | POA: Diagnosis present

## 2020-01-04 DIAGNOSIS — Z8249 Family history of ischemic heart disease and other diseases of the circulatory system: Secondary | ICD-10-CM

## 2020-01-04 DIAGNOSIS — T501X5A Adverse effect of loop [high-ceiling] diuretics, initial encounter: Secondary | ICD-10-CM | POA: Diagnosis not present

## 2020-01-04 DIAGNOSIS — I160 Hypertensive urgency: Secondary | ICD-10-CM | POA: Diagnosis present

## 2020-01-04 DIAGNOSIS — F17210 Nicotine dependence, cigarettes, uncomplicated: Secondary | ICD-10-CM | POA: Diagnosis not present

## 2020-01-04 DIAGNOSIS — Z23 Encounter for immunization: Secondary | ICD-10-CM

## 2020-01-04 DIAGNOSIS — I6340 Cerebral infarction due to embolism of unspecified cerebral artery: Secondary | ICD-10-CM | POA: Diagnosis present

## 2020-01-04 DIAGNOSIS — Z833 Family history of diabetes mellitus: Secondary | ICD-10-CM | POA: Diagnosis not present

## 2020-01-04 DIAGNOSIS — E119 Type 2 diabetes mellitus without complications: Secondary | ICD-10-CM

## 2020-01-04 DIAGNOSIS — Z7984 Long term (current) use of oral hypoglycemic drugs: Secondary | ICD-10-CM

## 2020-01-04 DIAGNOSIS — Z8261 Family history of arthritis: Secondary | ICD-10-CM

## 2020-01-04 LAB — CBC
HCT: 44.9 % (ref 39.0–52.0)
Hemoglobin: 14.6 g/dL (ref 13.0–17.0)
MCH: 29.8 pg (ref 26.0–34.0)
MCHC: 32.5 g/dL (ref 30.0–36.0)
MCV: 91.6 fL (ref 80.0–100.0)
Platelets: 95 10*3/uL — ABNORMAL LOW (ref 150–400)
RBC: 4.9 MIL/uL (ref 4.22–5.81)
RDW: 13 % (ref 11.5–15.5)
WBC: 3.5 10*3/uL — ABNORMAL LOW (ref 4.0–10.5)
nRBC: 0 % (ref 0.0–0.2)

## 2020-01-04 LAB — URINALYSIS, ROUTINE W REFLEX MICROSCOPIC
Bilirubin Urine: NEGATIVE
Glucose, UA: NEGATIVE mg/dL
Hgb urine dipstick: NEGATIVE
Ketones, ur: 5 mg/dL — AB
Leukocytes,Ua: NEGATIVE
Nitrite: NEGATIVE
Protein, ur: NEGATIVE mg/dL
Specific Gravity, Urine: 1.011 (ref 1.005–1.030)
pH: 7 (ref 5.0–8.0)

## 2020-01-04 LAB — COMPREHENSIVE METABOLIC PANEL
ALT: 10 U/L (ref 0–44)
AST: 15 U/L (ref 15–41)
Albumin: 3.9 g/dL (ref 3.5–5.0)
Alkaline Phosphatase: 62 U/L (ref 38–126)
Anion gap: 11 (ref 5–15)
BUN: 19 mg/dL (ref 6–20)
CO2: 24 mmol/L (ref 22–32)
Calcium: 9 mg/dL (ref 8.9–10.3)
Chloride: 104 mmol/L (ref 98–111)
Creatinine, Ser: 1.27 mg/dL — ABNORMAL HIGH (ref 0.61–1.24)
GFR, Estimated: >60 mL/min (ref >60)
Glucose, Bld: 169 mg/dL — ABNORMAL HIGH (ref 70–99)
Potassium: 3.8 mmol/L (ref 3.5–5.1)
Sodium: 139 mmol/L (ref 135–145)
Total Bilirubin: 0.9 mg/dL (ref 0.3–1.2)
Total Protein: 6.9 g/dL (ref 6.5–8.1)

## 2020-01-04 LAB — RESP PANEL BY RT-PCR (FLU A&B, COVID) ARPGX2
Influenza A by PCR: NEGATIVE
Influenza B by PCR: NEGATIVE
SARS Coronavirus 2 by RT PCR: NEGATIVE

## 2020-01-04 LAB — TROPONIN I (HIGH SENSITIVITY)
Troponin I (High Sensitivity): 45 ng/L — ABNORMAL HIGH (ref <18)
Troponin I (High Sensitivity): 55 ng/L — ABNORMAL HIGH (ref <18)

## 2020-01-04 LAB — CBG MONITORING, ED: Glucose-Capillary: 189 mg/dL — ABNORMAL HIGH (ref 70–99)

## 2020-01-04 LAB — BRAIN NATRIURETIC PEPTIDE: B Natriuretic Peptide: 465.9 pg/mL — ABNORMAL HIGH (ref 0.0–100.0)

## 2020-01-04 IMAGING — CT CT HEAD W/O CM
3 of 4 series · 14 of 47 positions shown, 16 images · non-contrast
Comparison: None.

CLINICAL DATA: Altered mental status and dizziness since this
morning.

EXAM:
CT HEAD WITHOUT CONTRAST
TECHNIQUE: Contiguous axial images were obtained from the base of the skull
through the vertex without intravenous contrast.

[Series 2: head wo · axial · 0.47mm/px · z∈[+442,+576]mm · 8 of 33 slices shown, 10 images]
[im 3/33  brain]
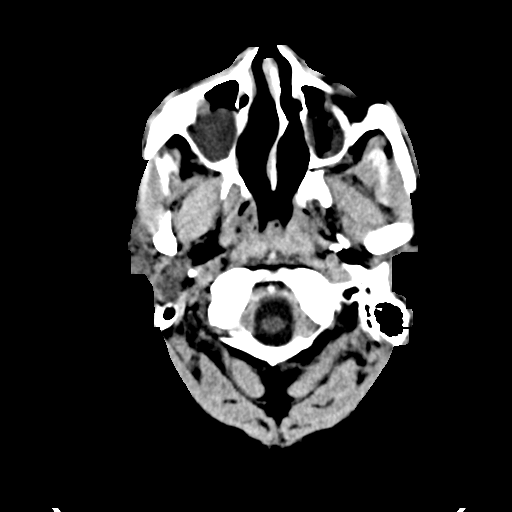
[im 3/33  bone]
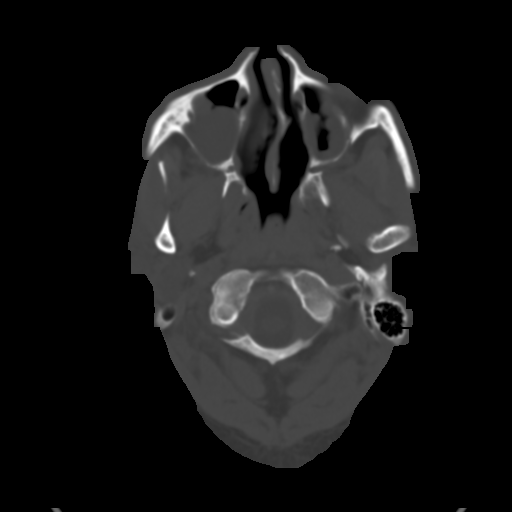
[im 7/33  brain]
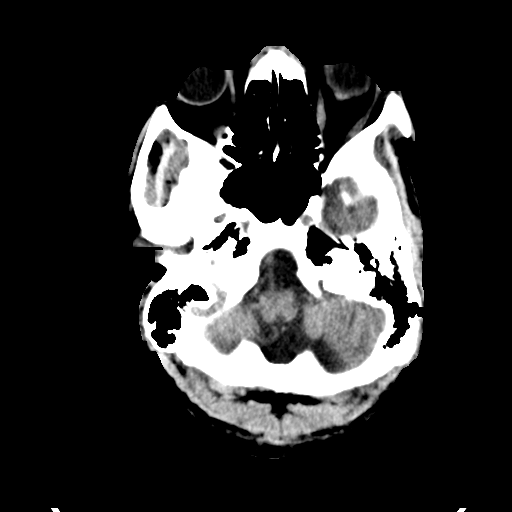
[im 12/33  brain]
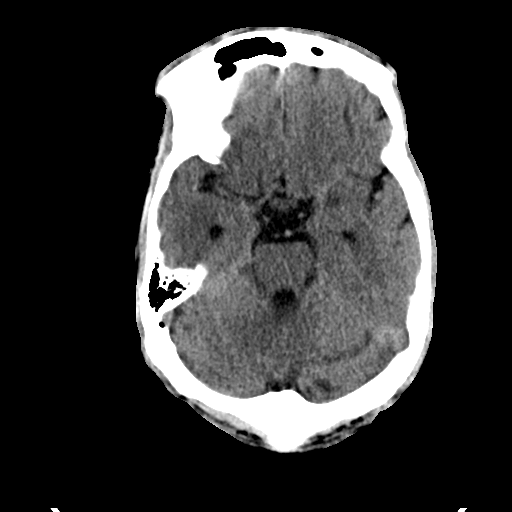
[im 14/33  brain]
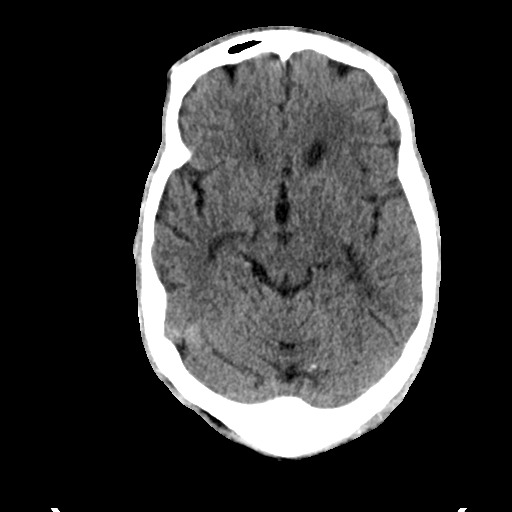
[im 19/33  brain]
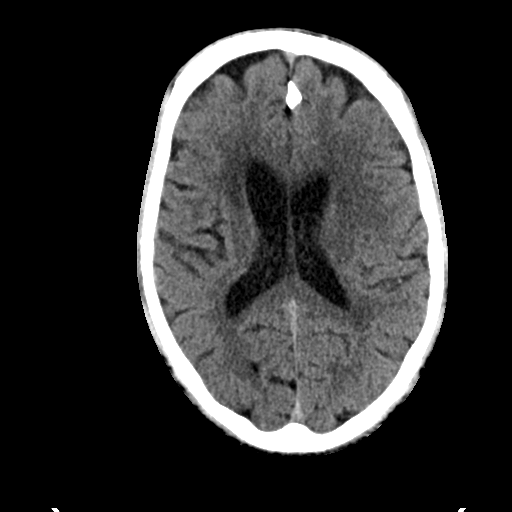
[im 19/33  bone]
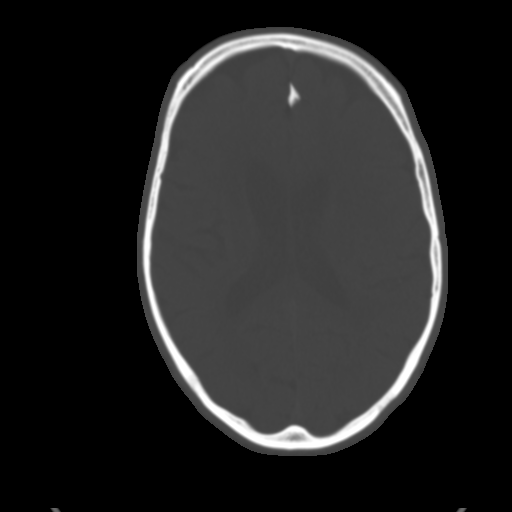
[im 21/33  brain]
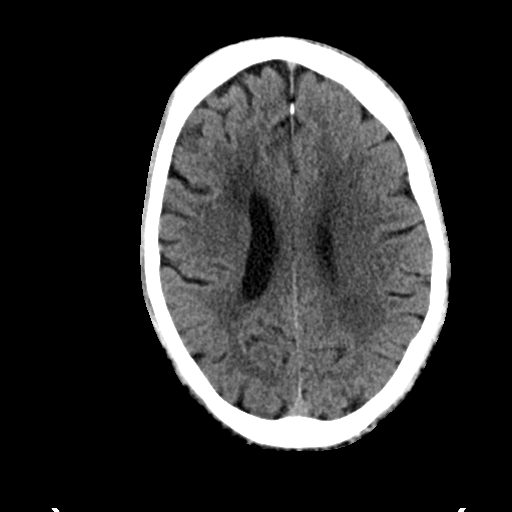
[im 26/33  brain]
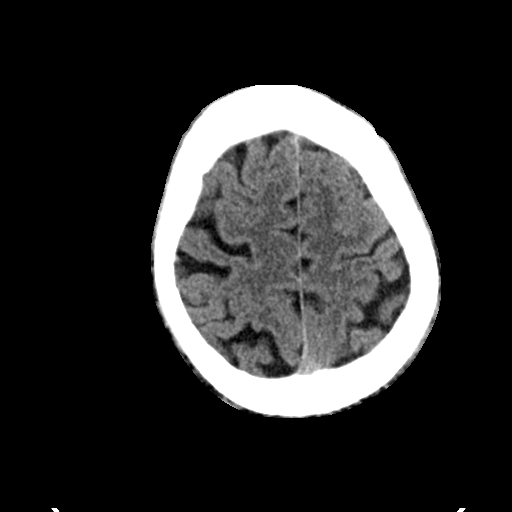
[im 30/33  brain]
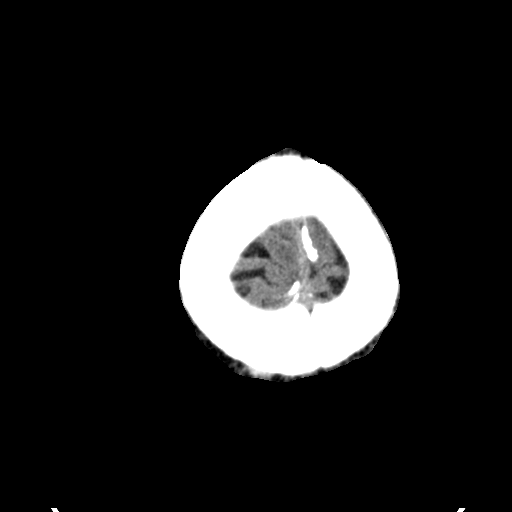

[Series 4: coronal soft tissue · coronal · 0.33mm/px · 3 of 75 slices shown]
[im 25/75  brain]
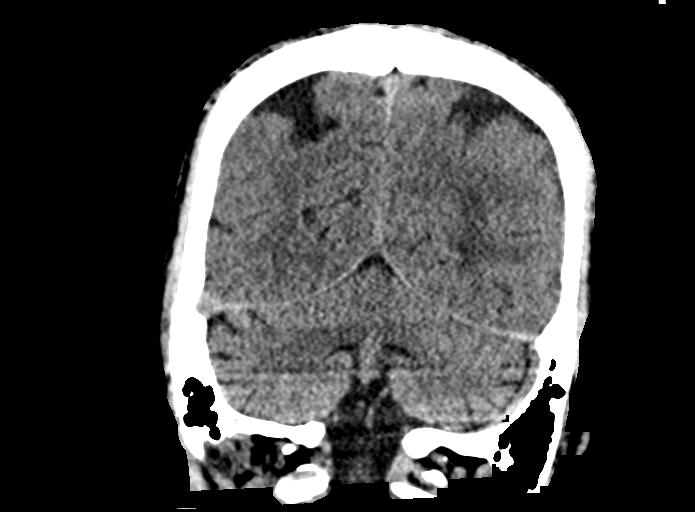
[im 33/75  brain]
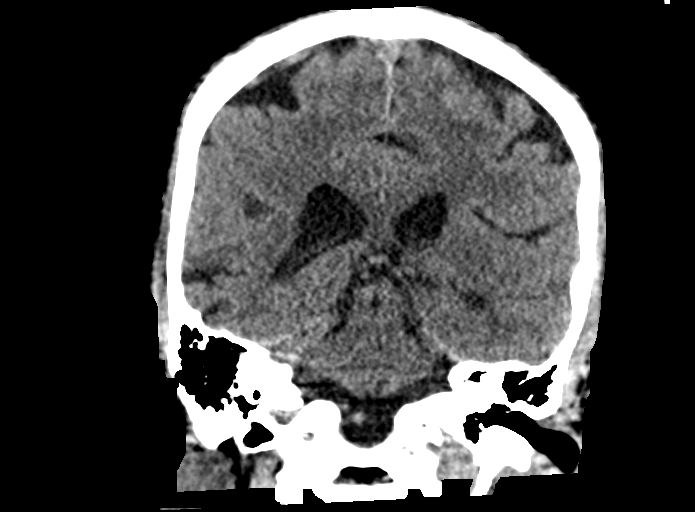
[im 42/75  brain]
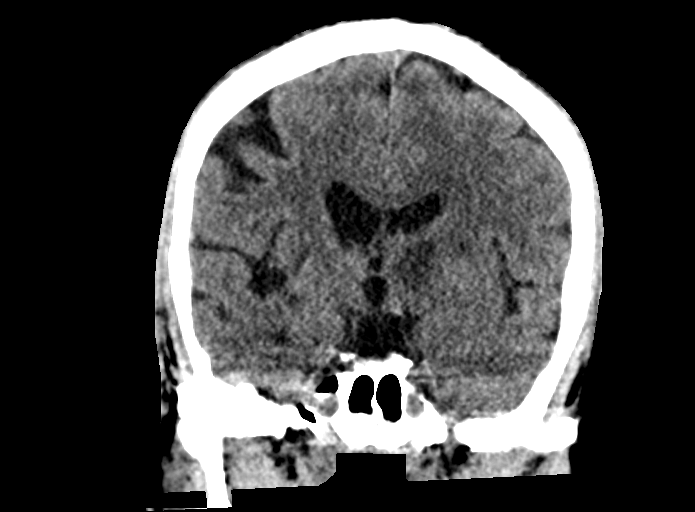

[Series 5: sagittal soft tissue · sagittal · 0.37mm/px · 3 of 58 slices shown]
[im 20/58  brain]
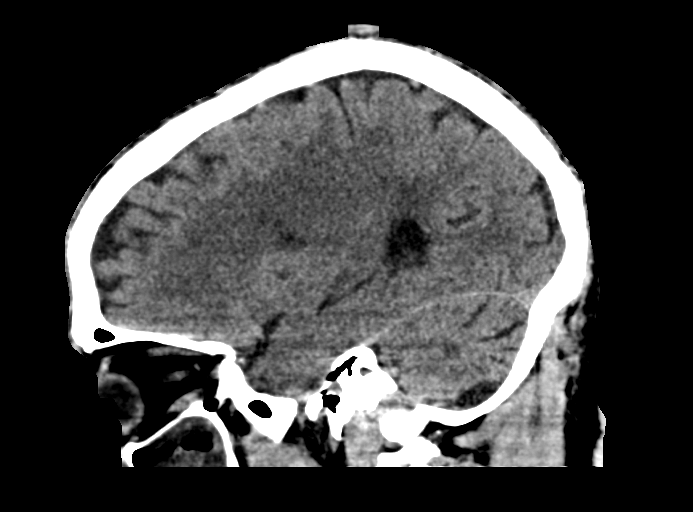
[im 29/58  brain]
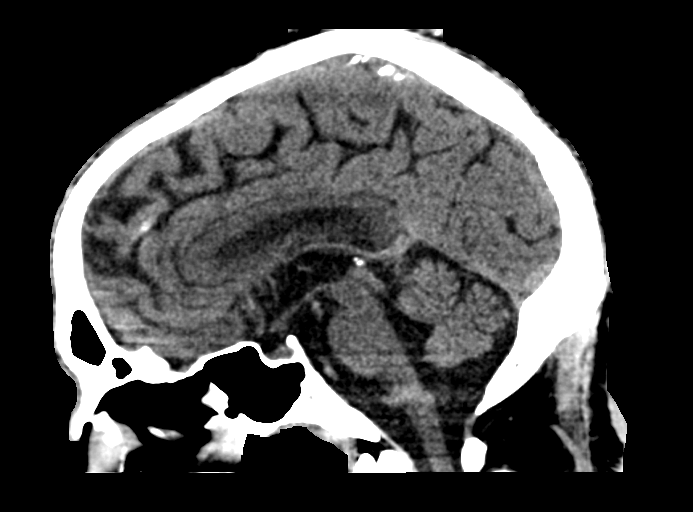
[im 39/58  brain]
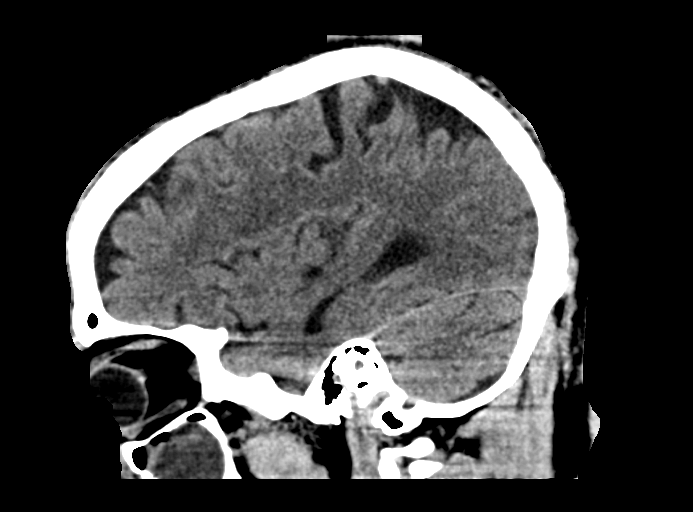

[14 of 47 positions shown; findings below may reference images not displayed]

FINDINGS: Brain: Ventricles, cisterns and other CSF spaces are within normal.
There is chronic ischemic microvascular disease. Old right caudate
lacunar infarct. Patchy low-attenuation over the bilateral basal
ganglia most prominent over the region of the genu of the left
internal capsule which may be due to subacute to chronic ischemic
change. No mass, mass effect or shift of midline structures. No
acute hemorrhage.

Vascular: No hyperdense vessel or unexpected calcification.

Skull: Normal. Negative for fracture or focal lesion.

Sinuses/Orbits: Orbits are normal symmetric. Near complete
opacification of the maxillary sinuses bilaterally.

Other: None.
IMPRESSION: 1. Patchy low-attenuation over the bilateral basal ganglia most
prominent over the region of the genu of the left internal capsule
which may be due to subacute to chronic ischemic change.
2. Chronic ischemic microvascular disease. Old right caudate lacunar
infarct.
3. Chronic sinus inflammatory disease.

## 2020-01-04 IMAGING — MR MR HEAD W/O CM
10 series · 44 of 48 positions shown · non-contrast
Comparison: Head CT [DATE]

CLINICAL DATA: TIA.  Acute change in mental status.

EXAM:
MRI HEAD WITHOUT CONTRAST
TECHNIQUE: Multiplanar, multiecho pulse sequences of the brain and surrounding
structures were obtained without intravenous contrast.

[Series 5: dwi_tracew · axial · 3.0mm · 1.08mm/px · z∈[-46,+118]mm · 8 of 112 slices shown]
[im 1/112]
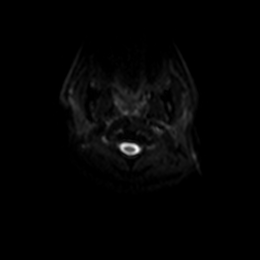
[im 23/112]
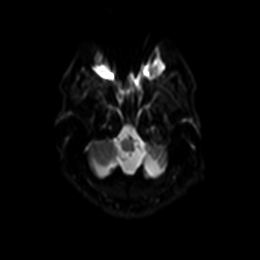
[im 34/112]
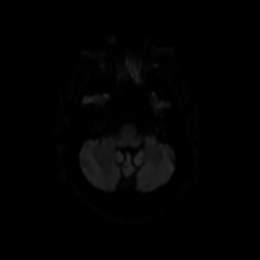
[im 45/112]
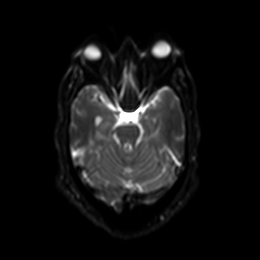
[im 67/112]
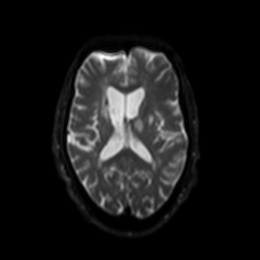
[im 78/112]
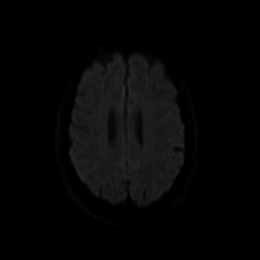
[im 89/112]
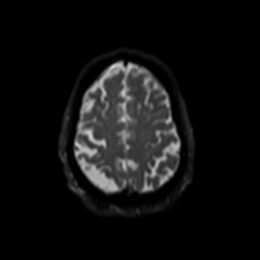
[im 112/112]
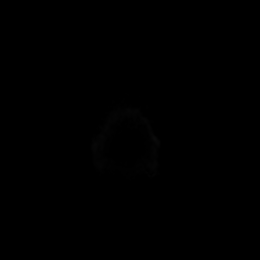

[Series 6: dwi_adc · axial · 3.0mm · 1.08mm/px · z∈[-46,+85]mm · 5 of 56 slices shown]
[im 1/56]
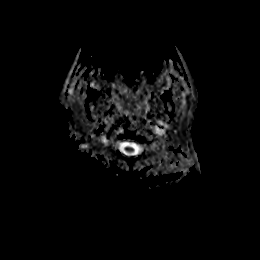
[im 12/56]
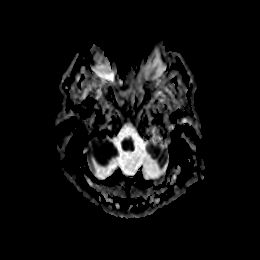
[im 23/56]
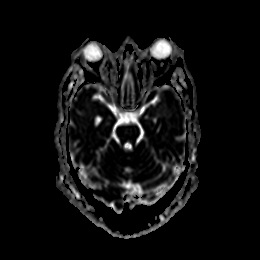
[im 34/56]
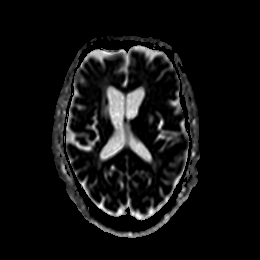
[im 45/56]
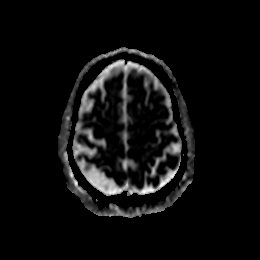

[Series 7: T2 · sagittal · 5.0mm · 0.55mm/px · 2 of 25 slices shown (1 of 3)]
[im 1/25]
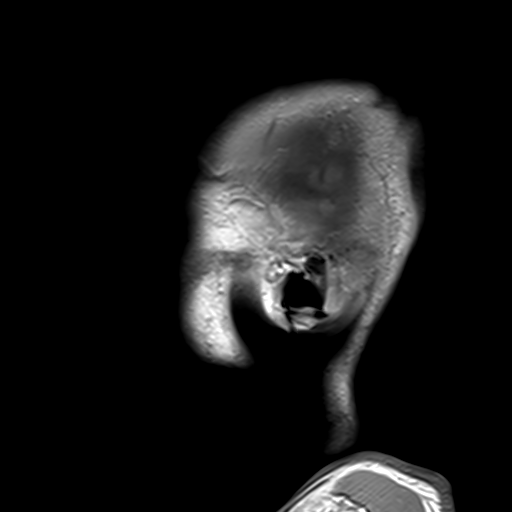
[im 25/25]
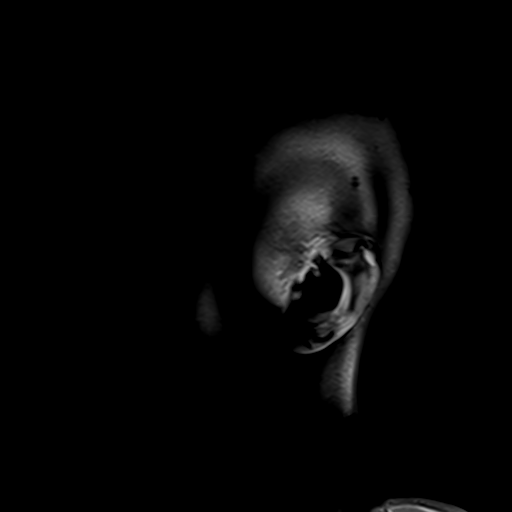

[Series 8: T2 · axial · 5.0mm · 0.45mm/px · z∈[-39,+130]mm · 2 of 27 slices shown (2 of 3)]
[im 1/27]
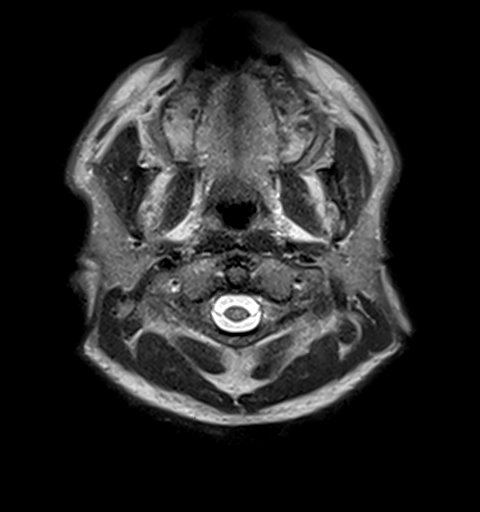
[im 27/27]
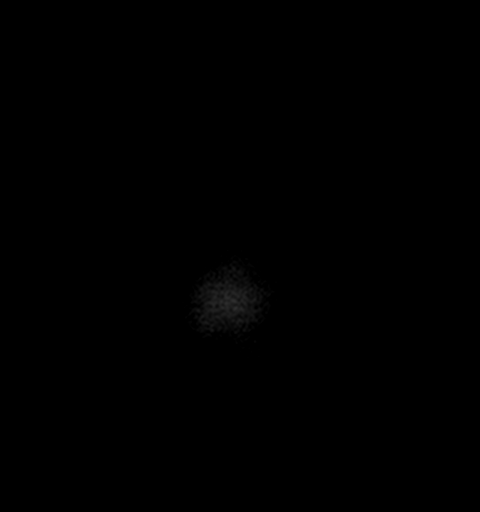

[Series 9: GRE · axial · 3.0mm · 0.45mm/px · z∈[-51,+126]mm · 5 of 60 slices shown]
[im 1/60]
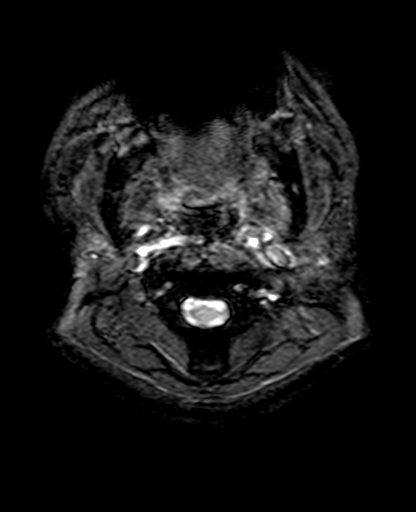
[im 15/60]
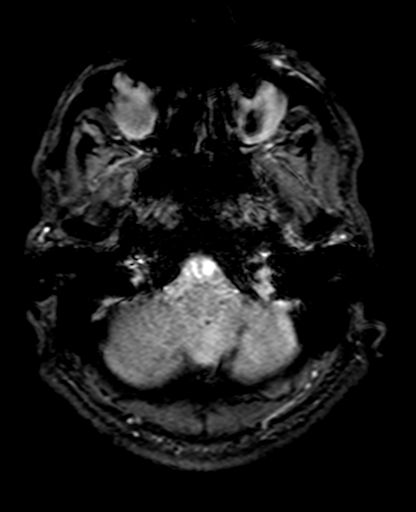
[im 30/60]
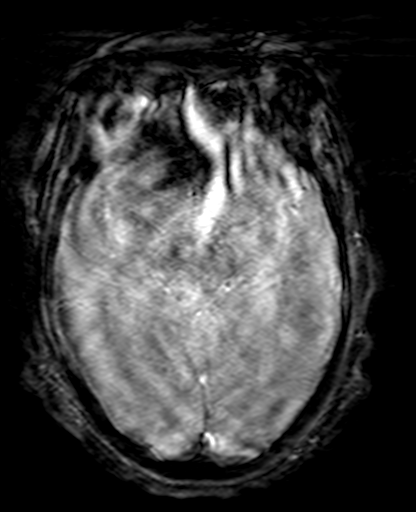
[im 45/60]
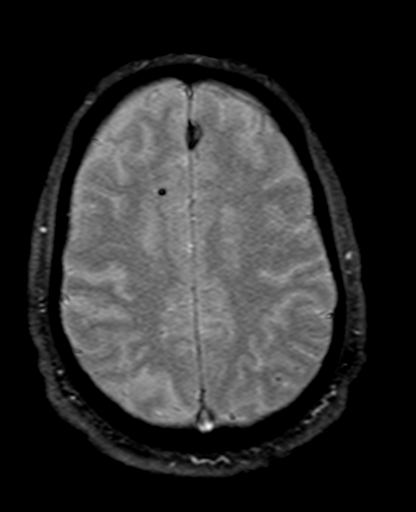
[im 60/60]
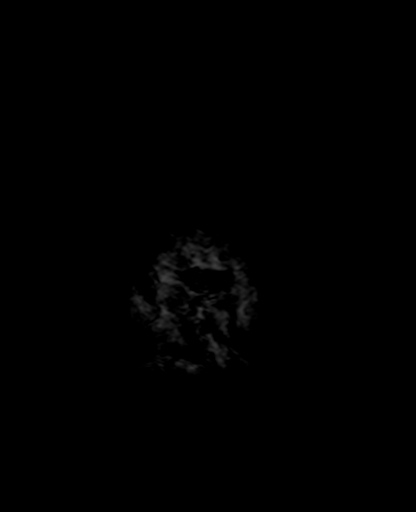

[Series 10: FLAIR · axial · 3.0mm · 0.86mm/px · z∈[-48,+123]mm · 5 of 58 slices shown]
[im 1/58]
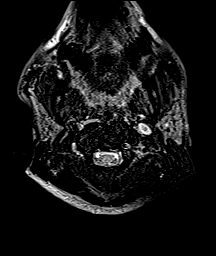
[im 15/58]
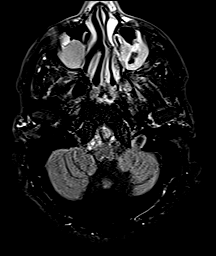
[im 29/58]
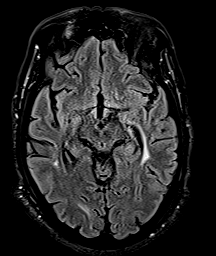
[im 43/58]
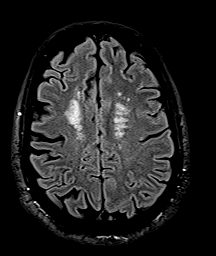
[im 58/58]
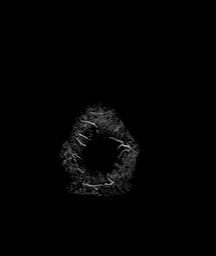

[Series 11: T1 · axial · 3.0mm · 0.45mm/px · z∈[-47,+121]mm · 5 of 57 slices shown]
[im 1/57]
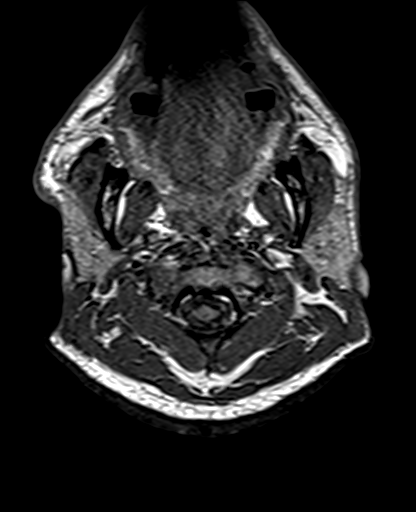
[im 15/57]
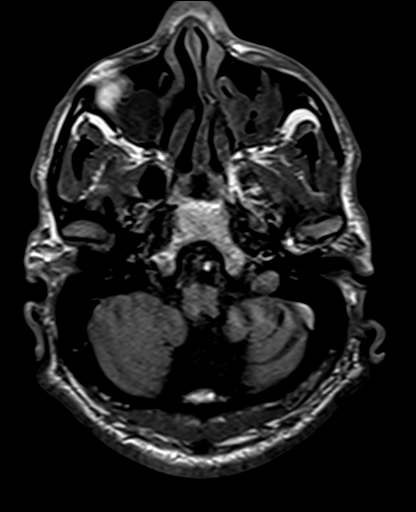
[im 29/57]
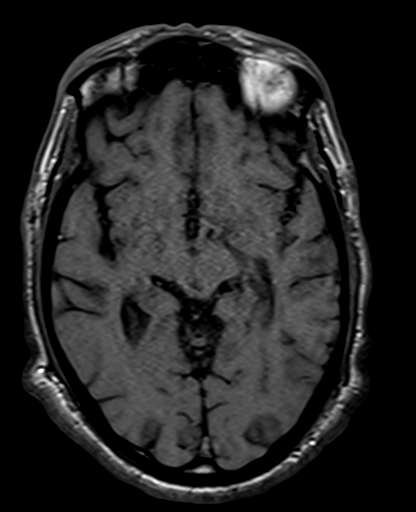
[im 43/57]
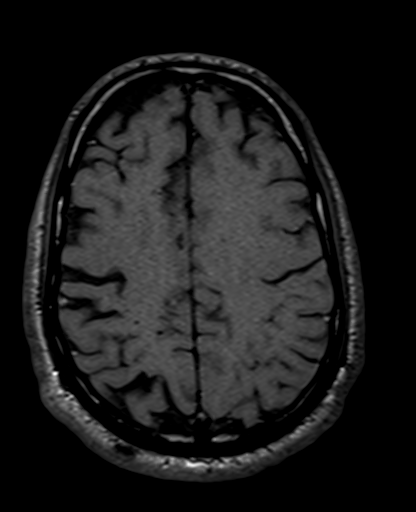
[im 57/57]
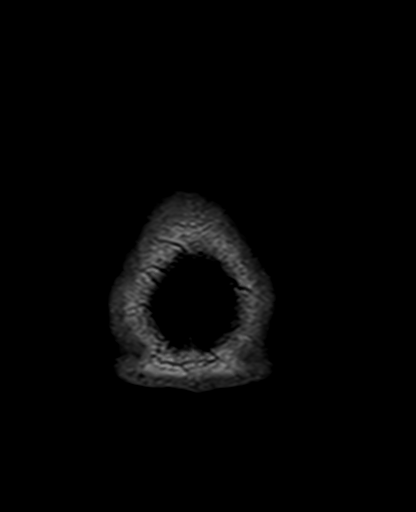

[Series 12: DWI · coronal · 5.0mm · 1.31mm/px · 6 of 68 slices shown (1 of 2)]
[im 1/68]
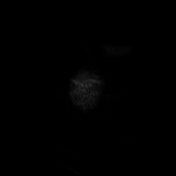
[im 14/68]
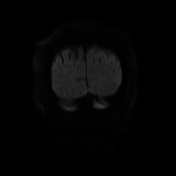
[im 27/68]
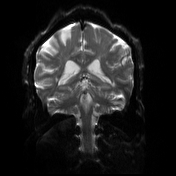
[im 41/68]
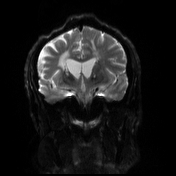
[im 54/68]
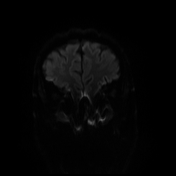
[im 68/68]
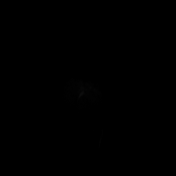

[Series 13: DWI · coronal · 5.0mm · 1.31mm/px · 3 of 34 slices shown (2 of 2)]
[im 1/34]
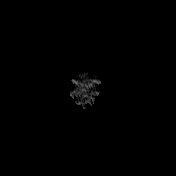
[im 17/34]
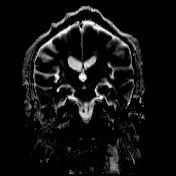
[im 34/34]
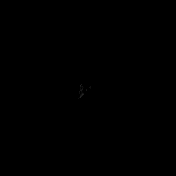

[Series 14: T2 · coronal · 5.0mm · 0.86mm/px · 3 of 34 slices shown (3 of 3)]
[im 1/34]
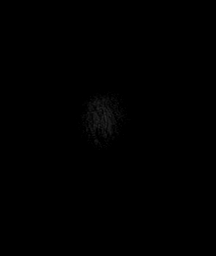
[im 17/34]
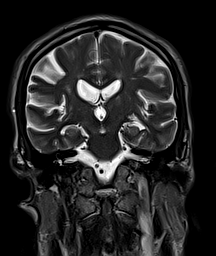
[im 34/34]
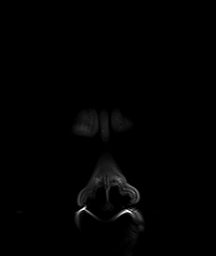

[44 of 48 positions shown; findings below may reference images not displayed]

FINDINGS: Brain: There is a 1.5 cm acute infarct involving the genu and
posterior limb of the left internal capsule. Patchy T2
hyperintensities in the cerebral white matter bilaterally are
nonspecific but compatible with moderate chronic small vessel
ischemic disease. There are chronic lacunar infarcts in the basal
ganglia bilaterally. There is also a small chronic left. Cerebellar
infarct there is mild asymmetric ex vacuo dilatation of the right
lateral ventricle. A chronic microhemorrhage is noted in the right
frontal lobe.

Vascular: Major intracranial vascular flow voids are preserved.

Skull and upper cervical spine: No suspicious marrow lesion.

Sinuses/Orbits: Unremarkable orbits. Extensive mucosal thickening in
the maxillary sinuses. Clear mastoid air cells.

Other: None.
IMPRESSION: 1. Acute left internal capsule infarct.
2. Moderate chronic small vessel ischemic disease with chronic
lacunar infarcts as above.

## 2020-01-04 IMAGING — DX DG CHEST 1V PORT
1 series · 1 of 1 positions shown · non-contrast
Comparison: [DATE]

CLINICAL DATA: Altered mental status.

EXAM:
PORTABLE CHEST 1 VIEW

[chest ap]
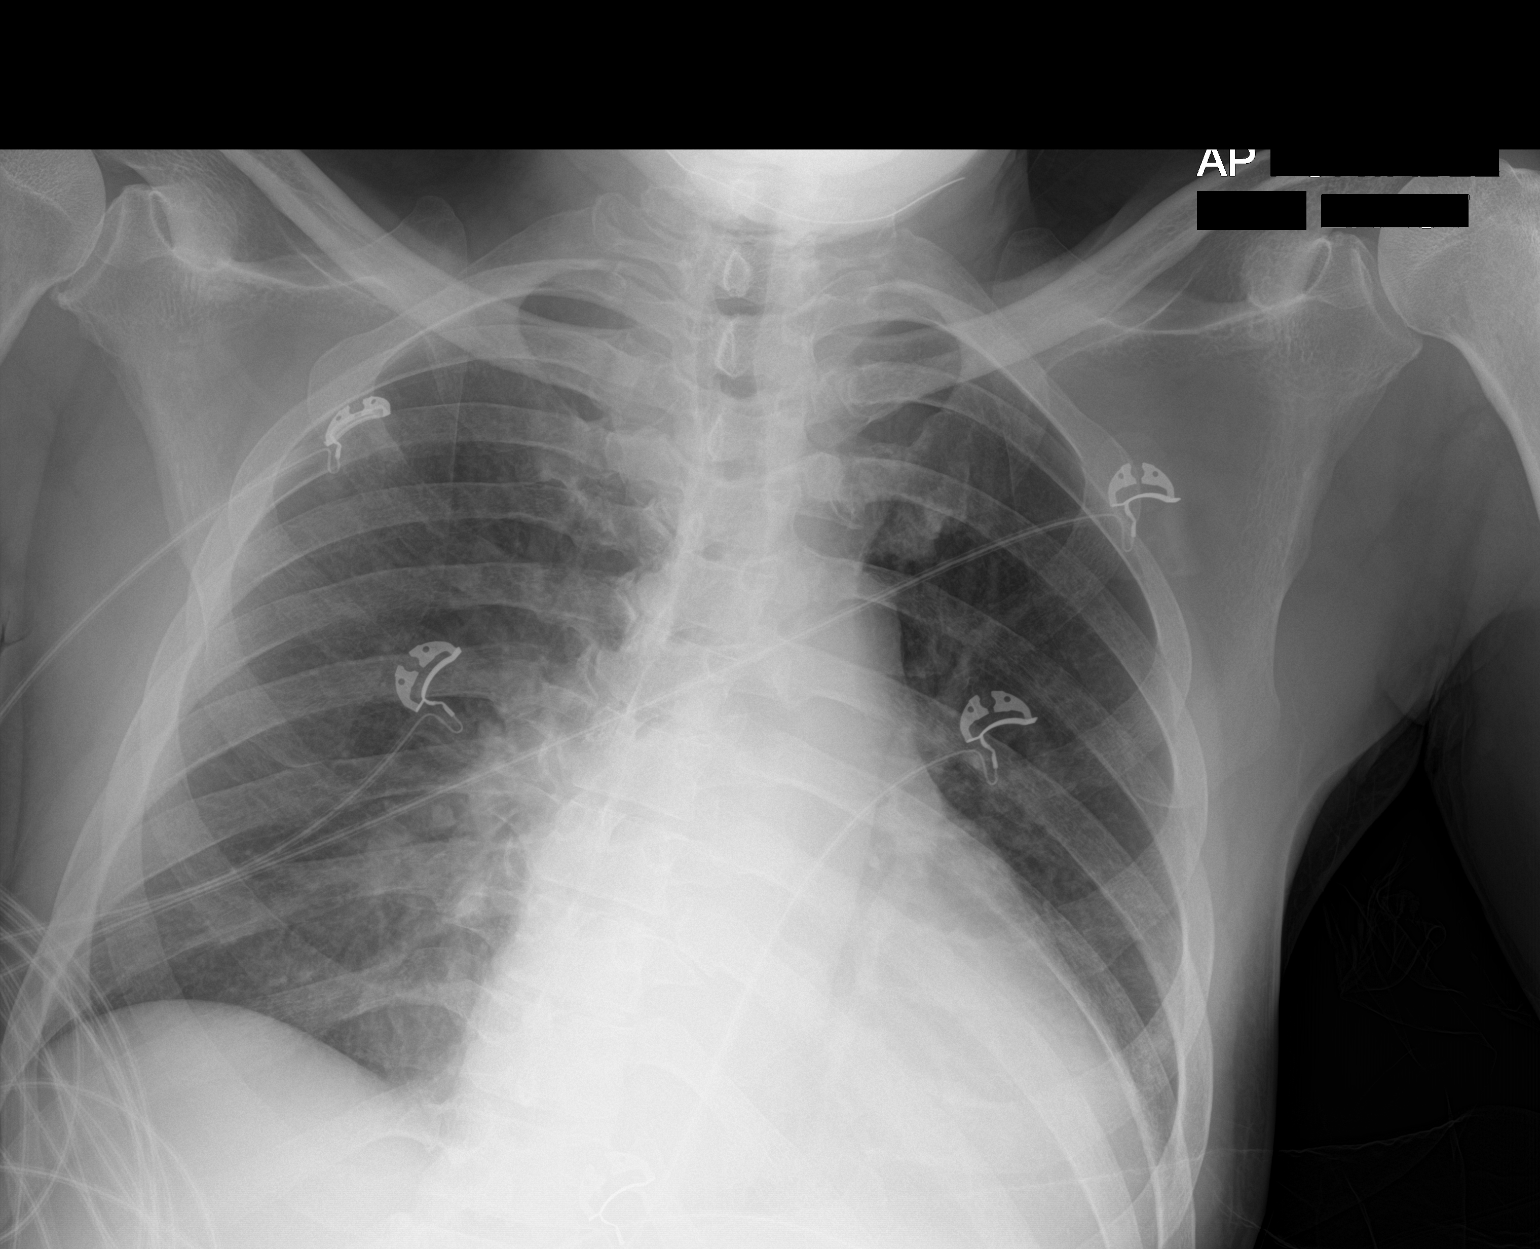

[1 of 1 positions shown; findings below may reference images not displayed]

FINDINGS: Midline trachea. Mild cardiomegaly, accentuated by AP portable
technique. Suspect a small left pleural effusion. No pneumothorax.
No congestive failure. Left lung base not well evaluated secondary
to AP portable technique and low lung volumes. Suspicion of airspace
disease.
IMPRESSION: Possible small left pleural effusion and adjacent airspace disease.
This could represent atelectasis or infection. Consider PA and
lateral radiographs if possible.

## 2020-01-04 MED ORDER — HYDRALAZINE HCL 20 MG/ML IJ SOLN
5.0000 mg | Freq: Once | INTRAMUSCULAR | Status: DC
  Filled 2020-01-04: qty 1

## 2020-01-04 MED ORDER — HYDRALAZINE HCL 20 MG/ML IJ SOLN
10.0000 mg | Freq: Once | INTRAMUSCULAR | Status: AC
  Administered 2020-01-04: 17:00:00 10 mg via INTRAVENOUS

## 2020-01-04 MED ORDER — HYDRALAZINE HCL 20 MG/ML IJ SOLN
10.0000 mg | INTRAMUSCULAR | Status: DC | PRN
  Administered 2020-01-04: 22:00:00 10 mg via INTRAVENOUS
  Filled 2020-01-04: qty 0.5
  Filled 2020-01-04: qty 1

## 2020-01-04 MED ORDER — NICARDIPINE HCL IN NACL 20-0.86 MG/200ML-% IV SOLN
3.0000 mg/h | INTRAVENOUS | Status: DC
  Administered 2020-01-04: 5 mg/h via INTRAVENOUS
  Administered 2020-01-05 (×2): 12.5 mg/h via INTRAVENOUS
  Administered 2020-01-05: 05:00:00 10 mg/h via INTRAVENOUS
  Filled 2020-01-04 (×4): qty 200

## 2020-01-04 MED ORDER — ASPIRIN 325 MG PO TABS
325.0000 mg | ORAL_TABLET | Freq: Once | ORAL | Status: AC
  Administered 2020-01-04: 16:00:00 325 mg via ORAL
  Filled 2020-01-04: qty 1

## 2020-01-04 NOTE — ED Notes (Signed)
Placed male condom external catheter on patient

## 2020-01-04 NOTE — ED Notes (Signed)
Patient notified of need for urine sample.  

## 2020-01-04 NOTE — H&P (Signed)
History and Physical    Kevin [name] [place] SUP:[id number] DOB: [birth year] DOA: 01/04/2020  PCP: Medicine, [facility]  Patient coming from: Home  Chief Complaint: AMS  HPI: Kevin [name] is a 54 y.o. male with medical history significant of HTN, HFrEF, DM2, HLD. Presenting with AMS. He is unable to provide history. There is no family at bedside and I am unable to reach them at 3 numbers he has at his bedside. Limited history is from chart review. He told ED staff that he was he just because he is not feeling well. He tells me that he is here for surgery. He is unable to tell me much more.    ED Course: He was noted to be hypertensive. An MRI was obtained that showed an acute CVA. Neurology was consulted. Recommended transfer to [facility] for completion of stroke w/u. [facility] was called for admission.   Review of Systems:  Unable to obtain d/t mentation.  PMHx Past Medical History:  Diagnosis Date  . Blind left eye   . Diabetes mellitus without complication (HCC)   . Hypertension     PSHx Past Surgical History:  Procedure Laterality Date  . GYNECOMASTIA EXCISION      SocHx  reports that he has been smoking. He has a 80.00 pack-year smoking history. He has never used smokeless tobacco. He reports current alcohol use. He reports current drug use. Drug: Marijuana.  No Known Allergies  FamHx Family History  Problem Relation Age of Onset  . Diabetes Mother   . Hypertension Mother   . Hypertension Father   . Diabetes Maternal Grandmother   . Hypertension Maternal Grandmother   . Arthritis Maternal Grandmother   . Congestive Heart Failure Neg Hx     Prior to Admission medications   Medication Sig Start Date End Date Taking? Authorizing Provider  lisinopril (ZESTRIL) 20 MG tablet Take 20 mg by mouth daily. 11/08/19   [provider]  metFORMIN (GLUCOPHAGE) 1000 MG tablet Take 1,000 mg by mouth in the morning and at bedtime.  02/02/17   [provider]     Physical Exam: Vitals:   01/04/20 1508 01/04/20 1542 01/04/20 1549 01/04/20 1638  BP: (!) 208/115 (!) 206/156 (!) 211/134 (!) 209/124  Pulse: 66 69 62 64  Resp: 18 13 18 20   Temp:  98 F (36.7 C) 98.3 F (36.8 C)   TempSrc:  Oral Oral   SpO2: 99% 98% 100% 99%  Weight:      Height:        General: 54 y.o. male resting in bed in NAD Eyes: PERRL, normal sclera ENMT: Nares patent w/o discharge, orophaynx clear, dentition normal, ears w/o discharge/lesions/ulcers Neck: Supple, trachea midline Cardiovascular: RRR, +S1, S2, no m/g/r, equal pulses throughout Respiratory: CTABL, no w/r/r, normal WOB GI: BS+, NDNT, no masses noted, no organomegaly noted MSK: No e/c/c Skin: No rashes, bruises, ulcerations noted Neuro: A&O x name, place, LLE weakness although he is confused and having difficulty following commands Psyc: Confused, flat affect, calm and attempts to cooperate  Labs on Admission: I have personally reviewed following labs and imaging studies  CBC: Recent Labs  Lab 01/04/20 1313  WBC 3.5*  HGB 14.6  HCT 44.9  MCV 91.6  PLT 95*   Basic Metabolic Panel: Recent Labs  Lab 01/04/20 1313  NA 139  K 3.8  CL 104  CO2 24  GLUCOSE 169*  BUN 19  CREATININE 1.27*  CALCIUM 9.0   GFR: Estimated Creatinine  Clearance: 76.4 mL/min (A) (by C-G formula based on SCr of 1.27 mg/dL (H)). Liver Function Tests: Recent Labs  Lab 01/04/20 1313  AST 15  ALT 10  ALKPHOS 62  BILITOT 0.9  PROT 6.9  ALBUMIN 3.9   No results for input(s): LIPASE, AMYLASE in the last 168 hours. No results for input(s): AMMONIA in the last 168 hours. Coagulation Profile: No results for input(s): INR, PROTIME in the last 168 hours. Cardiac Enzymes: No results for input(s): CKTOTAL, CKMB, CKMBINDEX, TROPONINI in the last 168 hours. BNP (last 3 results) No results for input(s): PROBNP in the last 8760 hours. HbA1C: No results for input(s): HGBA1C in the last 72 hours. CBG: Recent Labs   Lab 01/04/20 1236  GLUCAP 189*   Lipid Profile: No results for input(s): CHOL, HDL, LDLCALC, TRIG, CHOLHDL, LDLDIRECT in the last 72 hours. Thyroid Function Tests: No results for input(s): TSH, T4TOTAL, FREET4, T3FREE, THYROIDAB in the last 72 hours. Anemia Panel: No results for input(s): VITAMINB12, FOLATE, FERRITIN, TIBC, IRON, RETICCTPCT in the last 72 hours. Urine analysis: No results found for: COLORURINE, APPEARANCEUR, LABSPEC, PHURINE, GLUCOSEU, HGBUR, BILIRUBINUR, KETONESUR, PROTEINUR, UROBILINOGEN, NITRITE, LEUKOCYTESUR  Radiological Exams on Admission: CT Head Wo Contrast  Result Date: 01/04/2020 CLINICAL DATA:  Altered mental status and dizziness since this morning. EXAM: CT HEAD WITHOUT CONTRAST TECHNIQUE: Contiguous axial images were obtained from the base of the skull through the vertex without intravenous contrast. COMPARISON:  None. FINDINGS: Brain: Ventricles, cisterns and other CSF spaces are within normal. There is chronic ischemic microvascular disease. Old right caudate lacunar infarct. Patchy low-attenuation over the bilateral basal ganglia most prominent over the region of the genu of the left internal capsule which may be due to subacute to chronic ischemic change. No mass, mass effect or shift of midline structures. No acute hemorrhage. Vascular: No hyperdense vessel or unexpected calcification. Skull: Normal. Negative for fracture or focal lesion. Sinuses/Orbits: Orbits are normal symmetric. Near complete opacification of the maxillary sinuses bilaterally. Other: None. IMPRESSION: 1. Patchy low-attenuation over the bilateral basal ganglia most prominent over the region of the genu of the left internal capsule which may be due to subacute to chronic ischemic change. 2. Chronic ischemic microvascular disease. Old right caudate lacunar infarct. 3. Chronic sinus inflammatory disease. Electronically Signed   By: Elberta Fortis M.D.   On: 01/04/2020 14:05   MR BRAIN WO  CONTRAST  Result Date: 01/04/2020 CLINICAL DATA:  TIA.  Acute change in mental status. EXAM: MRI HEAD WITHOUT CONTRAST TECHNIQUE: Multiplanar, multiecho pulse sequences of the brain and surrounding structures were obtained without intravenous contrast. COMPARISON:  Head CT 01/04/2020 FINDINGS: Brain: There is a 1.5 cm acute infarct involving the genu and posterior limb of the left internal capsule. Patchy T2 hyperintensities in the cerebral white matter bilaterally are nonspecific but compatible with moderate chronic small vessel ischemic disease. There are chronic lacunar infarcts in the basal ganglia bilaterally. There is also a small chronic left. Cerebellar infarct there is mild asymmetric ex vacuo dilatation of the right lateral ventricle. A chronic microhemorrhage is noted in the right frontal lobe. Vascular: Major intracranial vascular flow voids are preserved. Skull and upper cervical spine: No suspicious marrow lesion. Sinuses/Orbits: Unremarkable orbits. Extensive mucosal thickening in the maxillary sinuses. Clear mastoid air cells. Other: None. IMPRESSION: 1. Acute left internal capsule infarct. 2. Moderate chronic small vessel ischemic disease with chronic lacunar infarcts as above. Electronically Signed   By: Sebastian Ache M.D.   On: 01/04/2020 15:15  DG Chest Port 1 View  Result Date: 01/04/2020 CLINICAL DATA:  Altered mental status. EXAM: PORTABLE CHEST 1 VIEW COMPARISON:  11/29/2019 FINDINGS: Midline trachea. Mild cardiomegaly, accentuated by AP portable technique. Suspect a small left pleural effusion. No pneumothorax. No congestive failure. Left lung base not well evaluated secondary to AP portable technique and low lung volumes. Suspicion of airspace disease. IMPRESSION: Possible small left pleural effusion and adjacent airspace disease. This could represent atelectasis or infection. Consider PA and lateral radiographs if possible. Electronically Signed   By: Jeronimo Greaves M.D.   On:  01/04/2020 14:21    EKG: Independently reviewed. Sinus, w/ repol changes consistent from 11/18 EKG  Assessment/Plan CVA AMS     - admit to [facility], inpt, telemetry     - MRI brain w/ stroke     - Neuro onboard     - given cardiac history, BP parameters are to allow for SBP up to 170; discussed w/ neuro     - echo, ASA/plavix, CTA H&N     - PT/OT/SLP/TOC     - check UDS  HTN     - permissive HTN up to SBP 170  Chronic HFrEF      - last echo with EF <20%      - BNP is better than last month (1276 --> 465); do not believe he is in HF exacerbation   Elevated troponin     - EKG essentially stable from last month and troponin is down from last month (505 -> 45)     - no complaints of CP     - cycle another troponin and place on tele     - echo is ordered  HLD     - statin  DM2     - SSI, A1c, glucose checks, can have DM diet when cleared for diet  DVT prophylaxis: SCDs  Code Status: FULL  Family Communication: Attempt calls to [facility] [phone]), Emilie Rutter [phone]), and [place] [phone]). Only received VM at each number. Consults called: Neurology   Status is: Inpatient  Remains inpatient appropriate because:Inpatient level of care appropriate due to severity of illness   Dispo: The patient is from: Home              Anticipated d/c is to: SNF              Anticipated d/c date is: 3 days              Patient currently is not medically stable to d/c.  Teddy Spike DO [facility]  If 7PM-7AM, please contact night-coverage [email or web address]  01/04/2020, 4:43 PM

## 2020-01-04 NOTE — ED Notes (Addendum)
Patients blood pressure elevated at 218/111. Cliffton Asters, APP notified. Awaiting orders for medication.

## 2020-01-04 NOTE — ED Notes (Signed)
Patient off floor for MRI

## 2020-01-04 NOTE — ED Notes (Signed)
Manuela Schwartz, NP notified of patients BP 200/115 after 10mg  Hydralazine given.

## 2020-01-04 NOTE — ED Notes (Signed)
Repositioned Blood pressure cuff from left arm to right arm

## 2020-01-04 NOTE — ED Triage Notes (Signed)
Patient reports he "feels tension" patient cannot secribe what he is feeling and is not responding appropriately to open ended questions. Patient has concerns for stroke/TIA as he reports a hx of this. Patient denies N/V/D, chest pain or SOB. Physical neuro exam unremarkable.

## 2020-01-04 NOTE — ED Notes (Signed)
MD YAO NOTIFIED OF HIGH BLOOD PRESSURE.

## 2020-01-04 NOTE — ED Notes (Signed)
Fall bundle: Yellow socks Yellow fall risk armband Fall risk sign outside door Bed alarm on 

## 2020-01-04 NOTE — ED Provider Notes (Signed)
[facility]-EMERGENCY DEPT Provider Note   CSN: [id number] Arrival date & time: 01/04/20  1219     History Chief Complaint  Patient presents with   Altered Mental Status    Kevin [name] is a 54 y.o. male hx of DM, HTN, here with AMS. Patient is noted to be very altered today is unclear what has happened. Patient states that he is just not feeling well.  Patient unable to give much history.  Upon review of records, patient was recently admitted for hypertensive urgency and heart failure.  Patient signed AGAINST MEDICAL ADVICE before work-up was completed.  He had a EF of 20% at that time.  Patient cannot tell me if he has motion shortness of breath or not.  He was noted to be hypertensive in the ED.  The history is provided by the patient and the EMS personnel.       Past Medical History:  Diagnosis Date   Blind left eye    Diabetes mellitus without complication (HCC)    Hypertension     Patient Active Problem List   Diagnosis Date Noted   CVA (cerebral vascular accident) (HCC) 01/04/2020   Hypertensive emergency 11/29/2019   Tobacco dependence 11/29/2019   Marijuana abuse 11/29/2019   Renal dysfunction 11/29/2019   Hypokalemia 11/29/2019   Diabetes mellitus without complication [facility])     Past Surgical History:  Procedure Laterality Date   GYNECOMASTIA EXCISION         Family History  Problem Relation Age of Onset   Diabetes Mother    Hypertension Mother    Hypertension Father    Diabetes Maternal Grandmother    Hypertension Maternal Grandmother    Arthritis Maternal Grandmother    Congestive Heart Failure Neg Hx     Social History   Tobacco Use   Smoking status: Current Every Day Smoker    Packs/day: 2.00    Years: 40.00    Pack years: 80.00   Smokeless tobacco: Never Used  Building services engineer Use: Some days  Substance Use Topics   Alcohol use: Yes    Comment: denies heavy use   Drug use: Yes     Types: Marijuana    Home Medications Prior to Admission medications   Medication Sig Start Date End Date Taking? Authorizing Provider  lisinopril (ZESTRIL) 20 MG tablet Take 20 mg by mouth daily. 11/08/19   [provider]  metFORMIN (GLUCOPHAGE) 1000 MG tablet Take 1,000 mg by mouth in the morning and at bedtime.  02/02/17   [provider]    Allergies    Patient has no known allergies.  Review of Systems   Review of Systems  Neurological: Positive for weakness.  Psychiatric/Behavioral: Positive for confusion.  All other systems reviewed and are negative.   Physical Exam Updated Vital Signs BP (!) 209/124 (BP Location: Right Arm)    Pulse 64    Temp 98.3 F (36.8 C) (Oral)    Resp 20    Ht 6\' 2"  (1.88 m)    Wt 88.5 kg    SpO2 99%    BMI 25.04 kg/m   Physical Exam Vitals and nursing note reviewed.  Constitutional:      Comments: Confused but no obvious facial droop or focal deficit  HENT:     Head: Normocephalic.     Nose: Nose normal.     Mouth/Throat:     Mouth: Mucous membranes are moist.  Eyes:  Extraocular Movements: Extraocular movements intact.     Pupils: Pupils are equal, round, and reactive to light.  Cardiovascular:     Rate and Rhythm: Normal rate and regular rhythm.     Pulses: Normal pulses.     Heart sounds: Normal heart sounds.  Pulmonary:     Comments: Diminished bilateral bases Abdominal:     General: Abdomen is flat.     Palpations: Abdomen is soft.  Musculoskeletal:        General: Normal range of motion.     Cervical back: Normal range of motion and neck supple.  Skin:    General: Skin is warm.  Neurological:     Comments: Confused but moving all extremities and no obvious facial droop     ED Results / Procedures / Treatments   Labs (all labs ordered are listed, but only abnormal results are displayed) Labs Reviewed  COMPREHENSIVE METABOLIC PANEL - Abnormal; Notable for the following components:      Result Value    Glucose, Bld 169 (*)    Creatinine, Ser 1.27 (*)    All other components within normal limits  CBC - Abnormal; Notable for the following components:   WBC 3.5 (*)    Platelets 95 (*)    All other components within normal limits  BRAIN NATRIURETIC PEPTIDE - Abnormal; Notable for the following components:   B Natriuretic Peptide 465.9 (*)    All other components within normal limits  CBG MONITORING, ED - Abnormal; Notable for the following components:   Glucose-Capillary 189 (*)    All other components within normal limits  TROPONIN I (HIGH SENSITIVITY) - Abnormal; Notable for the following components:   Troponin I (High Sensitivity) 45 (*)    All other components within normal limits  RESP PANEL BY RT-PCR (FLU A&B, COVID) [id number]  URINALYSIS, ROUTINE W REFLEX MICROSCOPIC  TROPONIN I (HIGH SENSITIVITY)    EKG EKG Interpretation  Date/Time:  Friday January 04 2020 12:52:49 EST Ventricular Rate:  51 PR Interval:    QRS Duration: 163 QT Interval:  518 QTC Calculation: 478 R Axis:   72 Text Interpretation: Sinus rhythm Atrial premature complex Prolonged PR interval LVH with IVCD and secondary repol abnrm Borderline prolonged QT interval rate slower than previous Confirmed by Richardean Canal [phone]) on 01/04/2020 1:41:16 PM   Radiology CT Head Wo Contrast  Result Date: 01/04/2020 CLINICAL DATA:  Altered mental status and dizziness since this morning. EXAM: CT HEAD WITHOUT CONTRAST TECHNIQUE: Contiguous axial images were obtained from the base of the skull through the vertex without intravenous contrast. COMPARISON:  None. FINDINGS: Brain: Ventricles, cisterns and other CSF spaces are within normal. There is chronic ischemic microvascular disease. Old right caudate lacunar infarct. Patchy low-attenuation over the bilateral basal ganglia most prominent over the region of the genu of the left internal capsule which may be due to subacute to chronic ischemic change. No mass, mass effect or  shift of midline structures. No acute hemorrhage. Vascular: No hyperdense vessel or unexpected calcification. Skull: Normal. Negative for fracture or focal lesion. Sinuses/Orbits: Orbits are normal symmetric. Near complete opacification of the maxillary sinuses bilaterally. Other: None. IMPRESSION: 1. Patchy low-attenuation over the bilateral basal ganglia most prominent over the region of the genu of the left internal capsule which may be due to subacute to chronic ischemic change. 2. Chronic ischemic microvascular disease. Old right caudate lacunar infarct. 3. Chronic sinus inflammatory disease. Electronically Signed   By: Elberta Fortis M.D.  On: 01/04/2020 14:05   MR BRAIN WO CONTRAST  Result Date: 01/04/2020 CLINICAL DATA:  TIA.  Acute change in mental status. EXAM: MRI HEAD WITHOUT CONTRAST TECHNIQUE: Multiplanar, multiecho pulse sequences of the brain and surrounding structures were obtained without intravenous contrast. COMPARISON:  Head CT 01/04/2020 FINDINGS: Brain: There is a 1.5 cm acute infarct involving the genu and posterior limb of the left internal capsule. Patchy T2 hyperintensities in the cerebral white matter bilaterally are nonspecific but compatible with moderate chronic small vessel ischemic disease. There are chronic lacunar infarcts in the basal ganglia bilaterally. There is also a small chronic left. Cerebellar infarct there is mild asymmetric ex vacuo dilatation of the right lateral ventricle. A chronic microhemorrhage is noted in the right frontal lobe. Vascular: Major intracranial vascular flow voids are preserved. Skull and upper cervical spine: No suspicious marrow lesion. Sinuses/Orbits: Unremarkable orbits. Extensive mucosal thickening in the maxillary sinuses. Clear mastoid air cells. Other: None. IMPRESSION: 1. Acute left internal capsule infarct. 2. Moderate chronic small vessel ischemic disease with chronic lacunar infarcts as above. Electronically Signed   By: Sebastian Ache  M.D.   On: 01/04/2020 15:15   DG Chest Port 1 View  Result Date: 01/04/2020 CLINICAL DATA:  Altered mental status. EXAM: PORTABLE CHEST 1 VIEW COMPARISON:  11/29/2019 FINDINGS: Midline trachea. Mild cardiomegaly, accentuated by AP portable technique. Suspect a small left pleural effusion. No pneumothorax. No congestive failure. Left lung base not well evaluated secondary to AP portable technique and low lung volumes. Suspicion of airspace disease. IMPRESSION: Possible small left pleural effusion and adjacent airspace disease. This could represent atelectasis or infection. Consider PA and lateral radiographs if possible. Electronically Signed   By: Jeronimo Greaves M.D.   On: 01/04/2020 14:21    Procedures Procedures (including critical care time)  Medications Ordered in ED Medications  hydrALAZINE (APRESOLINE) injection 5 mg (has no administration in time range)  aspirin tablet 325 mg (325 mg Oral Given 01/04/20 1627)    ED Course  I have reviewed the triage vital signs and the nursing notes.  Pertinent labs & imaging results that were available during my care of the patient were reviewed by me and considered in my medical decision making (see chart for details).    MDM Rules/Calculators/A&P                         Kevin [name] is a 54 y.o. male here presenting with confusion.  Unclear what happened.  Patient seems globally confused.  Patient's blood pressure is elevated 200/120. Concerned for possible PRES vs bleed vs ischemic stroke vs electrolyte abnormalities vs sepsis vs hypertensive urgency vs heart failure. Will get labs, BNP, CT head, MRI brain, UA, CXR.   4:45 PM MRI brain showed L internal capsule infarct. No bleed. Talked to Dr. Napoleon Form from neurology.  He recommend blood pressure 160s to the 180s.  Patient will be admitted to the hospital service and transfer to [facility] for stroke work-up.   Final Clinical Impression(s) / ED Diagnoses Final diagnoses:  Cerebrovascular  accident (CVA), unspecified mechanism (HCC)  Hypertension, unspecified type    Rx / DC Orders ED Discharge Orders    None       Charlynne Pander, MD 01/04/20 1646

## 2020-01-04 NOTE — Treatment Plan (Signed)
Treatment Plan  Asked to do neurology consult on this patient with acute left thalamic region stroke in the setting of hypertensive urgency. I reviewed the CT and MRI images and looked at his notes and labs. He had an echo just ~6 weeks ago showing severely depressed EF < 20% and has elevated BNP and troponin. Concern for cardiac ischemia.  In view of these findings, I feel that the risks of permissive hypertension are outweighed by the risk of worsening coronary ischemia/decompensation. Suggest ASA and clopidogrel 75 mg/d. It would be OK to anticoagulate, if cardiology feels that this is needed for coronary issues.  Repeat echo to look for thrombus, CTA head and neck, and therapy evaluations are appropriate as well.  Neurology will do formal consult when able.  Meredeth Ide, MD

## 2020-01-04 NOTE — Progress Notes (Signed)
Cross cover Patient with hypertensive urgency given with systolic pressures above 20 and no response from hydralazine, one 10 mg dose given earlier in afternoon.  No improvement with additional 10 mg hydralazine dose ordered this evening.  Given a The acute ischemic event and cardiac history with need for gradual BP lowering to goals of 170, cardene ordered to start. [facility]request [facility] to [facility]. Home regimen of antihypertensives resumed and weaning cardene

## 2020-01-04 NOTE — ED Notes (Signed)
RN made aware of BP 

## 2020-01-05 ENCOUNTER — Inpatient Hospital Stay (HOSPITAL_COMMUNITY): Payer: Medicaid Other

## 2020-01-05 ENCOUNTER — Other Ambulatory Visit (HOSPITAL_COMMUNITY): Payer: Self-pay

## 2020-01-05 ENCOUNTER — Encounter (HOSPITAL_COMMUNITY): Payer: Self-pay | Admitting: Internal Medicine

## 2020-01-05 DIAGNOSIS — Z91199 Patient's noncompliance with other medical treatment and regimen due to unspecified reason: Secondary | ICD-10-CM | POA: Insufficient documentation

## 2020-01-05 DIAGNOSIS — I639 Cerebral infarction, unspecified: Secondary | ICD-10-CM

## 2020-01-05 DIAGNOSIS — I5043 Acute on chronic combined systolic (congestive) and diastolic (congestive) heart failure: Secondary | ICD-10-CM

## 2020-01-05 DIAGNOSIS — I6300 Cerebral infarction due to thrombosis of unspecified precerebral artery: Secondary | ICD-10-CM

## 2020-01-05 DIAGNOSIS — R400 Somnolence: Secondary | ICD-10-CM

## 2020-01-05 DIAGNOSIS — I6389 Other cerebral infarction: Secondary | ICD-10-CM

## 2020-01-05 DIAGNOSIS — I10 Essential (primary) hypertension: Secondary | ICD-10-CM | POA: Diagnosis present

## 2020-01-05 LAB — ECHOCARDIOGRAM COMPLETE
AR max vel: 2.94 cm2
AV Area VTI: 3.03 cm2
AV Area mean vel: 2.68 cm2
AV Mean grad: 5 mmHg
AV Peak grad: 9 mmHg
Ao pk vel: 1.5 m/s
Area-P 1/2: 2.17 cm2
Calc EF: 33 %
Height: 74 in
S' Lateral: 4.2 cm
Single Plane A2C EF: 31.8 %
Single Plane A4C EF: 33.2 %
Weight: 3120 oz

## 2020-01-05 LAB — RAPID URINE DRUG SCREEN, HOSP PERFORMED
Amphetamines: NOT DETECTED
Barbiturates: NOT DETECTED
Benzodiazepines: NOT DETECTED
Cocaine: NOT DETECTED
Opiates: NOT DETECTED
Tetrahydrocannabinol: POSITIVE — AB

## 2020-01-05 LAB — HEMOGLOBIN A1C
Hgb A1c MFr Bld: 5.8 % — ABNORMAL HIGH (ref 4.8–5.6)
Mean Plasma Glucose: 119.76 mg/dL

## 2020-01-05 LAB — HEPATIC FUNCTION PANEL
ALT: 13 U/L (ref 0–44)
AST: 15 U/L (ref 15–41)
Albumin: 4.4 g/dL (ref 3.5–5.0)
Alkaline Phosphatase: 77 U/L (ref 38–126)
Bilirubin, Direct: 0.1 mg/dL (ref 0.0–0.2)
Indirect Bilirubin: 0.9 mg/dL (ref 0.3–0.9)
Total Bilirubin: 1 mg/dL (ref 0.3–1.2)
Total Protein: 7.8 g/dL (ref 6.5–8.1)

## 2020-01-05 LAB — CBG MONITORING, ED
Glucose-Capillary: 118 mg/dL — ABNORMAL HIGH (ref 70–99)
Glucose-Capillary: 100 mg/dL — ABNORMAL HIGH (ref 70–99)

## 2020-01-05 LAB — HIV ANTIBODY (ROUTINE TESTING W REFLEX): HIV Screen 4th Generation wRfx: NONREACTIVE

## 2020-01-05 LAB — FOLATE: Folate: 9.9 ng/mL (ref >5.9)

## 2020-01-05 LAB — AMMONIA: Ammonia: 21 umol/L (ref 9–35)

## 2020-01-05 LAB — LIPID PANEL
Cholesterol: 217 mg/dL — ABNORMAL HIGH (ref 0–200)
HDL: 53 mg/dL (ref >40)
LDL Cholesterol: 149 mg/dL — ABNORMAL HIGH (ref 0–99)
Total CHOL/HDL Ratio: 4.1 RATIO
Triglycerides: 77 mg/dL (ref <150)
VLDL: 15 mg/dL (ref 0–40)

## 2020-01-05 LAB — ETHANOL: Alcohol, Ethyl (B): <10 mg/dL (ref <10)

## 2020-01-05 LAB — GLUCOSE, CAPILLARY
Glucose-Capillary: 166 mg/dL — ABNORMAL HIGH (ref 70–99)
Glucose-Capillary: 132 mg/dL — ABNORMAL HIGH (ref 70–99)

## 2020-01-05 LAB — VITAMIN B12: Vitamin B-12: 392 pg/mL (ref 180–914)

## 2020-01-05 LAB — TROPONIN I (HIGH SENSITIVITY)
Troponin I (High Sensitivity): 67 ng/L — ABNORMAL HIGH (ref <18)
Troponin I (High Sensitivity): 94 ng/L — ABNORMAL HIGH (ref <18)

## 2020-01-05 LAB — RPR: RPR Ser Ql: NONREACTIVE

## 2020-01-05 IMAGING — CT CT ANGIO HEAD
2 of 8 series · 7 of 34 positions shown · IV contrast (omnipaque)
Comparison: Prior MRI from [DATE]

CLINICAL DATA: Follow-up examination for acute stroke.

EXAM:
CT ANGIOGRAPHY HEAD AND NECK
TECHNIQUE: Multidetector CT imaging of the head and neck was performed using
the standard protocol during bolus administration of intravenous
contrast. Multiplanar CT image reconstructions and MIPs were
obtained to evaluate the vascular anatomy. Carotid stenosis
measurements (when applicable) are obtained utilizing NASCET
criteria, using the distal internal carotid diameter as the
denominator.
CONTRAST:  100mL OMNIPAQUE IOHEXOL 350 MG/ML SOLN

[Series 8: sagittal soft tissue · sagittal · 0.36mm/px · 3 of 58 slices shown]
[im 6/58  soft-tissue]
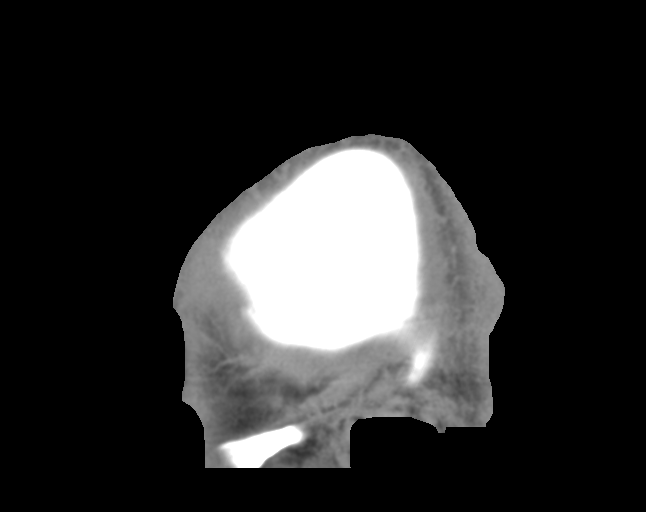
[im 29/58  soft-tissue]
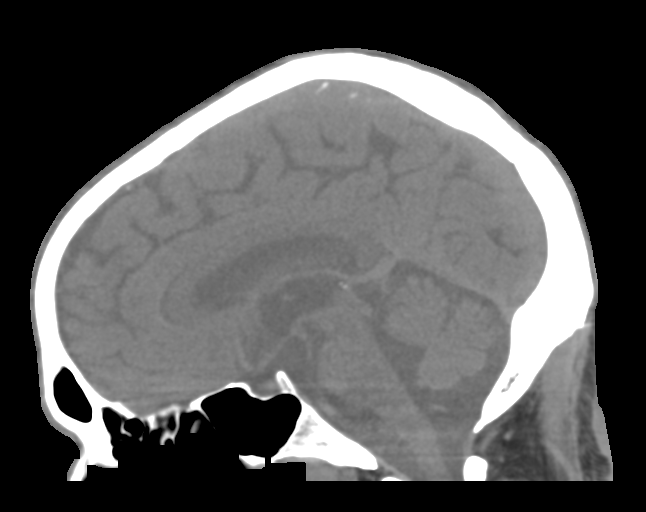
[im 53/58  soft-tissue]
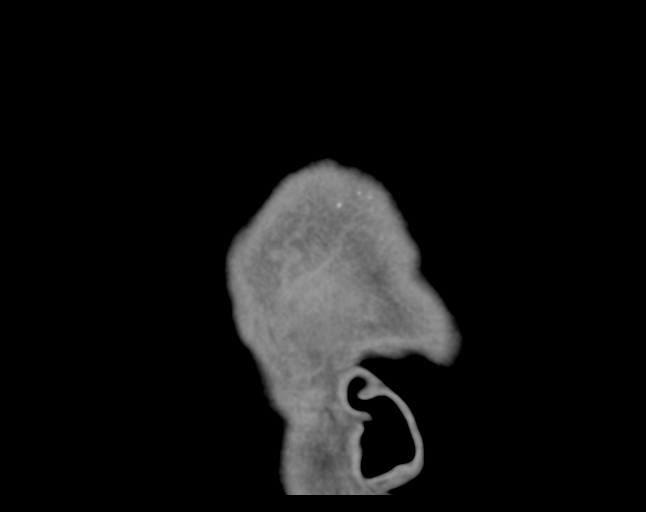

[Series 10: cta head neck · axial · 0.50mm/px · z∈[-290,-56]mm · 4 of 197 slices shown]
[im 40/197  soft-tissue]
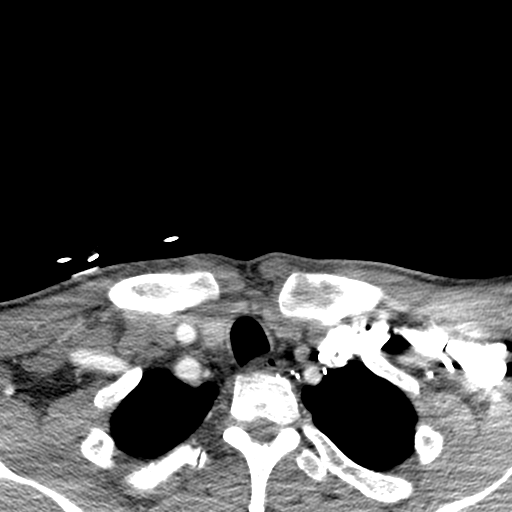
[im 79/197  bone]
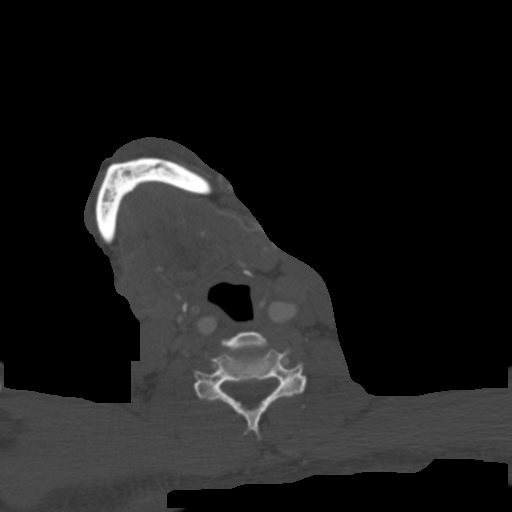
[im 118/197  soft-tissue]
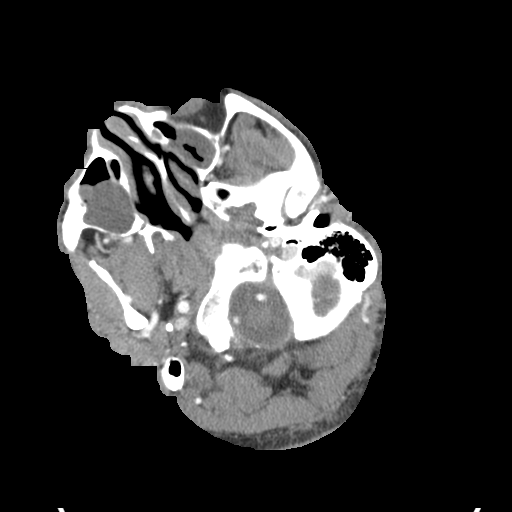
[im 157/197  bone]
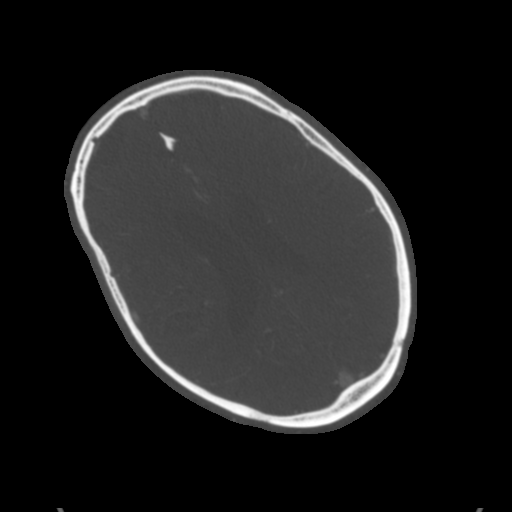

[7 of 34 positions shown; findings below may reference images not displayed]

FINDINGS: CT HEAD FINDINGS

Brain: Atrophy with chronic microvascular ischemic disease again
noted. Scattered remote lacunar infarcts noted about the bilateral
basal ganglia.

Previously identified evolving acute left internal capsule infarct
again noted, stable in size and morphology from prior MRI. No
hemorrhagic transformation, mass effect, or other complication.

No other acute large vessel territory infarct. No intracranial
hemorrhage. No mass lesion, midline shift, or significant mass
effect. No hydrocephalus or extra-axial fluid collection.

Vascular: No hyperdense vessel.

Skull: Scalp soft tissues and calvarium within normal limits.

Sinuses: Moderate mucosal thickening noted within the maxillary
sinuses. Visualized paranasal sinuses are otherwise clear.

Orbits: Globes and orbital soft tissues within normal limits.

Review of the MIP images confirms the above findings

CTA NECK FINDINGS

Aortic arch: Visualized aortic arch of normal caliber with normal
branch pattern. No flow-limiting stenosis about the origin of the
great vessels.

Right carotid system: Right common and internal carotid arteries
patent without stenosis, dissection or occlusion. Mild atheromatous
change about the right bifurcation without stenosis. Right ICA
partially medialized into the retropharyngeal space.

Left carotid system: Left common and internal carotid arteries
widely patent without stenosis, dissection or occlusion. Mild
atheromatous plaque about the left bifurcation without stenosis.

Vertebral arteries: Both vertebral arteries arise from subclavian
arteries. No proximal subclavian artery stenosis. Vertebral arteries
widely patent within the neck without stenosis, dissection or
occlusion.

Skeleton: No visible acute osseous abnormality. No discrete or
worrisome osseous lesions.

Other neck: No other acute soft tissue abnormality within the neck.
No visible mass or adenopathy.

Upper chest: Visualized upper chest demonstrates no acute finding.

Review of the MIP images confirms the above findings

CTA HEAD FINDINGS

Anterior circulation: Examination technically limited by patient
positioning.

Both internal carotid arteries patent to the termini without
stenosis. A1 segments patent bilaterally. Normal anterior
communicating artery complex. Anterior cerebral arteries patent to
their distal aspects without stenosis. No M1 stenosis or occlusion.
Negative MCA bifurcations. Distal MCA branches well perfused and
symmetric.

Posterior circulation: Left V4 segment widely patent to the
vertebrobasilar junction. Short-segment severe distal right V4
stenosis (series 12, image 254). Right V4 segment attenuated
proximally. Left PICA origin patent and normal. Right PICA of
faintly visualize, also grossly patent. Basilar patent to its distal
aspect without stenosis. Superior cerebral arteries patent
bilaterally. Both PCA supplied via the basilar as well as prominent
bilateral posterior communicating arteries. Both PCAs grossly
perfused to their distal aspects.

Venous sinuses: Patent.

Anatomic variants: None significant.

Review of the MIP images confirms the above findings
IMPRESSION: CT HEAD IMPRESSION:

1. Continued interval evolution of previously identified acute left
internal capsule infarct. No evidence for hemorrhagic transformation
or other complication.
2. Underlying moderate chronic microvascular ischemic disease with
chronic lacunar infarcts as above. No other acute intracranial
abnormality.

CTA HEAD AND NECK IMPRESSION:

1. Negative CTA for large vessel occlusion.
2. Short-segment severe near occlusive distal right V4 stenosis.
3. Additional mild for age atheromatous change elsewhere about the
major arterial vasculature of the head and neck. No other
hemodynamically significant or correctable stenosis.

## 2020-01-05 IMAGING — CT CT ANGIO NECK
3 of 4 series · 11 of 33 positions shown · IV contrast (omnipaque)
Comparison: Prior MRI from [DATE]

CLINICAL DATA: Follow-up examination for acute stroke.

EXAM:
CT ANGIOGRAPHY HEAD AND NECK
TECHNIQUE: Multidetector CT imaging of the head and neck was performed using
the standard protocol during bolus administration of intravenous
contrast. Multiplanar CT image reconstructions and MIPs were
obtained to evaluate the vascular anatomy. Carotid stenosis
measurements (when applicable) are obtained utilizing NASCET
criteria, using the distal internal carotid diameter as the
denominator.
CONTRAST:  100mL OMNIPAQUE IOHEXOL 350 MG/ML SOLN

[Series 10: cta head neck · axial · 0.50mm/px · z∈[-270,-74]mm · 3 of 197 slices shown]
[im 50/197  soft-tissue]
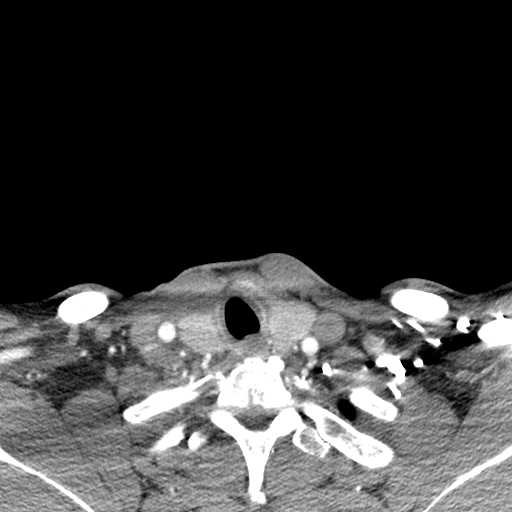
[im 99/197  soft-tissue]
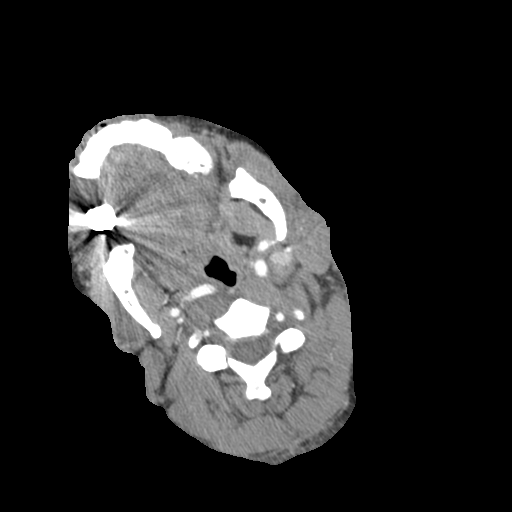
[im 148/197  soft-tissue]
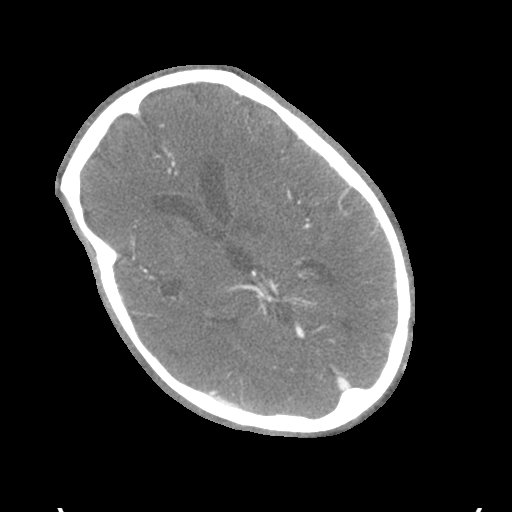

[Series 12: ax thin · axial · 0.46mm/px · z∈[-319,-29]mm · 7 of 388 slices shown]
[im 49/388  soft-tissue]
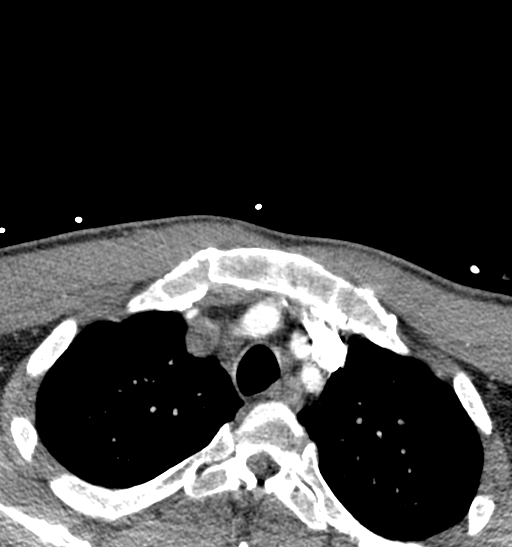
[im 97/388  bone]
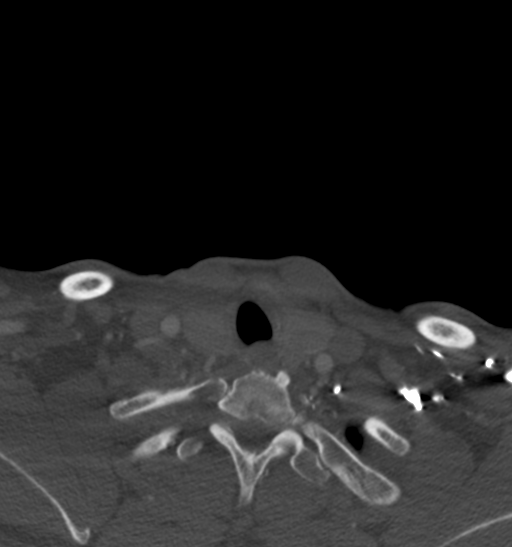
[im 146/388  soft-tissue]
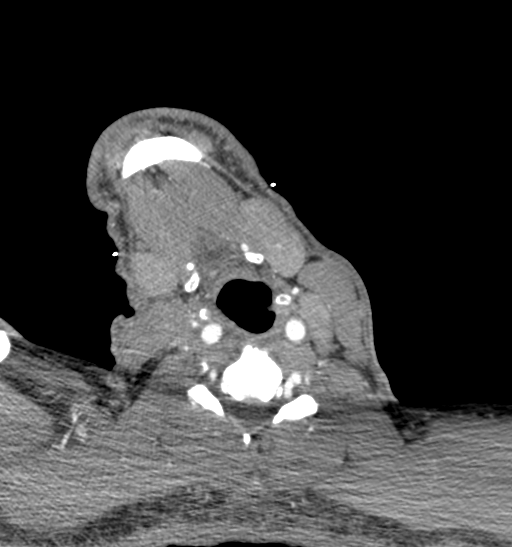
[im 194/388  bone]
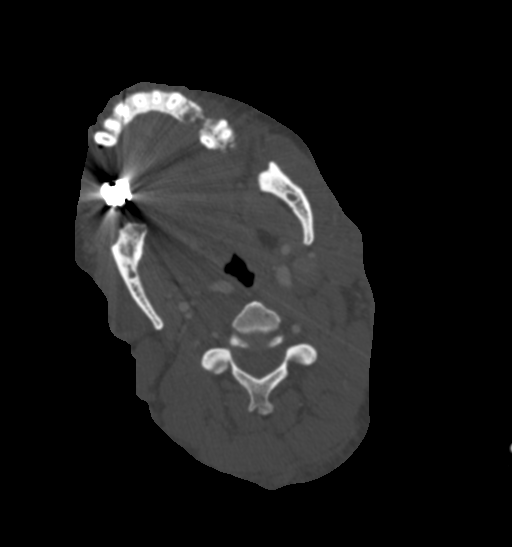
[im 242/388  soft-tissue]
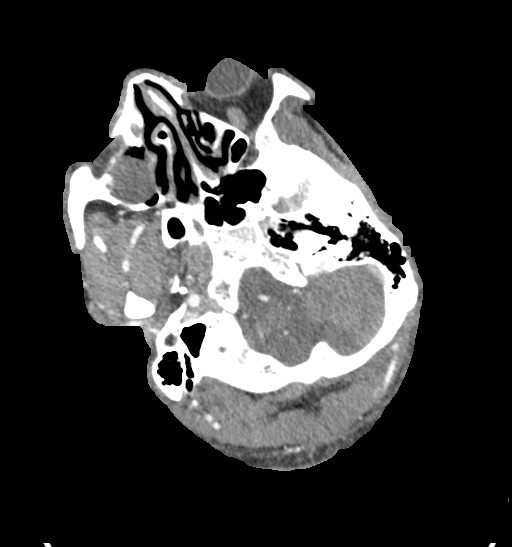
[im 291/388  bone]
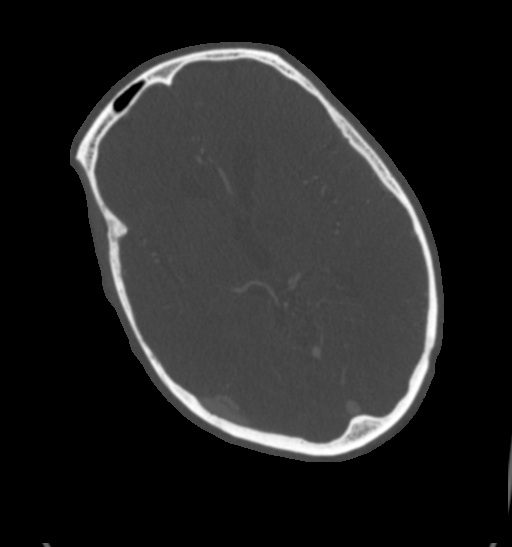
[im 339/388  soft-tissue]
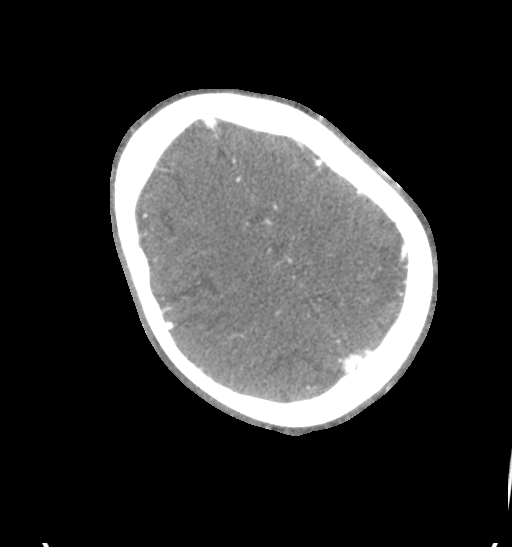

[Series 14: sagittal thin · sagittal · 0.51mm/px · 1 of 224 slices shown]
[im 112/224  soft-tissue]
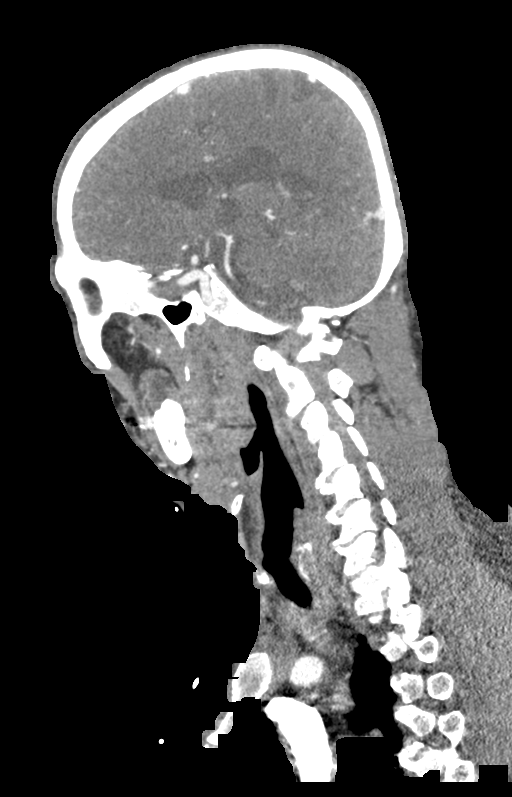

[11 of 33 positions shown; findings below may reference images not displayed]

FINDINGS: CT HEAD FINDINGS

Brain: Atrophy with chronic microvascular ischemic disease again
noted. Scattered remote lacunar infarcts noted about the bilateral
basal ganglia.

Previously identified evolving acute left internal capsule infarct
again noted, stable in size and morphology from prior MRI. No
hemorrhagic transformation, mass effect, or other complication.

No other acute large vessel territory infarct. No intracranial
hemorrhage. No mass lesion, midline shift, or significant mass
effect. No hydrocephalus or extra-axial fluid collection.

Vascular: No hyperdense vessel.

Skull: Scalp soft tissues and calvarium within normal limits.

Sinuses: Moderate mucosal thickening noted within the maxillary
sinuses. Visualized paranasal sinuses are otherwise clear.

Orbits: Globes and orbital soft tissues within normal limits.

Review of the MIP images confirms the above findings

CTA NECK FINDINGS

Aortic arch: Visualized aortic arch of normal caliber with normal
branch pattern. No flow-limiting stenosis about the origin of the
great vessels.

Right carotid system: Right common and internal carotid arteries
patent without stenosis, dissection or occlusion. Mild atheromatous
change about the right bifurcation without stenosis. Right ICA
partially medialized into the retropharyngeal space.

Left carotid system: Left common and internal carotid arteries
widely patent without stenosis, dissection or occlusion. Mild
atheromatous plaque about the left bifurcation without stenosis.

Vertebral arteries: Both vertebral arteries arise from subclavian
arteries. No proximal subclavian artery stenosis. Vertebral arteries
widely patent within the neck without stenosis, dissection or
occlusion.

Skeleton: No visible acute osseous abnormality. No discrete or
worrisome osseous lesions.

Other neck: No other acute soft tissue abnormality within the neck.
No visible mass or adenopathy.

Upper chest: Visualized upper chest demonstrates no acute finding.

Review of the MIP images confirms the above findings

CTA HEAD FINDINGS

Anterior circulation: Examination technically limited by patient
positioning.

Both internal carotid arteries patent to the termini without
stenosis. A1 segments patent bilaterally. Normal anterior
communicating artery complex. Anterior cerebral arteries patent to
their distal aspects without stenosis. No M1 stenosis or occlusion.
Negative MCA bifurcations. Distal MCA branches well perfused and
symmetric.

Posterior circulation: Left V4 segment widely patent to the
vertebrobasilar junction. Short-segment severe distal right V4
stenosis (series 12, image 254). Right V4 segment attenuated
proximally. Left PICA origin patent and normal. Right PICA of
faintly visualize, also grossly patent. Basilar patent to its distal
aspect without stenosis. Superior cerebral arteries patent
bilaterally. Both PCA supplied via the basilar as well as prominent
bilateral posterior communicating arteries. Both PCAs grossly
perfused to their distal aspects.

Venous sinuses: Patent.

Anatomic variants: None significant.

Review of the MIP images confirms the above findings
IMPRESSION: CT HEAD IMPRESSION:

1. Continued interval evolution of previously identified acute left
internal capsule infarct. No evidence for hemorrhagic transformation
or other complication.
2. Underlying moderate chronic microvascular ischemic disease with
chronic lacunar infarcts as above. No other acute intracranial
abnormality.

CTA HEAD AND NECK IMPRESSION:

1. Negative CTA for large vessel occlusion.
2. Short-segment severe near occlusive distal right V4 stenosis.
3. Additional mild for age atheromatous change elsewhere about the
major arterial vasculature of the head and neck. No other
hemodynamically significant or correctable stenosis.

## 2020-01-05 MED ORDER — ASPIRIN 300 MG RE SUPP
300.0000 mg | Freq: Every day | RECTAL | Status: DC
  Filled 2020-01-05: qty 1

## 2020-01-05 MED ORDER — HYDROCHLOROTHIAZIDE 25 MG PO TABS
25.0000 mg | ORAL_TABLET | Freq: Two times a day (BID) | ORAL | Status: DC
Start: 2020-01-05 — End: 2020-01-05
  Filled 2020-01-05: qty 1

## 2020-01-05 MED ORDER — ACETAMINOPHEN 160 MG/5ML PO SOLN: 650.0000 mg | ORAL | Status: DC | PRN

## 2020-01-05 MED ORDER — THIAMINE HCL 100 MG/ML IJ SOLN
100.0000 mg | Freq: Every day | INTRAMUSCULAR | Status: DC
  Administered 2020-01-05: 05:00:00 100 mg via INTRAVENOUS
  Filled 2020-01-05: qty 2

## 2020-01-05 MED ORDER — ONDANSETRON HCL 4 MG/2ML IJ SOLN
4.0000 mg | Freq: Once | INTRAMUSCULAR | Status: AC
  Administered 2020-01-05: 4 mg via INTRAVENOUS
  Filled 2020-01-05: qty 2

## 2020-01-05 MED ORDER — HYDROCHLOROTHIAZIDE 25 MG PO TABS: 25.0000 mg | ORAL_TABLET | Freq: Two times a day (BID) | ORAL | Status: DC

## 2020-01-05 MED ORDER — CLOPIDOGREL BISULFATE 75 MG PO TABS
75.0000 mg | ORAL_TABLET | Freq: Every day | ORAL | Status: DC
  Administered 2020-01-05 – 2020-01-08 (×4): 75 mg via ORAL
  Filled 2020-01-05 (×4): qty 1

## 2020-01-05 MED ORDER — ACETAMINOPHEN 650 MG RE SUPP: 650.0000 mg | RECTAL | Status: DC | PRN

## 2020-01-05 MED ORDER — ASPIRIN 325 MG PO TABS
325.0000 mg | ORAL_TABLET | Freq: Every day | ORAL | Status: DC
  Administered 2020-01-05 – 2020-01-06 (×2): 325 mg via ORAL
  Filled 2020-01-05 (×2): qty 1

## 2020-01-05 MED ORDER — SENNOSIDES-DOCUSATE SODIUM 8.6-50 MG PO TABS: 1.0000 | ORAL_TABLET | Freq: Every evening | ORAL | Status: DC | PRN

## 2020-01-05 MED ORDER — AMLODIPINE BESYLATE 10 MG PO TABS
10.0000 mg | ORAL_TABLET | Freq: Every day | ORAL | Status: DC
  Administered 2020-01-05 – 2020-01-07 (×3): 10 mg via ORAL
  Filled 2020-01-05 (×2): qty 2
  Filled 2020-01-05 (×2): qty 1

## 2020-01-05 MED ORDER — INSULIN ASPART 100 UNIT/ML ~~LOC~~ SOLN
0.0000 [IU] | Freq: Three times a day (TID) | SUBCUTANEOUS | Status: DC
  Administered 2020-01-05 – 2020-01-06 (×2): 1 [IU] via SUBCUTANEOUS
  Administered 2020-01-08: 14:00:00 2 [IU] via SUBCUTANEOUS
  Filled 2020-01-05: qty 0.06

## 2020-01-05 MED ORDER — FUROSEMIDE 10 MG/ML IJ SOLN
20.0000 mg | Freq: Two times a day (BID) | INTRAMUSCULAR | Status: DC
  Administered 2020-01-05 – 2020-01-08 (×6): 20 mg via INTRAVENOUS
  Filled 2020-01-05 (×6): qty 2

## 2020-01-05 MED ORDER — ACETAMINOPHEN 325 MG PO TABS: 650.0000 mg | ORAL_TABLET | ORAL | Status: DC | PRN

## 2020-01-05 MED ORDER — HYDRALAZINE HCL 20 MG/ML IJ SOLN: 10.0000 mg | Freq: Three times a day (TID) | INTRAMUSCULAR | Status: DC | PRN

## 2020-01-05 MED ORDER — THIAMINE HCL 100 MG PO TABS
100.0000 mg | ORAL_TABLET | Freq: Every day | ORAL | Status: DC
  Administered 2020-01-06 – 2020-01-08 (×3): 100 mg via ORAL
  Filled 2020-01-05 (×4): qty 1

## 2020-01-05 MED ORDER — ATORVASTATIN CALCIUM 80 MG PO TABS
80.0000 mg | ORAL_TABLET | Freq: Every day | ORAL | Status: DC
  Administered 2020-01-05 – 2020-01-08 (×4): 80 mg via ORAL
  Filled 2020-01-05 (×3): qty 1
  Filled 2020-01-05: qty 2

## 2020-01-05 MED ORDER — STROKE: EARLY STAGES OF RECOVERY BOOK
Freq: Once | Status: DC
  Filled 2020-01-05 (×2): qty 1

## 2020-01-05 MED ORDER — LISINOPRIL 20 MG PO TABS
20.0000 mg | ORAL_TABLET | Freq: Every day | ORAL | Status: DC
  Administered 2020-01-05: 08:00:00 20 mg via ORAL
  Filled 2020-01-05 (×2): qty 1

## 2020-01-05 MED ORDER — IOHEXOL 350 MG/ML SOLN
100.0000 mL | Freq: Once | INTRAVENOUS | Status: AC | PRN
  Administered 2020-01-05: 03:00:00 100 mL via INTRAVENOUS

## 2020-01-05 NOTE — ED Notes (Signed)
[facility] picking up patient and taking to [facility]

## 2020-01-05 NOTE — ED Notes (Signed)
[facility] given report.  

## 2020-01-05 NOTE — Progress Notes (Signed)
Stroke Team Progress Note  SUBJECTIVE I have personally reviewed history of presenting illness with the patient, electronic medical records and imaging films in PACS.  He presented with a 5-day history of right-sided weakness in the setting of elevated blood pressure.  MRI scan shows a 1.5 cm left internal capsule subcortical infarct.  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  LDL cholesterol is elevated at 149 mg percent.  Hemoglobin A1c is 5.8.  Urine drug screen is positive for marijuana.  Echocardiogram is pending.  Patient states he was not on any antiplatelet therapy at home and admits to smoking cigarettes and using marijuana. He denies any prior history of strokes TIAs or significant neurological problems OBJECTIVE Most recent Vital Signs: Temp: 97.3 F (36.3 C) (12/25 1144) Temp Source: Axillary (12/25 1144) BP: 161/96 (12/25 1100) Pulse Rate: 63 (12/25 1144) Respiratory Rate: 16 O2 Saturdation: 98%  CBG (last 3)  Recent Labs    01/04/20 1236 01/05/20 0816 01/05/20 1109  GLUCAP 189* 118* 100*       Studies:  CT** MRI** CTA** ECHO** LDL** HbA1c**  Physical Exam:    Pleasant middle-age African-American male not in distress.  . Afebrile. Head is nontraumatic. Neck is supple without bruit.    Cardiac exam no murmur or gallop. Lungs are clear to auscultation. Distal pulses are well felt. Neurological Exam ;  Awake  Alert oriented x 3. Normal speech and language.eye movements full without nystagmus.fundi were not visualized. Vision acuity and fields appear normal. Hearing is normal. Palatal movements are normal. Face symmetric. Tongue midline. Normal strength, tone, reflexes and coordination. Normal sensation.  Mild diminished fine finger movements on the right hand mild right grip weakness and orbits left over right upper extremity.  Gait deferred.  ASSESSMENT [name] is a  54 y.o. male with subacute right-sided weakness secondary to left brain subcortical infarct likely from small vessel disease but large infarct size would also raise suspicion for embolic stroke.  Vascular risk factors of hypertension hyperlipidemia and smoking    Hospital day # 1  TREATMENT/PLAN Recommend aspirin 81 mg and Plavix 75 mg daily for 3 weeks followed by aspirin alone.  Add statin for elevated lipids.  Check echocardiogram.  Physical occupational therapy consults.  Patient counseled to quit smoking cigarettes and marijuana and is agreeable.  Aggressive risk factor modification.  Mobilize out of bed.  Physical occupational therapy consults.  Discussed with Dr. Allena Katz  I spent 35 minutes in total face-to-face time with the patient, more than 50% of which was spent in counseling and coordination of care, reviewing test results, reviewing medication and discussing or reviewing the diagnosis of lacunar stroke   , the prognosis and treatment options.   Delia Heady, MD Medical Director [facility] Pager: [phone] 01/05/2020 12:10 PM

## 2020-01-05 NOTE — Progress Notes (Signed)
[facility] Progress Note  Patient: Kevin [name]    LKT:[id number]  DOA: 01/04/2020     Date of Service: the patient was seen and examined on 01/05/2020  Brief hospital course: Past medical history of HTN, HFrEF, type II DM, HLD, substance abuse.  Presents with complaints of confusion.  History still limited from the patient.  Found to have acute CVA Currently plan is monitor on telemetry and further stroke work-up.  Assessment and Plan: 1.  Acute left internal capsule infarct Acute metabolic encephalopathy Telemetry neuro was consulted. Not a candidate for TPA due to out of the window.. CT head patchy bilateral basal ganglia attenuation, subacute to chronic along with chronic microvascular disease and old right caudate lacunar infarct. MRI brain acute left internal capsule infarct. CTA head and neck-no hemorrhagic transformation.  Negative for LVO.  Severe near occlusive distal right V4 stenosis. Echocardiogram currently pending. Patient passed swallow evaluation in the ER. PT OT evaluation pending. Hemoglobin A1c 5.8 well controlled. LDL 149, on Lipitor 80 mg. No antiplatelet medication prior to admission.  Currently on combination aspirin Plavix.  2.  Hypertensive urgency Blood pressure increased significantly. Patient was started on nicardipine drip. Currently blood pressure is actually stable. We will allow permissive hypertension. Would not treat unless systolic more than 200 or diastolic more than 120. Patient was on Norvasc and lisinopril as well as HCTZ prior to admission. Currently only on Norvasc.  3.  Type 2 diabetes mellitus, controlled, with HLD Hemoglobin A1c 5.8. On Metformin at home. Currently on sliding scale insulin. Resume lisinopril when stable.  4.  Acute metabolic encephalopathy Mentation gradually improving but still not back to baseline. Alcohol negative, ammonia negative, folic acid 9.9 relatively low, B12 392 relatively low, RPR  nonreactive.  B1 currently pending but on replacement, HIV negative. UDS positive for cannabis. Continue to monitor for now.  5.  Chronic systolic and diastolic CHF Mild elevated troponin Seen on last admission last month but left AMA before further work-up was completed. EF 20% with global wall motion abnormalities. Volume currently appears stable. No ischemic work-up given patient's lack of insight and noncompliance with medical regimen. Currently no chest pain reported as well. Troponin elevation not consistent with ACS likely secondary to hypertensive urgency and demand ischemia.  6.  Active smoker Counseled to quit.  Body mass index is 25.04 kg/m.    Interventions:        Diet: Cardiac diet carb modified DVT Prophylaxis:   SCD's Start: 01/05/20 0131    Advance goals of care discussion: Full code  Family Communication: no family was present at bedside, at the time of interview.   Disposition:  Status is: Inpatient  Remains inpatient appropriate because:IV treatments appropriate due to intensity of illness or inability to take PO   Dispo: The patient is from: Home              Anticipated d/c is to: Home              Anticipated d/c date is: 1 day              Patient currently is not medically stable to d/c.  Subjective: No nausea no vomiting.  No fever no chills.  No acute complaints.  Still confused.  Physical Exam:  General: Appear in mild distress, no Rash; Oral Mucosa Clear, moist. no Abnormal Neck Mass Or lumps, Conjunctiva normal  Cardiovascular: S1 and S2 Present, no Murmur, Respiratory: good respiratory effort, Bilateral Air entry present and  CTA, no Crackles, no wheezes Abdomen: Bowel Sound present, Soft and no tenderness Extremities: no Pedal edema Neurology: alert and oriented to place and person affect appropriate. no new focal deficit Gait not checked due to patient safety concerns  Vitals:   01/05/20 1000 01/05/20 1030 01/05/20 1100  01/05/20 1144  BP: (!) 125/105 (!) 141/83 (!) 161/96   Pulse: (!) 59 74 73 63  Resp: Temp:  98 F (36.7 C) 98.1 F (36.7 C) (!) 97.3 F (36.3 C)  TempSrc:  Oral Oral Axillary  SpO2: 97% 97% 98%   Weight:      Height:        Intake/Output Summary (Last 24 hours) at 01/05/2020 1149 Last data filed at 01/05/2020 0325 Gross per 24 hour  Intake --  Output 1400 ml  Net -1400 ml   Filed Weights   01/04/20 1254  Weight: 88.5 kg    Data Reviewed: I have personally reviewed and interpreted daily labs, tele strips, imagings as discussed above. I reviewed all nursing notes, pharmacy notes, vitals, pertinent old records I have discussed plan of care as described above with RN and patient/family.  CBC: Recent Labs  Lab 01/04/20 1313  WBC 3.5*  HGB 14.6  HCT 44.9  MCV 91.6  PLT 95*   Basic Metabolic Panel: Recent Labs  Lab 01/04/20 1313  NA 139  K 3.8  CL 104  CO2 24  GLUCOSE 169*  BUN 19  CREATININE 1.27*  CALCIUM 9.0    Studies: CT ANGIO HEAD W OR WO CONTRAST  Result Date: 01/05/2020 CLINICAL DATA:  Follow-up examination for acute stroke. EXAM: CT ANGIOGRAPHY HEAD AND NECK TECHNIQUE: Multidetector CT imaging of the head and neck was performed using the standard protocol during bolus administration of intravenous contrast. Multiplanar CT image reconstructions and MIPs were obtained to evaluate the vascular anatomy. Carotid stenosis measurements (when applicable) are obtained utilizing NASCET criteria, using the distal internal carotid diameter as the denominator. CONTRAST:  OMNIPAQUE IOHEXOL 350 MG/ML SOLN COMPARISON:  Prior MRI from 01/04/2020 FINDINGS: CT HEAD FINDINGS Brain: Atrophy with chronic microvascular ischemic disease again noted. Scattered remote lacunar infarcts noted about the bilateral basal ganglia. Previously identified evolving acute left internal capsule infarct again noted, stable in size and morphology from prior MRI. No hemorrhagic  transformation, mass effect, or other complication. No other acute large vessel territory infarct. No intracranial hemorrhage. No mass lesion, midline shift, or significant mass effect. No hydrocephalus or extra-axial fluid collection. Vascular: No hyperdense vessel. Skull: Scalp soft tissues and calvarium within normal limits. Sinuses: Moderate mucosal thickening noted within the maxillary sinuses. Visualized paranasal sinuses are otherwise clear. Orbits: Globes and orbital soft tissues within normal limits. Review of the MIP images confirms the above findings CTA NECK FINDINGS Aortic arch: Visualized aortic arch of normal caliber with normal branch pattern. No flow-limiting stenosis about the origin of the great vessels. Right carotid system: Right common and internal carotid arteries patent without stenosis, dissection or occlusion. Mild atheromatous change about the right bifurcation without stenosis. Right ICA partially medialized into the retropharyngeal space. Left carotid system: Left common and internal carotid arteries widely patent without stenosis, dissection or occlusion. Mild atheromatous plaque about the left bifurcation without stenosis. Vertebral arteries: Both vertebral arteries arise from subclavian arteries. No proximal subclavian artery stenosis. Vertebral arteries widely patent within the neck without stenosis, dissection or occlusion. Skeleton: No visible acute osseous abnormality. No discrete or worrisome osseous lesions. Other neck: No other  acute soft tissue abnormality within the neck. No visible mass or adenopathy. Upper chest: Visualized upper chest demonstrates no acute finding. Review of the MIP images confirms the above findings CTA HEAD FINDINGS Anterior circulation: Examination technically limited by patient positioning. Both internal carotid arteries patent to the termini without stenosis. A1 segments patent bilaterally. Normal anterior communicating artery complex. Anterior  cerebral arteries patent to their distal aspects without stenosis. No M1 stenosis or occlusion. Negative MCA bifurcations. Distal MCA branches well perfused and symmetric. Posterior circulation: Left V4 segment widely patent to the vertebrobasilar junction. Short-segment severe distal right V4 stenosis (series 12, image 254). Right V4 segment attenuated proximally. Left PICA origin patent and normal. Right PICA of faintly visualize, also grossly patent. Basilar patent to its distal aspect without stenosis. Superior cerebral arteries patent bilaterally. Both PCA supplied via the basilar as well as prominent bilateral posterior communicating arteries. Both PCAs grossly perfused to their distal aspects. Venous sinuses: Patent. Anatomic variants: None significant. Review of the MIP images confirms the above findings IMPRESSION: CT HEAD IMPRESSION: 1. Continued interval evolution of previously identified acute left internal capsule infarct. No evidence for hemorrhagic transformation or other complication. 2. Underlying moderate chronic microvascular ischemic disease with chronic lacunar infarcts as above. No other acute intracranial abnormality. CTA HEAD AND NECK IMPRESSION: 1. Negative CTA for large vessel occlusion. 2. Short-segment severe near occlusive distal right V4 stenosis. 3. Additional mild for age atheromatous change elsewhere about the major arterial vasculature of the head and neck. No other hemodynamically significant or correctable stenosis. Electronically Signed   By: Rise Mu M.D.   On: 01/05/2020 04:37   CT Head Wo Contrast  Result Date: 01/04/2020 CLINICAL DATA:  Altered mental status and dizziness since this morning. EXAM: CT HEAD WITHOUT CONTRAST TECHNIQUE: Contiguous axial images were obtained from the base of the skull through the vertex without intravenous contrast. COMPARISON:  None. FINDINGS: Brain: Ventricles, cisterns and other CSF spaces are within normal. There is chronic  ischemic microvascular disease. Old right caudate lacunar infarct. Patchy low-attenuation over the bilateral basal ganglia most prominent over the region of the genu of the left internal capsule which may be due to subacute to chronic ischemic change. No mass, mass effect or shift of midline structures. No acute hemorrhage. Vascular: No hyperdense vessel or unexpected calcification. Skull: Normal. Negative for fracture or focal lesion. Sinuses/Orbits: Orbits are normal symmetric. Near complete opacification of the maxillary sinuses bilaterally. Other: None. IMPRESSION: 1. Patchy low-attenuation over the bilateral basal ganglia most prominent over the region of the genu of the left internal capsule which may be due to subacute to chronic ischemic change. 2. Chronic ischemic microvascular disease. Old right caudate lacunar infarct. 3. Chronic sinus inflammatory disease. Electronically Signed   By: Elberta Fortis M.D.   On: 01/04/2020 14:05   CT ANGIO NECK W OR WO CONTRAST  Result Date: 01/05/2020 CLINICAL DATA:  Follow-up examination for acute stroke. EXAM: CT ANGIOGRAPHY HEAD AND NECK TECHNIQUE: Multidetector CT imaging of the head and neck was performed using the standard protocol during bolus administration of intravenous contrast. Multiplanar CT image reconstructions and MIPs were obtained to evaluate the vascular anatomy. Carotid stenosis measurements (when applicable) are obtained utilizing NASCET criteria, using the distal internal carotid diameter as the denominator. CONTRAST:  OMNIPAQUE IOHEXOL 350 MG/ML SOLN COMPARISON:  Prior MRI from 01/04/2020 FINDINGS: CT HEAD FINDINGS Brain: Atrophy with chronic microvascular ischemic disease again noted. Scattered remote lacunar infarcts noted about the bilateral basal ganglia. Previously  identified evolving acute left internal capsule infarct again noted, stable in size and morphology from prior MRI. No hemorrhagic transformation, mass effect, or other  complication. No other acute large vessel territory infarct. No intracranial hemorrhage. No mass lesion, midline shift, or significant mass effect. No hydrocephalus or extra-axial fluid collection. Vascular: No hyperdense vessel. Skull: Scalp soft tissues and calvarium within normal limits. Sinuses: Moderate mucosal thickening noted within the maxillary sinuses. Visualized paranasal sinuses are otherwise clear. Orbits: Globes and orbital soft tissues within normal limits. Review of the MIP images confirms the above findings CTA NECK FINDINGS Aortic arch: Visualized aortic arch of normal caliber with normal branch pattern. No flow-limiting stenosis about the origin of the great vessels. Right carotid system: Right common and internal carotid arteries patent without stenosis, dissection or occlusion. Mild atheromatous change about the right bifurcation without stenosis. Right ICA partially medialized into the retropharyngeal space. Left carotid system: Left common and internal carotid arteries widely patent without stenosis, dissection or occlusion. Mild atheromatous plaque about the left bifurcation without stenosis. Vertebral arteries: Both vertebral arteries arise from subclavian arteries. No proximal subclavian artery stenosis. Vertebral arteries widely patent within the neck without stenosis, dissection or occlusion. Skeleton: No visible acute osseous abnormality. No discrete or worrisome osseous lesions. Other neck: No other acute soft tissue abnormality within the neck. No visible mass or adenopathy. Upper chest: Visualized upper chest demonstrates no acute finding. Review of the MIP images confirms the above findings CTA HEAD FINDINGS Anterior circulation: Examination technically limited by patient positioning. Both internal carotid arteries patent to the termini without stenosis. A1 segments patent bilaterally. Normal anterior communicating artery complex. Anterior cerebral arteries patent to their distal  aspects without stenosis. No M1 stenosis or occlusion. Negative MCA bifurcations. Distal MCA branches well perfused and symmetric. Posterior circulation: Left V4 segment widely patent to the vertebrobasilar junction. Short-segment severe distal right V4 stenosis (series 12, image 254). Right V4 segment attenuated proximally. Left PICA origin patent and normal. Right PICA of faintly visualize, also grossly patent. Basilar patent to its distal aspect without stenosis. Superior cerebral arteries patent bilaterally. Both PCA supplied via the basilar as well as prominent bilateral posterior communicating arteries. Both PCAs grossly perfused to their distal aspects. Venous sinuses: Patent. Anatomic variants: None significant. Review of the MIP images confirms the above findings IMPRESSION: CT HEAD IMPRESSION: 1. Continued interval evolution of previously identified acute left internal capsule infarct. No evidence for hemorrhagic transformation or other complication. 2. Underlying moderate chronic microvascular ischemic disease with chronic lacunar infarcts as above. No other acute intracranial abnormality. CTA HEAD AND NECK IMPRESSION: 1. Negative CTA for large vessel occlusion. 2. Short-segment severe near occlusive distal right V4 stenosis. 3. Additional mild for age atheromatous change elsewhere about the major arterial vasculature of the head and neck. No other hemodynamically significant or correctable stenosis. Electronically Signed   By: Rise Mu M.D.   On: 01/05/2020 04:37   MR BRAIN WO CONTRAST  Result Date: 01/04/2020 CLINICAL DATA:  TIA.  Acute change in mental status. EXAM: MRI HEAD WITHOUT CONTRAST TECHNIQUE: Multiplanar, multiecho pulse sequences of the brain and surrounding structures were obtained without intravenous contrast. COMPARISON:  Head CT 01/04/2020 FINDINGS: Brain: There is a 1.5 cm acute infarct involving the genu and posterior limb of the left internal capsule. Patchy T2  hyperintensities in the cerebral white matter bilaterally are nonspecific but compatible with moderate chronic small vessel ischemic disease. There are chronic lacunar infarcts in the basal ganglia bilaterally. There is also  a small chronic left. Cerebellar infarct there is mild asymmetric ex vacuo dilatation of the right lateral ventricle. A chronic microhemorrhage is noted in the right frontal lobe. Vascular: Major intracranial vascular flow voids are preserved. Skull and upper cervical spine: No suspicious marrow lesion. Sinuses/Orbits: Unremarkable orbits. Extensive mucosal thickening in the maxillary sinuses. Clear mastoid air cells. Other: None. IMPRESSION: 1. Acute left internal capsule infarct. 2. Moderate chronic small vessel ischemic disease with chronic lacunar infarcts as above. Electronically Signed   By: Sebastian AcheAllen  Grady M.D.   On: 01/04/2020 15:15   DG Chest Port 1 View  Result Date: 01/04/2020 CLINICAL DATA:  Altered mental status. EXAM: PORTABLE CHEST 1 VIEW COMPARISON:  11/29/2019 FINDINGS: Midline trachea. Mild cardiomegaly, accentuated by AP portable technique. Suspect a small left pleural effusion. No pneumothorax. No congestive failure. Left lung base not well evaluated secondary to AP portable technique and low lung volumes. Suspicion of airspace disease. IMPRESSION: Possible small left pleural effusion and adjacent airspace disease. This could represent atelectasis or infection. Consider PA and lateral radiographs if possible. Electronically Signed   By: Jeronimo GreavesKyle  Talbot M.D.   On: 01/04/2020 14:21    Scheduled Meds: .  stroke: mapping our early stages of recovery book   Does not apply Once  . amLODipine  10 mg Oral Daily  . aspirin  300 mg Rectal Daily   Or  . aspirin  325 mg Oral Daily  . atorvastatin  80 mg Oral Daily  . clopidogrel  75 mg Oral Daily  . insulin aspart  0-6 Units Subcutaneous TID WC  . thiamine injection  100 mg Intravenous Daily   Or  . thiamine  100 mg Oral  Daily   Continuous Infusions: PRN Meds: acetaminophen **OR** acetaminophen (TYLENOL) oral liquid 160 mg/5 mL **OR** acetaminophen, senna-docusate  Time spent: 35 minutes  Author: Lynden OxfordPranav Jocabed Cheese, MD [facility] 01/05/2020 11:49 AM  To reach On-call, see care teams to locate the attending and reach out via [email or web address]. Between 7PM-7AM, please contact night-coverage If you still have difficulty reaching the attending provider, please page the [facility] (Director on Call) for [facility] on amion for assistance.

## 2020-01-05 NOTE — Progress Notes (Signed)
Pt admission documentation unable to be completed at this time due to pt current mental status post stroke. Attempted to contact pt daughter at 16, left a voicemail to Patent attorney when available.

## 2020-01-05 NOTE — ED Notes (Signed)
Patient transported to CT 

## 2020-01-05 NOTE — Consult Note (Addendum)
NEUROLOGY CONSULTATION NOTE   Date of service: January 05, 2020 Patient Name: Kevin [name] [place] MRN:  [id number] DOB:  1965-03-20 Reason for consult: "Stroke" _ _ _   _ __   _ __ _ _  __ __   _ __   __ _  History of Present Illness  Kevin [name] [place] is a 54 y.o. male with PMH significant for DM2, HTN, blind in left eye who presents with altered mental status. Vital with hypertension, labs with elevated troponin and BNP, UA not concerning for a UTI, CXR with left pleural effusion and adjacent airspace disease. MRI Brain demonstrated an acute left internal capsule infarct. Neurology consulted for further evaluation and workup.  On my evaluation, patient is somnolent, yawning, opens eyes briefly follows commands, poor participation with exam and poor attention precludes any meaningful history. Endorses drinking a fourth of a beer every other day, eats a good mix of meat vegetables and dairy but not sure if I trust the history provided by him given his somnolence and encephalopathy.   ROS   Unable to obtain a detailed ROS due to somnolence. Denies any pain.  Past History   Past Medical History:  Diagnosis Date  . Blind left eye   . Diabetes mellitus without complication (HCC)   . Hypertension    Past Surgical History:  Procedure Laterality Date  . GYNECOMASTIA EXCISION     Family History  Problem Relation Age of Onset  . Diabetes Mother   . Hypertension Mother   . Hypertension Father   . Diabetes Maternal Grandmother   . Hypertension Maternal Grandmother   . Arthritis Maternal Grandmother   . Congestive Heart Failure Neg Hx    Social History   Socioeconomic History  . Marital status: Single    Spouse name: Not on file  . Number of children: Not on file  . Years of education: Not on file  . Highest education level: Not on file  Occupational History  . Occupation: trying to get disability  Tobacco Use  . Smoking status: Current Every Day Smoker    Packs/day: 2.00     Years: 40.00    Pack years: 80.00  . Smokeless tobacco: Never Used  Vaping Use  . Vaping Use: Some days  Substance and Sexual Activity  . Alcohol use: Yes    Comment: denies heavy use  . Drug use: Yes    Types: Marijuana  . Sexual activity: Not on file  Other Topics Concern  . Not on file  Social History Narrative  . Not on file   Social Determinants of Health   Financial Resource Strain: Not on file  Food Insecurity: Not on file  Transportation Needs: Not on file  Physical Activity: Not on file  Stress: Not on file  Social Connections: Not on file   No Known Allergies  Medications  (Not in a hospital admission)    Vitals   Vitals:   01/05/20 0135 01/05/20 0140 01/05/20 0145 01/05/20 0150  BP: (!) 165/112 (!) 152/98 (!) 169/92 (!) 164/87  Pulse: 100 86 75 71  Resp: 17 12 16 16   Temp:      TempSrc:      SpO2: 96% 94% 96% 95%  Weight:      Height:         Body mass index is 25.04 kg/m.  Physical Exam   General: Laying comfortably in bed; in no acute distress. HENT: Normal oropharynx and mucosa. Normal external appearance of ears  and nose. Neck: Supple, no pain or tenderness CV: No JVD. No peripheral edema. Pulmonary: Symmetric Chest rise. Normal respiratory effort. Abdomen: Soft to touch, non-tender. Ext: No cyanosis, edema, or deformity Skin: No rash. Normal palpation of skin.  Musculoskeletal: Normal digits and nails by inspection. No clubbing.  Neurologic Examination  Mental status/Cognition: Somnolent, oriented to self only but not to place, month and year, poor attention, keeps yawning and falling asleep during exam. Speech/language: Fluent, comprehension intact, object naming intact Cranial nerves:   CN II Pupils equal and reactive to light, blinks to threat bilaterally.   CN III,IV,VI EOM intact, no gaze preference or deviation, no nystagmus   CN V normal sensation in V1, V2, and V3 segments bilaterally   CN VII no asymmetry, no nasolabial fold  flattening   CN VIII normal hearing to speech   CN IX & X normal palatal elevation, no uvular deviation   CN XI    CN XII midline tongue protrusion   Motor:  Muscle bulk: poor, tone normal, pronator drift none tremor none Mvmt Root Nerve  Muscle Right Left Comments  SA C5/6 Ax Deltoid 5 5   EF C5/6 Mc Biceps 5 5   EE C6/7/8 Rad Triceps 5 5   WF C6/7 Med FCR     WE C7/8 PIN ECU     F Ab C8/T1 U ADM/FDI 4+ 5   HF L1/2/3 Fem Illopsoas 4 4   KE L2/3/4 Fem Quad 4 4   DF L4/5 D Peron Tib Ant 4+ 4+   PF S1/2 Tibial Grc/Sol 4+ 4+    Reflexes:  Right Left Comments  Pectoralis      Biceps (C5/6) 2 2   Brachioradialis (C5/6) 2 2    Triceps (C6/7) 2 2    Patellar (L3/4) 3 3    Achilles (S1) 2 2    Hoffman      Plantar down down   Jaw jerk    Sensation:  Light touch Intact to light touch throughout   Pin prick    Temperature    Vibration   Proprioception    Coordination/Complex Motor:  - Finger to Nose intact BL - Heel to shin unable to get him to do - Rapid alternating movement are slowed all over. - Gait: Deferred.  Labs   CBC:  Recent Labs  Lab 01/04/20 1313  WBC 3.5*  HGB 14.6  HCT 44.9  MCV 91.6  PLT 95*    Basic Metabolic Panel:  Lab Results  Component Value Date   NA 139 01/04/2020   K 3.8 01/04/2020   CO2 24 01/04/2020   GLUCOSE 169 (H) 01/04/2020   BUN 19 01/04/2020   CREATININE 1.27 (H) 01/04/2020   CALCIUM 9.0 01/04/2020   GFRNONAA >60 01/04/2020   Lipid Panel:  Lab Results  Component Value Date   LDLCALC 98 12/01/2019   HgbA1c:  Lab Results  Component Value Date   HGBA1C 6.7 (H) 11/29/2019   Urine Drug Screen:     Component Value Date/Time   LABOPIA NONE DETECTED 11/29/2019 1209   COCAINSCRNUR NONE DETECTED 11/29/2019 1209   LABBENZ NONE DETECTED 11/29/2019 1209   AMPHETMU NONE DETECTED 11/29/2019 1209   THCU POSITIVE (A) 11/29/2019 1209   LABBARB NONE DETECTED 11/29/2019 1209    Alcohol Level No results found for: [facility]   CT  angio Head and Neck with contrast: pending  MRI Brain  1. Acute left internal capsule infarct. 2. Moderate chronic small vessel ischemic disease  with chronic lacunar infarcts as above.  Impression   Kevin [name] is a 54 y.o. male with PMH significant for DM2, HTN, blind in left eye who presents with encephalopathy and somnolence. Incidentally found to have acute Left internal capsule infarct. His neurologic examination is notable for somnolence, poor attention which precludes further testing of cognition. Has mild R hand grip weakness but no other focal deficit.  Recommendations  Plan:  Left internal capsule infarct:  - Frequent Neuro checks per stroke unit protocol - TTE is pending. - LDL pending - Please start statin if LDL > 70 - HbA1c pending - Antithrombotic - Aspirin 81mg  daily - Recommend DVT ppx - SBP goal - permissive hypertension first 24 h < 220/110. Hold home meds.  - Recommend Telemetry monitoring for arrythmia - Recommend bedside swallow screen prior to PO intake. - Stroke education booklet - Recommend PT/OT/SLP consult - Recommend Urine Tox screen. - I ordered serum EtOh levels, Ammonia levels, Vit B12 with Methylmalonic acid levels, folate, Thiamine levels along with Thiamine 100mg  IV or PO daily, RPR, LFTs.  ______________________________________________________________________   Thank you for the opportunity to take part in the care of this patient. If you have any further questions, please contact the neurology consultation attending.  Signed,  [facility] Pager Number _ _ _   _ __   _ __ _ _  __ __   _ __   __ _

## 2020-01-05 NOTE — ED Notes (Signed)
Report given to [facility] with [facility]. [facility] on the way.

## 2020-01-05 NOTE — Progress Notes (Signed)
  Echocardiogram 2D Echocardiogram has been performed.  Gerda Diss 01/05/2020, 3:38 PM

## 2020-01-05 NOTE — Progress Notes (Addendum)
Addendum note.  Patient seen by hospitalist prior to transfer from [facility].  54 year old with recent diagnosis of month ago for systolic/diastolic heart failure with ejection fraction 20% to left AMA now presents with acute CVA and metabolic encephalopathy.  Waiting for follow-up echocardiogram.  Physical therapy to evaluate.  Will discuss with patient about getting cardiac work-up while here.  Also has acute systolic heart failure with BNP elevated at 450.  Have started IV Lasix.

## 2020-01-05 NOTE — ED Notes (Signed)
Edson Snowball, MD notified that BP 143/76. Per MD, discontinue Cardene drip and give PO meds.

## 2020-01-06 DIAGNOSIS — F17200 Nicotine dependence, unspecified, uncomplicated: Secondary | ICD-10-CM

## 2020-01-06 LAB — COMPREHENSIVE METABOLIC PANEL
ALT: 9 U/L (ref 0–44)
AST: 13 U/L — ABNORMAL LOW (ref 15–41)
Albumin: 3.6 g/dL (ref 3.5–5.0)
Alkaline Phosphatase: 65 U/L (ref 38–126)
Anion gap: 11 (ref 5–15)
BUN: 19 mg/dL (ref 6–20)
CO2: 27 mmol/L (ref 22–32)
Calcium: 9.3 mg/dL (ref 8.9–10.3)
Chloride: 102 mmol/L (ref 98–111)
Creatinine, Ser: 1.55 mg/dL — ABNORMAL HIGH (ref 0.61–1.24)
GFR, Estimated: 53 mL/min — ABNORMAL LOW (ref >60)
Glucose, Bld: 118 mg/dL — ABNORMAL HIGH (ref 70–99)
Potassium: 3.5 mmol/L (ref 3.5–5.1)
Sodium: 140 mmol/L (ref 135–145)
Total Bilirubin: 1.2 mg/dL (ref 0.3–1.2)
Total Protein: 6.5 g/dL (ref 6.5–8.1)

## 2020-01-06 LAB — CBC WITH DIFFERENTIAL/PLATELET
Abs Immature Granulocytes: 0.01 10*3/uL (ref 0.00–0.07)
Basophils Absolute: 0 10*3/uL (ref 0.0–0.1)
Basophils Relative: 1 %
Eosinophils Absolute: 0.1 10*3/uL (ref 0.0–0.5)
Eosinophils Relative: 3 %
HCT: 44.7 % (ref 39.0–52.0)
Hemoglobin: 14.2 g/dL (ref 13.0–17.0)
Immature Granulocytes: 0 %
Lymphocytes Relative: 44 %
Lymphs Abs: 1.8 10*3/uL (ref 0.7–4.0)
MCH: 28.8 pg (ref 26.0–34.0)
MCHC: 31.8 g/dL (ref 30.0–36.0)
MCV: 90.7 fL (ref 80.0–100.0)
Monocytes Absolute: 0.6 10*3/uL (ref 0.1–1.0)
Monocytes Relative: 14 %
Neutro Abs: 1.5 10*3/uL — ABNORMAL LOW (ref 1.7–7.7)
Neutrophils Relative %: 38 %
Platelets: 104 10*3/uL — ABNORMAL LOW (ref 150–400)
RBC: 4.93 MIL/uL (ref 4.22–5.81)
RDW: 13.1 % (ref 11.5–15.5)
WBC: 4 10*3/uL (ref 4.0–10.5)
nRBC: 0 % (ref 0.0–0.2)

## 2020-01-06 LAB — GLUCOSE, CAPILLARY
Glucose-Capillary: 108 mg/dL — ABNORMAL HIGH (ref 70–99)
Glucose-Capillary: 142 mg/dL — ABNORMAL HIGH (ref 70–99)
Glucose-Capillary: 169 mg/dL — ABNORMAL HIGH (ref 70–99)
Glucose-Capillary: 187 mg/dL — ABNORMAL HIGH (ref 70–99)

## 2020-01-06 MED ORDER — SODIUM CHLORIDE 0.9 % IV SOLN: INTRAVENOUS | Status: DC

## 2020-01-06 MED ORDER — INFLUENZA VAC SPLIT QUAD 0.5 ML IM SUSY
0.5000 mL | PREFILLED_SYRINGE | INTRAMUSCULAR | Status: AC
  Administered 2020-01-08: 11:00:00 0.5 mL via INTRAMUSCULAR
  Filled 2020-01-06: qty 0.5

## 2020-01-06 MED ORDER — ASPIRIN EC 81 MG PO TBEC
81.0000 mg | DELAYED_RELEASE_TABLET | Freq: Every day | ORAL | Status: DC
  Administered 2020-01-07 – 2020-01-08 (×2): 81 mg via ORAL
  Filled 2020-01-06 (×2): qty 1

## 2020-01-06 NOTE — H&P (View-Only) (Signed)
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  He states is doing well.  He appears slightly confused and disoriented but is able to move all 4 extremities well against gravity.  Vital signs are stable.  Neuro exam appears unchanged.  2D echo shows slightly improved ejection fraction of 35 to 40% compared to the previous echo of less than 20%.  No definite clot noted.  He denies any history of atrial fibrillation, palpitations or syncopal episodes OBJECTIVE Most recent Vital Signs: Temp: 98.1 F (36.7 C) (12/26 0839) Temp Source: Oral (12/26 0839) BP: 161/111 (12/26 0839) Pulse Rate: 54 (12/26 0839) Respiratory Rate: 18 O2 Saturdation: 100%  CBG (last 3)  Recent Labs    01/05/20 2032 01/06/20 0737 01/06/20 1219  GLUCAP 132* 108* 142*       Studies:  CTHead patchy low-attenuation all bilateral basal ganglia prominent in the genu of the left internal capsule which may represent subacute to chronic ischemic change. LDL MRI Brain : Acute left internal capsule infarct.  Moderate chronic small vessel disease changes.  CTA head and neck negative for large vessel occlusion.  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  ECHO : Left ventricular ejection fraction 35 to 40% with moderately decreased function and global hypokinesis.  No definite clot.  LDL 149 mg percent HbA1c 5.8  Physical Exam:    Pleasant middle-age African-American male not in distress.  . Afebrile. Head is nontraumatic. Neck is supple without bruit.    Cardiac exam no murmur or gallop. Lungs are clear to auscultation. Distal pulses are well felt. Neurological Exam ;  Awake  Alert oriented x 3. Normal speech and language.eye movements full without nystagmus.fundi were not visualized. Vision acuity and fields appear normal. Hearing is normal. Palatal movements are normal. Face symmetric. Tongue midline. Normal strength, tone, reflexes and coordination. Normal sensation.   Mild diminished fine finger movements on the right hand mild right grip weakness and orbits left over right upper extremity.  Gait deferred.  ASSESSMENT Kevin [name] is a 54 y.o. male with subacute right-sided weakness secondary to left brain subcortical infarct likely from small vessel disease but large infarct size would also raise suspicion for embolic stroke.  Vascular risk factors of hypertension hyperlipidemia and smoking    Hospital day # 2  TREATMENT/PLAN Recommend aspirin 81 mg and Plavix 75 mg daily for 3 weeks followed by aspirin alone.  Added statin for elevated lipids.  Check echocardiogram   Patient counseled to quit smoking cigarettes and marijuana and is agreeable.  Aggressive risk factor modification.  Mobilize out of bed.  Discussed with cardiology PA to set up TEE and loop for tomorrow..  Discussed with Dr. Rito Ehrlich  I spent 25 minutes in total face-to-face time with the patient, more than 50% of which was spent in counseling and coordination of care, reviewing test results, reviewing medication and discussing or reviewing the diagnosis of lacunar stroke   , the prognosis and treatment options.   Delia Heady, MD

## 2020-01-06 NOTE — Progress Notes (Signed)
   [facility] has been requested to perform a transesophageal echocardiogram on Kevin [name] for stroke.  After careful review of history and examination, the risks and benefits of transesophageal echocardiogram have been explained including risks of esophageal damage, perforation (1:10,000 risk), bleeding, pharyngeal hematoma as well as other potential complications associated with conscious sedation including aspiration, arrhythmia, respiratory failure and death. Alternatives to treatment were discussed, questions were answered. Patient is not fully oriented and is unable to consent at this time. I called and spoke with his mother [name] [place] and discussed above risk and benefits. She agrees to proceed with procedure and gave her consent. Consent confirmed by Laney Pastor. Consent form signed and placed in paper chart.  Of note, patient's mom would like to be notified of timing of procedure tomorrow.  Corrin Parker, PA-C 01/06/2020 9:41 AM

## 2020-01-06 NOTE — Progress Notes (Signed)
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 Work-up revealed 1.5 cm left internal capsule subcortical acute infarct and moderate right vertebral artery stenosis plus LDL of 149. Patient smokes cigarettes and uses marijuana. Started on aspirin and Plavix. Echocardiogram repeated now notes ejection fraction improved at 35-40% with severe concentric left ventricular hypertrophy and grade 2 diastolic dysfunction. PT and OT recommending home health. Neurology recommending TEE and loop recorder which has been set up with cardiology for tomorrow.  Patient doing okay. He gets easily confused and forgetful. Denies any pain or headache.  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 Likely in the context of embolic disease versus small vessel disease. On aspirin Plavix and statin. Needs better blood pressure control overall. For loop recorder and TEE tomorrow. We will set up home health PT/OT Active Problems: Prediabetic: A1c of 5.8. Diet controlled. Counseling.  Overweight: Patient is criteria BMI greater than 25.    Tobacco dependence/Marijuana abuse: Counseled to quit.   Essential hypertension   Acute on chronic combined systolic and diastolic CHF (congestive heart failure) (HCC): Mildly elevated BNP so started IV Lasix. Had discussion with cardiology and although he did not have work-up completed last time, not felt to be a good candidate for cath given very poor compliance issues. Discussed with cardiology and perhaps the best plan is  to see how he does after discharge. His changes in mentation may be more long-term if he is under the care of his family, they can bring him in to cardiology as outpatient if they wish to pursue.   Code Status: Full code  Family Communication: Left message for mother  Disposition Plan: Potential discharge tomorrow.   Consultants:  Neurology  Cardiology  Procedures:  Echocardiogram done 12/25: Improved ejection fraction from a month ago with 35 to 40% plus grade 2 diastolic dysfunction plus severe left ventricular hypertrophy  Antimicrobials:  None  DVT prophylaxis: SCDs   Objective: Vitals:   01/06/20 1145 01/06/20 1256  BP: (!) 162/97 (!) 165/92  Pulse:  62  Resp: 14 18  Temp:    SpO2: 98% 98%    Intake/Output Summary (Last 24 hours) at 01/06/2020 1420 Last data filed at 01/06/2020 1400 Gross per 24 hour  Intake 940 ml  Output --  Net 940 ml   Filed Weights   01/04/20 1254  Weight: 88.5 kg   Body mass index is 25.04 kg/m.  Exam:   General: Alert and oriented x2, get easily distracted, forgetful  HEENT: Cephalic and atraumatic, mucous membranes are moist  Cardiovascular: Regular rate and rhythm, S1S2, II/VI SEM  Respiratory: Clear to auscultation bilaterally   Abdomen: Soft, NT, ND, +BS   Musculoskeletal: No clubbing or cyanosis or edema   Skin: No skins breaks, tears or lesions  Psychiatry: Appropriate, but a little confused. Distractable  Neuro: R upper ext grip, flex & ext 5-/5 as compared to left side   Data Reviewed: CBC: Recent Labs  Lab 01/04/20 1313 01/06/20 0504  WBC 3.5* 4.0  NEUTROABS  --  1.5*  HGB 14.6 14.2  HCT 44.9 44.7  MCV 91.6 90.7  PLT 95* 104*   Basic Metabolic Panel: Recent Labs  Lab 01/04/20 1313 01/06/20 0504  NA 139 140  K 3.8 3.5  CL 104 102  CO2 24 27  GLUCOSE 169* 118*  BUN 19 19  CREATININE 1.27* 1.55*  CALCIUM 9.0 9.3   GFR: Estimated Creatinine Clearance: 62.6 mL/min (A) (by C-G formula  based on SCr of 1.55 mg/dL (H)). Liver Function Tests: Recent Labs  Lab 01/04/20 1313 01/05/20 0505 01/06/20 0504  AST 15 15 13*  ALT 10 13 9   ALKPHOS 62 77 65  BILITOT 0.9 1.0 1.2  PROT 6.9 7.8 6.5  ALBUMIN 3.9 4.4 3.6   No results for input(s): LIPASE, AMYLASE in the last 168 hours. Recent Labs  Lab 01/05/20 0505  AMMONIA 21   Coagulation Profile: No results for input(s): INR, PROTIME in the last 168 hours. Cardiac Enzymes: No results for input(s): CKTOTAL, CKMB, CKMBINDEX, TROPONINI in the last 168 hours. BNP (last 3 results) No results for input(s): PROBNP in the last 8760 hours. HbA1C: Recent Labs    01/05/20 0335  HGBA1C 5.8*   CBG: Recent Labs  Lab 01/05/20 1109 01/05/20 1648 01/05/20 2032 01/06/20 0737 01/06/20 1219  GLUCAP 100* 166* 132* 108* 142*   Lipid Profile: Recent Labs    01/05/20 0335  CHOL 217*  HDL 53  LDLCALC 149*  TRIG 77  CHOLHDL 4.1   Thyroid Function Tests: No results for input(s): TSH, T4TOTAL, FREET4, T3FREE, THYROIDAB in the last 72 hours. Anemia Panel: Recent Labs    01/05/20 0505  VITAMINB12 392  FOLATE 9.9   Urine analysis:    Component Value Date/Time   COLORURINE STRAW (A) 01/04/2020 1339   APPEARANCEUR CLEAR 01/04/2020 1339   LABSPEC 1.011 01/04/2020 1339   PHURINE 7.0 01/04/2020 1339   GLUCOSEU NEGATIVE 01/04/2020 1339   HGBUR NEGATIVE 01/04/2020 1339   BILIRUBINUR NEGATIVE 01/04/2020 1339   KETONESUR 5 (A) 01/04/2020 1339   PROTEINUR NEGATIVE 01/04/2020 1339   NITRITE NEGATIVE 01/04/2020 1339   LEUKOCYTESUR NEGATIVE 01/04/2020 1339   Sepsis Labs: @LABRCNTIP (procalcitonin:4,lacticidven:4)  ) Recent Results (from the past 240 hour(s))  Resp Panel by RT-PCR (Flu A&B, Covid) Nasopharyngeal Swab     Status: None   Collection Time: 01/04/20  2:11 PM   Specimen: Nasopharyngeal Swab; Nasopharyngeal(NP) swabs in vial transport medium  Result Value Ref Range Status   SARS Coronavirus 2 by RT PCR NEGATIVE  NEGATIVE Final    Comment: (NOTE) SARS-CoV-2 target nucleic acids are NOT DETECTED.  The SARS-CoV-2 RNA is generally detectable in upper respiratory specimens during the acute phase of infection. The lowest concentration of SARS-CoV-2 viral copies this assay can detect is 138 copies/mL. A negative result does not preclude SARS-Cov-2 infection and should not be used as the sole basis for treatment or other patient management decisions. A negative result may occur with  improper specimen collection/handling, submission of specimen other than nasopharyngeal swab, presence of viral mutation(s) within the areas targeted by this assay, and inadequate number of viral copies(<138 copies/mL). A negative result must be combined with clinical observations, patient history, and epidemiological information. The expected result is Negative.  Fact Sheet for Patients:   Fact Sheet for Healthcare Providers:  01/06/20  This test is no t yet approved or cleared by the [employer] FDA and  has been authorized for detection and/or diagnosis of SARS-CoV-2 by FDA under an Emergency Use Authorization (EUA). This EUA will remain  in effect (meaning this test can be used)  for the duration of the COVID-19 declaration under Section 564(b)(1) of the Act, 21 U.S.C.section 360bbb-3(b)(1), unless the authorization is terminated  or revoked sooner.       Influenza A by PCR NEGATIVE NEGATIVE Final   Influenza B by PCR NEGATIVE NEGATIVE Final    Comment: (NOTE) The Xpert Xpress SARS-CoV-2/FLU/RSV plus assay is intended as an aid in the diagnosis of influenza from Nasopharyngeal swab specimens and should not be used as a sole basis for treatment. Nasal washings and aspirates are unacceptable for Xpert Xpress SARS-CoV-2/FLU/RSV testing.  Fact Sheet for Patients: [employer]  Fact Sheet for Healthcare  Providers: [employer]it  This test is not yet approved or cleared by the [place] FDA and has been authorized for detection and/or diagnosis of SARS-CoV-2 by FDA under an Emergency Use Authorization (EUA). This EUA will remain in effect (meaning this test can be used) for the duration of the COVID-19 declaration under Section 564(b)(1) of the Act, 21 U.S.C. section 360bbb-3(b)(1), unless the authorization is terminated or revoked.  Performed at [facility], [address] [place], [place] [address]       Studies: ECHOCARDIOGRAM COMPLETE  Result Date: 01/05/2020    ECHOCARDIOGRAM REPORT   Patient Name:   Kevin [name] Date of Exam: 01/05/2020 Medical Rec #:  [id number]        Height:       74.0 in Accession #:    [id number]       Weight:       195.0 lb Date of Birth:  July 24, 1965        BSA:          2.150 m Patient Age:    54 years         BP:           161/96 mmHg Patient Gender: M                HR:           63 bpm. Exam Location:  Inpatient Procedure: 2D Echo, Cardiac Doppler and Color Doppler Indications:    Stroke  History:        Patient has prior history of Echocardiogram examinations, most                 recent 11/29/2019. Risk Factors:Current Smoker, Hypertension and                 Diabetes. CVA.  Sonographer:    Ross Ludwig RDCS (AE) Referring Phys: [id number] Teddy Spike IMPRESSIONS  1. Left ventricular ejection fraction, by estimation, is 35 to 40%. The left ventricle has moderately decreased function. The left ventricle demonstrates global hypokinesis. There is severe concentric left ventricular hypertrophy. Left ventricular diastolic parameters are consistent with Grade II diastolic dysfunction (pseudonormalization). Elevated left atrial pressure.  2. Right ventricular systolic function is normal. The right ventricular size is normal. Tricuspid regurgitation signal is inadequate for assessing PA pressure.  3. Left atrial size  was mildly dilated.  4. A small pericardial effusion is present. The pericardial effusion is posterior to the left ventricle.  5. The mitral valve is grossly normal. Trivial mitral valve regurgitation. No evidence of mitral stenosis.  6. The aortic valve is tricuspid. Aortic valve regurgitation is not visualized. No aortic stenosis is present.  7. The inferior vena cava is normal in size with greater than 50% respiratory variability, suggesting right atrial pressure of 3 mmHg. Comparison(s): Changes from prior study are noted. EF  has improved to 35-40%. Still with global HK. Severe concentric LVH consistent with history of poorly conrtrolled HTN. FINDINGS  Left Ventricle: Left ventricular ejection fraction, by estimation, is 35 to 40%. The left ventricle has moderately decreased function. The left ventricle demonstrates global hypokinesis. The left ventricular internal cavity size was normal in size. There is severe concentric left ventricular hypertrophy. Left ventricular diastolic parameters are consistent with Grade II diastolic dysfunction (pseudonormalization). Elevated left atrial pressure. Right Ventricle: The right ventricular size is normal. No increase in right ventricular wall thickness. Right ventricular systolic function is normal. Tricuspid regurgitation signal is inadequate for assessing PA pressure. Left Atrium: Left atrial size was mildly dilated. Right Atrium: Right atrial size was normal in size. Pericardium: A small pericardial effusion is present. The pericardial effusion is posterior to the left ventricle. Mitral Valve: The mitral valve is grossly normal. Trivial mitral valve regurgitation. No evidence of mitral valve stenosis. MV peak gradient, 3.8 mmHg. The mean mitral valve gradient is 1.0 mmHg. Tricuspid Valve: The tricuspid valve is grossly normal. Tricuspid valve regurgitation is trivial. No evidence of tricuspid stenosis. Aortic Valve: The aortic valve is tricuspid. Aortic valve  regurgitation is not visualized. No aortic stenosis is present. Aortic valve mean gradient measures 5.0 mmHg. Aortic valve peak gradient measures 9.0 mmHg. Aortic valve area, by VTI measures 3.03 cm. Pulmonic Valve: The pulmonic valve was grossly normal. Pulmonic valve regurgitation is not visualized. No evidence of pulmonic stenosis. Aorta: The aortic root and ascending aorta are structurally normal, with no evidence of dilitation. Venous: The inferior vena cava is normal in size with greater than 50% respiratory variability, suggesting right atrial pressure of 3 mmHg. IAS/Shunts: The atrial septum is grossly normal. Additional Comments: There is a small pleural effusion in the left lateral region.  LEFT VENTRICLE PLAX 2D LVIDd:         5.00 cm      Diastology LVIDs:         4.20 cm      LV e' medial:    3.15 cm/s LV PW:         2.30 cm      LV E/e' medial:  17.6 LV IVS:        2.20 cm      LV e' lateral:   2.68 cm/s LVOT diam:     2.30 cm      LV E/e' lateral: 20.6 LV SV:         70 LV SV Index:   33 LVOT Area:     4.15 cm  LV Volumes (MOD) LV vol d, MOD A2C: 179.0 ml LV vol d, MOD A4C: 155.5 ml LV vol s, MOD A2C: 122.0 ml LV vol s, MOD A4C: 104.0 ml LV SV MOD A2C:     57.0 ml LV SV MOD A4C:     155.5 ml LV SV MOD BP:      55.2 ml RIGHT VENTRICLE             IVC RV Basal diam:  2.80 cm     IVC diam: 1.30 cm RV S prime:     13.50 cm/s TAPSE (M-mode): 2.8 cm LEFT ATRIUM             Index       RIGHT ATRIUM           Index LA diam:        3.50 cm 1.63 cm/m  RA Area:     17.50  cm LA Vol (A2C):   90.6 ml 42.14 ml/m RA Volume:   46.10 ml  21.44 ml/m LA Vol (A4C):   82.9 ml 38.55 ml/m LA Biplane Vol: 90.9 ml 42.28 ml/m  AORTIC VALVE AV Area (Vmax):    2.94 cm AV Area (Vmean):   2.68 cm AV Area (VTI):     3.03 cm AV Vmax:           150.00 cm/s AV Vmean:          101.000 cm/s AV VTI:            0.232 m AV Peak Grad:      9.0 mmHg AV Mean Grad:      5.0 mmHg LVOT Vmax:         106.00 cm/s LVOT Vmean:         65.200 cm/s LVOT VTI:          0.169 m LVOT/AV VTI ratio: 0.73  AORTA Ao Root diam: 3.70 cm Ao Asc diam:  3.70 cm MITRAL VALVE MV Area (PHT): 2.17 cm    SHUNTS MV Peak grad:  3.8 mmHg    Systemic VTI:  0.17 m MV Mean grad:  1.0 mmHg    Systemic Diam: 2.30 cm MV Vmax:       0.98 m/s MV Vmean:      52.2 cm/s MV Decel Time: 349 msec MV E velocity: 55.30 cm/s MV A velocity: 86.50 cm/s MV E/A ratio:  0.64 Lennie OdorWesley O'Neal MD Electronically signed by Lennie OdorWesley O'Neal MD Signature Date/Time: 01/05/2020/4:36:07 PM    Final     Scheduled Meds:   stroke: mapping our early stages of recovery book   Does not apply Once   amLODipine  10 mg Oral Daily   aspirin  300 mg Rectal Daily   Or   aspirin  325 mg Oral Daily   atorvastatin  80 mg Oral Daily   clopidogrel  75 mg Oral Daily   furosemide  20 mg Intravenous BID   [START ON 01/07/2020] influenza vac split quadrivalent PF  0.5 mL Intramuscular Tomorrow-1000   insulin aspart  0-6 Units Subcutaneous TID WC   thiamine injection  100 mg Intravenous Daily   Or   thiamine  100 mg Oral Daily    Continuous Infusions:   LOS: 2 days     Hollice EspySendil K Alexandro Line, MD [facility]   01/06/2020, 2:20 PM

## 2020-01-06 NOTE — Progress Notes (Signed)
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  He states is doing well.  He appears slightly confused and disoriented but is able to move all 4 extremities well against gravity.  Vital signs are stable.  Neuro exam appears unchanged.  2D echo shows slightly improved ejection fraction of 35 to 40% compared to the previous echo of less than 20%.  No definite clot noted.  He denies any history of atrial fibrillation, palpitations or syncopal episodes OBJECTIVE Most recent Vital Signs: Temp: 98.1 F (36.7 C) (12/26 0839) Temp Source: Oral (12/26 0839) BP: 161/111 (12/26 0839) Pulse Rate: 54 (12/26 0839) Respiratory Rate: 18 O2 Saturdation: 100%  CBG (last 3)  Recent Labs    01/05/20 2032 01/06/20 0737 01/06/20 1219  GLUCAP 132* 108* 142*       Studies:  CTHead patchy low-attenuation all bilateral basal ganglia prominent in the genu of the left internal capsule which may represent subacute to chronic ischemic change. LDL MRI Brain : Acute left internal capsule infarct.  Moderate chronic small vessel disease changes.  CTA head and neck negative for large vessel occlusion.  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  ECHO : Left ventricular ejection fraction 35 to 40% with moderately decreased function and global hypokinesis.  No definite clot.  LDL 149 mg percent HbA1c 5.8  Physical Exam:    Pleasant middle-age African-American male not in distress.  . Afebrile. Head is nontraumatic. Neck is supple without bruit.    Cardiac exam no murmur or gallop. Lungs are clear to auscultation. Distal pulses are well felt. Neurological Exam ;  Awake  Alert oriented x 3. Normal speech and language.eye movements full without nystagmus.fundi were not visualized. Vision acuity and fields appear normal. Hearing is normal. Palatal movements are normal. Face symmetric. Tongue midline. Normal strength, tone, reflexes and coordination. Normal sensation.   Mild diminished fine finger movements on the right hand mild right grip weakness and orbits left over right upper extremity.  Gait deferred.  ASSESSMENT Mr. Zyair L Nicklas is a 54 y.o. male with subacute right-sided weakness secondary to left brain subcortical infarct likely from small vessel disease but large infarct size would also raise suspicion for embolic stroke.  Vascular risk factors of hypertension hyperlipidemia and smoking    Hospital day # 2  TREATMENT/PLAN Recommend aspirin 81 mg and Plavix 75 mg daily for 3 weeks followed by aspirin alone.  Added statin for elevated lipids.  Check echocardiogram   Patient counseled to quit smoking cigarettes and marijuana and is agreeable.  Aggressive risk factor modification.  Mobilize out of bed.  Discussed with cardiology PA to set up TEE and loop for tomorrow..  Discussed with Dr. Krishnan  I spent 25 minutes in total face-to-face time with the patient, more than 50% of which was spent in counseling and coordination of care, reviewing test results, reviewing medication and discussing or reviewing the diagnosis of lacunar stroke   , the prognosis and treatment options.   Aundra Pung, MD   

## 2020-01-06 NOTE — Progress Notes (Signed)
Dr. Rito Ehrlich notified patient BP 165/92, HR 62.

## 2020-01-06 NOTE — Evaluation (Signed)
Occupational Therapy Evaluation Patient Details Name: Kevin [name] MRN: [id number] DOB: 30-Aug-1965 Today's Date: 01/06/2020    History of Present Illness 54 year old with recent diagnosis of month ago for systolic/diastolic heart failure with ejection fraction 20% to left AMA now presents with acute Left internal capsule infarct, as well as moderate chronic small vessel disease with chronic lacunar infarcts in bil. basal ganglia.  Also diagnosed with  metabolic encephalopathy. The pt has a history of noncompliance with medications. 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   Clinical Impression   Pt admitted with above. He demonstrates the below listed deficits and will benefit from continued OT to maximize safety and independence with BADLs.  Pt presents to OT with generalized weakness, as well as h/o vision defiicts.  He presents with significant cognitive impairment including deficits with attention, sequencing, awareness, and problem solving.  He currently requires min - max A for ADLs as he is unable to maintain attention to task in order to complete the tasks thoroughly.   He requires min guard assist for functional mobility.  He reports he lives with his parents, no longer works, and mother provides transportation.  He will require 24 hour supervision at discharge due to severity of cognitive deficits.  Will follow acutely       Follow Up Recommendations  Home health OT;Supervision/Assistance - 24 hour;Other (comment) (HHaide and HHRN)    Equipment Recommendations  Tub/shower seat    Recommendations for Other Services       Precautions / Restrictions Precautions Precautions: Fall      Mobility Bed Mobility Overal bed mobility: Needs Assistance Bed Mobility: Supine  to Sit     Supine to sit: Min guard     General bed mobility comments: cues to initiate task and to guide pt to EOB    Transfers Overall transfer level: Needs assistance Equipment used: 1 person hand held assist Transfers: Sit to/from [employer] Sit to Stand: Min guard Stand pivot transfers: Min guard       General transfer comment: min guard assist for safety    Balance Overall balance assessment: (P) Needs assistance Sitting-balance support: No upper extremity supported;Feet supported Sitting balance-Leahy Scale: Fair     Standing balance support: No upper extremity supported;During functional activity Standing balance-Leahy Scale: Fair Standing balance comment: able to maintain static standing with min guard assist                           ADL either performed or assessed with clinical judgement   ADL Overall ADL's : Needs assistance/impaired Eating/Feeding: Set up;Sitting   Grooming: Wash/dry hands;Wash/dry face;Oral care;Minimal assistance;Standing Grooming Details (indicate cue type and reason): Pt requires max cues to sequence brushing teeth.   decreased thoroughness noted when washing face Upper Body Bathing: Maximal assistance;Standing Upper Body Bathing Details (indicate cue type and reason): assist secondary to decreased thoroughness Lower Body Bathing: Maximal assistance;Sit to/from stand Lower Body Bathing Details (indicate cue type and reason): assist due to decreased thoroughness Upper Body Dressing : Standing;Moderate assistance   Lower Body Dressing: Moderate assistance;Sit to/from stand   Toilet Transfer: Min guard;Ambulation;Comfort height toilet   Toileting- Clothing Manipulation and Hygiene: Sit to/from stand;Moderate assistance Toileting - Clothing Manipulation Details (indicate cue type and reason): max cues provided to  attend to task of removing underpants and max cues to sequence task.  Pt incontinent of urine with  poor awareness and required max cues for peri care due to sequencing and attentional deficits     Functional mobility during ADLs: Min guard       Vision Baseline Vision/History: Retinopathy Patient Visual Report: No change from baseline Additional Comments: Pt not formally assessed due to pt's poor attention.  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 eyes.  Per ophthalmology notes from 2019, pt poorly compliant with treatment.  Pt requires min cues to locate items during simple ADL tasks, and was noted with decraesed scanning strategy     Perception Perception Perception Tested?: Yes   Praxis Praxis Praxis tested?: Deficits Deficits: Perseveration Praxis-Other Comments: questionable mild perseveration noted while performing simple bathing    Pertinent Vitals/Pain Pain Assessment: No/denies pain (Simultaneous filing. User may not have seen previous data.)     Hand Dominance Right   Extremity/Trunk Assessment Upper Extremity Assessment Upper Extremity Assessment: Generalized weakness   Lower Extremity Assessment Lower Extremity Assessment: Defer to PT evaluation   Cervical / Trunk Assessment Cervical / Trunk Assessment: Normal   Communication Communication Communication: Other (comment) (minimal interactions, and slow responses)   Cognition Arousal/Alertness: Awake/alert;Lethargic Behavior During Therapy: Flat affect Overall Cognitive Status: Impaired/Different from baseline Area of Impairment: Orientation;Attention;Memory;Following commands;Safety/judgement;Awareness;Problem solving                 Orientation Level: Disoriented to;Time Current Attention Level: Sustained (with cues) Memory: Decreased short-term memory Following Commands: Follows one step commands consistently;Follows one step commands with increased time Safety/Judgement: Decreased awareness of safety;Decreased awareness of  deficits Awareness: Intellectual (able to state everything is different, and he is not normal, but unable to identify what is different) Problem Solving: Slow processing;Decreased initiation;Difficulty sequencing;Requires verbal cues;Requires tactile cues General Comments: Pt is very easily distracted- requires max  cues to attend to task in quiet environment.  He requires max cues (step by step instruction) to sequence through simple self care tasks such as bathing peri area and brushing teeth.   General Comments  BP 176/110    Exercises     Shoulder Instructions      Home Living Family/patient expects to be discharged to:: Private residence Living Arrangements: Parent Available Help at Discharge: Family;Available 24 hours/day Type of Home: House Home Access: Level entry     Home Layout: One level     Bathroom Shower/Tub: Producer, television/film/video: Standard     Home Equipment: None   Additional Comments: Per chart, and pt confirms, pts mother provides transportation      Prior Functioning/Environment Level of Independence: Independent        Comments: Pt reports he is no longer working, but was independent with ADLs.  his mother provides transportation to appointments.        OT Problem List: Decreased activity tolerance;Impaired balance (sitting and/or standing);Impaired vision/perception;Decreased cognition;Decreased safety awareness      OT Treatment/Interventions: Self-care/ADL training;DME and/or AE instruction;Therapeutic activities;Cognitive remediation/compensation;Visual/perceptual remediation/compensation;Patient/family education;Balance training    OT Goals(Current goals can be found in the care plan section) Acute Rehab OT Goals Patient Stated Goal: pt didn't state OT Goal Formulation: With patient Time For Goal Achievement: 01/20/20 Potential to Achieve Goals: Good ADL Goals Pt Will Perform Grooming: with supervision;standing Pt Will Perform  Upper Body Bathing: with supervision;standing;sitting Pt Will Perform Lower Body Bathing: with supervision;sit to/from  stand Pt Will Perform Upper Body Dressing: with supervision;sitting Pt Will Perform Lower Body Dressing: with supervision;sit to/from stand Pt Will Transfer to Toilet: with supervision;ambulating;regular height toilet;bedside commode;grab bars Pt Will Perform Toileting - Clothing Manipulation and hygiene: with supervision;sit to/from stand Additional ADL Goal #1: Pt will sustain attention to simple self care activities x 5 mins with min cues.  OT Frequency: Min 2X/week   Barriers to D/C:            Co-evaluation              AM-PAC OT "6 Clicks" Daily Activity     Outcome Measure Help from another person eating meals?: A Little Help from another person taking care of personal grooming?: A Lot Help from another person toileting, which includes using toliet, bedpan, or urinal?: A Lot Help from another person bathing (including washing, rinsing, drying)?: A Lot Help from another person to put on and taking off regular upper body clothing?: A Lot Help from another person to put on and taking off regular lower body clothing?: A Lot 6 Click Score: 13   End of Session Nurse Communication: Mobility status  Activity Tolerance: Patient tolerated treatment well Patient left: Other (comment) (with PT)  OT Visit Diagnosis: Unsteadiness on feet (R26.81);Cognitive communication deficit (R41.841);Low vision, both eyes (H54.2) Symptoms and signs involving cognitive functions: Cerebral infarction                Time: 7169-6789 OT Time Calculation (min): 34 min Charges:  OT General Charges $OT Visit: 1 Visit OT Evaluation $OT Eval Moderate Complexity: 1 Mod OT Treatments $Self Care/Home Management : 8-22 mins  Eber Jones OTR/L Acute Rehabilitation Services Pager [phone] Office [phone]   Jeani Hawking M 01/06/2020, 10:31 AM

## 2020-01-06 NOTE — Evaluation (Signed)
Physical Therapy Evaluation Patient Details Name: Kevin [name] MRN: [id number] DOB: [birth year] Today's Date: 01/06/2020   History of Present Illness  54 year old with recent diagnosis of month ago for systolic/diastolic heart failure with ejection fraction 20% to left AMA now presents with acute Left internal capsule infarct, as well as moderate chronic small vessel disease with chronic lacunar infarcts in bil. basal ganglia.  Also diagnosed with  metabolic encephalopathy. The pt has a history of noncompliance with medications. 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  Clinical Impression   Pt admitted with above diagnosis. Comes from home where he reports he lives with his parents, no longer works, and mother provides transportation. Presents to PT with generalized weakness, as well as h/o vision defiicts,  with significant cognitive impairment including deficits with attention, sequencing, awareness, and problem solving.  He currently is unable to maintain attention to task in order to complete the tasks thoroughly.   He requires min guard assist for functional mobility.   He will require 24 hour supervision at discharge due to severity of cognitive deficits.  Will follow acutely  Pt currently with functional limitations due to the deficits listed below (see PT Problem List). Pt will benefit from skilled PT to increase their independence and safety with mobility to allow discharge to the venue listed below.       Follow Up Recommendations Home health PT [facility]; [facility] for chronic disease management)    Equipment Recommendations  Other (comment) (will consider cane)    Recommendations for Other Services       Precautions / Restrictions Precautions Precautions: Fall       Mobility  Bed Mobility Overal bed mobility: Needs Assistance Bed Mobility: Supine to Sit     Supine to sit: Min guard     General bed mobility comments: cues to initiate task and to guide pt to EOB    Transfers Overall transfer level: Needs assistance Equipment used: 1 person hand held assist Transfers: Sit to/from Stand;Stand Pivot Transfers Sit to Stand: Min guard Stand pivot transfers: Min guard       General transfer comment: min guard assist for safety  Ambulation/Gait Ambulation/Gait assistance: Min guard (with and without physical contact) Gait Distance (Feet): 120 Feet Assistive device: None Gait Pattern/deviations: Step-through pattern     General Gait Details: Cues to self-monitor for activity tolerance; Cued pt to walk to the nurse's station, and he walked past it; Able to find his room; occasional erratic step width, but no overt loss of balance noted  Stairs            Wheelchair Mobility    Modified Rankin (Stroke Patients Only)       Balance     Sitting balance-Leahy Scale: Fair       Standing balance-Leahy Scale: Fair Standing balance comment: able to maintain static standing with min guard assist, including standing at th esink on L foot with the R hip externally rotated, and his R forefoot resting in his L forefoot                             Pertinent Vitals/Pain Pain Assessment: No/denies pain    Home Living Family/patient expects to be discharged to:: Private residence Living Arrangements: Parent Available Help at Discharge:  Family;Available 24 hours/day Type of Home: House Home Access: Level entry     Home Layout: One level Home Equipment: None Additional Comments: Per chart, and pt confirms, pts mother provides transportation    Prior Function Level of Independence: Independent         Comments: Pt reports he is no longer working, but was independent with ADLs.  his mother provides transportation to  appointments.     Hand Dominance   Dominant Hand: Right    Extremity/Trunk Assessment   Upper Extremity Assessment Upper Extremity Assessment: Defer to OT evaluation    Lower Extremity Assessment Lower Extremity Assessment: Generalized weakness    Cervical / Trunk Assessment Cervical / Trunk Assessment: Normal  Communication   Communication: Other (comment) (minimal interactions, and slow responses)  Cognition Arousal/Alertness: Awake/alert;Lethargic Behavior During Therapy: Flat affect Overall Cognitive Status: Impaired/Different from baseline Area of Impairment: Orientation;Attention;Memory;Following commands;Safety/judgement;Awareness;Problem solving                 Orientation Level: Disoriented to;Time Current Attention Level: Sustained (with cues) Memory: Decreased short-term memory Following Commands: Follows one step commands consistently;Follows one step commands with increased time Safety/Judgement: Decreased awareness of safety;Decreased awareness of deficits Awareness: Intellectual (able to state everything is different, and he is not normal, but unable to identify what is different) Problem Solving: Slow processing;Decreased initiation;Difficulty sequencing;Requires verbal cues;Requires tactile cues General Comments: Pt is very easily distracted- requires max  cues to attend to task in quiet environment.  He requires max cues (step by step instruction) to sequence through simple self care tasks such as bathing peri area and brushing teeth. On the way out of the room, cued to turn L -- he turned right, and did not self-correct      General Comments General comments (skin integrity, edema, etc.):   01/06/20 0939  Orthostatic Lying   BP- Lying (!) 176/110  Orthostatic Sitting  BP- Sitting (!) 160/106  Orthostatic Standing at 0 minutes  BP- Standing at 0 minutes (!) 154/112  Pulse- Standing at 0 minutes 65      Exercises     Assessment/Plan    PT  Assessment Patient needs continued PT services  PT Problem List Decreased strength;Decreased activity tolerance;Decreased balance;Decreased mobility;Decreased coordination;Decreased knowledge of use of DME;Decreased safety awareness;Decreased knowledge of precautions       PT Treatment Interventions DME instruction;Gait training;Stair training;Functional mobility training;Therapeutic activities;Therapeutic exercise;Balance training;Neuromuscular re-education;Cognitive remediation;Patient/family education    PT Goals (Current goals can be found in the Care Plan section)  Acute Rehab PT Goals Patient Stated Goal: pt didn't state PT Goal Formulation: Patient unable to participate in goal setting Time For Goal Achievement: 01/20/20 Potential to Achieve Goals: Good    Frequency Min 4X/week   Barriers to discharge        Co-evaluation               AM-PAC PT "6 Clicks" Mobility  Outcome Measure Help needed turning from your back to your side while in a flat bed without using bedrails?: None Help needed moving from lying on your back to sitting on the side of a flat bed without using bedrails?: None Help needed moving to and from a bed to a chair (including a wheelchair)?: A Little Help needed standing up from a chair using your arms (e.g., wheelchair or bedside chair)?: A Little Help needed to walk in hospital room?: A Little Help needed climbing 3-5 steps with a railing? : A Little 6 Click Score: 20  End of Session Equipment Utilized During Treatment: Gait belt Activity Tolerance: Patient tolerated treatment well Patient left: in chair;with call bell/phone within reach;with chair alarm set Nurse Communication: Mobility status PT Visit Diagnosis: Unsteadiness on feet (R26.81);Other abnormalities of gait and mobility (R26.89);Muscle weakness (generalized) (M62.81)    Time: 0623-7628 PT Time Calculation (min) (ACUTE ONLY): 39 min   Charges:   PT Evaluation $PT Eval  Moderate Complexity: 1 Mod          Van Clines, [place]  Acute Rehabilitation Services Pager [phone] Office [phone]   Levi Aland 01/06/2020, 1:12 PM

## 2020-01-07 ENCOUNTER — Inpatient Hospital Stay (HOSPITAL_COMMUNITY): Payer: Medicaid Other | Admitting: Anesthesiology

## 2020-01-07 ENCOUNTER — Encounter (HOSPITAL_COMMUNITY)
Admission: EM | Disposition: A | Discharge status: Home Health | Payer: Self-pay | Source: Home / Self Care | Attending: Internal Medicine

## 2020-01-07 ENCOUNTER — Inpatient Hospital Stay (HOSPITAL_COMMUNITY): Payer: Medicaid Other

## 2020-01-07 ENCOUNTER — Other Ambulatory Visit: Payer: Self-pay | Admitting: Physician Assistant

## 2020-01-07 DIAGNOSIS — I313 Pericardial effusion (noninflammatory): Secondary | ICD-10-CM

## 2020-01-07 DIAGNOSIS — I63012 Cerebral infarction due to thrombosis of left vertebral artery: Secondary | ICD-10-CM

## 2020-01-07 DIAGNOSIS — I340 Nonrheumatic mitral (valve) insufficiency: Secondary | ICD-10-CM

## 2020-01-07 HISTORY — PX: TEE WITHOUT CARDIOVERSION: SHX5443

## 2020-01-07 HISTORY — PX: BUBBLE STUDY: SHX6837

## 2020-01-07 LAB — CBC
HCT: 42.9 % (ref 39.0–52.0)
Hemoglobin: 14.3 g/dL (ref 13.0–17.0)
MCH: 29.6 pg (ref 26.0–34.0)
MCHC: 33.3 g/dL (ref 30.0–36.0)
MCV: 88.8 fL (ref 80.0–100.0)
Platelets: 102 10*3/uL — ABNORMAL LOW (ref 150–400)
RBC: 4.83 MIL/uL (ref 4.22–5.81)
RDW: 13.1 % (ref 11.5–15.5)
WBC: 3 10*3/uL — ABNORMAL LOW (ref 4.0–10.5)
nRBC: 0 % (ref 0.0–0.2)

## 2020-01-07 LAB — GLUCOSE, CAPILLARY
Glucose-Capillary: 107 mg/dL — ABNORMAL HIGH (ref 70–99)
Glucose-Capillary: 109 mg/dL — ABNORMAL HIGH (ref 70–99)
Glucose-Capillary: 106 mg/dL — ABNORMAL HIGH (ref 70–99)
Glucose-Capillary: 105 mg/dL — ABNORMAL HIGH (ref 70–99)
Glucose-Capillary: 159 mg/dL — ABNORMAL HIGH (ref 70–99)
Glucose-Capillary: 178 mg/dL — ABNORMAL HIGH (ref 70–99)

## 2020-01-07 LAB — BASIC METABOLIC PANEL
Anion gap: 13 (ref 5–15)
BUN: 31 mg/dL — ABNORMAL HIGH (ref 6–20)
CO2: 26 mmol/L (ref 22–32)
Calcium: 9.4 mg/dL (ref 8.9–10.3)
Chloride: 101 mmol/L (ref 98–111)
Creatinine, Ser: 1.81 mg/dL — ABNORMAL HIGH (ref 0.61–1.24)
GFR, Estimated: 44 mL/min — ABNORMAL LOW (ref >60)
Glucose, Bld: 117 mg/dL — ABNORMAL HIGH (ref 70–99)
Potassium: 3.6 mmol/L (ref 3.5–5.1)
Sodium: 140 mmol/L (ref 135–145)

## 2020-01-07 LAB — BRAIN NATRIURETIC PEPTIDE: B Natriuretic Peptide: 46.1 pg/mL (ref 0.0–100.0)

## 2020-01-07 SURGERY — ECHOCARDIOGRAM, TRANSESOPHAGEAL
Anesthesia: Monitor Anesthesia Care

## 2020-01-07 MED ORDER — LISINOPRIL 20 MG PO TABS
20.0000 mg | ORAL_TABLET | Freq: Every day | ORAL | Status: DC
  Administered 2020-01-07 – 2020-01-08 (×2): 20 mg via ORAL
  Filled 2020-01-07 (×2): qty 1

## 2020-01-07 MED ORDER — AMLODIPINE BESYLATE 10 MG PO TABS
10.0000 mg | ORAL_TABLET | Freq: Every day | ORAL | Status: DC
  Administered 2020-01-08: 11:00:00 10 mg via ORAL
  Filled 2020-01-07: qty 1

## 2020-01-07 MED ORDER — LIDOCAINE HCL (CARDIAC) PF 100 MG/5ML IV SOSY
PREFILLED_SYRINGE | INTRAVENOUS | Status: DC | PRN
  Administered 2020-01-07: 80 mg via INTRATRACHEAL

## 2020-01-07 MED ORDER — ONDANSETRON HCL 4 MG/2ML IJ SOLN
INTRAMUSCULAR | Status: DC | PRN
  Administered 2020-01-07: 4 mg via INTRAVENOUS

## 2020-01-07 MED ORDER — PROPOFOL 500 MG/50ML IV EMUL
INTRAVENOUS | Status: DC | PRN
  Administered 2020-01-07 (×2): 20 mg via INTRAVENOUS
  Administered 2020-01-07: 100 ug/kg/min via INTRAVENOUS

## 2020-01-07 MED ORDER — POTASSIUM CHLORIDE CRYS ER 20 MEQ PO TBCR
40.0000 meq | EXTENDED_RELEASE_TABLET | Freq: Once | ORAL | Status: AC
  Administered 2020-01-07: 18:00:00 40 meq via ORAL
  Filled 2020-01-07: qty 2

## 2020-01-07 NOTE — Transfer of Care (Signed)
Immediate Anesthesia Transfer of Care Note  Patient: Kevin [name]  Procedure(s) Performed: TRANSESOPHAGEAL ECHOCARDIOGRAM (TEE) (N/A ) BUBBLE STUDY  Patient Location: Endoscopy Unit  Anesthesia Type:MAC  Level of Consciousness: awake, alert  and sedated  Airway & Oxygen Therapy: Patient connected to nasal cannula oxygen  Post-op Assessment: Post -op Vital signs reviewed and stable  Post vital signs: stable  Last Vitals:  Vitals Value Taken Time  BP    Temp    Pulse    Resp    SpO2      Last Pain:  Vitals:   01/07/20 1131  TempSrc: Temporal  PainSc: 0-No pain         Complications: No complications documented.

## 2020-01-07 NOTE — Progress Notes (Signed)
Physical Therapy Treatment Patient Details Name: Kevin [name] MRN: [id number] DOB: 03/29/1965 Today's Date: 01/07/2020    History of Present Illness 54 year old with recent diagnosis of month ago for systolic/diastolic heart failure with ejection fraction 20% to left AMA now presents with acute Left internal capsule infarct, as well as moderate chronic small vessel disease with chronic lacunar infarcts in bil. basal ganglia.  Also diagnosed with  metabolic encephalopathy. The pt has a history of noncompliance with medications. 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    PT Comments    Pt making steady progress. Pt continues to demonstrate cognitive deficits as well as physical deficits. Recommend supervision at home.    Follow Up Recommendations  Home health PT;Supervision/Assistance - 24 hour (HHAide; HHRN for chronic disease management)     Equipment Recommendations  None recommended by PT    Recommendations for Other Services       Precautions / Restrictions Precautions Precautions: Fall    Mobility  Bed Mobility Overal bed mobility: Needs Assistance Bed Mobility: Supine to Sit     Supine to sit: Supervision     General bed mobility comments: Verbal cues to initiate  Transfers Overall transfer level: Needs assistance Equipment used: None Transfers: Sit to/from Stand Sit to Stand: Min guard         General transfer comment: Assist for safety  Ambulation/Gait Ambulation/Gait assistance: Min guard Gait Distance (Feet): 240 Feet Assistive device: None Gait Pattern/deviations: Step-through pattern;Drifts right/left     General Gait Details: Slightly unsteady but no overt loss of balance   Stairs             Wheelchair Mobility     Modified Rankin (Stroke Patients Only)       Balance Overall balance assessment: Needs assistance Sitting-balance support: No upper extremity supported;Feet supported Sitting balance-Leahy Scale: Good     Standing balance support: No upper extremity supported Standing balance-Leahy Scale: Fair                              Cognition Arousal/Alertness: Awake/alert;Lethargic Behavior During Therapy: Flat affect Overall Cognitive Status: Impaired/Different from baseline Area of Impairment: Orientation;Attention;Memory;Following commands;Safety/judgement;Awareness;Problem solving                 Orientation Level: Disoriented to;Time Current Attention Level: Sustained Memory: Decreased short-term memory Following Commands: Follows one step commands consistently;Follows one step commands with increased time Safety/Judgement: Decreased awareness of safety;Decreased awareness of deficits Awareness: Intellectual Problem Solving: Slow processing;Decreased initiation;Difficulty sequencing;Requires verbal cues;Requires tactile cues        Exercises      General Comments        Pertinent Vitals/Pain Pain Assessment: No/denies pain    Home Living                      Prior Function            PT Goals (current goals can now be found in the care plan section) Acute Rehab PT Goals Patient Stated Goal: pt didn't state Progress towards PT goals: Progressing toward goals    Frequency    Min 4X/week      PT Plan Current plan remains appropriate    Co-evaluation  AM-PAC PT "6 Clicks" Mobility   Outcome Measure  Help needed turning from your back to your side while in a flat bed without using bedrails?: None Help needed moving from lying on your back to sitting on the side of a flat bed without using bedrails?: None Help needed moving to and from a bed to a chair (including a wheelchair)?: A Little Help needed standing  up from a chair using your arms (e.g., wheelchair or bedside chair)?: A Little Help needed to walk in hospital room?: A Little Help needed climbing 3-5 steps with a railing? : A Little 6 Click Score: 20    End of Session Equipment Utilized During Treatment: Gait belt Activity Tolerance: Patient tolerated treatment well Patient left: in chair;with call bell/phone within reach;with chair alarm set Nurse Communication: Mobility status PT Visit Diagnosis: Unsteadiness on feet (R26.81);Other abnormalities of gait and mobility (R26.89);Muscle weakness (generalized) (M62.81)     Time: 8676-1950 PT Time Calculation (min) (ACUTE ONLY): 16 min  Charges:  $Gait Training: 8-22 mins                     [facility] PT Acute Rehabilitation Services Pager [phone] Office [phone]    Angelina Ok [facility] 01/07/2020, 4:43 PM

## 2020-01-07 NOTE — Progress Notes (Signed)
  Echocardiogram Echocardiogram Transesophageal has been performed.  Kevin [name] 01/07/2020, 12:58 PM

## 2020-01-07 NOTE — Progress Notes (Signed)
Dr. Rito Ehrlich notified patient had several runs of idioventricular rhythm on monitor this morning, converting back to SB/SR in the 50-60 BPM.  Patient was sleeping.  BP 147/87.

## 2020-01-07 NOTE — CV Procedure (Signed)
   Transesophageal Echocardiogram  Indications: Stroke  Time out performed  Anesthesia present.  Propofol administered.  Findings:  Left Ventricle: Ejection fraction 20-25%, global hypokinesis  Mitral Valve: Normal valve, no significant mitral vegetation  Aortic Valve: Trileaflet, no regurgitation  Tricuspid Valve: Trivial tricuspid regurgitation  Left Atrium: No thrombus.  Bubble Contrast Study: Negative bubble study.  No shunt.  Impression: No PFO.  Severely reduced left ventricular ejection fraction.  No thrombus identified.  Donato Schultz, MD

## 2020-01-07 NOTE — Progress Notes (Signed)
Patient transferred back to 6E at 1324hrs.

## 2020-01-07 NOTE — Progress Notes (Signed)
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 Work-up revealed 1.5 cm left internal capsule subcortical acute infarct and moderate right vertebral artery stenosis plus LDL of 149. Patient smokes cigarettes and uses marijuana. Started on aspirin and Plavix. Echocardiogram repeated now notes ejection fraction improved at 35-40% with severe concentric left ventricular hypertrophy and grade 2 diastolic dysfunction. PT and OT recommending home health. Neurology recommending TEE and loop recorder which has been set up with cardiology for tomorrow.  Seen before TEE.  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 Likely in the context of embolic disease versus small vessel disease. On aspirin Plavix and statin. Needs better blood pressure control overall.  TEE today notes no evidence of thrombus, no PFO.  Unable to do loop monitor due to lack of insurance.  Discussed with neurology who recommends anticoagulation as preventative.  PT and OT recommending home health with 24-hour supervision.  Discussed with patient's father who can provide this. Active Problems: Prediabetic: A1c of 5.8. Diet controlled. Counseling.  Overweight: Patient is criteria BMI greater than 25.    Tobacco dependence/Marijuana abuse: Counseled to quit.   Essential hypertension: Elevated blood pressure in the context of recent CVA.  Should improve with better diuresis.    Acute on chronic combined systolic and diastolic CHF (congestive  heart failure) (HCC): Mildly elevated BNP so started IV Lasix. Had discussion with cardiology and although he did not have work-up completed last time, not felt to be a good candidate for cath given very poor compliance issues. Discussed with cardiology and perhaps the best plan is to see how he does after discharge. His changes in mentation may be more long-term if he is under the care of his family, they can bring him in to cardiology as outpatient if they wish to pursue.   Code Status: Full code  Family Communication: Updated both mother and father by phone.  Disposition Plan: Anticipate discharge home tomorrow in father's care with home health PT   Consultants:  Neurology  Cardiology  Procedures:  Echocardiogram done 12/25: Improved ejection fraction from a month ago with 35 to 40% plus grade 2 diastolic dysfunction plus severe left ventricular hypertrophy  TEE done 12/27: No evidence of thrombus.  No PFO.  Antimicrobials:  None  DVT prophylaxis: SCDs   Objective: Vitals:   01/07/20 1345 01/07/20 1400  BP: (!) 177/95 (!) 164/96  Pulse: (!) 59 (!) 56  Resp: 15 13  Temp:    SpO2: 97% 96%    Intake/Output Summary (Last 24 hours) at 01/07/2020 1546 Last data filed at 01/07/2020 0441 Gross per 24 hour  Intake 240 ml  Output 700 ml  Net -460 ml   Filed Weights   01/04/20 1254 01/07/20 0431 01/07/20 1131  Weight: 88.5 kg 73.4 kg 73.4 kg   Body mass index is 20.77 kg/m.  Exam:   General: Alert and oriented x2, get easily distracted, forgetful  HEENT: Cephalic and atraumatic, mucous membranes are moist  Cardiovascular: Regular rate and rhythm, S1S2, II/VI SEM  Respiratory: Clear to auscultation bilaterally   Abdomen: Soft,  NT, ND, +BS   Musculoskeletal: No clubbing or cyanosis or edema   Skin: No skins breaks, tears or lesions  Psychiatry: Appropriate, but a little confused. Distractable  Neuro: R upper ext grip, flex & ext 5-/5 as compared to left  side   Data Reviewed: CBC: Recent Labs  Lab 01/04/20 1313 01/06/20 0504 01/07/20 0906  WBC 3.5* 4.0 3.0*  NEUTROABS  --  1.5*  --   HGB 14.6 14.2 14.3  HCT 44.9 44.7 42.9  MCV 91.6 90.7 88.8  PLT 95* 104* 102*   Basic Metabolic Panel: Recent Labs  Lab 01/04/20 1313 01/06/20 0504 01/07/20 0906  NA 139 140 140  K 3.8 3.5 3.6  CL 104 102 101  CO2 24 27 26   GLUCOSE 169* 118* 117*  BUN 19 19 31*  CREATININE 1.27* 1.55* 1.81*  CALCIUM 9.0 9.3 9.4   GFR: Estimated Creatinine Clearance: 47.9 mL/min (A) (by C-G formula based on SCr of 1.81 mg/dL (H)). Liver Function Tests: Recent Labs  Lab 01/04/20 1313 01/05/20 0505 01/06/20 0504  AST 15 15 13*  ALT 10 13 9   ALKPHOS 62 77 65  BILITOT 0.9 1.0 1.2  PROT 6.9 7.8 6.5  ALBUMIN 3.9 4.4 3.6   No results for input(s): LIPASE, AMYLASE in the last 168 hours. Recent Labs  Lab 01/05/20 0505  AMMONIA 21   Coagulation Profile: No results for input(s): INR, PROTIME in the last 168 hours. Cardiac Enzymes: No results for input(s): CKTOTAL, CKMB, CKMBINDEX, TROPONINI in the last 168 hours. BNP (last 3 results) No results for input(s): PROBNP in the last 8760 hours. HbA1C: Recent Labs    01/05/20 0335  HGBA1C 5.8*   CBG: Recent Labs  Lab 01/06/20 1714 01/06/20 2108 01/07/20 0719 01/07/20 1058 01/07/20 1138  GLUCAP 169* 187* 107* 109* 106*   Lipid Profile: Recent Labs    01/05/20 0335  CHOL 217*  HDL 53  LDLCALC 149*  TRIG 77  CHOLHDL 4.1   Thyroid Function Tests: No results for input(s): TSH, T4TOTAL, FREET4, T3FREE, THYROIDAB in the last 72 hours. Anemia Panel: Recent Labs    01/05/20 0505  VITAMINB12 392  FOLATE 9.9   Urine analysis:    Component Value Date/Time   COLORURINE STRAW (A) 01/04/2020 1339   APPEARANCEUR CLEAR 01/04/2020 1339   LABSPEC 1.011 01/04/2020 1339   PHURINE 7.0 01/04/2020 1339   GLUCOSEU NEGATIVE 01/04/2020 1339   HGBUR NEGATIVE 01/04/2020 1339   BILIRUBINUR NEGATIVE  01/04/2020 1339   KETONESUR 5 (A) 01/04/2020 1339   PROTEINUR NEGATIVE 01/04/2020 1339   NITRITE NEGATIVE 01/04/2020 1339   LEUKOCYTESUR NEGATIVE 01/04/2020 1339   Sepsis Labs: @LABRCNTIP (procalcitonin:4,lacticidven:4)  ) Recent Results (from the past 240 hour(s))  Resp Panel by RT-PCR (Flu A&B, Covid) Nasopharyngeal Swab     Status: None   Collection Time: 01/04/20  2:11 PM   Specimen: Nasopharyngeal Swab; Nasopharyngeal(NP) swabs in vial transport medium  Result Value Ref Range Status   SARS Coronavirus 2 by RT PCR NEGATIVE NEGATIVE Final    Comment: (NOTE) SARS-CoV-2 target nucleic acids are NOT DETECTED.  The SARS-CoV-2 RNA is generally detectable in upper respiratory specimens during the acute phase of infection. The lowest concentration of SARS-CoV-2 viral copies this assay can detect is 138 copies/mL. A negative result does not preclude SARS-Cov-2 infection and should not be used as the sole basis for treatment or other patient management decisions. A negative result may occur with  improper specimen collection/handling, submission of specimen other than nasopharyngeal  swab, presence of viral mutation(s) within the areas targeted by this assay, and inadequate number of viral copies(<138 copies/mL). A negative result must be combined with clinical observations, patient history, and epidemiological information. The expected result is Negative.  Fact Sheet for Patients:  BloggerCourse.com  Fact Sheet for Healthcare Providers:  SeriousBroker.it  This test is no t yet approved or cleared by the [place] FDA and  has been authorized for detection and/or diagnosis of SARS-CoV-2 by FDA under an Emergency Use Authorization (EUA). This EUA will remain  in effect (meaning this test can be used) for the duration of the COVID-19 declaration under Section 564(b)(1) of the Act, 21 U.S.C.section 360bbb-3(b)(1), unless the  authorization is terminated  or revoked sooner.       Influenza A by PCR NEGATIVE NEGATIVE Final   Influenza B by PCR NEGATIVE NEGATIVE Final    Comment: (NOTE) The Xpert Xpress SARS-CoV-2/FLU/RSV plus assay is intended as an aid in the diagnosis of influenza from Nasopharyngeal swab specimens and should not be used as a sole basis for treatment. Nasal washings and aspirates are unacceptable for Xpert Xpress SARS-CoV-2/FLU/RSV testing.  Fact Sheet for Patients: BloggerCourse.com  Fact Sheet for Healthcare Providers: SeriousBroker.it  This test is not yet approved or cleared by the [place] FDA and has been authorized for detection and/or diagnosis of SARS-CoV-2 by FDA under an Emergency Use Authorization (EUA). This EUA will remain in effect (meaning this test can be used) for the duration of the COVID-19 declaration under Section 564(b)(1) of the Act, 21 U.S.C. section 360bbb-3(b)(1), unless the authorization is terminated or revoked.  Performed at [facility], [address], [place], [place] [address]       Studies: ECHO TEE  Result Date: 01/07/2020    TRANSESOPHOGEAL ECHO REPORT   Patient Name:   Kevin [name] Date of Exam: 01/07/2020 Medical Rec #:  [id number]        Height:       74.0 in Accession #:    [id number]       Weight:       161.8 lb Date of Birth:  [birth year]        BSA:          1.986 m Patient Age:    54 years         BP:           212/133 mmHg Patient Gender: M                HR:           96 bpm. Exam Location:  Inpatient Procedure: Transesophageal Echo, Color Doppler, Cardiac Doppler and Saline            Contrast Bubble Study Indications:     Stroke  History:         Patient has prior history of Echocardiogram examinations, most                  recent 01/05/2020. Stroke; Risk Factors:Current Smoker,                  Hypertension and Diabetes. Marijuana use.  Sonographer:     Sheralyn Boatman RDCS Referring Phys:  [id number] Corrin Parker Diagnosing Phys: Donato Schultz MD  Sonographer Comments: Technically difficult study due to poor echo windows. PROCEDURE: After discussion of the risks and benefits of a TEE, an informed consent was obtained from a family member. The transesophogeal probe was  passed without difficulty through the esophogus of the patient. Imaged were obtained with the patient in a left lateral decubitus position. Sedation performed by different physician. The patient was monitored while under deep sedation. Anesthestetic sedation was provided intravenously by Anesthesiology: 120mg  of Propofol, 80mg  of Lidocaine. The patient's vital signs; including heart rate, blood pressure, and oxygen saturation; remained stable throughout the procedure. The patient developed no complications during the procedure. IMPRESSIONS  1. Left ventricular ejection fraction, by estimation, is 20 to 25%. The left ventricle has severely decreased function. The left ventricle demonstrates global hypokinesis. The left ventricular internal cavity size was mildly dilated. There is severe left ventricular hypertrophy.  2. Right ventricular systolic function is normal. The right ventricular size is normal.  3. Left atrial size was mildly dilated. No left atrial/left atrial appendage thrombus was detected.  4. A small pericardial effusion is present. The pericardial effusion is posterior to the left ventricle. There is no evidence of cardiac tamponade.  5. The mitral valve is normal in structure. Mild mitral valve regurgitation. No evidence of mitral stenosis.  6. The aortic valve is normal in structure. Aortic valve regurgitation is not visualized. No aortic stenosis is present.  7. Agitated saline contrast bubble study was negative, with no evidence of any interatrial shunt. FINDINGS  Left Ventricle: Left ventricular ejection fraction, by estimation, is 20 to 25%. The left ventricle has severely decreased  function. The left ventricle demonstrates global hypokinesis. The left ventricular internal cavity size was mildly dilated. There is severe left ventricular hypertrophy. Right Ventricle: The right ventricular size is normal. No increase in right ventricular wall thickness. Right ventricular systolic function is normal. Left Atrium: Left atrial size was mildly dilated. No left atrial/left atrial appendage thrombus was detected. Right Atrium: Right atrial size was normal in size. Pericardium: A small pericardial effusion is present. The pericardial effusion is posterior to the left ventricle. There is no evidence of cardiac tamponade. Mitral Valve: The mitral valve is normal in structure. Mild mitral valve regurgitation. No evidence of mitral valve stenosis. Tricuspid Valve: The tricuspid valve is normal in structure. Tricuspid valve regurgitation is trivial. Aortic Valve: The aortic valve is normal in structure. Aortic valve regurgitation is not visualized. No aortic stenosis is present. Pulmonic Valve: The pulmonic valve was normal in structure. Pulmonic valve regurgitation is not visualized. Aorta: The aortic root is normal in size and structure. IAS/Shunts: No atrial level shunt detected by color flow Doppler. Agitated saline contrast was given intravenously to evaluate for intracardiac shunting. Agitated saline contrast bubble study was negative, with no evidence of any interatrial shunt. MD Electronically signed by MD Signature Date/Time: 01/07/2020/3:07:54 PM    Final     Scheduled Meds: .  stroke: mapping our early stages of recovery book   Does not apply Once  . amLODipine  10 mg Oral Daily  . aspirin EC  81 mg Oral Daily  . atorvastatin  80 mg Oral Daily  . clopidogrel  75 mg Oral Daily  . furosemide  20 mg Intravenous BID  . influenza vac split quadrivalent PF  0.5 mL Intramuscular Tomorrow-1000  . insulin aspart  0-6 Units Subcutaneous TID WC  . thiamine injection  100  mg Intravenous Daily   Or  . thiamine  100 mg Oral Daily    Continuous Infusions:   LOS: 3 days     Donato Schultz, MD [facility]   01/07/2020, 3:46 PM

## 2020-01-07 NOTE — Progress Notes (Signed)
PT Cancellation Note  Patient Details Name: Kevin [name] MRN: [id number] DOB: 05-Oct-1965   Cancelled Treatment:    Reason Eval/Treat Not Completed: Patient at procedure or test/unavailable. Pt down for TEE.    Angelina Ok [facility] 01/07/2020, 12:08 PM Skip Mayer PT Acute Rehabilitation Services Pager [phone] Office [phone]

## 2020-01-07 NOTE — Progress Notes (Signed)
OT Cancellation Note  Patient Details Name: Kevin [name] MRN: [id number] DOB: 11/26/1965   Cancelled Treatment:    Reason Eval/Treat Not Completed: Fatigue/lethargy limiting ability to participate;Other (comment) pt asleep upon OTA arrival, pt unable to arouse/ engage with therapist, will check back as time allows for OT session.   Audery Amel., COTA/L Acute Rehabilitation Services [phone]   Angelina Pih 01/07/2020, 8:22 AM

## 2020-01-07 NOTE — Progress Notes (Addendum)
STROKE TEAM PROGRESS NOTE   INTERVAL HISTORY RN and mom are at the bedside. Pt sitting in chair, lethargic and not fully orientated, with psychomotor slowing. Per mom, pt has no insurance, will not able to afford heart monitoring or DOACs. Not a good candidate for coumadin given medication noncompliance and cognitive impairment. Pt current stroke at left internal capsule, more like small vessel disease though.   Vitals:   01/07/20 0721 01/07/20 0839 01/07/20 1059 01/07/20 1131  BP: (!) 147/87 (!) 145/108 (!) 157/107 (!) 189/93  Pulse: (!) 52  (!) 52   Resp: 16 (!) 8 16 13   Temp: 98.9 F (37.2 C)  98.1 F (36.7 C) 98.2 F (36.8 C)  TempSrc: Oral  Oral Temporal  SpO2: 97% 98% 99% 98%  Weight:    73.4 kg  Height:    6\' 2"  (1.88 m)   CBC:  Recent Labs  Lab 01/06/20 0504 01/07/20 0906  WBC 4.0 3.0*  NEUTROABS 1.5*  --   HGB 14.2 14.3  HCT 44.7 42.9  MCV 90.7 88.8  PLT 104* 102*   Basic Metabolic Panel:  Recent Labs  Lab 01/06/20 0504 01/07/20 0906  NA 140 140  K 3.5 3.6  CL 102 101  CO2 27 26  GLUCOSE 118* 117*  BUN 19 31*  CREATININE 1.55* 1.81*  CALCIUM 9.3 9.4   Lipid Panel:  Recent Labs  Lab 01/05/20 0335  CHOL 217*  TRIG 77  HDL 53  CHOLHDL 4.1  VLDL 15  LDLCALC 01/09/20*   HgbA1c:  Recent Labs  Lab 01/05/20 0335  HGBA1C 5.8*   Urine Drug Screen:  Recent Labs  Lab 01/05/20 0131  LABOPIA NONE DETECTED  COCAINSCRNUR NONE DETECTED  LABBENZ NONE DETECTED  AMPHETMU NONE DETECTED  THCU POSITIVE*  LABBARB NONE DETECTED    Alcohol Level  Recent Labs  Lab 01/05/20 0505  ETH <10    IMAGING past 24 hours No results found.  PHYSICAL EXAM   Temp:  [97.8 F (36.6 C)-99.5 F (37.5 C)] 99.5 F (37.5 C) (12/27 1647) Pulse Rate:  [52-97] 63 (12/27 1647) Resp:  [8-30] 20 (12/27 1703) BP: (145-190)/(76-112) 171/103 (12/27 1703) SpO2:  [92 %-100 %] 94 % (12/27 1703) Weight:  [73.4 kg] 73.4 kg (12/27 1131)  General - Well nourished, well developed,  in no apparent distress.  Ophthalmologic - fundi not visualized due to noncooperation.  Cardiovascular - Regular rhythm and rate.  Neuro - awake alert but lethargic, psychomotor slowing, hypophonia. Orientated to year, people and age but not orientated to place and month. Able to name 4/6 and able to repeat. Following all simple commands. Left and right eye visual field full, both can count fingers at least, no significant left eye vision difficulty. No gaze palsy, tracking bilaterally. No significant facial droop. Tongue midline. Moving BUEs at least 4/5, BLEs at least 3/5. Sensation subjectively symmetrical. FTN intact bilaterally although slower on the left. Gait not tested.   ASSESSMENT/PLAN Kevin [name] is a 54 y.o. male with history of DM2, HTN presenting with altered mental status.   Stroke: L internal capsule infarct secondary to small vessel disease source   CT head B basal ganglia hypodensity, most prominent on L. Old R caudate lacune. Small vessel disease. Sinus dz.   MRI  L internal capsule infarct. Small vessel disease. Chronic lacunes.   CT head evolution L internal capsule infarct.   CTA head & neck no LVO. Short severe distal R V4 stenosis. Moderate atherosclerosis through  brain and neck.  2D Echo EF 35-40%. No source of embolus. LA mildly dilated.  TEE EF 20 to 25% no PFO.   EKG 12/24, 12/25 neg AF  LDL 149  HgbA1c 5.8  VTE prophylaxis - SCDs   No antithrombotic prior to admission, now on aspirin 81 mg daily and clopidogrel 75 mg daily. contniue DAPT x 3 weeks then aspirin 325 alone.   Therapy recommendations:  HH PT, OT  Disposition:  pending  Cardiomyopathy  11/2019 EF < 20%  This admission TTE EF 35 to 40%, however TEE EF 20 to 25%  Not a good candidate for anticoagulation - no insurance for DOAC, medication noncompliance for coumadin  No insurance for loop recorder or 30 day Cardiac event monitoring  Close follow up with cardiology and  neurology as outpt - consider above work up and therapy once financial support available  On Lasix  Hypertension  BP now Stable . Gradually normalize BP in 2-3 days . Resume norvasc and lisinopril . On Lasix . Long-term BP goal normotensive  Hyperlipidemia  Home meds:  Lipitor 10  Now on Lipitor 80  LDL 149, goal < 70  Continue statin at discharge  Type II Diabetes, Controlled  HgbA1c 5.8, goal < 7.0  CBGs  SSI  Close PCP follow-up  Tobacco abuse  Current smoker  Smoking cessation counseling provided  Nicotine patch provided  Pt is willing to quit  Other Stroke Risk Factors  ETOH use, alcohol level <10, advised to drink no more than 2 drink(s) a day  Substance abuse - UDS:  THC POSITIVE. Patient advised to stop using due to stroke risk.  Other Active Problems  Recent admission 11/2019 w/ hypertensive emergency, CHF exacerbation, elevated trop. Left [facility]  Hospital day # 3  I had long discussion with mom at bedside, updated pt current condition, treatment plan and potential prognosis, and answered all the questions. She expressed understanding and appreciation. I spent  35 minutes in total face-to-face time with the patient, more than 50% of which was spent in counseling and coordination of care, reviewing test results, images and medication, and discussing the diagnosis, treatment plan and potential prognosis. This patient's care requiresreview of multiple databases, neurological assessment, discussion with family, other specialists and medical decision making of high complexity.  Marvel Plan, MD PhD Stroke Neurology 01/07/2020 6:59 PM    To contact Stroke Continuity provider, please refer to [email or web address]. After hours, contact General Neurology

## 2020-01-07 NOTE — Progress Notes (Signed)
Patient taken in bed for TEE.

## 2020-01-07 NOTE — Progress Notes (Addendum)
Monitor tech notified of ST depression in AVL and AVF.  Patient sleeping, awakened and denies pain but very drowsy.  BP 145/108. EKG done and Dr. Rito Ehrlich notified.

## 2020-01-07 NOTE — Anesthesia Preprocedure Evaluation (Signed)
Anesthesia Evaluation  Patient identified by MRN, date of birth, ID band Patient awake    Reviewed: Allergy & Precautions, H&P , NPO status , Patient's Chart, lab work & pertinent test results  Airway Mallampati: II  TM Distance: >3 FB Neck ROM: Full    Dental no notable dental hx.    Pulmonary Current Smoker,  Current smoker, 80 pack year history    Pulmonary exam normal breath sounds clear to auscultation       Cardiovascular hypertension, Pt. on medications +CHF (LVEF 35-40%)  Normal cardiovascular exam Rhythm:Regular Rate:Normal  Echo 01/05/20: 1. Left ventricular ejection fraction, by estimation, is 35 to 40%. The  left ventricle has moderately decreased function. The left ventricle  demonstrates global hypokinesis. There is severe concentric left  ventricular hypertrophy. Left ventricular  diastolic parameters are consistent with Grade II diastolic dysfunction  (pseudonormalization). Elevated left atrial pressure.  2. Right ventricular systolic function is normal. The right ventricular  size is normal. Tricuspid regurgitation signal is inadequate for assessing  PA pressure.  3. Left atrial size was mildly dilated.  4. A small pericardial effusion is present. The pericardial effusion is  posterior to the left ventricle.  5. The mitral valve is grossly normal. Trivial mitral valve  regurgitation. No evidence of mitral stenosis.  6. The aortic valve is tricuspid. Aortic valve regurgitation is not  visualized. No aortic stenosis is present.  7. The inferior vena cava is normal in size with greater than 50%  respiratory variability, suggesting right atrial pressure of 3 mmHg.    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 Work-up  revealed 1.5 cm left internal capsule subcortical acute infarct and moderate right vertebral artery stenosis plus LDL of 149. CVA (confusion, forgetfullness), Residual Symptoms negative psych ROS   GI/Hepatic negative GI ROS, (+)     substance abuse  marijuana use,   Endo/Other  diabetes, Well Controlled, Type 2, Oral Hypoglycemic Agents, Insulin Dependenta1c 5.8  Renal/GU Renal InsufficiencyRenal diseaseCr 1.81 from 1.55  negative genitourinary   Musculoskeletal negative musculoskeletal ROS (+)   Abdominal   Peds negative pediatric ROS (+)  Hematology negative hematology ROS (+)   Anesthesia Other Findings   Reproductive/Obstetrics negative OB ROS                             Anesthesia Physical Anesthesia Plan  ASA: III  Anesthesia Plan: MAC   Post-op Pain Management:    Induction:   PONV Risk Score and Plan: 2 and Propofol infusion and TIVA  Airway Management Planned: Natural Airway and Simple Face Mask  Additional Equipment: None  Intra-op Plan:   Post-operative Plan:   Informed Consent: I have reviewed the patients History and Physical, chart, labs and discussed the procedure including the risks, benefits and alternatives for the proposed anesthesia with the patient or authorized representative who has indicated his/her understanding and acceptance.       Plan Discussed with: CRNA  Anesthesia Plan Comments:         Anesthesia Quick Evaluation

## 2020-01-07 NOTE — Anesthesia Postprocedure Evaluation (Signed)
Anesthesia Post Note  Patient: KANON NOVOSEL  Procedure(s) Performed: TRANSESOPHAGEAL ECHOCARDIOGRAM (TEE) (N/A ) BUBBLE STUDY     Patient location during evaluation: PACU Anesthesia Type: MAC Level of consciousness: awake and alert Pain management: pain level controlled Vital Signs Assessment: post-procedure vital signs reviewed and stable Respiratory status: spontaneous breathing, nonlabored ventilation and respiratory function stable Cardiovascular status: blood pressure returned to baseline and stable Postop Assessment: no apparent nausea or vomiting Anesthetic complications: no   No complications documented.  Last Vitals:  Vitals:   01/07/20 1345 01/07/20 1400  BP: (!) 177/95 (!) 164/96  Pulse: (!) 59 (!) 56  Resp: 15 13  Temp:    SpO2: 97% 96%    Last Pain:  Vitals:   01/07/20 1324  TempSrc: Oral  PainSc:                  Pervis Hocking

## 2020-01-07 NOTE — Interval H&P Note (Signed)
History and Physical Interval Note:  01/07/2020 12:10 PM  Kevin [name]  has presented today for surgery, with the diagnosis of stroke.  The various methods of treatment have been discussed with the patient and family. After consideration of risks, benefits and other options for treatment, the patient has consented to  Procedure(s): TRANSESOPHAGEAL ECHOCARDIOGRAM (TEE) (N/A) as a surgical intervention.  The patient's history has been reviewed, patient examined, no change in status, stable for surgery.  I have reviewed the patient's chart and labs.  Questions were answered to the patient's satisfaction.     [employer]

## 2020-01-08 ENCOUNTER — Encounter (HOSPITAL_COMMUNITY): Payer: Self-pay | Admitting: Cardiology

## 2020-01-08 DIAGNOSIS — N183 Chronic kidney disease, stage 3 unspecified: Secondary | ICD-10-CM | POA: Diagnosis present

## 2020-01-08 DIAGNOSIS — N1832 Chronic kidney disease, stage 3b: Secondary | ICD-10-CM

## 2020-01-08 DIAGNOSIS — I63312 Cerebral infarction due to thrombosis of left middle cerebral artery: Secondary | ICD-10-CM

## 2020-01-08 LAB — BASIC METABOLIC PANEL
Anion gap: 12 (ref 5–15)
BUN: 36 mg/dL — ABNORMAL HIGH (ref 6–20)
CO2: 26 mmol/L (ref 22–32)
Calcium: 9.4 mg/dL (ref 8.9–10.3)
Chloride: 103 mmol/L (ref 98–111)
Creatinine, Ser: 1.92 mg/dL — ABNORMAL HIGH (ref 0.61–1.24)
GFR, Estimated: 41 mL/min — ABNORMAL LOW (ref >60)
Glucose, Bld: 108 mg/dL — ABNORMAL HIGH (ref 70–99)
Potassium: 3.6 mmol/L (ref 3.5–5.1)
Sodium: 141 mmol/L (ref 135–145)

## 2020-01-08 LAB — CBC
HCT: 43 % (ref 39.0–52.0)
Hemoglobin: 14.3 g/dL (ref 13.0–17.0)
MCH: 29.6 pg (ref 26.0–34.0)
MCHC: 33.3 g/dL (ref 30.0–36.0)
MCV: 89 fL (ref 80.0–100.0)
Platelets: 102 10*3/uL — ABNORMAL LOW (ref 150–400)
RBC: 4.83 MIL/uL (ref 4.22–5.81)
RDW: 13.1 % (ref 11.5–15.5)
WBC: 4 10*3/uL (ref 4.0–10.5)
nRBC: 0 % (ref 0.0–0.2)

## 2020-01-08 LAB — GLUCOSE, CAPILLARY
Glucose-Capillary: 117 mg/dL — ABNORMAL HIGH (ref 70–99)
Glucose-Capillary: 205 mg/dL — ABNORMAL HIGH (ref 70–99)

## 2020-01-08 LAB — MAGNESIUM: Magnesium: 1.9 mg/dL (ref 1.7–2.4)

## 2020-01-08 MED ORDER — ATORVASTATIN CALCIUM 80 MG PO TABS: 80.0000 mg | ORAL_TABLET | Freq: Every day | ORAL | 1 refills | Status: DC

## 2020-01-08 MED ORDER — APIXABAN 5 MG PO TABS: 5.0000 mg | ORAL_TABLET | Freq: Two times a day (BID) | ORAL | 1 refills | Status: AC

## 2020-01-08 MED ORDER — CLOPIDOGREL BISULFATE 75 MG PO TABS: 75.0000 mg | ORAL_TABLET | Freq: Every day | ORAL | 0 refills | Status: DC

## 2020-01-08 MED ORDER — ASPIRIN 81 MG PO TBEC: 81.0000 mg | DELAYED_RELEASE_TABLET | Freq: Every day | ORAL | 11 refills | Status: DC

## 2020-01-08 NOTE — TOC Transition Note (Addendum)
Transition of Care (TOC) - CM/SW Discharge Note Donn Pierini RN, BSN Transitions of Care Unit 4E- RN Case Manager See Treatment Team for direct phone # Cross coverage for 6E   Patient Details  Name: Kevin [name] MRN: [id number] Date of Birth: 10-25-1965  Transition of Care [facility]) CM/SW Contact:  Darrold Span, RN Phone Number: 01/08/2020, 12:42 PM   Clinical Narrative:    Pt stable for transition home today, orders placed for DME and HH needs- pt will be going to live with his parents on discharge. Remains confused, not fully oriented to discuss substance use.  Noted plans to start Eliquis- meds sent to [facility]- will provide 30 day free card to use on discharge. Pt will need to apply for pt assistance for ongoing Eliquis needs.  As pt is un-insured- referral for [facility] needs called to [facility] agency this week- [facility]- they will follow up and assess if pt is eligible for the services- RN/PT/OT. - Amy will reach out to parents.  For DME need- tub bench- call made to [employer] for referral- this is an out of pocket cost- [employer] to reach out to parents to see if they want to purchase this.   CM spoke with dad at the bedside regarding transition needs, discussed DME, HH needs and charity care with [facility] if eligible- dad states he has not been contacted by [facility] but is not sure if mom has or not. Also discussed Eliquis needs- 30 day card provided to dad to use on discharge at [facility], and also the application for patient assistance.  Discussed importance for pt to f/u with MD for ongoing Eliquis needs and compliance with meds and follow ups.    1320- notified by [facility] that they are unable to accept [facility] referral due to staffing- they will check with other [facility] agencies to see if one can accept referral and update this Clinical research associate.    Final next level of care: Home w Home Health Services Barriers to Discharge: No Barriers Identified   Patient Goals and CMS Choice Patient  states their goals for this hospitalization and ongoing recovery are:: return home CMS Medicare.gov Compare Post Acute Care list provided to:: Patient Represenative (must comment) Choice offered to / list presented to : NA (charity referral)  Discharge Placement               Home with [facility] (if eligible for services).         Discharge Plan and Services In-house Referral: Clinical Social Work Discharge Planning Services: CM Consult Post Acute Care Choice: Durable Medical Equipment,Home Health          DME Arranged: Tub bench DME Agency: [facility] Date DME Agency Contacted: 01/08/20 Time DME Agency Contacted: 1000 Representative spoke with at DME Agency: Silvio Pate HH Arranged: RN,PT,OT,Nurse's Aide HH Agency: [facility] Date [facility] Agency Contacted: 01/08/20 Time HH Agency Contacted: 1020 Representative spoke with at [facility] Agency: Amy  Social Determinants of Health (SDOH) Interventions     Readmission Risk Interventions Readmission Risk Prevention Plan 01/08/2020  Transportation Screening Complete  PCP or Specialist Appt within 5-7 Days Complete  Home Care Screening Complete  Medication Review (RN CM) Complete  Some recent data might be hidden

## 2020-01-08 NOTE — Progress Notes (Signed)
STROKE TEAM PROGRESS NOTE   INTERVAL HISTORY Cardiology NP is at the bedside. Pt lying in bed, lethargic, not fully orientated, neuro largely unchanged. Discussed with pharmacy and seems that pt can get into low income support from company so that his copay for DOAC can be 0. Discussed with cardiology NP and Dr. Rito Ehrlich, will start eliquis on discharge.   Vitals:   01/08/20 0442 01/08/20 0759 01/08/20 1038 01/08/20 1142  BP:  (!) 149/91 122/86 136/77  Pulse:  (!) 54    Resp:  14  15  Temp:  (!) 97.1 F (36.2 C)  97.7 F (36.5 C)  TempSrc:  Oral  Oral  SpO2:    99%  Weight: 73.2 kg     Height:       CBC:  Recent Labs  Lab 01/06/20 0504 01/07/20 0906 01/08/20 0313  WBC 4.0 3.0* 4.0  NEUTROABS 1.5*  --   --   HGB 14.2 14.3 14.3  HCT 44.7 42.9 43.0  MCV 90.7 88.8 89.0  PLT 104* 102* 102*   Basic Metabolic Panel:  Recent Labs  Lab 01/07/20 0906 01/08/20 0313  NA 140 141  K 3.6 3.6  CL 101 103  CO2 26 26  GLUCOSE 117* 108*  BUN 31* 36*  CREATININE 1.81* 1.92*  CALCIUM 9.4 9.4  MG  --  1.9   Lipid Panel:  Recent Labs  Lab 01/05/20 0335  CHOL 217*  TRIG 77  HDL 53  CHOLHDL 4.1  VLDL 15  LDLCALC 185*   HgbA1c:  Recent Labs  Lab 01/05/20 0335  HGBA1C 5.8*   Urine Drug Screen:  Recent Labs  Lab 01/05/20 0131  LABOPIA NONE DETECTED  COCAINSCRNUR NONE DETECTED  LABBENZ NONE DETECTED  AMPHETMU NONE DETECTED  THCU POSITIVE*  LABBARB NONE DETECTED    Alcohol Level  Recent Labs  Lab 01/05/20 0505  ETH <10    IMAGING past 24 hours ECHO TEE  Result Date: 01/07/2020    TRANSESOPHOGEAL ECHO REPORT   Patient Name:   Kevin [name] Date of Exam: 01/07/2020 Medical Rec #:  [id number]        Height:       74.0 in Accession #:    [id number]       Weight:       161.8 lb Date of Birth:  03-Mar-1965        BSA:          1.986 m Patient Age:    54 years         BP:           212/133 mmHg Patient Gender: M                HR:           96 bpm. Exam Location:   Inpatient Procedure: Transesophageal Echo, Color Doppler, Cardiac Doppler and Saline            Contrast Bubble Study Indications:     Stroke  History:         Patient has prior history of Echocardiogram examinations, most                  recent 01/05/2020. Stroke; Risk Factors:Current Smoker,                  Hypertension and Diabetes. Marijuana use.  Sonographer:     Sheralyn Boatman RDCS Referring Phys:  [id number] Corrin Parker Diagnosing Phys: Donato Schultz MD  Sonographer Comments: Technically difficult study due to poor echo windows. PROCEDURE: After discussion of the risks and benefits of a TEE, an informed consent was obtained from a family member. The transesophogeal probe was passed without difficulty through the esophogus of the patient. Imaged were obtained with the patient in a left lateral decubitus position. Sedation performed by different physician. The patient was monitored while under deep sedation. Anesthestetic sedation was provided intravenously by Anesthesiology: 120mg  of Propofol, 80mg  of Lidocaine. The patient's vital signs; including heart rate, blood pressure, and oxygen saturation; remained stable throughout the procedure. The patient developed no complications during the procedure. IMPRESSIONS  1. Left ventricular ejection fraction, by estimation, is 20 to 25%. The left ventricle has severely decreased function. The left ventricle demonstrates global hypokinesis. The left ventricular internal cavity size was mildly dilated. There is severe left ventricular hypertrophy.  2. Right ventricular systolic function is normal. The right ventricular size is normal.  3. Left atrial size was mildly dilated. No left atrial/left atrial appendage thrombus was detected.  4. A small pericardial effusion is present. The pericardial effusion is posterior to the left ventricle. There is no evidence of cardiac tamponade.  5. The mitral valve is normal in structure. Mild mitral valve regurgitation. No evidence of  mitral stenosis.  6. The aortic valve is normal in structure. Aortic valve regurgitation is not visualized. No aortic stenosis is present.  7. Agitated saline contrast bubble study was negative, with no evidence of any interatrial shunt. FINDINGS  Left Ventricle: Left ventricular ejection fraction, by estimation, is 20 to 25%. The left ventricle has severely decreased function. The left ventricle demonstrates global hypokinesis. The left ventricular internal cavity size was mildly dilated. There is severe left ventricular hypertrophy. Right Ventricle: The right ventricular size is normal. No increase in right ventricular wall thickness. Right ventricular systolic function is normal. Left Atrium: Left atrial size was mildly dilated. No left atrial/left atrial appendage thrombus was detected. Right Atrium: Right atrial size was normal in size. Pericardium: A small pericardial effusion is present. The pericardial effusion is posterior to the left ventricle. There is no evidence of cardiac tamponade. Mitral Valve: The mitral valve is normal in structure. Mild mitral valve regurgitation. No evidence of mitral valve stenosis. Tricuspid Valve: The tricuspid valve is normal in structure. Tricuspid valve regurgitation is trivial. Aortic Valve: The aortic valve is normal in structure. Aortic valve regurgitation is not visualized. No aortic stenosis is present. Pulmonic Valve: The pulmonic valve was normal in structure. Pulmonic valve regurgitation is not visualized. Aorta: The aortic root is normal in size and structure. IAS/Shunts: No atrial level shunt detected by color flow Doppler. Agitated saline contrast was given intravenously to evaluate for intracardiac shunting. Agitated saline contrast bubble study was negative, with no evidence of any interatrial shunt. MD Electronically signed by MD Signature Date/Time: 01/07/2020/3:07:54 PM    Final     PHYSICAL EXAM   Temp:  [97.1 F (36.2 C)-99.5  F (37.5 C)] 97.7 F (36.5 C) (12/28 1142) Pulse Rate:  [54-97] 54 (12/28 0759) Resp:  [11-30] 15 (12/28 1142) BP: (122-190)/(77-112) 136/77 (12/28 1142) SpO2:  [92 %-100 %] 99 % (12/28 1142) Weight:  [73.2 kg] 73.2 kg (12/28 0442)  General - Well nourished, well developed, lethargic.  Ophthalmologic - fundi not visualized due to noncooperation.  Cardiovascular - Regular rhythm and rate.  Neuro - initially sleepy but easily arousable and follows simple commands. Still has psychomotor slowing, hypophonia. Orientated to self  and people, but not to age, place and time, stated Feb 2019. Able to name 3/5 and able to repeat. Following all simple commands. Visual field full. No gaze palsy, tracking bilaterally. Mild right facial droop. Tongue midline. RUE at least 4/5 with no drift, LUE at least 3/5 with mild drift, BLEs at least 3/5. Sensation subjectively symmetrical. FTN intact bilaterally although slower on the left. Gait not tested.   ASSESSMENT/PLAN Kevin [name] is a 54 y.o. male with history of DM2, HTN, blind in left eye presenting with altered mental status.   Stroke: L internal capsule infarct secondary to small vessel disease source, however, cardioembolic source can not be ruled out given low EF   CT head B basal ganglia hypodensity, most prominent on L. Old R caudate lacune. Small vessel disease. Sinus dz.   MRI  L internal capsule infarct. Small vessel disease. Chronic lacunes.   CT head evolution L internal capsule infarct.   CTA head & neck no LVO. Short severe distal R V4 stenosis. Moderate atherosclerosis through brain and neck.  2D Echo EF 35-40%. No source of embolus. LA mildly dilated.  TEE EF 20 to 25% no PFO.   EKG 12/24, 12/25 neg AF  LDL 149  HgbA1c 5.8  VTE prophylaxis - SCDs   No antithrombotic prior to admission, now on aspirin 81 mg daily and clopidogrel 75 mg daily. Given low EF and stroke, recommend eliquis 5mg  bid. Once EF > 30-35%, eliquis  can be discontinued and switch back to antiplatelet.   Therapy recommendations:  HH PT, OT  Disposition:  pending  Cardiomyopathy  11/2019 EF < 20%  This admission TTE EF 35 to 40%, however TEE EF 20 to 25%  Given low EF and stroke, recommend eliquis 5mg  bid. Once EF > 30-35%, eliquis can be discontinued and switch back to antiplatelet.  No need to have heart monitoring at this time given on anticoagulation. May consider heart monitoring once off eliquis and insurance able to cover  Close follow up with cardiology and neurology as outpt - consider above work up and therapy once financial support available  On Lasix  Hypertension  BP now Stable . Gradually normalize BP in 2-3 days . Resume norvasc and lisinopril . On Lasix . Long-term BP goal normotensive  Hyperlipidemia  Home meds:  Lipitor 10  Now on Lipitor 80  LDL 149, goal < 70  Continue statin at discharge  Type II Diabetes, Controlled  HgbA1c 5.8, goal < 7.0  CBGs  SSI  Close PCP follow-up  Tobacco abuse  Current smoker  Smoking cessation counseling provided  Nicotine patch provided  Pt is willing to quit  Other Stroke Risk Factors  ETOH use, alcohol level <10, advised to drink no more than 2 drink(s) a day  Substance abuse - UDS:  THC POSITIVE. Patient advised to stop using due to stroke risk.  Other Active Problems  Recent admission 11/2019 w/ hypertensive emergency, CHF exacerbation, elevated trop. Left [facility]  Hospital day # 4  Neurology will sign off. Please call with questions. Pt will follow up with stroke clinic Dr. 12/2019 at [facility] in about 3-4 weeks. Thanks for the consult.  Pearlean Brownie, MD PhD Stroke Neurology 01/08/2020 12:17 PM    To contact Stroke Continuity provider, please refer to [facility] Plan. After hours, contact General Neurology

## 2020-01-08 NOTE — TOC Progression Note (Signed)
Transition of Care [facility]) - Progression Note    Patient Details  Name: Kevin [name] MRN: [id number] Date of Birth: [birth year]  [facility] CM/SW Contact  Carley Hammed, [place] Phone Number: 01/08/2020, 1:09 PM  Clinical Narrative:    CSW was contacted by pt's mother who was asking about Medicaid and disability for her son, due to his lack of insurance. CSW mad a referral to [place] with Financial counseling.   Expected Discharge Plan: Home w Home Health Services Barriers to Discharge: No Barriers Identified  Expected Discharge Plan and Services Expected Discharge Plan: Home w Home Health Services In-house Referral: Clinical Social Work Discharge Planning Services: CM Consult Post Acute Care Choice: Durable Medical Equipment,Home Health Living arrangements for the past 2 months: Single Family Home Expected Discharge Date: 01/08/20               DME Arranged: Tub bench DME Agency: [employer] Date DME Agency Contacted: 01/08/20 Time DME Agency Contacted: 1000 Representative spoke with at DME Agency: Silvio Pate HH Arranged: RN,PT,OT,Nurse's Aide HH Agency: [facility] Date [facility] Agency Contacted: 01/08/20 Time HH Agency Contacted: 1020 Representative spoke with at [facility] Agency: Amy   Social Determinants of Health (SDOH) Interventions    Readmission Risk Interventions Readmission Risk Prevention Plan 01/08/2020  Transportation Screening Complete  PCP or Specialist Appt within 5-7 Days Complete  Home Care Screening Complete  Medication Review (RN CM) Complete  Some recent data might be hidden

## 2020-01-08 NOTE — Evaluation (Signed)
Speech Language Pathology Evaluation Patient Details Name: BARUCH LEWERS MRN: [id number] DOB: 1965-03-11 Today's Date: 01/08/2020 Time: 6160-7371 SLP Time Calculation (min) (ACUTE ONLY): 22 min  Problem List:  Patient Active Problem List   Diagnosis Date Noted  . CKD (chronic kidney disease), stage III (HCC) 01/08/2020  . Essential hypertension 01/05/2020  . Acute on chronic combined systolic and diastolic CHF (congestive heart failure) (HCC) 01/05/2020  . History of noncompliance with medical treatment 01/05/2020  . CVA (cerebral vascular accident) (HCC) 01/04/2020  . Stroke (HCC) 01/04/2020  . Hypertensive emergency 11/29/2019  . Tobacco dependence 11/29/2019  . Marijuana abuse 11/29/2019  . Renal dysfunction 11/29/2019  . Hypokalemia 11/29/2019  . Diabetes mellitus without complication [facility])    Past Medical History:  Past Medical History:  Diagnosis Date  . Blind left eye   . Diabetes mellitus without complication (HCC)   . Hypertension    Past Surgical History:  Past Surgical History:  Procedure Laterality Date  . BUBBLE STUDY  01/07/2020   Procedure: BUBBLE STUDY;  Surgeon: Jake Bathe, MD;  Location: [facility] ENDOSCOPY;  Service: Cardiovascular;;  . GYNECOMASTIA EXCISION    . TEE WITHOUT CARDIOVERSION N/A 01/07/2020   Procedure: TRANSESOPHAGEAL ECHOCARDIOGRAM (TEE);  Surgeon: Jake Bathe, MD;  Location: [facility] ENDOSCOPY;  Service: Cardiovascular;  Laterality: N/A;   HPI:  54 year old male with past medical history of systolic/diastolic heart failure with ejection fraction of 20%, diagnosed a month ago who left AMA before work-up could be completed who returned back on 12/24 with some confusion and right-sided weakness.  Admitted to the hospitalist service and work-up revealed a 1.5 cm left internal capsule subcortical acute infarct as well as moderate right vertebral artery stenosis   Assessment / Plan / Recommendation Clinical Impression  Pt presents with novel  cognitive linguistic deficits post CVA. Deficits in cognition were global including reduced attention, reduced problem solving, reduced safety awareness, decreased executive function skills and disorientation. Pt with reduced breath support for speech greatly impairing vocal intensity and speech intelligibility. Pt appeared to exhibit some expressive aphasia of speech with hesitations appearing consistent with word finding difficulty. When questioned if having word finding trouble patient stated yes. Per discussion with RN pt not a fully reliable historian. Recommend 24 hour care and [facility] SLP services to maximize cognitive linguistic recovery. DC anticipated this date.    SLP Assessment  SLP Recommendation/Assessment: Patient needs continued Speech Lanaguage Pathology Services SLP Visit Diagnosis: Aphasia (R47.01);Cognitive communication deficit (R41.841)    Follow Up Recommendations  Home health SLP    Frequency and Duration Other (Comment) (DC anticipated this date)         SLP Evaluation Cognition  Overall Cognitive Status: Impaired/Different from baseline Arousal/Alertness: Awake/alert Orientation Level: Oriented to person;Disoriented to time;Disoriented to place Attention: Focused;Sustained Focused Attention: Impaired Focused Attention Impairment: Verbal basic;Functional basic Sustained Attention: Impaired Memory: Impaired Awareness: Impaired Problem Solving: Impaired Executive Function: Self Monitoring;Organizing Organizing: Impaired Self Monitoring: Impaired Safety/Judgment: Impaired       Comprehension  Auditory Comprehension Overall Auditory Comprehension: Appears within functional limits for tasks assessed    Expression Expression Primary Mode of Expression: Verbal Verbal Expression Overall Verbal Expression: Impaired Initiation: Impaired Interfering Components: Attention Written Expression Dominant Hand: Right   Oral / Motor  Oral Motor/Sensory Function Overall  Oral Motor/Sensory Function: Within functional limits Motor Speech Overall Motor Speech: Impaired Respiration: Impaired Level of Impairment: Word Phonation: Low vocal intensity Resonance: Within functional limits Articulation: Impaired   GO  Hilari Wethington E Danessa Mensch MA, CCC-SLP Acute Rehabilitation Services  01/08/2020, 3:39 PM

## 2020-01-08 NOTE — Discharge Instructions (Addendum)
Hypertension, Adult Hypertension is another name for high blood pressure. High blood pressure forces your heart to work harder to pump blood. This can cause problems over time. There are two numbers in a blood pressure reading. There is a top number (systolic) over a bottom number (diastolic). It is best to have a blood pressure that is below 120/80. Healthy choices can help lower your blood pressure, or you may need medicine to help lower it. What are the causes? The cause of this condition is not known. Some conditions may be related to high blood pressure. What increases the risk?  Smoking.  Having type 2 diabetes mellitus, high cholesterol, or both.  Not getting enough exercise or physical activity.  Being overweight.  Having too much fat, sugar, calories, or salt (sodium) in your diet.  Drinking too much alcohol.  Having long-term (chronic) kidney disease.  Having a family history of high blood pressure.  Age. Risk increases with age.  Race. You may be at higher risk if you are African American.  Gender. Men are at higher risk than women before age 66. After age 7, women are at higher risk than men.  Having obstructive sleep apnea.  Stress. What are the signs or symptoms?  High blood pressure may not cause symptoms. Very high blood pressure (hypertensive crisis) may cause: ? Headache. ? Feelings of worry or nervousness (anxiety). ? Shortness of breath. ? Nosebleed. ? A feeling of being sick to your stomach (nausea). ? Throwing up (vomiting). ? Changes in how you see. ? Very bad chest pain. ? Seizures. How is this treated?  This condition is treated by making healthy lifestyle changes, such as: ? Eating healthy foods. ? Exercising more. ? Drinking less alcohol.  Your health care provider may prescribe medicine if lifestyle changes are not enough to get your blood pressure under control, and if: ? Your top number is above 130. ? Your bottom number is above  80.  Your personal target blood pressure may vary. Follow these instructions at home: Eating and drinking   If told, follow the DASH eating plan. To follow this plan: ? Fill one half of your plate at each meal with fruits and vegetables. ? Fill one fourth of your plate at each meal with whole grains. Whole grains include whole-wheat pasta, brown rice, and whole-grain bread. ? Eat or drink low-fat dairy products, such as skim milk or low-fat yogurt. ? Fill one fourth of your plate at each meal with low-fat (lean) proteins. Low-fat proteins include fish, chicken without skin, eggs, beans, and tofu. ? Avoid fatty meat, cured and processed meat, or chicken with skin. ? Avoid pre-made or processed food.  Eat less than 1,500 mg of salt each day.  Do not drink alcohol if: ? Your doctor tells you not to drink. ? You are pregnant, may be pregnant, or are planning to become pregnant.  If you drink alcohol: ? Limit how much you use to:  0-1 drink a day for women.  0-2 drinks a day for men. ? Be aware of how much alcohol is in your drink. In the [place] one drink equals one 12 oz bottle of beer (355 mL), one 5 oz glass of wine (148 mL), or one 1 oz glass of hard liquor (44 mL). Lifestyle   Work with your doctor to stay at a healthy weight or to lose weight. Ask your doctor what the best weight is for you.  Get at least 30 minutes of exercise  most days of the week. This may include walking, swimming, or biking.  Get at least 30 minutes of exercise that strengthens your muscles (resistance exercise) at least 3 days a week. This may include lifting weights or doing Pilates.  Do not use any products that contain nicotine or tobacco, such as cigarettes, e-cigarettes, and chewing tobacco. If you need help quitting, ask your doctor.  Check your blood pressure at home as told by your doctor.  Keep all follow-up visits as told by your doctor. This is important. Medicines  Take over-the-counter  and prescription medicines only as told by your doctor. Follow directions carefully.  Do not skip doses of blood pressure medicine. The medicine does not work as well if you skip doses. Skipping doses also puts you at risk for problems.  Ask your doctor about side effects or reactions to medicines that you should watch for. Contact a doctor if you:  Think you are having a reaction to the medicine you are taking.  Have headaches that keep coming back (recurring).  Feel dizzy.  Have swelling in your ankles.  Have trouble with your vision. Get help right away if you:  Get a very bad headache.  Start to feel mixed up (confused).  Feel weak or numb.  Feel faint.  Have very bad pain in your: ? Chest. ? Belly (abdomen).  Throw up more than once.  Have trouble breathing. Summary  Hypertension is another name for high blood pressure.  High blood pressure forces your heart to work harder to pump blood.  For most people, a normal blood pressure is less than 120/80.  Making healthy choices can help lower blood pressure. If your blood pressure does not get lower with healthy choices, you may need to take medicine. This information is not intended to replace advice given to you by your health care provider. Make sure you discuss any questions you have with your health care provider. Document Revised: 09/07/2017 Document Reviewed: 09/07/2017 Elsevier Patient Education  2020 [employer]. Information on my medicine - ELIQUIS (apixaban)  Why was Eliquis prescribed for you? Eliquis was prescribed for you to reduce the risk of a blood clot forming that can cause a stroke if you have a medical condition called atrial fibrillation (a type of irregular heartbeat).  What do You need to know about Eliquis ? Take your Eliquis TWICE DAILY - one tablet in the morning and one tablet in the evening with or without food. If you have difficulty swallowing the tablet whole please discuss  with your pharmacist how to take the medication safely.  Take Eliquis exactly as prescribed by your doctor and DO NOT stop taking Eliquis without talking to the doctor who prescribed the medication.  Stopping may increase your risk of developing a stroke.  Refill your prescription before you run out.  After discharge, you should have regular check-up appointments with your healthcare provider that is prescribing your Eliquis.  In the future your dose may need to be changed if your kidney function or weight changes by a significant amount or as you get older.  What do you do if you miss a dose? If you miss a dose, take it as soon as you remember on the same day and resume taking twice daily.  Do not take more than one dose of ELIQUIS at the same time to make up a missed dose.  Important Safety Information A possible side effect of Eliquis is bleeding. You should call your healthcare provider  right away if you experience any of the following: ? Bleeding from an injury or your nose that does not stop. ? Unusual colored urine (red or dark brown) or unusual colored stools (red or black). ? Unusual bruising for unknown reasons. ? A serious fall or if you hit your head (even if there is no bleeding).  Some medicines may interact with Eliquis and might increase your risk of bleeding or clotting while on Eliquis. To help avoid this, consult your healthcare provider or pharmacist prior to using any new prescription or non-prescription medications, including herbals, vitamins, non-steroidal anti-inflammatory drugs (NSAIDs) and supplements.  This website has more information on Eliquis (apixaban): http://[email or web address].com/eliquis/home

## 2020-01-08 NOTE — Progress Notes (Signed)
Physical Therapy Treatment Patient Details Name: Kevin [name] MRN: [id number] DOB: March 20, 1965 Today's Date: 01/08/2020    History of Present Illness 54 year old with recent diagnosis of month ago for systolic/diastolic heart failure with ejection fraction 20% to left AMA now presents with acute Left internal capsule infarct, as well as moderate chronic small vessel disease with chronic lacunar infarcts in bil. basal ganglia.  Also diagnosed with  metabolic encephalopathy. The pt has a history of noncompliance with medications. 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    PT Comments    Pt very quiet, little interaction with therapist. He required supervision bed mobility, min guard assist transfers and min guard assist ambulation in room without AD. Pt declining hallway ambulation. He just climbed back in bed and closed his eyes. Attempted to engage pt in LE bed exercises, but pt unwilling to further acknowledge therapist.    Follow Up Recommendations  Home health PT;Supervision/Assistance - 24 hour ([facility], [facility] for chronic disease management)     Equipment Recommendations  None recommended by PT    Recommendations for Other Services       Precautions / Restrictions Precautions Precautions: Fall    Mobility  Bed Mobility Overal bed mobility: Needs Assistance Bed Mobility: Supine to Sit;Sit to Supine     Supine to sit: Supervision Sit to supine: Supervision   General bed mobility comments: increased time, supervision for safety  Transfers Overall transfer level: Needs assistance Equipment used: None Transfers: Sit to/from Stand Sit to Stand: Min guard         General transfer comment: min guard for safety  Ambulation/Gait Ambulation/Gait assistance:  Min guard Gait Distance (Feet): 25 Feet Assistive device: None Gait Pattern/deviations: Drifts right/left;Step-through pattern Gait velocity: decreased   General Gait Details: mildly unsteady. Gait limited to in room. Pt declining hallway ambulation.   Stairs             Wheelchair Mobility    Modified Rankin (Stroke Patients Only) Modified Rankin (Stroke Patients Only) Pre-Morbid Rankin Score: No significant disability Modified Rankin: Moderately severe disability     Balance Overall balance assessment: Needs assistance Sitting-balance support: No upper extremity supported;Feet supported Sitting balance-Leahy Scale: Good     Standing balance support: No upper extremity supported;During functional activity Standing balance-Leahy Scale: Fair                              Cognition Arousal/Alertness: Awake/alert;Lethargic Behavior During Therapy: Flat affect Overall Cognitive Status: Impaired/Different from baseline Area of Impairment: Orientation;Attention;Memory;Following commands;Safety/judgement;Awareness;Problem solving                 Orientation Level: Disoriented to;Time;Situation Current Attention Level: Sustained Memory: Decreased short-term memory Following Commands: Follows one step commands consistently;Follows one step commands with increased time Safety/Judgement: Decreased awareness of safety;Decreased awareness of deficits Awareness: Intellectual Problem Solving: Slow processing;Decreased initiation;Difficulty sequencing;Requires verbal cues;Requires tactile cues General Comments: Difficulty staying on task.      Exercises      General Comments General comments (skin integrity, edema, etc.): Resting HR in 50s      Pertinent Vitals/Pain Pain Assessment: No/denies pain    Home Living                      Prior  Function            PT Goals (current goals can now be found in the care plan section) Acute Rehab  PT Goals Patient Stated Goal: not stated Progress towards PT goals: Progressing toward goals    Frequency    Min 4X/week      PT Plan Current plan remains appropriate    Co-evaluation              AM-PAC PT "6 Clicks" Mobility   Outcome Measure  Help needed turning from your back to your side while in a flat bed without using bedrails?: None Help needed moving from lying on your back to sitting on the side of a flat bed without using bedrails?: A Little Help needed moving to and from a bed to a chair (including a wheelchair)?: A Little Help needed standing up from a chair using your arms (e.g., wheelchair or bedside chair)?: A Little Help needed to walk in hospital room?: A Little Help needed climbing 3-5 steps with a railing? : A Little 6 Click Score: 19    End of Session Equipment Utilized During Treatment: Gait belt Activity Tolerance: Patient limited by lethargy Patient left: in bed;with call bell/phone within reach Nurse Communication: Mobility status PT Visit Diagnosis: Unsteadiness on feet (R26.81);Other abnormalities of gait and mobility (R26.89);Muscle weakness (generalized) (M62.81)     Time: 0160-1093 PT Time Calculation (min) (ACUTE ONLY): 10 min  Charges:  $Gait Training: 8-22 mins                     Aida Raider, PT  Office # [phone] Pager [phone]    Ilda Foil 01/08/2020, 9:21 AM

## 2020-01-08 NOTE — Progress Notes (Signed)
Patient Advocate Encounter  Completed application for [employer] Patient Assistance Program sent in an effort to reduce the patient's out of pocket expense for Eliquis to $0.    Application completed and faxed to [phone].   [employer] patient assistance phone number for follow up is [phone].     Tristian Sickinger Boothby, PharmD, BCPS Heart Failure Stewardship Pharmacist Phone [phone]  Please check [email or web address] for unit-specific pharmacist phone numbers  

## 2020-01-08 NOTE — Discharge Summary (Addendum)
Discharge Summary  Kevin [name] [facility] ONG:[id number] DOB: 1966-01-03  PCP: [facility], [facility]  Admit date: 01/04/2020 Discharge date: 01/08/2020  Time spent: 25 minutes  Recommendations for Outpatient Follow-up:  1. New medication: Eliquis 5 mg po BID 2. Patient will follow up with Dr. Pearlean Brownie, neurology, in 4 weeks 3. Patient will follow up with cardiology in the next 4 weeks 4. Medication change: Patient previously on Lipitor 10 mg, increased to 80 mg 5. Patient being discharged with home health PT, OT, RN and aide under the care of his father  Discharge Diagnoses:  Active Hospital Problems   Diagnosis Date Noted  . CVA (cerebral vascular accident) (HCC) 01/04/2020  . CKD (chronic kidney disease), stage III (HCC) 01/08/2020  . Essential hypertension 01/05/2020  . Acute on chronic combined systolic and diastolic CHF (congestive heart failure) (HCC) 01/05/2020  . Marijuana abuse 11/29/2019  . Tobacco dependence 11/29/2019  . Diabetes mellitus without complication [facility])     Resolved Hospital Problems  No resolved problems to display.    Discharge Condition: Improved, being discharged home  Diet recommendation: Heart healthy, carb modified  Vitals:   01/08/20 0439 01/08/20 0759  BP: (!) 155/92 (!) 149/91  Pulse: 73 (!) 54  Resp: 14 14  Temp: 98.3 F (36.8 C) (!) 97.1 F (36.2 C)  SpO2: 100%     History of present illness:  54 year old male with past medical history of systolic/diastolic heart failure with ejection fraction of 20%, diagnosed a month ago who left AMA before work-up could be completed who returned back on 12/24 with some confusion and right-sided weakness.  Admitted to the hospitalist service and work-up revealed a 1.5 cm left internal capsule subcortical acute infarct as well as moderate right vertebral artery stenosis.  Hospital Course:  Principal Problem:   CVA (cerebral vascular accident) [facility]): Neurology consulted.  CVA felt to  possibly be embolic.  Patient started on aspirin 325  plus Plavix 75 initially.  Patient's right-sided weakness had some mild improvement since during his hospitalization.  Confusion improved although still he has some cognitive deficits and gets easily distracted.  Echocardiogram done noted no evidence of thrombus although did note improvement in his systolic function, see below.  Further work-up revealed good control of his diabetes with A1c now at 5.8 and he has been on Metformin.  LDL found be elevated at 149 despite being on Lipitor 10 mg, increased to 80 mg.  Patient underwent a TEE which was unrevealing, noting no evidence of thrombus or PFO.  It was suspected that patient may have paroxysmal A. fib.  Unfortunately, has a history of strong noncompliance and along with lack of insurance, not able to get a loop monitor for him or put him on anticoagulation.  However, in discussion with pharmacy, company that makes Eliquis can do a medication assistance & this was set up.  Therefore, pt will be discharged on Eliquis 5 mg po BID & stop aspirin & plavix.  He was evaluated by PT and OT who recommended home health, as long as he had 24-hour supervision given his cognitive deficits.  Patient dad said he was able to provide a supervision so home health has been set up.  Patient will follow up with neurology in 1 month.  Active Problems:   Diabetes mellitus without complication (HCC): On Metformin.  A1c a month ago was at 6.8 now down to 5.8.  Will need to be cautious given that Metformin can have issues with CHF and lactic  acidosis.  However it does seem to be controlling his diabetes very well.    Tobacco dependence/marijuana use:: Counseled to quit    Essential hypertension: Patient had been on HCTZ and lisinopril prior to coming in.  He will continue these medications upon discharge.  His resting heart rate is in the 50s to 60s, so putting him on a beta-blocker would not be wise.  I will add some permissive  hypertension given acute CVA.  This has been trending downward, especially with diuresis.    Acute on chronic combined systolic and diastolic CHF (congestive heart failure) (HCC): Patient a month ago found to have an ejection fraction of 20%.  He left AMA although he did take medicines for lisinopril and HCTZ.  On admission, BNP elevated at 455 and patient's HCTZ was held and started on IV Lasix.  He is diuresed almost 3 L and pressures come down accordingly.  Echocardiogram done this admission noted improvement in his ejection fraction to 40%.  Have set up a follow-up appointment with him with cardiology.    CKD (chronic kidney disease), stage III [facility]): Patient had been on HCTZ and lisinopril prior to coming in.  On admission, patient's creatinine was 1.27 and has been slowly trending upward during hospitalization, in part due to diuresis with IV Lasix.  Creatinine at time of discharge at 1.92 and he is going back to his HCTZ.  Renal function is to be followed closely.  He has a follow-up appoint with cardiology in the next month and at that time, repeat basic metabolic panel can be checked.   Consultants:  Neurology  Cardiology  Procedures:  Echocardiogram done 12/25: Improved ejection fraction from a month ago with 35 to 40% plus grade 2 diastolic dysfunction plus severe left ventricular hypertrophy  TEE done 12/27: No evidence of thrombus.  No PFO.    Discharge Exam: BP (!) 149/91 (BP Location: Left Arm)   Pulse (!) 54   Temp (!) 97.1 F (36.2 C) (Oral)   Resp 14   Ht 6\' 2"  (1.88 m)   Wt 73.2 kg   SpO2 100%   BMI 20.72 kg/m   General: Alert, oriented x2, gets easily distracted Cardiovascular: Regular rate and rhythm, S1-S2, 2 out of 6 systolic ejection murmur Respiratory: Clear to auscultation bilaterally  Discharge Instructions You were cared for by a hospitalist during your hospital stay. If you have any questions about your discharge medications or the care you received  while you were in the hospital after you are discharged, you can call the unit and asked to speak with the hospitalist on call if the hospitalist that took care of you is not available. Once you are discharged, your primary care physician will handle any further medical issues. Please note that NO REFILLS for any discharge medications will be authorized once you are discharged, as it is imperative that you return to your primary care physician (or establish a relationship with a primary care physician if you do not have one) for your aftercare needs so that they can reassess your need for medications and monitor your lab values.  Discharge Instructions    Ambulatory referral to Neurology   Complete by: As directed    Follow up with Dr. at [facility] in 3-4 weeks. Too complicated for NP to follow. Thanks.   Diet - low sodium heart healthy   Complete by: As directed    Increase activity slowly   Complete by: As directed  Allergies as of 01/08/2020   No Known Allergies     Medication List    TAKE these medications   amLODipine 10 MG tablet Commonly known as: NORVASC Take 10 mg by mouth daily.   apixaban 5 MG Tabs tablet Commonly known as: Eliquis Take 1 tablet (5 mg total) by mouth 2 (two) times daily.   atorvastatin 80 MG tablet Commonly known as: LIPITOR Take 1 tablet (80 mg total) by mouth daily. Start taking on: January 09, 2020 What changed:   medication strength  how much to take   hydrochlorothiazide 25 MG tablet Commonly known as: HYDRODIURIL Take 25 mg by mouth 2 (two) times daily.   lisinopril 20 MG tablet Commonly known as: ZESTRIL Take 20 mg by mouth daily.   metFORMIN 1000 MG tablet Commonly known as: GLUCOPHAGE Take 1,000 mg by mouth in the morning and at bedtime.            Durable Medical Equipment  (From admission, onward)         Start     Ordered   01/07/20 1600  For home use only DME Tub bench  Once        01/07/20 1600           No Known Allergies  Follow-up Information    Micki Riley, MD. Schedule an appointment as soon as possible for a visit in 4 week(s).   Specialties: Neurology, Radiology Contact information: [address] [place] [address] [phone]                The results of significant diagnostics from this hospitalization (including imaging, microbiology, ancillary and laboratory) are listed below for reference.    Significant Diagnostic Studies: CT ANGIO HEAD W OR WO CONTRAST  Result Date: 01/05/2020 CLINICAL DATA:  Follow-up examination for acute stroke. EXAM: CT ANGIOGRAPHY HEAD AND NECK TECHNIQUE: Multidetector CT imaging of the head and neck was performed using the standard protocol during bolus administration of intravenous contrast. Multiplanar CT image reconstructions and MIPs were obtained to evaluate the vascular anatomy. Carotid stenosis measurements (when applicable) are obtained utilizing NASCET criteria, using the distal internal carotid diameter as the denominator. CONTRAST:  OMNIPAQUE IOHEXOL 350 MG/ML SOLN COMPARISON:  Prior MRI from 01/04/2020 FINDINGS: CT HEAD FINDINGS Brain: Atrophy with chronic microvascular ischemic disease again noted. Scattered remote lacunar infarcts noted about the bilateral basal ganglia. Previously identified evolving acute left internal capsule infarct again noted, stable in size and morphology from prior MRI. No hemorrhagic transformation, mass effect, or other complication. No other acute large vessel territory infarct. No intracranial hemorrhage. No mass lesion, midline shift, or significant mass effect. No hydrocephalus or extra-axial fluid collection. Vascular: No hyperdense vessel. Skull: Scalp soft tissues and calvarium within normal limits. Sinuses: Moderate mucosal thickening noted within the maxillary sinuses. Visualized paranasal sinuses are otherwise clear. Orbits: Globes and orbital soft tissues within normal  limits. Review of the MIP images confirms the above findings CTA NECK FINDINGS Aortic arch: Visualized aortic arch of normal caliber with normal branch pattern. No flow-limiting stenosis about the origin of the great vessels. Right carotid system: Right common and internal carotid arteries patent without stenosis, dissection or occlusion. Mild atheromatous change about the right bifurcation without stenosis. Right ICA partially medialized into the retropharyngeal space. Left carotid system: Left common and internal carotid arteries widely patent without stenosis, dissection or occlusion. Mild atheromatous plaque about the left bifurcation without stenosis. Vertebral arteries: Both vertebral arteries arise  from subclavian arteries. No proximal subclavian artery stenosis. Vertebral arteries widely patent within the neck without stenosis, dissection or occlusion. Skeleton: No visible acute osseous abnormality. No discrete or worrisome osseous lesions. Other neck: No other acute soft tissue abnormality within the neck. No visible mass or adenopathy. Upper chest: Visualized upper chest demonstrates no acute finding. Review of the MIP images confirms the above findings CTA HEAD FINDINGS Anterior circulation: Examination technically limited by patient positioning. Both internal carotid arteries patent to the termini without stenosis. A1 segments patent bilaterally. Normal anterior communicating artery complex. Anterior cerebral arteries patent to their distal aspects without stenosis. No M1 stenosis or occlusion. Negative MCA bifurcations. Distal MCA branches well perfused and symmetric. Posterior circulation: Left V4 segment widely patent to the vertebrobasilar junction. Short-segment severe distal right V4 stenosis (series 12, image 254). Right V4 segment attenuated proximally. Left PICA origin patent and normal. Right PICA of faintly visualize, also grossly patent. Basilar patent to its distal aspect without stenosis.  Superior cerebral arteries patent bilaterally. Both PCA supplied via the basilar as well as prominent bilateral posterior communicating arteries. Both PCAs grossly perfused to their distal aspects. Venous sinuses: Patent. Anatomic variants: None significant. Review of the MIP images confirms the above findings IMPRESSION: CT HEAD IMPRESSION: 1. Continued interval evolution of previously identified acute left internal capsule infarct. No evidence for hemorrhagic transformation or other complication. 2. Underlying moderate chronic microvascular ischemic disease with chronic lacunar infarcts as above. No other acute intracranial abnormality. CTA HEAD AND NECK IMPRESSION: 1. Negative CTA for large vessel occlusion. 2. Short-segment severe near occlusive distal right V4 stenosis. 3. Additional mild for age atheromatous change elsewhere about the major arterial vasculature of the head and neck. No other hemodynamically significant or correctable stenosis. Electronically Signed   By: Rise Mu M.D.   On: 01/05/2020 04:37   CT Head Wo Contrast  Result Date: 01/04/2020 CLINICAL DATA:  Altered mental status and dizziness since this morning. EXAM: CT HEAD WITHOUT CONTRAST TECHNIQUE: Contiguous axial images were obtained from the base of the skull through the vertex without intravenous contrast. COMPARISON:  None. FINDINGS: Brain: Ventricles, cisterns and other CSF spaces are within normal. There is chronic ischemic microvascular disease. Old right caudate lacunar infarct. Patchy low-attenuation over the bilateral basal ganglia most prominent over the region of the genu of the left internal capsule which may be due to subacute to chronic ischemic change. No mass, mass effect or shift of midline structures. No acute hemorrhage. Vascular: No hyperdense vessel or unexpected calcification. Skull: Normal. Negative for fracture or focal lesion. Sinuses/Orbits: Orbits are normal symmetric. Near complete opacification  of the maxillary sinuses bilaterally. Other: None. IMPRESSION: 1. Patchy low-attenuation over the bilateral basal ganglia most prominent over the region of the genu of the left internal capsule which may be due to subacute to chronic ischemic change. 2. Chronic ischemic microvascular disease. Old right caudate lacunar infarct. 3. Chronic sinus inflammatory disease. Electronically Signed   By: Elberta Fortis M.D.   On: 01/04/2020 14:05   CT ANGIO NECK W OR WO CONTRAST  Result Date: 01/05/2020 CLINICAL DATA:  Follow-up examination for acute stroke. EXAM: CT ANGIOGRAPHY HEAD AND NECK TECHNIQUE: Multidetector CT imaging of the head and neck was performed using the standard protocol during bolus administration of intravenous contrast. Multiplanar CT image reconstructions and MIPs were obtained to evaluate the vascular anatomy. Carotid stenosis measurements (when applicable) are obtained utilizing NASCET criteria, using the distal internal carotid diameter as the denominator. CONTRAST:  OMNIPAQUE IOHEXOL 350 MG/ML SOLN COMPARISON:  Prior MRI from 01/04/2020 FINDINGS: CT HEAD FINDINGS Brain: Atrophy with chronic microvascular ischemic disease again noted. Scattered remote lacunar infarcts noted about the bilateral basal ganglia. Previously identified evolving acute left internal capsule infarct again noted, stable in size and morphology from prior MRI. No hemorrhagic transformation, mass effect, or other complication. No other acute large vessel territory infarct. No intracranial hemorrhage. No mass lesion, midline shift, or significant mass effect. No hydrocephalus or extra-axial fluid collection. Vascular: No hyperdense vessel. Skull: Scalp soft tissues and calvarium within normal limits. Sinuses: Moderate mucosal thickening noted within the maxillary sinuses. Visualized paranasal sinuses are otherwise clear. Orbits: Globes and orbital soft tissues within normal limits. Review of the MIP images confirms the  above findings CTA NECK FINDINGS Aortic arch: Visualized aortic arch of normal caliber with normal branch pattern. No flow-limiting stenosis about the origin of the great vessels. Right carotid system: Right common and internal carotid arteries patent without stenosis, dissection or occlusion. Mild atheromatous change about the right bifurcation without stenosis. Right ICA partially medialized into the retropharyngeal space. Left carotid system: Left common and internal carotid arteries widely patent without stenosis, dissection or occlusion. Mild atheromatous plaque about the left bifurcation without stenosis. Vertebral arteries: Both vertebral arteries arise from subclavian arteries. No proximal subclavian artery stenosis. Vertebral arteries widely patent within the neck without stenosis, dissection or occlusion. Skeleton: No visible acute osseous abnormality. No discrete or worrisome osseous lesions. Other neck: No other acute soft tissue abnormality within the neck. No visible mass or adenopathy. Upper chest: Visualized upper chest demonstrates no acute finding. Review of the MIP images confirms the above findings CTA HEAD FINDINGS Anterior circulation: Examination technically limited by patient positioning. Both internal carotid arteries patent to the termini without stenosis. A1 segments patent bilaterally. Normal anterior communicating artery complex. Anterior cerebral arteries patent to their distal aspects without stenosis. No M1 stenosis or occlusion. Negative MCA bifurcations. Distal MCA branches well perfused and symmetric. Posterior circulation: Left V4 segment widely patent to the vertebrobasilar junction. Short-segment severe distal right V4 stenosis (series 12, image 254). Right V4 segment attenuated proximally. Left PICA origin patent and normal. Right PICA of faintly visualize, also grossly patent. Basilar patent to its distal aspect without stenosis. Superior cerebral arteries patent bilaterally.  Both PCA supplied via the basilar as well as prominent bilateral posterior communicating arteries. Both PCAs grossly perfused to their distal aspects. Venous sinuses: Patent. Anatomic variants: None significant. Review of the MIP images confirms the above findings IMPRESSION: CT HEAD IMPRESSION: 1. Continued interval evolution of previously identified acute left internal capsule infarct. No evidence for hemorrhagic transformation or other complication. 2. Underlying moderate chronic microvascular ischemic disease with chronic lacunar infarcts as above. No other acute intracranial abnormality. CTA HEAD AND NECK IMPRESSION: 1. Negative CTA for large vessel occlusion. 2. Short-segment severe near occlusive distal right V4 stenosis. 3. Additional mild for age atheromatous change elsewhere about the major arterial vasculature of the head and neck. No other hemodynamically significant or correctable stenosis. Electronically Signed   By: Rise Mu M.D.   On: 01/05/2020 04:37   MR BRAIN WO CONTRAST  Result Date: 01/04/2020 CLINICAL DATA:  TIA.  Acute change in mental status. EXAM: MRI HEAD WITHOUT CONTRAST TECHNIQUE: Multiplanar, multiecho pulse sequences of the brain and surrounding structures were obtained without intravenous contrast. COMPARISON:  Head CT 01/04/2020 FINDINGS: Brain: There is a 1.5 cm acute infarct involving the genu and posterior limb of the left  internal capsule. Patchy T2 hyperintensities in the cerebral white matter bilaterally are nonspecific but compatible with moderate chronic small vessel ischemic disease. There are chronic lacunar infarcts in the basal ganglia bilaterally. There is also a small chronic left. Cerebellar infarct there is mild asymmetric ex vacuo dilatation of the right lateral ventricle. A chronic microhemorrhage is noted in the right frontal lobe. Vascular: Major intracranial vascular flow voids are preserved. Skull and upper cervical spine: No suspicious marrow  lesion. Sinuses/Orbits: Unremarkable orbits. Extensive mucosal thickening in the maxillary sinuses. Clear mastoid air cells. Other: None. IMPRESSION: 1. Acute left internal capsule infarct. 2. Moderate chronic small vessel ischemic disease with chronic lacunar infarcts as above. Electronically Signed   By: Sebastian AcheAllen  Grady M.D.   On: 01/04/2020 15:15   DG Chest Port 1 View  Result Date: 01/04/2020 CLINICAL DATA:  Altered mental status. EXAM: PORTABLE CHEST 1 VIEW COMPARISON:  11/29/2019 FINDINGS: Midline trachea. Mild cardiomegaly, accentuated by AP portable technique. Suspect a small left pleural effusion. No pneumothorax. No congestive failure. Left lung base not well evaluated secondary to AP portable technique and low lung volumes. Suspicion of airspace disease. IMPRESSION: Possible small left pleural effusion and adjacent airspace disease. This could represent atelectasis or infection. Consider PA and lateral radiographs if possible. Electronically Signed   By: Jeronimo GreavesKyle  Talbot M.D.   On: 01/04/2020 14:21   ECHOCARDIOGRAM COMPLETE  Result Date: 01/05/2020    ECHOCARDIOGRAM REPORT   Patient Name:   Joan MayansKEITH L Sterry Date of Exam: 01/05/2020 Medical Rec #:  [id number]        Height:       74.0 in Accession #:    [id number]       Weight:       195.0 lb Date of Birth:  09/08/1965        BSA:          2.150 m Patient Age:    54 years         BP:           161/96 mmHg Patient Gender: M                HR:           63 bpm. Exam Location:  Inpatient Procedure: 2D Echo, Cardiac Doppler and Color Doppler Indications:    Stroke  History:        Patient has prior history of Echocardiogram examinations, most                 recent 11/29/2019. Risk Factors:Current Smoker, Hypertension and                 Diabetes. CVA.  Sonographer:    Ross LudwigArthur Guy RDCS (AE) Referring Phys: [id number] Teddy SpikeYRONE A KYLE IMPRESSIONS  1. Left ventricular ejection fraction, by estimation, is 35 to 40%. The left ventricle has moderately decreased  function. The left ventricle demonstrates global hypokinesis. There is severe concentric left ventricular hypertrophy. Left ventricular diastolic parameters are consistent with Grade II diastolic dysfunction (pseudonormalization). Elevated left atrial pressure.  2. Right ventricular systolic function is normal. The right ventricular size is normal. Tricuspid regurgitation signal is inadequate for assessing PA pressure.  3. Left atrial size was mildly dilated.  4. A small pericardial effusion is present. The pericardial effusion is posterior to the left ventricle.  5. The mitral valve is grossly normal. Trivial mitral valve regurgitation. No evidence of mitral stenosis.  6. The aortic valve is tricuspid. Aortic valve regurgitation is  not visualized. No aortic stenosis is present.  7. The inferior vena cava is normal in size with greater than 50% respiratory variability, suggesting right atrial pressure of 3 mmHg. Comparison(s): Changes from prior study are noted. EF has improved to 35-40%. Still with global HK. Severe concentric LVH consistent with history of poorly conrtrolled HTN. FINDINGS  Left Ventricle: Left ventricular ejection fraction, by estimation, is 35 to 40%. The left ventricle has moderately decreased function. The left ventricle demonstrates global hypokinesis. The left ventricular internal cavity size was normal in size. There is severe concentric left ventricular hypertrophy. Left ventricular diastolic parameters are consistent with Grade II diastolic dysfunction (pseudonormalization). Elevated left atrial pressure. Right Ventricle: The right ventricular size is normal. No increase in right ventricular wall thickness. Right ventricular systolic function is normal. Tricuspid regurgitation signal is inadequate for assessing PA pressure. Left Atrium: Left atrial size was mildly dilated. Right Atrium: Right atrial size was normal in size. Pericardium: A small pericardial effusion is present. The  pericardial effusion is posterior to the left ventricle. Mitral Valve: The mitral valve is grossly normal. Trivial mitral valve regurgitation. No evidence of mitral valve stenosis. MV peak gradient, 3.8 mmHg. The mean mitral valve gradient is 1.0 mmHg. Tricuspid Valve: The tricuspid valve is grossly normal. Tricuspid valve regurgitation is trivial. No evidence of tricuspid stenosis. Aortic Valve: The aortic valve is tricuspid. Aortic valve regurgitation is not visualized. No aortic stenosis is present. Aortic valve mean gradient measures 5.0 mmHg. Aortic valve peak gradient measures 9.0 mmHg. Aortic valve area, by VTI measures 3.03 cm. Pulmonic Valve: The pulmonic valve was grossly normal. Pulmonic valve regurgitation is not visualized. No evidence of pulmonic stenosis. Aorta: The aortic root and ascending aorta are structurally normal, with no evidence of dilitation. Venous: The inferior vena cava is normal in size with greater than 50% respiratory variability, suggesting right atrial pressure of 3 mmHg. IAS/Shunts: The atrial septum is grossly normal. Additional Comments: There is a small pleural effusion in the left lateral region.  LEFT VENTRICLE PLAX 2D LVIDd:         5.00 cm      Diastology LVIDs:         4.20 cm      LV e' medial:    3.15 cm/s LV PW:         2.30 cm      LV E/e' medial:  17.6 LV IVS:        2.20 cm      LV e' lateral:   2.68 cm/s LVOT diam:     2.30 cm      LV E/e' lateral: 20.6 LV SV:         70 LV SV Index:   33 LVOT Area:     4.15 cm  LV Volumes (MOD) LV vol d, MOD A2C: 179.0 ml LV vol d, MOD A4C: 155.5 ml LV vol s, MOD A2C: 122.0 ml LV vol s, MOD A4C: 104.0 ml LV SV MOD A2C:     57.0 ml LV SV MOD A4C:     155.5 ml LV SV MOD BP:      55.2 ml RIGHT VENTRICLE             IVC RV Basal diam:  2.80 cm     IVC diam: 1.30 cm RV S prime:     13.50 cm/s TAPSE (M-mode): 2.8 cm LEFT ATRIUM             Index  RIGHT ATRIUM           Index LA diam:        3.50 cm 1.63 cm/m  RA Area:     17.50  cm LA Vol (A2C):   90.6 ml 42.14 ml/m RA Volume:   46.10 ml  21.44 ml/m LA Vol (A4C):   82.9 ml 38.55 ml/m LA Biplane Vol: 90.9 ml 42.28 ml/m  AORTIC VALVE AV Area (Vmax):    2.94 cm AV Area (Vmean):   2.68 cm AV Area (VTI):     3.03 cm AV Vmax:           150.00 cm/s AV Vmean:          101.000 cm/s AV VTI:            0.232 m AV Peak Grad:      9.0 mmHg AV Mean Grad:      5.0 mmHg LVOT Vmax:         106.00 cm/s LVOT Vmean:        65.200 cm/s LVOT VTI:          0.169 m LVOT/AV VTI ratio: 0.73  AORTA Ao Root diam: 3.70 cm Ao Asc diam:  3.70 cm MITRAL VALVE MV Area (PHT): 2.17 cm    SHUNTS MV Peak grad:  3.8 mmHg    Systemic VTI:  0.17 m MV Mean grad:  1.0 mmHg    Systemic Diam: 2.30 cm MV Vmax:       0.98 m/s MV Vmean:      52.2 cm/s MV Decel Time: 349 msec MV E velocity: 55.30 cm/s MV A velocity: 86.50 cm/s MV E/A ratio:  0.64 Lennie Odor MD Electronically signed by Lennie Odor MD Signature Date/Time: 01/05/2020/4:36:07 PM    Final    ECHO TEE  Result Date: 01/07/2020    TRANSESOPHOGEAL ECHO REPORT   Patient Name:   CASH DUCE Date of Exam: 01/07/2020 Medical Rec #:  [id number]        Height:       74.0 in Accession #:    [id number]       Weight:       161.8 lb Date of Birth:  1965/08/23        BSA:          1.986 m Patient Age:    54 years         BP:           212/133 mmHg Patient Gender: M                HR:           96 bpm. Exam Location:  Inpatient Procedure: Transesophageal Echo, Color Doppler, Cardiac Doppler and Saline            Contrast Bubble Study Indications:     Stroke  History:         Patient has prior history of Echocardiogram examinations, most                  recent 01/05/2020. Stroke; Risk Factors:Current Smoker,                  Hypertension and Diabetes. Marijuana use.  Sonographer:     Sheralyn Boatman RDCS Referring Phys:  [id number] Corrin Parker Diagnosing Phys: Donato Schultz MD  Sonographer Comments: Technically difficult study due to poor echo windows. PROCEDURE: After  discussion of the risks and benefits of a TEE, an  informed consent was obtained from a family member. The transesophogeal probe was passed without difficulty through the esophogus of the patient. Imaged were obtained with the patient in a left lateral decubitus position. Sedation performed by different physician. The patient was monitored while under deep sedation. Anesthestetic sedation was provided intravenously by Anesthesiology:  of Propofol,  of Lidocaine. The patient's vital signs; including heart rate, blood pressure, and oxygen saturation; remained stable throughout the procedure. The patient developed no complications during the procedure. IMPRESSIONS  1. Left ventricular ejection fraction, by estimation, is 20 to 25%. The left ventricle has severely decreased function. The left ventricle demonstrates global hypokinesis. The left ventricular internal cavity size was mildly dilated. There is severe left ventricular hypertrophy.  2. Right ventricular systolic function is normal. The right ventricular size is normal.  3. Left atrial size was mildly dilated. No left atrial/left atrial appendage thrombus was detected.  4. A small pericardial effusion is present. The pericardial effusion is posterior to the left ventricle. There is no evidence of cardiac tamponade.  5. The mitral valve is normal in structure. Mild mitral valve regurgitation. No evidence of mitral stenosis.  6. The aortic valve is normal in structure. Aortic valve regurgitation is not visualized. No aortic stenosis is present.  7. Agitated saline contrast bubble study was negative, with no evidence of any interatrial shunt. FINDINGS  Left Ventricle: Left ventricular ejection fraction, by estimation, is 20 to 25%. The left ventricle has severely decreased function. The left ventricle demonstrates global hypokinesis. The left ventricular internal cavity size was mildly dilated. There is severe left ventricular hypertrophy. Right Ventricle:  The right ventricular size is normal. No increase in right ventricular wall thickness. Right ventricular systolic function is normal. Left Atrium: Left atrial size was mildly dilated. No left atrial/left atrial appendage thrombus was detected. Right Atrium: Right atrial size was normal in size. Pericardium: A small pericardial effusion is present. The pericardial effusion is posterior to the left ventricle. There is no evidence of cardiac tamponade. Mitral Valve: The mitral valve is normal in structure. Mild mitral valve regurgitation. No evidence of mitral valve stenosis. Tricuspid Valve: The tricuspid valve is normal in structure. Tricuspid valve regurgitation is trivial. Aortic Valve: The aortic valve is normal in structure. Aortic valve regurgitation is not visualized. No aortic stenosis is present. Pulmonic Valve: The pulmonic valve was normal in structure. Pulmonic valve regurgitation is not visualized. Aorta: The aortic root is normal in size and structure. IAS/Shunts: No atrial level shunt detected by color flow Doppler. Agitated saline contrast was given intravenously to evaluate for intracardiac shunting. Agitated saline contrast bubble study was negative, with no evidence of any interatrial shunt. Donato Schultz MD Electronically signed by Donato Schultz MD Signature Date/Time: 01/07/2020/3:07:54 PM    Final     Microbiology: Recent Results (from the past 240 hour(s))  Resp Panel by RT-PCR (Flu A&B, Covid) Nasopharyngeal Swab     Status: None   Collection Time: 01/04/20  2:11 PM   Specimen: Nasopharyngeal Swab; Nasopharyngeal(NP) swabs in vial transport medium  Result Value Ref Range Status   SARS Coronavirus 2 by RT PCR NEGATIVE NEGATIVE Final    Comment: (NOTE) SARS-CoV-2 target nucleic acids are NOT DETECTED.  The SARS-CoV-2 RNA is generally detectable in upper respiratory specimens during the acute phase of infection. The lowest concentration of SARS-CoV-2 viral copies this assay can detect  is 138 copies/mL. A negative result does not preclude SARS-Cov-2 infection and should not be used as the sole basis  for treatment or other patient management decisions. A negative result may occur with  improper specimen collection/handling, submission of specimen other than nasopharyngeal swab, presence of viral mutation(s) within the areas targeted by this assay, and inadequate number of viral copies(<138 copies/mL). A negative result must be combined with clinical observations, patient history, and epidemiological information. The expected result is Negative.  Fact Sheet for Patients:  BloggerCourse.com  Fact Sheet for Healthcare Providers:  SeriousBroker.it  This test is no t yet approved or cleared by the [place] FDA and  has been authorized for detection and/or diagnosis of SARS-CoV-2 by FDA under an Emergency Use Authorization (EUA). This EUA will remain  in effect (meaning this test can be used) for the duration of the COVID-19 declaration under Section 564(b)(1) of the Act, 21 U.S.C.section 360bbb-3(b)(1), unless the authorization is terminated  or revoked sooner.       Influenza A by PCR NEGATIVE NEGATIVE Final   Influenza B by PCR NEGATIVE NEGATIVE Final    Comment: (NOTE) The Xpert Xpress SARS-CoV-2/FLU/RSV plus assay is intended as an aid in the diagnosis of influenza from Nasopharyngeal swab specimens and should not be used as a sole basis for treatment. Nasal washings and aspirates are unacceptable for Xpert Xpress SARS-CoV-2/FLU/RSV testing.  Fact Sheet for Patients: BloggerCourse.com  Fact Sheet for Healthcare Providers: SeriousBroker.it  This test is not yet approved or cleared by the [place] FDA and has been authorized for detection and/or diagnosis of SARS-CoV-2 by FDA under an Emergency Use Authorization (EUA). This EUA will remain in effect  (meaning this test can be used) for the duration of the COVID-19 declaration under Section 564(b)(1) of the Act, 21 U.S.C. section 360bbb-3(b)(1), unless the authorization is terminated or revoked.  Performed at [facility], [address] [place], [place] [address]      Labs: Basic Metabolic Panel: Recent Labs  Lab 01/04/20 1313 01/06/20 0504 01/07/20 0906 01/08/20 0313  NA 139 140 140 141  K 3.8 3.5 3.6 3.6  CL 104 102 101 103  CO2 24 27 26 26   GLUCOSE 169* 118* 117* 108*  BUN 19 19 31* 36*  CREATININE 1.27* 1.55* 1.81* 1.92*  CALCIUM 9.0 9.3 9.4 9.4  MG  --   --   --  1.9   Liver Function Tests: Recent Labs  Lab 01/04/20 1313 01/05/20 0505 01/06/20 0504  AST 15 15 13*  ALT 10 13 9   ALKPHOS 62 77 65  BILITOT 0.9 1.0 1.2  PROT 6.9 7.8 6.5  ALBUMIN 3.9 4.4 3.6   No results for input(s): LIPASE, AMYLASE in the last 168 hours. Recent Labs  Lab 01/05/20 0505  AMMONIA 21   CBC: Recent Labs  Lab 01/04/20 1313 01/06/20 0504 01/07/20 0906 01/08/20 0313  WBC 3.5* 4.0 3.0* 4.0  NEUTROABS  --  1.5*  --   --   HGB 14.6 14.2 14.3 14.3  HCT 44.9 44.7 42.9 43.0  MCV 91.6 90.7 88.8 89.0  PLT 95* 104* 102* 102*   Cardiac Enzymes: No results for input(s): CKTOTAL, CKMB, CKMBINDEX, TROPONINI in the last 168 hours. BNP: BNP (last 3 results) Recent Labs    11/29/19 0037 01/04/20 1339 01/07/20 1549  BNP 1,278.7* 465.9* 46.1    ProBNP (last 3 results) No results for input(s): PROBNP in the last 8760 hours.  CBG: Recent Labs  Lab 01/07/20 1138 01/07/20 1646 01/07/20 2111 01/07/20 2117 01/08/20 0755  GLUCAP 106* 105* 178* 159* 117*  Signed:  Hollice Espy, MD [facility] 01/08/2020, 9:17 AM

## 2020-01-08 NOTE — Progress Notes (Signed)
Occupational Therapy Treatment Patient Details Name: Kevin [name] MRN: [id number] DOB: 1965/09/12 Today's Date: 01/08/2020    History of present illness 54 year old with recent diagnosis of month ago for systolic/diastolic heart failure with ejection fraction 20% to left AMA now presents with acute Left internal capsule infarct, as well as moderate chronic small vessel disease with chronic lacunar infarcts in bil. basal ganglia.  Also diagnosed with  metabolic encephalopathy. The pt has a history of noncompliance with medications. 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   OT comments  Pt making gradual progress towards OT goals this session. Pt continues to present with cognitive deficits in the areas of orientation, attention, memory, following commands, safety/judgement, awareness and Problem solving impacting pts ability to complete BADLs independently. Pt lethargic upon arrival but able to arouse with activity, however once pt back in bed pt immediately returns to sleeping. Pt required min guard to complete household distance functional mobility in room with no AD. Pt required cues to initiate/ terminate and problem solve through all standing grooming tasks at sink. Pt responded to orientation questions but interacts minimally with therapist during session. Pt would continue to benefit from skilled occupational therapy while admitted and after d/c to address the below listed limitations in order to improve overall functional mobility and facilitate independence with BADL participation. DC plan remains appropriate, will follow acutely per POC.     Follow Up Recommendations  Home health OT;Supervision/Assistance - 24 hour;Other (comment) (HHaide and HHRN)    Equipment  Recommendations  Tub/shower seat    Recommendations for Other Services      Precautions / Restrictions Precautions Precautions: Fall Restrictions Weight Bearing Restrictions: No       Mobility Bed Mobility Overal bed mobility: Needs Assistance Bed Mobility: Supine to Sit;Sit to Supine     Supine to sit: Supervision Sit to supine: Supervision   General bed mobility comments: increased time, supervision for safety  Transfers Overall transfer level: Needs assistance Equipment used: None Transfers: Sit to/from Stand Sit to Stand: Min guard         General transfer comment: min guard for safety    Balance Overall balance assessment: Needs assistance Sitting-balance support: No upper extremity supported;Feet supported Sitting balance-Leahy Scale: Good     Standing balance support: No upper extremity supported;During functional activity Standing balance-Leahy Scale: Fair Standing balance comment: pt able to static stand at sink ~ 8 mins for ADLs with no UE support with no LOB                           ADL either performed or assessed with clinical judgement   ADL Overall ADL's : Needs assistance/impaired     Grooming: Wash/dry face;Oral care;Standing;Supervision/safety Grooming Details (indicate cue type and reason): gross supervision with no AD for balance, pt requried cues to initaite and terminate all ADL tasks, very slow to process commands                 Toilet Transfer: Min guard;Ambulation Toilet Transfer Details (indicate cue type and reason): simulated via functional mobility with no AD         Functional mobility during ADLs: Min guard General ADL Comments: pt continues to present with cognitive impairments, impaired safety awarness and impaired sustained attention  Vision       Perception     Praxis      Cognition Arousal/Alertness: Awake/alert;Lethargic (initially lethagic but did arouse more once standing at  sink) Behavior During Therapy: Flat affect Overall Cognitive Status: Impaired/Different from baseline Area of Impairment: Orientation;Attention;Memory;Following commands;Safety/judgement;Awareness;Problem solving                 Orientation Level: Disoriented to;Time;Situation (disoriented to day of week and unable to problem solve approaching holiday despite max cues) Current Attention Level: Focused Memory: Decreased short-term memory Following Commands: Follows multi-step commands inconsistently;Follows one step commands with increased time Safety/Judgement: Decreased awareness of safety;Decreased awareness of deficits Awareness: Intellectual Problem Solving: Slow processing;Decreased initiation;Difficulty sequencing;Requires verbal cues;Requires tactile cues General Comments: pt easily distracted noted to require max mulitmodal cues to attend to ADL tasks at sink, pt responded to orientation questions but interacts minimally with therapist during session. pt requried cues to terminate/ initiate all ADL tasks and required assist to problem solve ADL steps such as applying toothpaste to brush before beginning to brush, as well as spitting into sink during teeth brushing,        Exercises     Shoulder Instructions       General Comments VSS    Pertinent Vitals/ Pain       Pain Assessment: No/denies pain  Home Living                                          Prior Functioning/Environment              Frequency  Min 2X/week        Progress Toward Goals  OT Goals(current goals can now be found in the care plan section)  Progress towards OT goals: Progressing toward goals  Acute Rehab OT Goals Patient Stated Goal: not stated OT Goal Formulation: With patient Time For Goal Achievement: 01/20/20 Potential to Achieve Goals: Good  Plan Discharge plan remains appropriate;Frequency remains appropriate    Co-evaluation                  AM-PAC OT "6 Clicks" Daily Activity     Outcome Measure   Help from another person eating meals?: A Little Help from another person taking care of personal grooming?: A Little Help from another person toileting, which includes using toliet, bedpan, or urinal?: A Lot Help from another person bathing (including washing, rinsing, drying)?: A Lot Help from another person to put on and taking off regular upper body clothing?: A Little Help from another person to put on and taking off regular lower body clothing?: A Lot 6 Click Score: 15    End of Session Equipment Utilized During Treatment: Gait belt  OT Visit Diagnosis: Unsteadiness on feet (R26.81);Cognitive communication deficit (R41.841);Low vision, both eyes (H54.2) Symptoms and signs involving cognitive functions: Cerebral infarction   Activity Tolerance Patient tolerated treatment well   Patient Left in bed;with call bell/phone within reach;with bed alarm set   Nurse Communication Mobility status;Other (comment) (very slow processing and slow to process during session)        Time: 1000-1018 OT Time Calculation (min): 18 min  Charges: OT General Charges $OT Visit: 1 Visit OT Treatments $Self Care/Home Management : 8-22 mins  Lenor Derrick., COTA/L Acute Rehabilitation Services [phone]   Barron Schmid 01/08/2020, 1:00 PM

## 2020-01-11 NOTE — Progress Notes (Signed)
Talked to Amy with [facility]. She stated that she will review the case again  to determine if they can accept the patient for charity, also Data processing manager to review the case. Jiles Crocker RN,MHA,BSN Transition of Care Supervisor [phone]

## 2020-01-23 ENCOUNTER — Other Ambulatory Visit: Payer: Self-pay

## 2020-01-23 ENCOUNTER — Emergency Department (HOSPITAL_COMMUNITY): Payer: Medicaid Other

## 2020-01-23 ENCOUNTER — Inpatient Hospital Stay (HOSPITAL_COMMUNITY)
Admission: EM | Admit: 2020-01-23 | Discharge: 2020-03-26 | DRG: 640 | Disposition: A | Discharge status: Skilled Nursing Facility | Payer: Medicaid Other | Attending: Internal Medicine | Admitting: Internal Medicine

## 2020-01-23 DIAGNOSIS — E860 Dehydration: Secondary | ICD-10-CM | POA: Diagnosis present

## 2020-01-23 DIAGNOSIS — F0280 Dementia in other diseases classified elsewhere without behavioral disturbance: Secondary | ICD-10-CM | POA: Diagnosis present

## 2020-01-23 DIAGNOSIS — I5042 Chronic combined systolic (congestive) and diastolic (congestive) heart failure: Secondary | ICD-10-CM | POA: Diagnosis present

## 2020-01-23 DIAGNOSIS — I10 Essential (primary) hypertension: Secondary | ICD-10-CM | POA: Diagnosis present

## 2020-01-23 DIAGNOSIS — N183 Chronic kidney disease, stage 3 unspecified: Secondary | ICD-10-CM | POA: Diagnosis present

## 2020-01-23 DIAGNOSIS — D61818 Other pancytopenia: Secondary | ICD-10-CM | POA: Diagnosis not present

## 2020-01-23 DIAGNOSIS — R627 Adult failure to thrive: Secondary | ICD-10-CM | POA: Diagnosis present

## 2020-01-23 DIAGNOSIS — Z7984 Long term (current) use of oral hypoglycemic drugs: Secondary | ICD-10-CM

## 2020-01-23 DIAGNOSIS — N179 Acute kidney failure, unspecified: Secondary | ICD-10-CM | POA: Diagnosis present

## 2020-01-23 DIAGNOSIS — H00011 Hordeolum externum right upper eyelid: Secondary | ICD-10-CM

## 2020-01-23 DIAGNOSIS — E119 Type 2 diabetes mellitus without complications: Secondary | ICD-10-CM

## 2020-01-23 DIAGNOSIS — E1122 Type 2 diabetes mellitus with diabetic chronic kidney disease: Secondary | ICD-10-CM | POA: Diagnosis present

## 2020-01-23 DIAGNOSIS — Z8673 Personal history of transient ischemic attack (TIA), and cerebral infarction without residual deficits: Secondary | ICD-10-CM

## 2020-01-23 DIAGNOSIS — H5462 Unqualified visual loss, left eye, normal vision right eye: Secondary | ICD-10-CM | POA: Diagnosis present

## 2020-01-23 DIAGNOSIS — Z8249 Family history of ischemic heart disease and other diseases of the circulatory system: Secondary | ICD-10-CM

## 2020-01-23 DIAGNOSIS — I69351 Hemiplegia and hemiparesis following cerebral infarction affecting right dominant side: Secondary | ICD-10-CM

## 2020-01-23 DIAGNOSIS — I130 Hypertensive heart and chronic kidney disease with heart failure and stage 1 through stage 4 chronic kidney disease, or unspecified chronic kidney disease: Secondary | ICD-10-CM | POA: Diagnosis present

## 2020-01-23 DIAGNOSIS — Z79899 Other long term (current) drug therapy: Secondary | ICD-10-CM

## 2020-01-23 DIAGNOSIS — Z87891 Personal history of nicotine dependence: Secondary | ICD-10-CM

## 2020-01-23 DIAGNOSIS — H00012 Hordeolum externum right lower eyelid: Secondary | ICD-10-CM

## 2020-01-23 DIAGNOSIS — E871 Hypo-osmolality and hyponatremia: Secondary | ICD-10-CM | POA: Diagnosis present

## 2020-01-23 DIAGNOSIS — I69319 Unspecified symptoms and signs involving cognitive functions following cerebral infarction: Secondary | ICD-10-CM

## 2020-01-23 DIAGNOSIS — Z833 Family history of diabetes mellitus: Secondary | ICD-10-CM

## 2020-01-23 DIAGNOSIS — E876 Hypokalemia: Secondary | ICD-10-CM | POA: Diagnosis not present

## 2020-01-23 DIAGNOSIS — N1832 Chronic kidney disease, stage 3b: Secondary | ICD-10-CM | POA: Diagnosis present

## 2020-01-23 DIAGNOSIS — R634 Abnormal weight loss: Secondary | ICD-10-CM

## 2020-01-23 DIAGNOSIS — E785 Hyperlipidemia, unspecified: Secondary | ICD-10-CM | POA: Diagnosis present

## 2020-01-23 DIAGNOSIS — I509 Heart failure, unspecified: Secondary | ICD-10-CM

## 2020-01-23 DIAGNOSIS — G9341 Metabolic encephalopathy: Secondary | ICD-10-CM | POA: Diagnosis not present

## 2020-01-23 DIAGNOSIS — E870 Hyperosmolality and hypernatremia: Principal | ICD-10-CM | POA: Diagnosis present

## 2020-01-23 DIAGNOSIS — Z7901 Long term (current) use of anticoagulants: Secondary | ICD-10-CM

## 2020-01-23 DIAGNOSIS — Z20822 Contact with and (suspected) exposure to covid-19: Secondary | ICD-10-CM | POA: Diagnosis present

## 2020-01-23 DIAGNOSIS — E861 Hypovolemia: Secondary | ICD-10-CM | POA: Diagnosis present

## 2020-01-23 DIAGNOSIS — R1310 Dysphagia, unspecified: Secondary | ICD-10-CM | POA: Diagnosis present

## 2020-01-23 DIAGNOSIS — F0150 Vascular dementia without behavioral disturbance: Secondary | ICD-10-CM | POA: Diagnosis present

## 2020-01-23 DIAGNOSIS — H00015 Hordeolum externum left lower eyelid: Secondary | ICD-10-CM

## 2020-01-23 DIAGNOSIS — Z8261 Family history of arthritis: Secondary | ICD-10-CM

## 2020-01-23 DIAGNOSIS — D696 Thrombocytopenia, unspecified: Secondary | ICD-10-CM

## 2020-01-23 HISTORY — DX: Cerebral infarction, unspecified: I63.9

## 2020-01-23 HISTORY — DX: Chronic kidney disease, stage 3 unspecified: N18.30

## 2020-01-23 LAB — CBC
HCT: 57.3 % — ABNORMAL HIGH (ref 39.0–52.0)
Hemoglobin: 18.1 g/dL — ABNORMAL HIGH (ref 13.0–17.0)
MCH: 29.2 pg (ref 26.0–34.0)
MCHC: 31.6 g/dL (ref 30.0–36.0)
MCV: 92.6 fL (ref 80.0–100.0)
Platelets: 144 10*3/uL — ABNORMAL LOW (ref 150–400)
RBC: 6.19 MIL/uL — ABNORMAL HIGH (ref 4.22–5.81)
RDW: 12.5 % (ref 11.5–15.5)
WBC: 5.4 10*3/uL (ref 4.0–10.5)
nRBC: 0 % (ref 0.0–0.2)

## 2020-01-23 LAB — BASIC METABOLIC PANEL
Anion gap: 18 — ABNORMAL HIGH (ref 5–15)
BUN: 80 mg/dL — ABNORMAL HIGH (ref 6–20)
CO2: 26 mmol/L (ref 22–32)
Calcium: 10.5 mg/dL — ABNORMAL HIGH (ref 8.9–10.3)
Chloride: 108 mmol/L (ref 98–111)
Creatinine, Ser: 2.44 mg/dL — ABNORMAL HIGH (ref 0.61–1.24)
GFR, Estimated: 30 mL/min — ABNORMAL LOW (ref >60)
Glucose, Bld: 137 mg/dL — ABNORMAL HIGH (ref 70–99)
Potassium: 4 mmol/L (ref 3.5–5.1)
Sodium: 152 mmol/L — ABNORMAL HIGH (ref 135–145)

## 2020-01-23 IMAGING — CR DG CHEST 2V
2 series · 2 of 2 positions shown · non-contrast
Comparison: Radiograph [DATE]

CLINICAL DATA: Swallowing difficulties

EXAM:
CHEST - 2 VIEW

[chest lat]
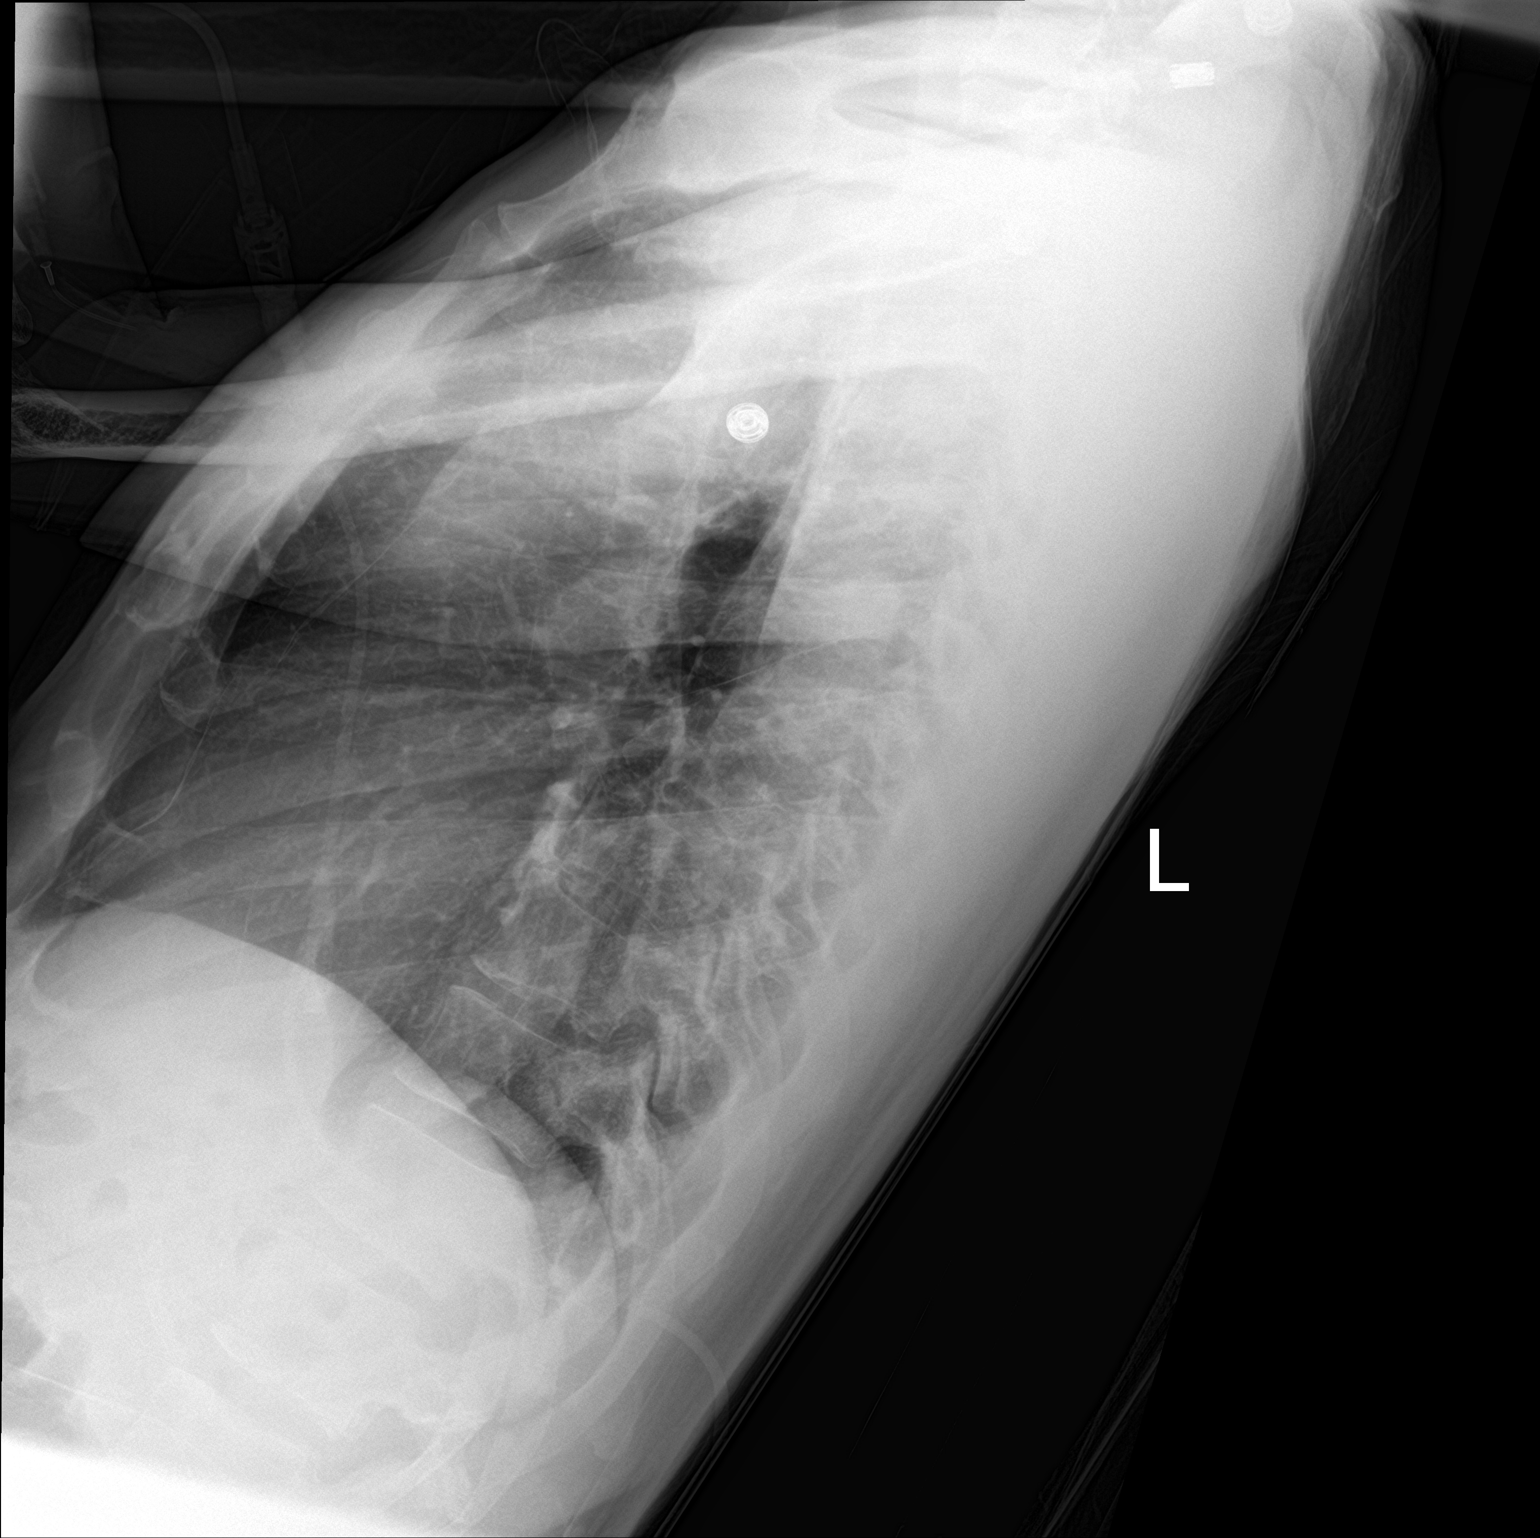

[chest ap]
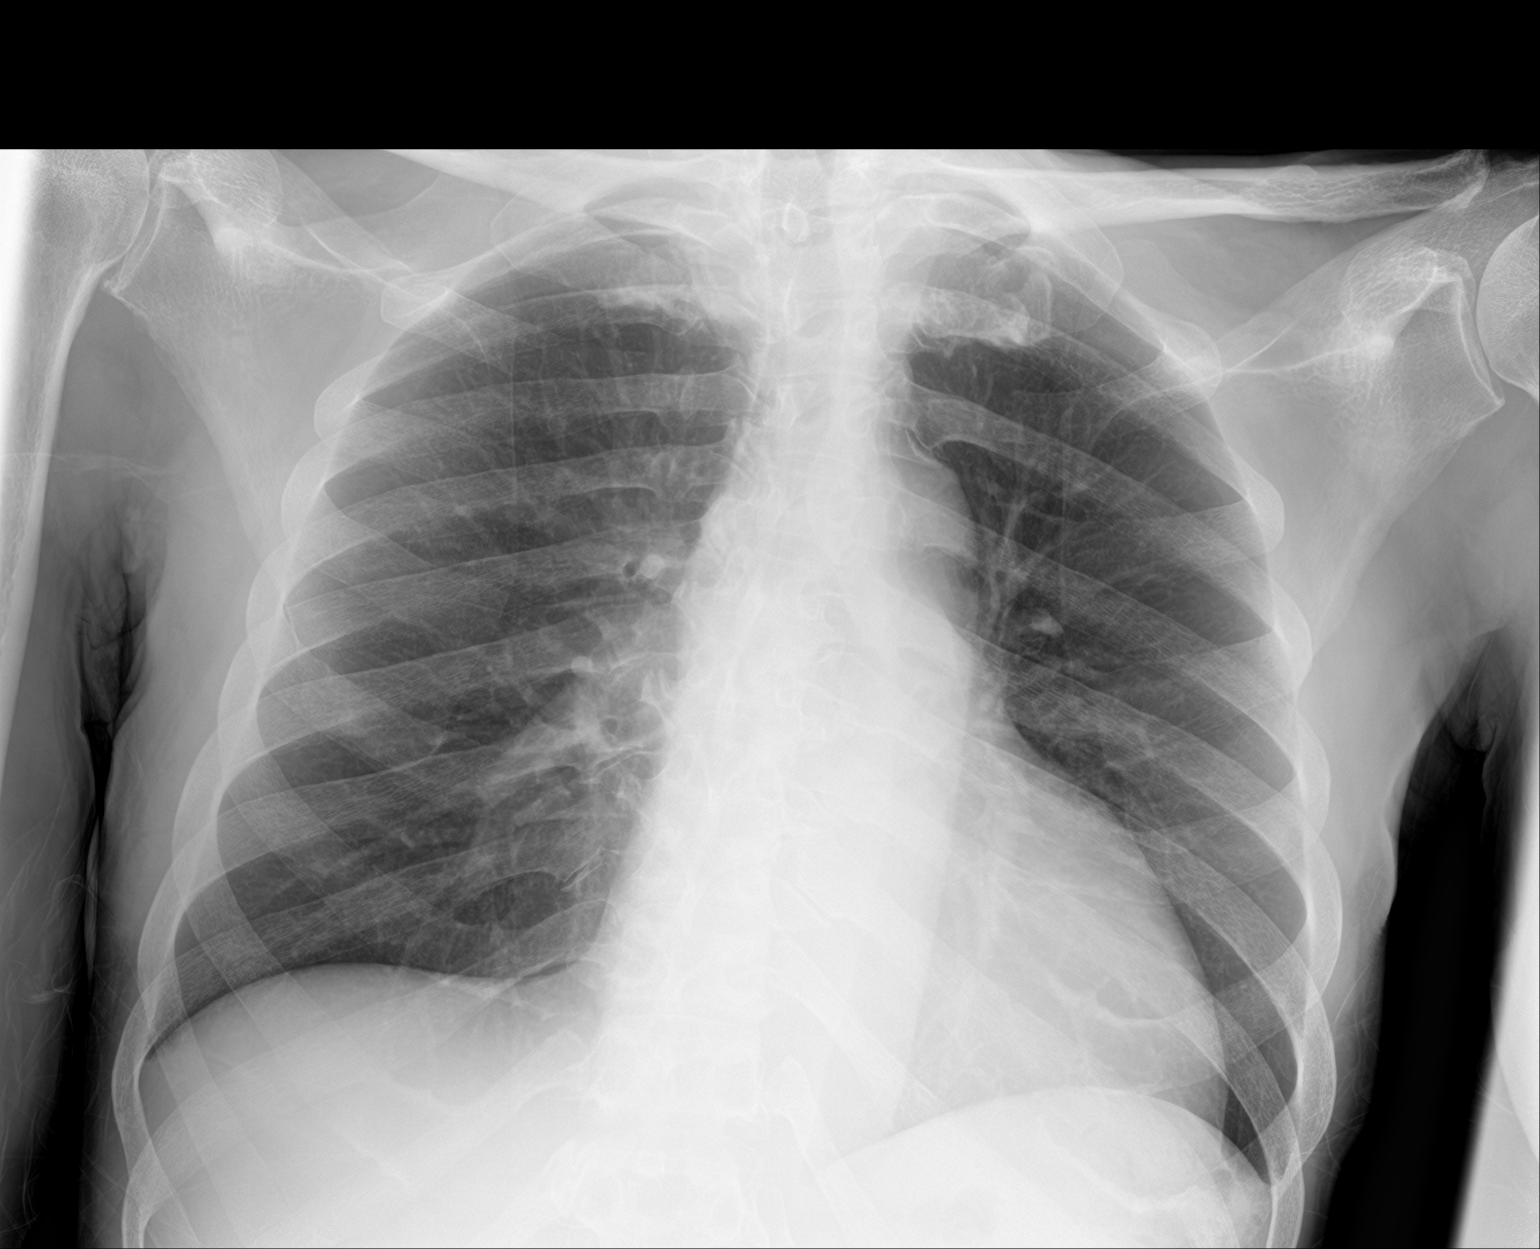

[2 of 2 positions shown; findings below may reference images not displayed]

FINDINGS: No consolidation, features of edema, pneumothorax, or effusion.
Tracheal air column is patent. No suspicious pulmonary nodules or
masses. The cardiomediastinal contours are unremarkable. No acute
osseous or soft tissue abnormality. Degenerative changes are present
in the imaged spine and shoulders.
IMPRESSION: No acute cardiopulmonary abnormality.

## 2020-01-23 MED ORDER — SODIUM CHLORIDE 0.9% FLUSH
3.0000 mL | Freq: Two times a day (BID) | INTRAVENOUS | Status: DC
  Administered 2020-01-24 – 2020-03-13 (×78): 3 mL via INTRAVENOUS

## 2020-01-23 MED ORDER — ATORVASTATIN CALCIUM 80 MG PO TABS
80.0000 mg | ORAL_TABLET | Freq: Every day | ORAL | Status: DC
  Administered 2020-01-24 – 2020-03-22 (×57): 80 mg via ORAL
  Filled 2020-01-23 (×39): qty 1
  Filled 2020-01-23: qty 8
  Filled 2020-01-23 (×19): qty 1

## 2020-01-23 MED ORDER — AMLODIPINE BESYLATE 10 MG PO TABS
10.0000 mg | ORAL_TABLET | Freq: Every day | ORAL | Status: DC
  Administered 2020-01-24 – 2020-03-26 (×61): 10 mg via ORAL
  Filled 2020-01-23 (×23): qty 1
  Filled 2020-01-23: qty 2
  Filled 2020-01-23 (×39): qty 1

## 2020-01-23 MED ORDER — INSULIN ASPART 100 UNIT/ML ~~LOC~~ SOLN
0.0000 [IU] | Freq: Three times a day (TID) | SUBCUTANEOUS | Status: DC
  Administered 2020-01-24 – 2020-01-25 (×3): 1 [IU] via SUBCUTANEOUS
  Administered 2020-01-26 – 2020-01-30 (×5): 2 [IU] via SUBCUTANEOUS
  Administered 2020-01-31 – 2020-02-01 (×4): 1 [IU] via SUBCUTANEOUS
  Administered 2020-02-03: 3 [IU] via SUBCUTANEOUS
  Administered 2020-02-03: 1 [IU] via SUBCUTANEOUS
  Administered 2020-02-04 – 2020-02-06 (×4): 2 [IU] via SUBCUTANEOUS
  Administered 2020-02-07: 3 [IU] via SUBCUTANEOUS
  Administered 2020-02-08: 2 [IU] via SUBCUTANEOUS
  Administered 2020-02-09 – 2020-02-10 (×3): 1 [IU] via SUBCUTANEOUS
  Administered 2020-02-11: 3 [IU] via SUBCUTANEOUS
  Administered 2020-02-12: 2 [IU] via SUBCUTANEOUS
  Administered 2020-02-12 – 2020-02-14 (×3): 1 [IU] via SUBCUTANEOUS
  Administered 2020-02-14: 2 [IU] via SUBCUTANEOUS
  Administered 2020-02-15: 1 [IU] via SUBCUTANEOUS

## 2020-01-23 MED ORDER — POLYETHYLENE GLYCOL 3350 17 G PO PACK
17.0000 g | PACK | Freq: Every day | ORAL | Status: DC | PRN
  Filled 2020-01-23: qty 1

## 2020-01-23 MED ORDER — LACTATED RINGERS IV BOLUS
1000.0000 mL | Freq: Once | INTRAVENOUS | Status: AC
  Administered 2020-01-23: 1000 mL via INTRAVENOUS

## 2020-01-23 MED ORDER — ACETAMINOPHEN 325 MG PO TABS
650.0000 mg | ORAL_TABLET | Freq: Four times a day (QID) | ORAL | Status: DC | PRN
  Administered 2020-01-25 – 2020-03-09 (×5): 650 mg via ORAL
  Filled 2020-01-23 (×5): qty 2

## 2020-01-23 MED ORDER — APIXABAN 5 MG PO TABS
5.0000 mg | ORAL_TABLET | Freq: Two times a day (BID) | ORAL | Status: DC
  Administered 2020-01-24 – 2020-03-26 (×124): 5 mg via ORAL
  Filled 2020-01-23 (×125): qty 1

## 2020-01-23 MED ORDER — ACETAMINOPHEN 650 MG RE SUPP: 650.0000 mg | Freq: Four times a day (QID) | RECTAL | Status: DC | PRN

## 2020-01-23 MED ORDER — SODIUM CHLORIDE 0.9 % IV SOLN: INTRAVENOUS | Status: DC

## 2020-01-23 NOTE — ED Triage Notes (Signed)
Pt arrives via POV bought by his mom from home, pt suffered a stroke around the week of 12/31/19 and was discharged home and has been unable to get into any rehab or therapy program due to issues with insurance. Per mom pt has been declining In the ability to do normal ADLs as well as not eating or drinking. Pt is able to walk but per mom gets off balance, denies any falls. Pt denies any pain. Pt just overall appears fail and dehydrated.

## 2020-01-23 NOTE — ED Provider Notes (Signed)
[facility] EMERGENCY DEPARTMENT Provider Note   CSN: [id number] Arrival date & time: 01/23/20  1702     History Chief Complaint  Patient presents with  . Failure To Thrive    Kevin [name] is a 55 y.o. male.  Patient was recently admitted for stroke, since then has had difficulty swallowing, losing significant amounts of weight.  Only drinking a few ounces of Ensure a day.  Family states the voices are much quieter he still suffers from confusion.  They are concerned for failure to thrive.  No recent illness or infection.  No recent trauma he denies chest pain shortness of breath, denies any other significant symptoms        Past Medical History:  Diagnosis Date  . Blind left eye   . CKD (chronic kidney disease), stage III (HCC)   . CVA (cerebral vascular accident) (HCC)   . Diabetes mellitus without complication (HCC)   . Hypertension     Patient Active Problem List   Diagnosis Date Noted  . Acute hypernatremia 01/24/2020  . Hypernatremia 01/23/2020  . Chronic CHF (congestive heart failure) (HCC) 01/23/2020  . CKD (chronic kidney disease), stage III (HCC) 01/08/2020  . Essential hypertension 01/05/2020  . Acute on chronic combined systolic and diastolic CHF (congestive heart failure) (HCC) 01/05/2020  . History of noncompliance with medical treatment 01/05/2020  . CVA (cerebral vascular accident) (HCC) 01/04/2020  . History of CVA (cerebrovascular accident) 01/04/2020  . Hypertensive emergency 11/29/2019  . Tobacco dependence 11/29/2019  . Marijuana abuse 11/29/2019  . Renal dysfunction 11/29/2019  . Hypokalemia 11/29/2019  . Diabetes mellitus without complication [facility])     Past Surgical History:  Procedure Laterality Date  . BUBBLE STUDY  01/07/2020   Procedure: BUBBLE STUDY;  Surgeon: Jake Bathe, MD;  Location: [facility] ENDOSCOPY;  Service: Cardiovascular;;  . GYNECOMASTIA EXCISION    . TEE WITHOUT CARDIOVERSION N/A 01/07/2020    Procedure: TRANSESOPHAGEAL ECHOCARDIOGRAM (TEE);  Surgeon: Jake Bathe, MD;  Location: [facility] ENDOSCOPY;  Service: Cardiovascular;  Laterality: N/A;       Family History  Problem Relation Age of Onset  . Diabetes Mother   . Hypertension Mother   . Hypertension Father   . Diabetes Maternal Grandmother   . Hypertension Maternal Grandmother   . Arthritis Maternal Grandmother   . Congestive Heart Failure Neg Hx     Social History   Tobacco Use  . Smoking status: Former Smoker    Packs/day: 2.00    Years: 40.00    Pack years: 80.00  . Smokeless tobacco: Never Used  Vaping Use  . Vaping Use: Former  Substance Use Topics  . Alcohol use: Yes    Comment: denies heavy use  . Drug use: Yes    Types: Marijuana    Home Medications Prior to Admission medications   Medication Sig Start Date End Date Taking? Authorizing Provider  amLODipine (NORVASC) 10 MG tablet Take 10 mg by mouth daily.   Yes [provider]  apixaban (ELIQUIS) 5 MG TABS tablet Take 1 tablet (5 mg total) by mouth 2 (two) times daily. 01/08/20  Yes Hollice Espy, MD  atorvastatin (LIPITOR) 80 MG tablet Take 1 tablet (80 mg total) by mouth daily. 01/09/20  Yes Hollice Espy, MD  hydrochlorothiazide (HYDRODIURIL) 25 MG tablet Take 25 mg by mouth 2 (two) times daily.   Yes [provider]  lisinopril (ZESTRIL) 20 MG tablet Take 20 mg by mouth daily. 11/08/19  Yes [provider]  metFORMIN (GLUCOPHAGE) 1000 MG tablet Take 1,000 mg by mouth in the morning and at bedtime.  02/02/17  Yes [provider]    Allergies    Patient has no known allergies.  Review of Systems   Review of Systems  Constitutional: Positive for unexpected weight change. Negative for chills and fever.  HENT: Positive for trouble swallowing. Negative for congestion and rhinorrhea.   Respiratory: Negative for cough and shortness of breath.   Cardiovascular: Negative for chest pain and palpitations.   Gastrointestinal: Negative for diarrhea, nausea and vomiting.  Genitourinary: Negative for difficulty urinating and dysuria.  Musculoskeletal: Negative for arthralgias and back pain.  Skin: Negative for color change and rash.  Neurological: Negative for light-headedness and headaches.    Physical Exam Updated Vital Signs BP (!) 148/94 (BP Location: Left Arm)   Pulse 65   Temp 98.5 F (36.9 C) (Oral)   Resp 16   SpO2 100%   Physical Exam Vitals and nursing note reviewed.  Constitutional:      General: He is not in acute distress.    Appearance: He is ill-appearing.  HENT:     Head: Normocephalic and atraumatic.     Nose: No rhinorrhea.  Eyes:     General:        Right eye: No discharge.        Left eye: No discharge.     Conjunctiva/sclera: Conjunctivae normal.  Cardiovascular:     Rate and Rhythm: Normal rate and regular rhythm.  Pulmonary:     Effort: Pulmonary effort is normal.     Breath sounds: No stridor.  Abdominal:     General: Abdomen is flat. There is no distension.     Palpations: Abdomen is soft.     Tenderness: There is no abdominal tenderness.  Musculoskeletal:        General: No deformity or signs of injury.  Skin:    General: Skin is warm and dry.  Neurological:     General: No focal deficit present.     Mental Status: He is alert. Mental status is at baseline.     Sensory: No sensory deficit.     Motor: No weakness.  Psychiatric:        Mood and Affect: Mood normal.        Behavior: Behavior normal.        Thought Content: Thought content normal.     ED Results / Procedures / Treatments   Labs (all labs ordered are listed, but only abnormal results are displayed) Labs Reviewed  BASIC METABOLIC PANEL - Abnormal; Notable for the following components:      Result Value   Sodium 152 (*)    Glucose, Bld 137 (*)    BUN 80 (*)    Creatinine, Ser 2.44 (*)    Calcium 10.5 (*)    GFR, Estimated 30 (*)    Anion gap 18 (*)    All other  components within normal limits  CBC - Abnormal; Notable for the following components:   RBC 6.19 (*)    Hemoglobin 18.1 (*)    HCT 57.3 (*)    Platelets 144 (*)    All other components within normal limits  COMPREHENSIVE METABOLIC PANEL - Abnormal; Notable for the following components:   Sodium 151 (*)    Glucose, Bld 155 (*)    BUN 86 (*)    Creatinine, Ser 2.67 (*)    GFR, Estimated 27 (*)  All other components within normal limits  CBC - Abnormal; Notable for the following components:   Platelets 113 (*)    All other components within normal limits  PROTIME-INR - Abnormal; Notable for the following components:   Prothrombin Time 15.5 (*)    INR 1.3 (*)    All other components within normal limits  GLUCOSE, CAPILLARY - Abnormal; Notable for the following components:   Glucose-Capillary 171 (*)    All other components within normal limits  CBG MONITORING, ED - Abnormal; Notable for the following components:   Glucose-Capillary 124 (*)    All other components within normal limits  SARS CORONAVIRUS 2 (TAT 6-24 HRS)  URINALYSIS, ROUTINE W REFLEX MICROSCOPIC  SODIUM  SODIUM    EKG None  Radiology DG Chest 2 View  Result Date: 01/23/2020 CLINICAL DATA:  Swallowing difficulties EXAM: CHEST - 2 VIEW COMPARISON:  Radiograph 01/04/2020 FINDINGS: No consolidation, features of edema, pneumothorax, or effusion. Tracheal air column is patent. No suspicious pulmonary nodules or masses. The cardiomediastinal contours are unremarkable. No acute osseous or soft tissue abnormality. Degenerative changes are present in the imaged spine and shoulders. IMPRESSION: No acute cardiopulmonary abnormality. Electronically Signed   By: Kreg ShropshirePrice  DeHay M.D.   On: 01/23/2020 22:45    Procedures Procedures (including critical care time)  Medications Ordered in ED Medications  atorvastatin (LIPITOR) tablet 80 mg (80 mg Oral Given 01/24/20 0942)  amLODipine (NORVASC) tablet 10 mg (10 mg Oral Given  01/24/20 0942)  apixaban (ELIQUIS) tablet 5 mg (5 mg Oral Given 01/24/20 0942)  sodium chloride flush (NS) 0.9 % injection 3 mL (3 mLs Intravenous Given 01/24/20 0942)  acetaminophen (TYLENOL) tablet 650 mg (has no administration in time range)    Or  acetaminophen (TYLENOL) suppository 650 mg (has no administration in time range)  polyethylene glycol (MIRALAX / GLYCOLAX) packet 17 g (has no administration in time range)  insulin aspart (novoLOG) injection 0-9 Units (1 Units Subcutaneous Given 01/24/20 0831)  hydrALAZINE (APRESOLINE) injection 10 mg (has no administration in time range)  0.45 % sodium chloride infusion ( Intravenous New Bag/Given 01/24/20 0830)  lactated ringers bolus 1,000 mL (0 mLs Intravenous Stopped 01/24/20 0005)    ED Course  I have reviewed the triage vital signs and the nursing notes.  Pertinent labs & imaging results that were available during my care of the patient were reviewed by me and considered in my medical decision making (see chart for details).    MDM Rules/Calculators/A&P                          Worsening weight loss, status post stroke.  Concern for hypernatremia acute kidney injury in the setting of poor nutrition we will give volume here.  Will get a COVID swab will get chest x-ray to evaluate for possible injury related to transesophageal echocardiogram that he had while admitted.  Family also has concerns for lack of resources on outpatient with regards to rehabilitation.  Patient may need a speech swallow study.  IV fluids given.  Will admit to the hospital.  Consulted the hospitalist for admission, they agree admission is warranted.  Patient and family agree.  Bolus of lactated Ringer's was given fluid resuscitation and hypernatremia.  Hopefully this will help treat AKI secondary to poor oral nutrition and hydration.  The patient will be admitted to the hospitalist.  For the remainder this patient's care please see inpatient team notes.  I will  intervene  as needed while the patient remains in the emergency department.   Final Clinical Impression(s) / ED Diagnoses Final diagnoses:  Hypernatremia  AKI (acute kidney injury) (HCC)  Weight loss  Dysphagia, unspecified type    Rx / DC Orders ED Discharge Orders    None       Sabino Donovan, MD 01/24/20 1440

## 2020-01-23 NOTE — H&P (Signed)
History and Physical   Kevin [name] [place] XIH:[id number] DOB: 07-Mar-1965 DOA: 01/23/2020  PCP: [facility]   Patient coming from: Home  Chief Complaint: Weakness, decline.  HPI: Kevin [name] is a 55 y.o. male with medical history significant of systolic and diastolic heart failure, CKD, CVA, diabetes, hypertension, left eye blindness who presents after generalized decline at home.  Patient family reports he has had generalized decline at home since his discharge following a CVA in December.  He was supposed to be set up with home health agency however he does not have insurance and the home health agency he was working with would not accept cash from the family.  He has had decreased p.o. intake family reports wincing when swallowing solids however he denies any pain with swallowing.  They have also noted a generalized decreased p.o. intake.  He has residual right-sided weakness and cognitive deficits following his stroke in December. Denies fever, cough, chills, chest pain, shortness of breath, abdominal pain, nausea, vomiting, constipation, diarrhea.  ED Course: Vital signs in ED significant for blood pressure in the 120s to 150s systolic.  Lab work-up showed BMP with sodium 152, creatinine 2.44 from baseline 1.5, gap 18, glucose 137.  Calcium 10.5.  CBC showed hemoglobin 18 and platelets 144.  Respiratory and for flu and COVID pending.  Patient started on IV fluids in the ED.  Review of Systems: As per HPI otherwise all other systems reviewed and are negative.  Past Medical History:  Diagnosis Date  . Blind left eye   . Diabetes mellitus without complication (HCC)   . Hypertension     Past Surgical History:  Procedure Laterality Date  . BUBBLE STUDY  01/07/2020   Procedure: BUBBLE STUDY;  Surgeon: Jake Bathe, MD;  Location: [facility] ENDOSCOPY;  Service: Cardiovascular;;  . GYNECOMASTIA EXCISION    . TEE WITHOUT CARDIOVERSION N/A 01/07/2020   Procedure:  TRANSESOPHAGEAL ECHOCARDIOGRAM (TEE);  Surgeon: Jake Bathe, MD;  Location: [facility] ENDOSCOPY;  Service: Cardiovascular;  Laterality: N/A;    Social History  reports that he has been smoking. He has a 80.00 pack-year smoking history. He has never used smokeless tobacco. He reports current alcohol use. He reports current drug use. Drug: Marijuana.  No Known Allergies  Family History  Problem Relation Age of Onset  . Diabetes Mother   . Hypertension Mother   . Hypertension Father   . Diabetes Maternal Grandmother   . Hypertension Maternal Grandmother   . Arthritis Maternal Grandmother   . Congestive Heart Failure Neg Hx   Reviewed on admission  Prior to Admission medications   Medication Sig Start Date End Date Taking? Authorizing Provider  amLODipine (NORVASC) 10 MG tablet Take 10 mg by mouth daily.    [provider]  apixaban (ELIQUIS) 5 MG TABS tablet Take 1 tablet (5 mg total) by mouth 2 (two) times daily. 01/08/20   Hollice Espy, MD  atorvastatin (LIPITOR) 80 MG tablet Take 1 tablet (80 mg total) by mouth daily. 01/09/20   Hollice Espy, MD  hydrochlorothiazide (HYDRODIURIL) 25 MG tablet Take 25 mg by mouth 2 (two) times daily.    [provider]  lisinopril (ZESTRIL) 20 MG tablet Take 20 mg by mouth daily. 11/08/19   [provider]  metFORMIN (GLUCOPHAGE) 1000 MG tablet Take 1,000 mg by mouth in the morning and at bedtime.  02/02/17   [provider]    Physical Exam: Vitals:   01/23/20 1721 01/23/20  1846 01/23/20 2000  BP: (!) 158/110 (!) 127/95 (!) 139/99  Pulse: 90 94 (!) 56  Resp: 16 19 17   Temp: 97.8 F (36.6 C)  98.8 F (37.1 C)  TempSrc: Oral  Oral  SpO2: 100% 99% 99%   Physical Exam Constitutional:      General: He is not in acute distress.    Appearance: Normal appearance.  HENT:     Head: Normocephalic and atraumatic.     Mouth/Throat:     Mouth: Mucous membranes are dry.     Pharynx: Oropharynx is clear.   Eyes:     Extraocular Movements: Extraocular movements intact.     Pupils: Pupils are equal, round, and reactive to light.  Cardiovascular:     Rate and Rhythm: Normal rate and regular rhythm.     Pulses: Normal pulses.     Heart sounds: Normal heart sounds.  Pulmonary:     Effort: Pulmonary effort is normal. No respiratory distress.     Breath sounds: Normal breath sounds.  Abdominal:     General: Bowel sounds are normal. There is no distension.     Palpations: Abdomen is soft.     Tenderness: There is no abdominal tenderness.  Musculoskeletal:        General: No swelling or deformity.  Skin:    General: Skin is warm and dry.  Neurological:     Mental Status: Mental status is at baseline.     Comments: Residual right-sided weakness and cognitive deficits    Labs on Admission: I have personally reviewed following labs and imaging studies  CBC: Recent Labs  Lab 01/23/20 1722  WBC 5.4  HGB 18.1*  HCT 57.3*  MCV 92.6  PLT 144*    Basic Metabolic Panel: Recent Labs  Lab 01/23/20 1722  NA 152*  K 4.0  CL 108  CO2 26  GLUCOSE 137*  BUN 80*  CREATININE 2.44*  CALCIUM 10.5*    GFR: CrCl cannot be calculated (Unknown ideal weight.).  Liver Function Tests: No results for input(s): AST, ALT, ALKPHOS, BILITOT, PROT, ALBUMIN in the last 168 hours.  Urine analysis:    Component Value Date/Time   COLORURINE STRAW (A) 01/04/2020 1339   APPEARANCEUR CLEAR 01/04/2020 1339   LABSPEC 1.011 01/04/2020 1339   PHURINE 7.0 01/04/2020 1339   GLUCOSEU NEGATIVE 01/04/2020 1339   HGBUR NEGATIVE 01/04/2020 1339   BILIRUBINUR NEGATIVE 01/04/2020 1339   KETONESUR 5 (A) 01/04/2020 1339   PROTEINUR NEGATIVE 01/04/2020 1339   NITRITE NEGATIVE 01/04/2020 1339   LEUKOCYTESUR NEGATIVE 01/04/2020 1339    Radiological Exams on Admission: DG Chest 2 View  Result Date: 01/23/2020 CLINICAL DATA:  Swallowing difficulties EXAM: CHEST - 2 VIEW COMPARISON:  Radiograph 01/04/2020  FINDINGS: No consolidation, features of edema, pneumothorax, or effusion. Tracheal air column is patent. No suspicious pulmonary nodules or masses. The cardiomediastinal contours are unremarkable. No acute osseous or soft tissue abnormality. Degenerative changes are present in the imaged spine and shoulders. IMPRESSION: No acute cardiopulmonary abnormality. Electronically Signed   By: 01/06/2020 M.D.   On: 01/23/2020 22:45    EKG: Not obtained in ED.  Assessment/Plan Principal Problem:   Hypernatremia Active Problems:   Diabetes mellitus without complication (HCC)   History of CVA (cerebrovascular accident)   Essential hypertension   CKD (chronic kidney disease), stage III (HCC)   Chronic CHF (congestive heart failure) (HCC)  Hyponatremia AKI on CKD 3 Dysphagia? > Sodium 152 and creatinine 2.44 from baseline 1.5.  In  the setting of decreased p.o. intake following discharge for stroke. > Family reports some grimacing with swallowing solids but okay with swallowing liquids however he has had overall decreased p.o. intake at home. > 1L bolus in ED. appears to be about 3.8 L down per calculations. - Continue IV fluids - Hold lisinopril hydrochlorothiazide - Avoid nephrotoxic agents - Trend renal function electrolytes  ?Dysphagia History of CVA > Admitted for CVA last month, discharged with home health however was able to obtain the services as he is uninsured and agency would not accept cash. > Residual deficits of right-sided weakness and cognitive deficits.  As above family has some concerns for dysphagia. - Continue statin and Eliquis - SLP, PT, OT eval and treat -Transitions of care consult  Hypertension Heart failure > History of systolic and diastolic heart failure chronically diuretics at home - Monitor ins and outs,  Daily weights - Hold lisinopril and hydrochlorothiazide in the setting of AKI as above - Continue amlodipine  Diabetes - SSI  Left eye blindness  DVT  prophylaxis: Eliquis Code Status:   Full  Family Communication:  Mother updated at bedside  Disposition Plan:   Patient is from:  Home  Anticipated DC to:  Pending evaluations  Anticipated DC date:  Pending clinical course  Anticipated DC barriers: Uninsured  Consults called:  None Admission status:  Observation, telemetry   Severity of Illness: The appropriate patient status for this patient is OBSERVATION. Observation status is judged to be reasonable and necessary in order to provide the required intensity of service to ensure the patient's safety. The patient's presenting symptoms, physical exam findings, and initial radiographic and laboratory data in the context of their medical condition is felt to place them at decreased risk for further clinical deterioration. Furthermore, it is anticipated that the patient will be medically stable for discharge from the hospital within 2 midnights of admission. The following factors support the patient status of observation.   " The patient's presenting symptoms include generalized weakness and decline. " The physical exam findings include residual right-sided weakness and cognitive deficits. " The initial radiographic and laboratory data are concerning for AKI and hypernatremia.   Synetta Fail MD [facility]  How to contact the [facility] Attending or Consulting provider 7A - 7P or covering provider during after hours 7P -7A, for this patient?   1. Check the care team in [facility] and look for a) attending/consulting [facility] provider listed and b) the [facility] team listed 2. Log into [email or web address] and use [place]'s universal password to access. If you do not have the password, please contact the hospital operator. 3. Locate the [facility] provider you are looking for under [facility] and page to a number that you can be directly reached. 4. If you still have difficulty reaching the provider, please page the [facility] (Director on Call) for the  Hospitalists listed on amion for assistance.  01/23/2020, 10:51 PM

## 2020-01-24 ENCOUNTER — Other Ambulatory Visit: Payer: Self-pay

## 2020-01-24 ENCOUNTER — Encounter (HOSPITAL_COMMUNITY): Payer: Self-pay | Admitting: Family Medicine

## 2020-01-24 DIAGNOSIS — G9341 Metabolic encephalopathy: Secondary | ICD-10-CM | POA: Diagnosis not present

## 2020-01-24 DIAGNOSIS — I130 Hypertensive heart and chronic kidney disease with heart failure and stage 1 through stage 4 chronic kidney disease, or unspecified chronic kidney disease: Secondary | ICD-10-CM | POA: Diagnosis present

## 2020-01-24 DIAGNOSIS — N179 Acute kidney failure, unspecified: Secondary | ICD-10-CM | POA: Diagnosis present

## 2020-01-24 DIAGNOSIS — Z20822 Contact with and (suspected) exposure to covid-19: Secondary | ICD-10-CM | POA: Diagnosis present

## 2020-01-24 DIAGNOSIS — E1122 Type 2 diabetes mellitus with diabetic chronic kidney disease: Secondary | ICD-10-CM | POA: Diagnosis present

## 2020-01-24 DIAGNOSIS — E861 Hypovolemia: Secondary | ICD-10-CM | POA: Diagnosis present

## 2020-01-24 DIAGNOSIS — F0280 Dementia in other diseases classified elsewhere without behavioral disturbance: Secondary | ICD-10-CM | POA: Diagnosis present

## 2020-01-24 DIAGNOSIS — R1310 Dysphagia, unspecified: Secondary | ICD-10-CM | POA: Diagnosis present

## 2020-01-24 DIAGNOSIS — F0150 Vascular dementia without behavioral disturbance: Secondary | ICD-10-CM | POA: Diagnosis present

## 2020-01-24 DIAGNOSIS — I5042 Chronic combined systolic (congestive) and diastolic (congestive) heart failure: Secondary | ICD-10-CM | POA: Diagnosis present

## 2020-01-24 DIAGNOSIS — D61818 Other pancytopenia: Secondary | ICD-10-CM | POA: Diagnosis not present

## 2020-01-24 DIAGNOSIS — H00015 Hordeolum externum left lower eyelid: Secondary | ICD-10-CM | POA: Diagnosis present

## 2020-01-24 DIAGNOSIS — H00012 Hordeolum externum right lower eyelid: Secondary | ICD-10-CM | POA: Diagnosis present

## 2020-01-24 DIAGNOSIS — I69351 Hemiplegia and hemiparesis following cerebral infarction affecting right dominant side: Secondary | ICD-10-CM | POA: Diagnosis not present

## 2020-01-24 DIAGNOSIS — N1832 Chronic kidney disease, stage 3b: Secondary | ICD-10-CM | POA: Diagnosis present

## 2020-01-24 DIAGNOSIS — E785 Hyperlipidemia, unspecified: Secondary | ICD-10-CM | POA: Diagnosis present

## 2020-01-24 DIAGNOSIS — E870 Hyperosmolality and hypernatremia: Secondary | ICD-10-CM | POA: Diagnosis present

## 2020-01-24 DIAGNOSIS — E860 Dehydration: Secondary | ICD-10-CM | POA: Diagnosis present

## 2020-01-24 DIAGNOSIS — H5462 Unqualified visual loss, left eye, normal vision right eye: Secondary | ICD-10-CM | POA: Diagnosis present

## 2020-01-24 DIAGNOSIS — E876 Hypokalemia: Secondary | ICD-10-CM | POA: Diagnosis not present

## 2020-01-24 DIAGNOSIS — E871 Hypo-osmolality and hyponatremia: Secondary | ICD-10-CM | POA: Diagnosis present

## 2020-01-24 DIAGNOSIS — R627 Adult failure to thrive: Secondary | ICD-10-CM | POA: Diagnosis present

## 2020-01-24 DIAGNOSIS — H00011 Hordeolum externum right upper eyelid: Secondary | ICD-10-CM | POA: Diagnosis present

## 2020-01-24 LAB — SARS CORONAVIRUS 2 (TAT 6-24 HRS): SARS Coronavirus 2: NEGATIVE

## 2020-01-24 LAB — COMPREHENSIVE METABOLIC PANEL
ALT: 10 U/L (ref 0–44)
AST: 17 U/L (ref 15–41)
Albumin: 3.8 g/dL (ref 3.5–5.0)
Alkaline Phosphatase: 60 U/L (ref 38–126)
Anion gap: 12 (ref 5–15)
BUN: 86 mg/dL — ABNORMAL HIGH (ref 6–20)
CO2: 29 mmol/L (ref 22–32)
Calcium: 9.7 mg/dL (ref 8.9–10.3)
Chloride: 110 mmol/L (ref 98–111)
Creatinine, Ser: 2.67 mg/dL — ABNORMAL HIGH (ref 0.61–1.24)
GFR, Estimated: 27 mL/min — ABNORMAL LOW (ref >60)
Glucose, Bld: 155 mg/dL — ABNORMAL HIGH (ref 70–99)
Potassium: 4.4 mmol/L (ref 3.5–5.1)
Sodium: 151 mmol/L — ABNORMAL HIGH (ref 135–145)
Total Bilirubin: 1.1 mg/dL (ref 0.3–1.2)
Total Protein: 7.3 g/dL (ref 6.5–8.1)

## 2020-01-24 LAB — PROTIME-INR
INR: 1.3 — ABNORMAL HIGH (ref 0.8–1.2)
Prothrombin Time: 15.5 seconds — ABNORMAL HIGH (ref 11.4–15.2)

## 2020-01-24 LAB — CBC
HCT: 47.2 % (ref 39.0–52.0)
Hemoglobin: 15.3 g/dL (ref 13.0–17.0)
MCH: 29.8 pg (ref 26.0–34.0)
MCHC: 32.4 g/dL (ref 30.0–36.0)
MCV: 91.8 fL (ref 80.0–100.0)
Platelets: 113 10*3/uL — ABNORMAL LOW (ref 150–400)
RBC: 5.14 MIL/uL (ref 4.22–5.81)
RDW: 12.5 % (ref 11.5–15.5)
WBC: 5 10*3/uL (ref 4.0–10.5)
nRBC: 0 % (ref 0.0–0.2)

## 2020-01-24 LAB — GLUCOSE, CAPILLARY
Glucose-Capillary: 171 mg/dL — ABNORMAL HIGH (ref 70–99)
Glucose-Capillary: 132 mg/dL — ABNORMAL HIGH (ref 70–99)
Glucose-Capillary: 111 mg/dL — ABNORMAL HIGH (ref 70–99)

## 2020-01-24 LAB — CBG MONITORING, ED: Glucose-Capillary: 124 mg/dL — ABNORMAL HIGH (ref 70–99)

## 2020-01-24 LAB — SODIUM
Sodium: 150 mmol/L — ABNORMAL HIGH (ref 135–145)
Sodium: 151 mmol/L — ABNORMAL HIGH (ref 135–145)

## 2020-01-24 MED ORDER — SODIUM CHLORIDE 0.45 % IV SOLN: INTRAVENOUS | Status: DC

## 2020-01-24 MED ORDER — HYDRALAZINE HCL 20 MG/ML IJ SOLN: 10.0000 mg | Freq: Four times a day (QID) | INTRAMUSCULAR | Status: DC | PRN

## 2020-01-24 NOTE — Progress Notes (Signed)
OT Cancellation    01/24/20 1431  OT Visit Information  Last OT Received On 01/24/20  Reason Eval/Treat Not Completed Other (comment) (Pt being transferred from ED to floor. Will follow up later time.)  Luisa Dago, OT/L   Acute OT Clinical Specialist Acute Rehabilitation Services Pager [phone] Office [phone]

## 2020-01-24 NOTE — Progress Notes (Signed)
PROGRESS NOTE    Kevin [name] [facility]  ZGY:[id number] DOB: 13-Jul-1965 DOA: 01/23/2020 PCP: Medicine, [facility]   Brief Narrative:  HPI: Kevin [name] is a 55 y.o. male with medical history significant of systolic and diastolic heart failure, CKD, CVA, diabetes, hypertension, left eye blindness who presents after generalized decline at home.             Patient family reports he has had generalized decline at home since his discharge following a CVA in December.  He was supposed to be set up with home health agency however he does not have insurance and the home health agency he was working with would not accept cash from the family.  He has had decreased p.o. intake family reports wincing when swallowing solids however he denies any pain with swallowing.  They have also noted a generalized decreased p.o. intake.  He has residual right-sided weakness and cognitive deficits following his stroke in December. Denies fever, cough, chills, chest pain, shortness of breath, abdominal pain, nausea, vomiting, constipation, diarrhea.  ED Course: Vital signs in ED significant for blood pressure in the 120s to 150s systolic.  Lab work-up showed BMP with sodium 152, creatinine 2.44 from baseline 1.5, gap 18, glucose 137.  Calcium 10.5.  CBC showed hemoglobin 18 and platelets 144.  Respiratory and for flu and COVID pending.  Patient started on IV fluids in the ED.  Assessment & Plan:   Principal Problem:   Hypernatremia Active Problems:   Diabetes mellitus without complication (HCC)   History of CVA (cerebrovascular accident)   Essential hypertension   CKD (chronic kidney disease), stage III (HCC)   Chronic CHF (congestive heart failure) (HCC)   Hypovolemic hypernatremia: Presented with sodium of 152 which has improved to only 151.  Currently on normal saline fluids.  I will switch that to half-normal saline at 125 cc/h and monitor sodium every 6 hours.  Dysphagia: Concern of dysphagia by  family.  SLP consulted.  He had CVA last month.  Essential hypertension: Blood pressure slightly elevated.  Amlodipine has been resumed.  Hydrochlorothiazide and lisinopril on hold due to AKI.  We will place him on as needed hydralazine.  Type 2 diabetes mellitus: Takes metformin at home which is on hold.  Continue SSI.  History of systolic and diastolic heart failure: Chronically on diuretics, looks dehydrated.  It appears that he is not on any diuretics.  AKI on CKD stage IIIb: Patient's creatinine last month at the time of discharge was 1.9 with GFR of 41.  Currently creatinine 2.4 which worsened slightly today.  Likely prerenal due to dehydration.  Continue IV fluids and monitor labs daily.  Generalized weakness: Patient seems to be very slow in response: PT OT consulted.  Recent history of CVA: Admitted for CVA last month, discharged with home health however was able to obtain the services as he is uninsured and agency would not accept cash. > Residual deficits of right-sided weakness and cognitive deficits.  As above family has some concerns for dysphagia. Continue statin and Eliquis - SLP, PT, OT eval and treat -Transitions of care consult  DVT prophylaxis:    Code Status: Full Code  Family Communication: None present at bedside.  Tried calling his father at [phone], unable to connect.  Status is: Observation  The patient will require care spanning > 2 midnights and should be moved to inpatient because: Persistent severe electrolyte disturbances  Dispo: The patient is from: Home  Anticipated d/c is to: SNF              Anticipated d/c date is: 2 days              Patient currently is not medically stable to d/c.        Estimated body mass index is 20.72 kg/m as calculated from the following:   Height as of 01/07/20: 6\' 2"  (1.88 m).   Weight as of 01/08/20: 73.2 kg.      Nutritional status:               Consultants:   None  Procedures:    None  Antimicrobials:  Anti-infectives (From admission, onward)   None         Subjective: Patient seen and examined, still in the ED.  He is very slow in response however he was able to tell me that he does not have any complaint.  He was able to tell me his name but other than that, he thought that he is in the psychiatry unit and could not answer any other questions regarding orientation.  He was alert throughout.  Objective: Vitals:   01/24/20 0615 01/24/20 0630 01/24/20 0645 01/24/20 0834  BP: (!) 149/90 (!) 141/92 (!) 148/92 (!) 162/88  Pulse: (!) 52 (!) 54 (!) 55 (!) 54  Resp: 11 (!) 21 11 14   Temp:    98.2 F (36.8 C)  TempSrc:    Oral  SpO2: 98% 99% 99% 98%   No intake or output data in the 24 hours ending 01/24/20 0926 There were no vitals filed for this visit.  Examination:  General exam: Appears calm and comfortable  Respiratory system: Clear to auscultation. Respiratory effort normal. Cardiovascular system: S1 & S2 heard, RRR. No JVD, murmurs, rubs, gallops or clicks. No pedal edema. Gastrointestinal system: Abdomen is nondistended, soft and nontender. No organomegaly or masses felt. Normal bowel sounds heard. Central nervous system: Alert but oriented to himself only.  Slightly decreased strength in right upper extremity.  Data Reviewed: I have personally reviewed following labs and imaging studies  CBC: Recent Labs  Lab 01/23/20 1722 01/24/20 0500  WBC 5.4 5.0  HGB 18.1* 15.3  HCT 57.3* 47.2  MCV 92.6 91.8  PLT 144* 113*   Basic Metabolic Panel: Recent Labs  Lab 01/23/20 1722 01/24/20 0500  NA 152* 151*  K 4.0 4.4  CL 108 110  CO2 26 29  GLUCOSE 137* 155*  BUN 80* 86*  CREATININE 2.44* 2.67*  CALCIUM 10.5* 9.7   GFR: CrCl cannot be calculated (Unknown ideal weight.). Liver Function Tests: Recent Labs  Lab 01/24/20 0500  AST 17  ALT 10  ALKPHOS 60  BILITOT 1.1  PROT 7.3  ALBUMIN 3.8   No results for input(s): LIPASE, AMYLASE  in the last 168 hours. No results for input(s): AMMONIA in the last 168 hours. Coagulation Profile: Recent Labs  Lab 01/24/20 0500  INR 1.3*   Cardiac Enzymes: No results for input(s): CKTOTAL, CKMB, CKMBINDEX, TROPONINI in the last 168 hours. BNP (last 3 results) No results for input(s): PROBNP in the last 8760 hours. HbA1C: No results for input(s): HGBA1C in the last 72 hours. CBG: Recent Labs  Lab 01/24/20 0819  GLUCAP 124*   Lipid Profile: No results for input(s): CHOL, HDL, LDLCALC, TRIG, CHOLHDL, LDLDIRECT in the last 72 hours. Thyroid Function Tests: No results for input(s): TSH, T4TOTAL, FREET4, T3FREE, THYROIDAB in the last 72 hours. Anemia Panel: No results  for input(s): VITAMINB12, FOLATE, FERRITIN, TIBC, IRON, RETICCTPCT in the last 72 hours. Sepsis Labs: No results for input(s): PROCALCITON, LATICACIDVEN in the last 168 hours.  Recent Results (from the past 240 hour(s))  SARS CORONAVIRUS 2 (TAT 6-24 HRS) Nasopharyngeal Nasopharyngeal Swab     Status: None   Collection Time: 01/23/20  9:47 PM   Specimen: Nasopharyngeal Swab  Result Value Ref Range Status   SARS Coronavirus 2 NEGATIVE NEGATIVE Final    Comment: (NOTE) SARS-CoV-2 target nucleic acids are NOT DETECTED.  The SARS-CoV-2 RNA is generally detectable in upper and lower respiratory specimens during the acute phase of infection. Negative results do not preclude SARS-CoV-2 infection, do not rule out co-infections with other pathogens, and should not be used as the sole basis for treatment or other patient management decisions. Negative results must be combined with clinical observations, patient history, and epidemiological information. The expected result is Negative.  Fact Sheet for Patients: HairSlick.no  Fact Sheet for Healthcare Providers: quierodirigir.com  This test is not yet approved or cleared by the [place] FDA and  has been  authorized for detection and/or diagnosis of SARS-CoV-2 by FDA under an Emergency Use Authorization (EUA). This EUA will remain  in effect (meaning this test can be used) for the duration of the COVID-19 declaration under Se ction 564(b)(1) of the Act, 21 U.S.C. section 360bbb-3(b)(1), unless the authorization is terminated or revoked sooner.  Performed at [facility], [address] [place], [place] [address]       Radiology Studies: DG Chest 2 View  Result Date: 01/23/2020 CLINICAL DATA:  Swallowing difficulties EXAM: CHEST - 2 VIEW COMPARISON:  Radiograph 01/04/2020 FINDINGS: No consolidation, features of edema, pneumothorax, or effusion. Tracheal air column is patent. No suspicious pulmonary nodules or masses. The cardiomediastinal contours are unremarkable. No acute osseous or soft tissue abnormality. Degenerative changes are present in the imaged spine and shoulders. IMPRESSION: No acute cardiopulmonary abnormality. Electronically Signed   By: Kreg Shropshire M.D.   On: 01/23/2020 22:45    Scheduled Meds: . amLODipine  10 mg Oral Daily  . apixaban  5 mg Oral BID  . atorvastatin  80 mg Oral Daily  . insulin aspart  0-9 Units Subcutaneous TID WC  . sodium chloride flush  3 mL Intravenous Q12H   Continuous Infusions: . sodium chloride 125 mL/hr at 01/24/20 0830     LOS: 0 days   Time spent: 32 minutes   Hughie Closs, MD [facility]  01/24/2020, 9:26 AM   To contact the attending provider between 7A-7P or the covering provider during after hours 7P-7A, please log into the web site [email or web address].

## 2020-01-24 NOTE — ED Notes (Signed)
Lunch Tray Ordered @ 1022. 

## 2020-01-24 NOTE — Evaluation (Signed)
Clinical/Bedside Swallow Evaluation Patient Details  Name: Kevin [name] [place]. MRN: [id number] Date of Birth: Apr 22, 1965  Today's Date: 01/24/2020 Time: SLP Start Time (ACUTE ONLY): 1430 SLP Stop Time (ACUTE ONLY): 1449 SLP Time Calculation (min) (ACUTE ONLY): 19 min  Past Medical History:  Past Medical History:  Diagnosis Date  . Blind left eye   . CKD (chronic kidney disease), stage III (HCC)   . CVA (cerebral vascular accident) (HCC)   . Diabetes mellitus without complication (HCC)   . Hypertension    Past Surgical History:  Past Surgical History:  Procedure Laterality Date  . BUBBLE STUDY  01/07/2020   Procedure: BUBBLE STUDY;  Surgeon: Jake Bathe, MD;  Location[facility] ENDOSCOPY;  Service: Cardiovascular;;  . GYNECOMASTIA EXCISION    . TEE WITHOUT CARDIOVERSION N/A 01/07/2020   Procedure: TRANSESOPHAGEAL ECHOCARDIOGRAM (TEE);  Surgeon: Jake Bathe, MD;  Location: [facility] ENDOSCOPY;  Service: Cardiovascular;  Laterality: N/A;   HPI:  Pt is a 55 y.o. male with medical history significant of systolic and diastolic heart failure, CKD, CVA with cognitive impairment, diabetes, hypertension, left eye blindness who presented secondary to weakness and generalized decline at home. On admission, pt's family reported decreased p.o. intake and wincing when swallowing solids though he has denied odynophagia. CXR 1/12 was negative.   Assessment / Plan / Recommendation Clinical Impression  Pt was seen for bedside swallow evaluation and he denied a history of dysphagia, but his reliability as a historian is questioned given his documented impairments in language in cognition from speech/language evaluation on 01/08/20. Oral mechanism exam was limited due to pt's participation; however, oral motor strength and ROM appeared grossly WFL. Dentition was reduced, but adequate for mastication. He tolerated all solids and liquids without signs or symptoms of oropharyngeal dysphagia. He denied odynophagia and  no symptoms were noted thereof. Pt's RN and the pt reported that the pt consumes a soft diet at home. A dysphagia 3 diet with thin liquids will be initiated at this time and SLP will follow briefly to ensure diet tolerance. SLP Visit Diagnosis: Dysphagia, unspecified (R13.10)    Aspiration Risk  Mild aspiration risk    Diet Recommendation Dysphagia 3 (Mech soft);Thin liquid   Liquid Administration via: Cup;Straw Medication Administration: Whole meds with liquid Supervision: Staff to assist with self feeding Compensations: Slow rate;Small sips/bites Postural Changes: Seated upright at 90 degrees    Other  Recommendations Oral Care Recommendations: Oral care BID   Follow up Recommendations Home health SLP      Frequency and Duration min 1 x/week  1 week       Prognosis Prognosis for Safe Diet Advancement: Good      Swallow Study   General Date of Onset: 01/23/20 HPI: Pt is a 55 y.o. male with medical history significant of systolic and diastolic heart failure, CKD, CVA with cognitive impairment, diabetes, hypertension, left eye blindness who presented secondary to weakness and generalized decline at home. On admission, pt's family reported decreased p.o. intake and wincing when swallowing solids though he has denied odynophagia. CXR 1/12 was negative. Type of Study: Bedside Swallow Evaluation Diet Prior to this Study: Thin liquids (full liquids) Temperature Spikes Noted: No Respiratory Status: Room air History of Recent Intubation: No Behavior/Cognition: Alert;Cooperative;Pleasant mood Oral Cavity Assessment: Within Functional Limits Oral Care Completed by SLP: No Oral Cavity - Dentition: Adequate natural dentition Vision: Functional for self-feeding Self-Feeding Abilities: Able to feed self Patient Positioning: Upright in bed;Postural control adequate for testing Baseline Vocal Quality: Normal  Volitional Cough: Cognitively unable to elicit Volitional Swallow: Able to  elicit    Oral/Motor/Sensory Function Overall Oral Motor/Sensory Function: Within functional limits   Ice Chips Ice chips: Within functional limits Presentation: Spoon   Thin Liquid Thin Liquid: Within functional limits Presentation: Straw;Cup;Self Fed    Nectar Thick Nectar Thick Liquid: Not tested   Honey Thick Honey Thick Liquid: Not tested   Puree Puree: Within functional limits Presentation: Spoon   Solid     Solid: Within functional limits Presentation: Self Fed     Kevin [name] I. Kevin Clock, MS, CCC-SLP Acute Rehabilitation Services Office number [phone] Pager [phone]  Kevin [name] 01/24/2020,3:01 PM

## 2020-01-24 NOTE — ED Notes (Signed)
Pt's HOB lowered per request, provided warm blanket. Call light within reach, no additional requests/needs expressed

## 2020-01-24 NOTE — Evaluation (Signed)
Physical Therapy Evaluation Patient Details Name: Kevin [name] MRN: [id number] DOB: 03/22/1965 Today's Date: 01/24/2020   History of Present Illness  Pt is a 55 y/o male admitted secondary to weakness, generalized decline at home, and hyponatremia. PMH includes L eye blindness, DM, CVA, HTN, CHF, and CKD.  Clinical Impression  Pt admitted secondary to problem above with deficits below. Pt slow to respond and requiring increased time to follow commands. Awake throughout, however, minimally interactive. Requiring mod A to stand and was unable to sequence to take steps. Unsure of PLOF as no family present. Feel pt would benefit from SNF level therapies if not close to baseline. If pt progress well, may be able to consider home with HHPT and 24/7 support. Will continue to follow acutely.     Follow Up Recommendations SNF;Supervision/Assistance - 24 hour (vs HHPT pending progression)    Equipment Recommendations  Other (comment) (TBD)    Recommendations for Other Services       Precautions / Restrictions Precautions Precautions: Fall Restrictions Weight Bearing Restrictions: No      Mobility  Bed Mobility Overal bed mobility: Needs Assistance Bed Mobility: Supine to Sit;Sit to Supine     Supine to sit: Min assist Sit to supine: Min assist   General bed mobility comments: Min A for LE and trunk assist. Increased time required.    Transfers Overall transfer level: Needs assistance Equipment used: None Transfers: Sit to/from Stand Sit to Stand: Mod assist         General transfer comment: Mod A for steadying. Upon standing, pt with posterior LOB and required support to maintain. Attempted to take steps at EOB, however, pt unable to sequence to take steps and with difficulty moving feet.  Ambulation/Gait                Stairs            Wheelchair Mobility    Modified Rankin (Stroke Patients Only)       Balance Overall balance assessment: Needs  assistance Sitting-balance support: No upper extremity supported;Feet supported Sitting balance-Leahy Scale: Fair     Standing balance support: No upper extremity supported Standing balance-Leahy Scale: Poor Standing balance comment: Reliant on external support                             Pertinent Vitals/Pain Pain Assessment: Faces Faces Pain Scale: No hurt    Home Living Family/patient expects to be discharged to:: Private residence Living Arrangements: Parent Available Help at Discharge: Family;Available 24 hours/day Type of Home: House Home Access: Level entry     Home Layout: One level Home Equipment: None Additional Comments: Information gotten from previous admission as pt unable to report.    Prior Function           Comments: Unsure of baseline. Pt reporting he is independent, but no family present.     Hand Dominance        Extremity/Trunk Assessment   Upper Extremity Assessment Upper Extremity Assessment: Defer to OT evaluation    Lower Extremity Assessment Lower Extremity Assessment: Generalized weakness    Cervical / Trunk Assessment Cervical / Trunk Assessment: Normal  Communication   Communication: Expressive difficulties (minimally interactive and slow to respond)  Cognition Arousal/Alertness: Awake/alert Behavior During Therapy: Flat affect Overall Cognitive Status: No family/caregiver present to determine baseline cognitive functioning  General Comments: Pt very slow to respond. Followed one step commands with increased time. Cues for sequencing.      General Comments      Exercises     Assessment/Plan    PT Assessment Patient needs continued PT services  PT Problem List Decreased strength;Decreased activity tolerance;Decreased balance;Decreased mobility;Decreased coordination;Decreased knowledge of use of DME;Decreased safety awareness;Decreased knowledge of  precautions;Decreased cognition       PT Treatment Interventions DME instruction;Gait training;Stair training;Functional mobility training;Therapeutic activities;Therapeutic exercise;Balance training;Neuromuscular re-education;Cognitive remediation;Patient/family education    PT Goals (Current goals can be found in the Care Plan section)  Acute Rehab PT Goals PT Goal Formulation: Patient unable to participate in goal setting Time For Goal Achievement: 02/07/20 Potential to Achieve Goals: Fair    Frequency Min 2X/week   Barriers to discharge Other (comment) unsure of caregiver support    Co-evaluation               AM-PAC PT "6 Clicks" Mobility  Outcome Measure Help needed turning from your back to your side while in a flat bed without using bedrails?: A Little Help needed moving from lying on your back to sitting on the side of a flat bed without using bedrails?: A Little Help needed moving to and from a bed to a chair (including a wheelchair)?: A Lot Help needed standing up from a chair using your arms (e.g., wheelchair or bedside chair)?: A Lot Help needed to walk in hospital room?: A Lot Help needed climbing 3-5 steps with a railing? : A Lot 6 Click Score: 14    End of Session Equipment Utilized During Treatment: Gait belt Activity Tolerance: Patient tolerated treatment well Patient left: in bed;with call bell/phone within reach;with bed alarm set Nurse Communication: Mobility status PT Visit Diagnosis: Unsteadiness on feet (R26.81);Muscle weakness (generalized) (M62.81)    Time: 6659-9357 PT Time Calculation (min) (ACUTE ONLY): 13 min   Charges:   PT Evaluation $PT Eval Moderate Complexity: 1 Mod          Farley Ly, PT, DPT  Acute Rehabilitation Services  Pager: [phone] Office: [phone]   Lehman Prom 01/24/2020, 5:47 PM

## 2020-01-24 NOTE — ED Notes (Signed)
Breakfast Ordered 

## 2020-01-24 NOTE — Care Management (Signed)
Went to do assessment with patient. Patient did not participate , documented impairments in language in cognition from speech/language evaluation on 01/08/20.  Called mother Thermon Leyland [phone] , no answer , no voicemail . Will try again   Ronny Flurry RN

## 2020-01-24 NOTE — ED Notes (Signed)
Attempted report x1. 

## 2020-01-25 LAB — BASIC METABOLIC PANEL
Anion gap: 11 (ref 5–15)
BUN: 67 mg/dL — ABNORMAL HIGH (ref 6–20)
CO2: 27 mmol/L (ref 22–32)
Calcium: 9.4 mg/dL (ref 8.9–10.3)
Chloride: 111 mmol/L (ref 98–111)
Creatinine, Ser: 2.12 mg/dL — ABNORMAL HIGH (ref 0.61–1.24)
GFR, Estimated: 36 mL/min — ABNORMAL LOW (ref >60)
Glucose, Bld: 136 mg/dL — ABNORMAL HIGH (ref 70–99)
Potassium: 3.9 mmol/L (ref 3.5–5.1)
Sodium: 149 mmol/L — ABNORMAL HIGH (ref 135–145)

## 2020-01-25 LAB — SODIUM
Sodium: 146 mmol/L — ABNORMAL HIGH (ref 135–145)
Sodium: 147 mmol/L — ABNORMAL HIGH (ref 135–145)

## 2020-01-25 LAB — MAGNESIUM: Magnesium: 2.5 mg/dL — ABNORMAL HIGH (ref 1.7–2.4)

## 2020-01-25 LAB — CBC WITH DIFFERENTIAL/PLATELET
Abs Immature Granulocytes: 0 10*3/uL (ref 0.00–0.07)
Basophils Absolute: 0 10*3/uL (ref 0.0–0.1)
Basophils Relative: 1 %
Eosinophils Absolute: 0.1 10*3/uL (ref 0.0–0.5)
Eosinophils Relative: 2 %
HCT: 43.8 % (ref 39.0–52.0)
Hemoglobin: 14 g/dL (ref 13.0–17.0)
Immature Granulocytes: 0 %
Lymphocytes Relative: 34 %
Lymphs Abs: 1.4 10*3/uL (ref 0.7–4.0)
MCH: 29.4 pg (ref 26.0–34.0)
MCHC: 32 g/dL (ref 30.0–36.0)
MCV: 91.8 fL (ref 80.0–100.0)
Monocytes Absolute: 0.4 10*3/uL (ref 0.1–1.0)
Monocytes Relative: 10 %
Neutro Abs: 2.2 10*3/uL (ref 1.7–7.7)
Neutrophils Relative %: 53 %
Platelets: 95 10*3/uL — ABNORMAL LOW (ref 150–400)
RBC: 4.77 MIL/uL (ref 4.22–5.81)
RDW: 12.4 % (ref 11.5–15.5)
WBC: 4.2 10*3/uL (ref 4.0–10.5)
nRBC: 0 % (ref 0.0–0.2)

## 2020-01-25 LAB — GLUCOSE, CAPILLARY
Glucose-Capillary: 114 mg/dL — ABNORMAL HIGH (ref 70–99)
Glucose-Capillary: 138 mg/dL — ABNORMAL HIGH (ref 70–99)
Glucose-Capillary: 112 mg/dL — ABNORMAL HIGH (ref 70–99)
Glucose-Capillary: 144 mg/dL — ABNORMAL HIGH (ref 70–99)

## 2020-01-25 MED ORDER — HYDRALAZINE HCL 20 MG/ML IJ SOLN
10.0000 mg | Freq: Four times a day (QID) | INTRAMUSCULAR | Status: DC | PRN
  Administered 2020-01-27 – 2020-02-24 (×8): 10 mg via INTRAVENOUS
  Filled 2020-01-25 (×12): qty 1

## 2020-01-25 NOTE — Evaluation (Signed)
Occupational Therapy Evaluation Patient Details Name: Kevin [name] MRN: [id number] DOB: Nov 14, 1965 Today's Date: 01/25/2020    History of Present Illness Pt is a 55 y/o male admitted secondary to weakness, generalized decline at home, and hyponatremia. PMH includes L eye blindness, DM, CVA, HTN, CHF, and CKD.   Clinical Impression   Pt presents with decline in function and safety with ADLs and ADL mobility with impaired strength, balance, endurance and cognition. Pt with functional deficit listed below and would benefit from acute OT services to maximize level of function and safety    Follow Up Recommendations  Supervision/Assistance - 24 hour;SNF    Equipment Recommendations  Other (comment) (TBD at next venue of care)    Recommendations for Other Services       Precautions / Restrictions Precautions Precautions: Fall      Mobility Bed Mobility Overal bed mobility: Needs Assistance Bed Mobility: Supine to Sit     Supine to sit: Min guard     General bed mobility comments: Increased time required.    Transfers Overall transfer level: Needs assistance Equipment used: Rolling walker (2 wheeled) Transfers: Sit to/from Stand Sit to Stand: Min assist Stand pivot transfers: Min assist            Balance Overall balance assessment: Needs assistance Sitting-balance support: No upper extremity supported;Feet supported Sitting balance-Leahy Scale: Fair     Standing balance support: No upper extremity supported;During functional activity Standing balance-Leahy Scale: Poor                             ADL either performed or assessed with clinical judgement   ADL Overall ADL's : Needs assistance/impaired Eating/Feeding: Set up;Sitting;Supervision/ safety   Grooming: Standing;Wash/dry hands;Min guard;Wash/dry face;Cueing for safety   Upper Body Bathing: Minimal assistance;Sitting   Lower Body Bathing: Maximal assistance;Sit to/from stand    Upper Body Dressing : Standing;Minimal assistance   Lower Body Dressing: Sit to/from stand;Maximal assistance   Toilet Transfer: Minimal assistance;Ambulation;RW;Cueing for sequencing;Cueing for safety;Comfort height toilet;Grab bars   Toileting- Clothing Manipulation and Hygiene: Sit to/from stand;Moderate assistance       Functional mobility during ADLs: Minimal assistance;Rolling walker;Cueing for sequencing;Cueing for safety General ADL Comments: pt  presents with cognitive impairments, impaired safety awareness     Vision Patient Visual Report: No change from baseline       Perception     Praxis      Pertinent Vitals/Pain Pain Assessment: No/denies pain Faces Pain Scale: No hurt     Hand Dominance Right   Extremity/Trunk Assessment Upper Extremity Assessment Upper Extremity Assessment: Generalized weakness   Lower Extremity Assessment Lower Extremity Assessment: Defer to PT evaluation   Cervical / Trunk Assessment Cervical / Trunk Assessment: Normal   Communication Communication Communication: Expressive difficulties   Cognition Arousal/Alertness: Awake/alert Behavior During Therapy: Flat affect Overall Cognitive Status: No family/caregiver present to determine baseline cognitive functioning Area of Impairment: Attention;Memory;Following commands;Safety/judgement;Awareness;Problem solving;Orientation                       Following Commands: Follows multi-step commands inconsistently;Follows one step commands with increased time Safety/Judgement: Decreased awareness of safety;Decreased awareness of deficits   Problem Solving: Slow processing;Decreased initiation;Difficulty sequencing;Requires verbal cues;Requires tactile cues General Comments: Pt very slow to respond. Followed one step commands with increased time. Cues for sequencing.   General Comments       Exercises     Shoulder Instructions  Home Living Family/patient expects to  be discharged to:: Private residence Living Arrangements: Parent Available Help at Discharge: Family;Available 24 hours/day Type of Home: House Home Access: Level entry     Home Layout: One level     Bathroom Shower/Tub: Producer, television/film/video: Standard     Home Equipment: None   Additional Comments: pt with confusion, uncertain if he is providing accurate PLOF info, info obtained from previous chart  Lives With: Family    Prior Functioning/Environment Level of Independence: Independent        Comments: Unsure of baseline. Pt reporting he is independent, but no family present.        OT Problem List: Decreased activity tolerance;Impaired balance (sitting and/or standing);Impaired vision/perception;Decreased cognition;Decreased safety awareness      OT Treatment/Interventions: Self-care/ADL training;DME and/or AE instruction;Therapeutic activities;Patient/family education;Balance training;Neuromuscular education;Therapeutic exercise    OT Goals(Current goals can be found in the care plan section) Acute Rehab OT Goals Patient Stated Goal: not stated OT Goal Formulation: With patient Time For Goal Achievement: 02/08/20 Potential to Achieve Goals: Good ADL Goals Pt Will Perform Grooming: with supervision;with set-up;standing Pt Will Perform Upper Body Bathing: with supervision;with set-up;sitting Pt Will Perform Lower Body Bathing: with mod assist;with min assist;sitting/lateral leans;sit to/from stand Pt Will Perform Upper Body Dressing: with supervision;with set-up;sitting Pt Will Transfer to Toilet: with min guard assist;ambulating;regular height toilet;grab bars  OT Frequency: Min 2X/week   Barriers to D/C:            Co-evaluation              AM-PAC OT "6 Clicks" Daily Activity     Outcome Measure Help from another person eating meals?: None Help from another person taking care of personal grooming?: A Little Help from another person  toileting, which includes using toliet, bedpan, or urinal?: A Lot Help from another person bathing (including washing, rinsing, drying)?: A Lot Help from another person to put on and taking off regular upper body clothing?: A Little Help from another person to put on and taking off regular lower body clothing?: A Lot 6 Click Score: 16   End of Session Equipment Utilized During Treatment: Gait belt;Rolling walker  Activity Tolerance: Patient tolerated treatment well Patient left: with call bell/phone within reach;in chair;with chair alarm set  OT Visit Diagnosis: Unsteadiness on feet (R26.81);Cognitive communication deficit (R41.841);Low vision, both eyes (H54.2);Other symptoms and signs involving cognitive function;Muscle weakness (generalized) (M62.81);Other abnormalities of gait and mobility (R26.89)                Time: 1000-1025 OT Time Calculation (min): 25 min Charges:  OT General Charges $OT Visit: 1 Visit OT Evaluation $OT Eval Moderate Complexity: 1 Mod OT Treatments $Self Care/Home Management : 8-22 mins    Galen Manila 01/25/2020, 1:47 PM

## 2020-01-25 NOTE — Progress Notes (Signed)
  Speech Language Pathology Treatment: Dysphagia  Patient Details Name: Kevin [name] MRN: [id number] DOB: September 29, 1965 Today's Date: 01/25/2020 Time: 4656-8127 SLP Time Calculation (min) (ACUTE ONLY): 12 min  Assessment / Plan / Recommendation Clinical Impression  F/u after yesterday's swallow assessment.  Pt is alert, demonstrates significant delays in initiation and processing time, both of which are likely to be interfering with his PO intake.  Mastication, attention to material, swallow initiation, and airway protection all appear to be WNL in terms of the mechanics, but the cognitive deficits create the need for verbal cues/encouragement to initiate and sustain self-feeding and swallowing.    Recommend allowing regular solids/thin liquids.  He will need staff assistance or family in order to prompt him to eat.  There are no further acute swallowing f/u needs.  Pt will benefit from cognitive/language assessment and f/u in the next venue of care if he goes to SNF (he did not get the [facility] f/u that was anticipated, unfortunately).    HPI HPI: Pt is a 55 y.o. male with medical history significant of systolic and diastolic heart failure, CKD, CVA with cognitive impairment, diabetes, hypertension, left eye blindness who presented secondary to weakness and generalized decline at home. On admission, pt's family reported decreased p.o. intake and wincing when swallowing solids though he has denied odynophagia. CXR 1/12 was negative.      SLP Plan  All goals met       Recommendations  Diet recommendations: Thin liquid;Regular Liquids provided via: Cup;Straw Medication Administration: Whole meds with liquid Supervision: Patient able to self feed Postural Changes and/or Swallow Maneuvers: Seated upright 90 degrees                Oral Care Recommendations: Oral care BID Follow up Recommendations: Home health SLP or SNF for rehab SLP Visit Diagnosis: Dysphagia, unspecified (R13.10) Plan:  All goals met       GO              Toma Arts L. Tivis Ringer, [place] CCC/SLP Acute Rehabilitation Services Office number [phone] Pager [phone]   Kevin [name] 01/25/2020, 2:56 PM

## 2020-01-25 NOTE — Plan of Care (Signed)
°  Problem: Clinical Measurements: °Goal: Ability to maintain clinical measurements within normal limits will improve °Outcome: Progressing °Goal: Will remain free from infection °Outcome: Progressing °Goal: Diagnostic test results will improve °Outcome: Progressing °Goal: Respiratory complications will improve °Outcome: Progressing °  °

## 2020-01-25 NOTE — Progress Notes (Signed)
CSW tried to call patients mother Kevin [name] to discuss dc plans for patient for when he is medically ready. No answer. CSW left voicemail. CSW awaiting callback.

## 2020-01-25 NOTE — Progress Notes (Signed)
PROGRESS NOTE    Alyx Gee [facility]  JOA:[id number] DOB: January 19, 1965 DOA: 01/23/2020 PCP: Medicine, [facility]   Brief Narrative:  HPI: Kevin [name] is a 55 y.o. male with medical history significant of systolic and diastolic heart failure, CKD, CVA, diabetes, hypertension, left eye blindness who presents after generalized decline at home.             Patient family reports he has had generalized decline at home since his discharge following a CVA in December.  He was supposed to be set up with home health agency however he does not have insurance and the home health agency he was working with would not accept cash from the family.  He has had decreased p.o. intake family reports wincing when swallowing solids however he denies any pain with swallowing.  They have also noted a generalized decreased p.o. intake.  He has residual right-sided weakness and cognitive deficits following his stroke in December. Denies fever, cough, chills, chest pain, shortness of breath, abdominal pain, nausea, vomiting, constipation, diarrhea.  ED Course: Vital signs in ED significant for blood pressure in the 120s to 150s systolic.  Lab work-up showed BMP with sodium 152, creatinine 2.44 from baseline 1.5, gap 18, glucose 137.  Calcium 10.5.  CBC showed hemoglobin 18 and platelets 144.    COVID and flu negative. Patient started on IV fluids in the ED and admitted to hospital service.  Assessment & Plan:   Principal Problem:   Hypernatremia Active Problems:   Diabetes mellitus without complication (HCC)   History of CVA (cerebrovascular accident)   Essential hypertension   CKD (chronic kidney disease), stage III (HCC)   Chronic CHF (congestive heart failure) (HCC)   Acute hypernatremia   Hypovolemic hypernatremia: Presented with sodium of 152 which has improved to 146 today.  Continue half-normal saline but reduce the frequency from 125 cc/h to 75 cc/h to reduce the risk of fluid overload as he  has history of systolic congestive heart failure.  Monitor sodium every 6 hours.   Dysphagia: Concern of dysphagia by family.  Seen by SLP, has mild aspiration risk and currently on dysphagia 3 diet.  Essential hypertension: Blood pressure slightly elevated.  Amlodipine has been resumed.  Hydrochlorothiazide and lisinopril on hold due to AKI.  Diastolic slightly elevated.  Already on as needed hydralazine but will lower the diastolic parameters to 90.  Type 2 diabetes mellitus: Takes metformin at home which is on hold.  Continue SSI.  History of systolic and diastolic heart failure: Chronically on diuretics, looks dehydrated.  It appears that he is not on any diuretics.  AKI on CKD stage IIIb: Patient's creatinine last month at the time of discharge was 1.9 with GFR of 41.  Currently creatinine 2.4 which worsened slightly yesterday but improved to 2.12 today.  Continue IV fluids and repeat labs in the morning.  Generalized weakness: Patient seems to be very slow in response: PT OT consulted.  They recommend SNF.  Recent history of CVA: Admitted for CVA last month, discharged with home health however was able to obtain the services as he is uninsured and agency would not accept cash. > Residual deficits of right-sided weakness and cognitive deficits per report however on my examination, he does not have significant weakness.  As above family has some concerns for dysphagia. Continue statin and Eliquis - SLP, PT, OT eval and treat -Transitions of care consult  Acute encephalopathy: Patient was alert but disoriented.  He could not  answer any question about orientation yesterday or today.  I tried calling his father yesterday and twice today and was unable to get a hold of him.  He lives with his father.  I was able to talk to the mother who also knows about the patient and she tells me that patient has been confused since he had a stroke and since the discharge but he is little more confused now.   Wondering if there is some more acute versus subacute stroke on top of the recent stroke however he has no new focal deficit and in fact the deficits that were charted in the H&P are also not obvious to me so plan to monitor for another day and if he remains confused, low threshold should be to repeat MRI brain.  DVT prophylaxis:    Code Status: Full Code  Family Communication: None present at bedside.  Tried calling his father at [phone], unable to connect yesterday.  Tried calling the same number again today and his cell phone which is [phone] and left him a voicemail.  Spoke to his mother over the phone.  .Status is: Inpatient  Remains inpatient appropriate because:Inpatient level of care appropriate due to severity of illness   Dispo: The patient is from: Home              Anticipated d/c is to: SNF              Anticipated d/c date is: 2 days              Patient currently is not medically stable to d/c.             Estimated body mass index is 20.72 kg/m as calculated from the following:   Height as of 01/07/20: 6\' 2"  (1.88 m).   Weight as of 01/08/20: 73.2 kg.      Nutritional status:               Consultants:   None  Procedures:   None  Antimicrobials:  Anti-infectives (From admission, onward)   None         Subjective: Patient seen and examined.  Alert but likely not oriented.  Did not answer any question about orientation.  Objective: Vitals:   01/24/20 1300 01/24/20 1608 01/24/20 2108 01/25/20 0352  BP: (!) 148/94 (!) 152/96 (!) 138/97 (!) 147/90  Pulse: 65 62 (!) 55 (!) 55  Resp: 16 16 18 18   Temp: 98.5 F (36.9 C) 98.1 F (36.7 C) (!) 97.4 F (36.3 C) 97.6 F (36.4 C)  TempSrc: Oral Oral  Axillary  SpO2: 100% 100% 100% 100%    Intake/Output Summary (Last 24 hours) at 01/25/2020 1258 Last data filed at 01/25/2020 0356 Gross per 24 hour  Intake 1042.08 ml  Output 1000 ml  Net 42.08 ml   There were no vitals filed  for this visit.  Examination:  General exam: Appears calm and comfortable  Respiratory system: Clear to auscultation. Respiratory effort normal. Cardiovascular system: S1 & S2 heard, RRR. No JVD, murmurs, rubs, gallops or clicks. No pedal edema. Gastrointestinal system: Abdomen is nondistended, soft and nontender. No organomegaly or masses felt. Normal bowel sounds heard. Central nervous system: Alert but pleasantly confused.  Very slight weakness in the right upper and lower extremity. Extremities: Symmetric 5 x 5 power. Skin: No rashes, lesions or ulcers.   Data Reviewed: I have personally reviewed following labs and imaging studies  CBC: Recent Labs  Lab 01/23/20 1722  01/24/20 0500 01/25/20 0221  WBC 5.4 5.0 4.2  NEUTROABS  --   --  2.2  HGB 18.1* 15.3 14.0  HCT 57.3* 47.2 43.8  MCV 92.6 91.8 91.8  PLT 144* 113* 95*   Basic Metabolic Panel: Recent Labs  Lab 01/23/20 1722 01/24/20 0500 01/24/20 1417 01/24/20 1818 01/25/20 0221 01/25/20 1136  NA 152* 151* 150* 151* 149* 146*  K 4.0 4.4  --   --  3.9  --   CL 108 110  --   --  111  --   CO2 26 29  --   --  27  --   GLUCOSE 137* 155*  --   --  136*  --   BUN 80* 86*  --   --  67*  --   CREATININE 2.44* 2.67*  --   --  2.12*  --   CALCIUM 10.5* 9.7  --   --  9.4  --   MG  --   --   --   --  2.5*  --    GFR: CrCl cannot be calculated (Unknown ideal weight.). Liver Function Tests: Recent Labs  Lab 01/24/20 0500  AST 17  ALT 10  ALKPHOS 60  BILITOT 1.1  PROT 7.3  ALBUMIN 3.8   No results for input(s): LIPASE, AMYLASE in the last 168 hours. No results for input(s): AMMONIA in the last 168 hours. Coagulation Profile: Recent Labs  Lab 01/24/20 0500  INR 1.3*   Cardiac Enzymes: No results for input(s): CKTOTAL, CKMB, CKMBINDEX, TROPONINI in the last 168 hours. BNP (last 3 results) No results for input(s): PROBNP in the last 8760 hours. HbA1C: No results for input(s): HGBA1C in the last 72  hours. CBG: Recent Labs  Lab 01/24/20 1350 01/24/20 1715 01/24/20 2108 01/25/20 0753 01/25/20 1214  GLUCAP 171* 132* 111* 114* 138*   Lipid Profile: No results for input(s): CHOL, HDL, LDLCALC, TRIG, CHOLHDL, LDLDIRECT in the last 72 hours. Thyroid Function Tests: No results for input(s): TSH, T4TOTAL, FREET4, T3FREE, THYROIDAB in the last 72 hours. Anemia Panel: No results for input(s): VITAMINB12, FOLATE, FERRITIN, TIBC, IRON, RETICCTPCT in the last 72 hours. Sepsis Labs: No results for input(s): PROCALCITON, LATICACIDVEN in the last 168 hours.  Recent Results (from the past 240 hour(s))  SARS CORONAVIRUS 2 (TAT 6-24 HRS) Nasopharyngeal Nasopharyngeal Swab     Status: None   Collection Time: 01/23/20  9:47 PM   Specimen: Nasopharyngeal Swab  Result Value Ref Range Status   SARS Coronavirus 2 NEGATIVE NEGATIVE Final    Comment: (NOTE) SARS-CoV-2 target nucleic acids are NOT DETECTED.  The SARS-CoV-2 RNA is generally detectable in upper and lower respiratory specimens during the acute phase of infection. Negative results do not preclude SARS-CoV-2 infection, do not rule out co-infections with other pathogens, and should not be used as the sole basis for treatment or other patient management decisions. Negative results must be combined with clinical observations, patient history, and epidemiological information. The expected result is Negative.  Fact Sheet for Patients: HairSlick.nohttps://www.fda.gov/media/138098/download  Fact Sheet for Healthcare Providers: quierodirigir.comhttps://www.fda.gov/media/138095/download  This test is not yet approved or cleared by the [place] FDA and  has been authorized for detection and/or diagnosis of SARS-CoV-2 by FDA under an Emergency Use Authorization (EUA). This EUA will remain  in effect (meaning this test can be used) for the duration of the COVID-19 declaration under Se ction 564(b)(1) of the Act, 21 U.S.C. section 360bbb-3(b)(1), unless the  authorization is terminated or  revoked sooner.  Performed at [facility], [address], [place], [place] [address]       Radiology Studies: DG Chest 2 View  Result Date: 01/23/2020 CLINICAL DATA:  Swallowing difficulties EXAM: CHEST - 2 VIEW COMPARISON:  Radiograph 01/04/2020 FINDINGS: No consolidation, features of edema, pneumothorax, or effusion. Tracheal air column is patent. No suspicious pulmonary nodules or masses. The cardiomediastinal contours are unremarkable. No acute osseous or soft tissue abnormality. Degenerative changes are present in the imaged spine and shoulders. IMPRESSION: No acute cardiopulmonary abnormality. Electronically Signed   By: Kreg Shropshire M.D.   On: 01/23/2020 22:45    Scheduled Meds: . amLODipine  10 mg Oral Daily  . apixaban  5 mg Oral BID  . atorvastatin  80 mg Oral Daily  . insulin aspart  0-9 Units Subcutaneous TID WC  . sodium chloride flush  3 mL Intravenous Q12H   Continuous Infusions: . sodium chloride 125 mL/hr at 01/25/20 0957     LOS: 1 day   Time spent: 33 minutes   Hughie Closs, MD [facility]  01/25/2020, 12:58 PM   To contact the attending provider between 7A-7P or the covering provider during after hours 7P-7A, please log into the web site [email or web address].

## 2020-01-26 DIAGNOSIS — N179 Acute kidney failure, unspecified: Secondary | ICD-10-CM

## 2020-01-26 DIAGNOSIS — I10 Essential (primary) hypertension: Secondary | ICD-10-CM

## 2020-01-26 DIAGNOSIS — R1310 Dysphagia, unspecified: Secondary | ICD-10-CM

## 2020-01-26 DIAGNOSIS — N1832 Chronic kidney disease, stage 3b: Secondary | ICD-10-CM

## 2020-01-26 DIAGNOSIS — Z8673 Personal history of transient ischemic attack (TIA), and cerebral infarction without residual deficits: Secondary | ICD-10-CM

## 2020-01-26 LAB — GLUCOSE, CAPILLARY
Glucose-Capillary: 107 mg/dL — ABNORMAL HIGH (ref 70–99)
Glucose-Capillary: 122 mg/dL — ABNORMAL HIGH (ref 70–99)
Glucose-Capillary: 178 mg/dL — ABNORMAL HIGH (ref 70–99)
Glucose-Capillary: 103 mg/dL — ABNORMAL HIGH (ref 70–99)

## 2020-01-26 LAB — SODIUM
Sodium: 145 mmol/L (ref 135–145)
Sodium: 147 mmol/L — ABNORMAL HIGH (ref 135–145)
Sodium: 145 mmol/L (ref 135–145)

## 2020-01-26 NOTE — Progress Notes (Signed)
[facility]  PROGRESS NOTE  Kevin [name] [place] NWG:[id number] DOB: November 12, 1965 DOA: 01/23/2020 PCP: [facility]   Brief HPI:   55 year old male with medical history of systolic and diastolic heart failure, CKD, CVA, diabetes mellitus type 2, hypertension, left eye blindness who presented after generalized decline at home.  Patient family states that he had general decline at home since his discharge following CVA in December.  He was supposed to set up with home health agency however he did not have insurance or home malignancy was working could not accept cash for the family.  He has had decreased p.o. intake.  Patient has residual cognitive deficits following stroke in December.    Subjective   Patient seen and examined, denies any complaints.   Assessment/Plan:     1. Hypovolemic hypernatremia-patient presented with sodium of 152, it has improved to 145 today.  Continue half-normal saline at 75 mL/h.  Follow serum sodium level in a.m. 2. Dysphagia-seen by speech therapy, has mild aspiration risk and currently on dysphagia 3 diet. 3. Essential hypertension-continue amlodipine, blood pressure is elevated.  HCTZ and lisinopril on hold due to AKI.  Continue as needed hydralazine. 4. Diabetes mellitus type 2-continue sliding scale insulin with NovoLog. 5. Acute kidney injury on CKD stage IIIb-patient's creatinine was around 1.9 last month.  Presented with creatinine of 2.4.  Today creatinine 2.12.  Follow BMP in AM.  Continue gentle IV hydration. 6. Urinalysis weakness-secondary to CVA, residual deficits of right-sided weakness.  PT recommends skilled nursing facility.  Continue statin and Eliquis. 7. Encephalopathy-unclear etiology, could be from dehydration.  Will obtain MRI brain.     COVID-19 Labs  No results for input(s): DDIMER, FERRITIN, LDH, CRP in the last 72 hours.  Lab Results  Component Value Date   SARSCOV2NAA NEGATIVE 01/23/2020   SARSCOV2NAA  NEGATIVE 01/04/2020   SARSCOV2NAA NEGATIVE 11/29/2019     Scheduled medications:   . amLODipine  10 mg Oral Daily  . apixaban  5 mg Oral BID  . atorvastatin  80 mg Oral Daily  . insulin aspart  0-9 Units Subcutaneous TID WC  . sodium chloride flush  3 mL Intravenous Q12H         CBG: Recent Labs  Lab 01/25/20 1214 01/25/20 1654 01/25/20 2051 01/26/20 0752 01/26/20 1135  GLUCAP 138* 112* 144* 107* 122*    SpO2: 99 %    CBC: Recent Labs  Lab 01/23/20 1722 01/24/20 0500 01/25/20 0221  WBC 5.4 5.0 4.2  NEUTROABS  --   --  2.2  HGB 18.1* 15.3 14.0  HCT 57.3* 47.2 43.8  MCV 92.6 91.8 91.8  PLT 144* 113* 95*    Basic Metabolic Panel: Recent Labs  Lab 01/23/20 1722 01/24/20 0500 01/24/20 1417 01/25/20 0221 01/25/20 1136 01/25/20 1757 01/26/20 0136 01/26/20 0724 01/26/20 1215  NA 152* 151*   < > 149* 146* 147* 145 147* 145  K 4.0 4.4  --  3.9  --   --   --   --   --   CL 108 110  --  111  --   --   --   --   --   CO2 26 29  --  27  --   --   --   --   --   GLUCOSE 137* 155*  --  136*  --   --   --   --   --   BUN 80* 86*  --  67*  --   --   --   --   --  CREATININE 2.44* 2.67*  --  2.12*  --   --   --   --   --   CALCIUM 10.5* 9.7  --  9.4  --   --   --   --   --   MG  --   --   --  2.5*  --   --   --   --   --    < > = values in this interval not displayed.     Liver Function Tests: Recent Labs  Lab 01/24/20 0500  AST 17  ALT 10  ALKPHOS 60  BILITOT 1.1  PROT 7.3  ALBUMIN 3.8     Antibiotics: Anti-infectives (From admission, onward)   None       DVT prophylaxis: Apixaban  Code Status: Full code  Family Communication: No family at bedside   Consultants:  Procedures:      Objective   Vitals:   01/25/20 1949 01/26/20 0557 01/26/20 1358 01/26/20 1418  BP: (!) 172/94 (!) 176/88 (!) 165/100   Pulse: (!) 58 (!) 51 (!) 56   Resp: 18 20 17    Temp: 97.7 F (36.5 C) 98.2 F (36.8 C) 98.7 F (37.1 C)   TempSrc: Oral Oral  Oral   SpO2: 100% 99% 99%   Weight:    69.7 kg    Intake/Output Summary (Last 24 hours) at 01/26/2020 1648 Last data filed at 01/26/2020 1403 Gross per 24 hour  Intake 400 ml  Output 400 ml  Net 0 ml    01/13 1901 - 01/15 0700 In: 862.1 [P.O.:260; I.V.:602.1] Out: 1000 [Urine:1000]  Filed Weights   01/26/20 1418  Weight: 69.7 kg    Physical Examination:    General-appears in no acute distress  Heart-S1-S2, regular, no murmur auscultated  Lungs-clear to auscultation bilaterally, no wheezing or crackles auscultated  Abdomen-soft, nontender, no organomegaly  Extremities-no edema in the lower extremities Neuro-alert, oriented x 2, mildly confused  Status is: Inpatient  Dispo: The patient is from: Home              Anticipated d/c is to: Skilled nursing facility              Anticipated d/c date is: 01/29/2020              Patient currently not medically stable for discharge  Barrier to discharge-awaiting bed at skilled nursing facility       Data Reviewed:   Recent Results (from the past 240 hour(s))  SARS CORONAVIRUS 2 (TAT 6-24 HRS) Nasopharyngeal Nasopharyngeal Swab     Status: None   Collection Time: 01/23/20  9:47 PM   Specimen: Nasopharyngeal Swab  Result Value Ref Range Status   SARS Coronavirus 2 NEGATIVE NEGATIVE Final    Comment: (NOTE) SARS-CoV-2 target nucleic acids are NOT DETECTED.  The SARS-CoV-2 RNA is generally detectable in upper and lower respiratory specimens during the acute phase of infection. Negative results do not preclude SARS-CoV-2 infection, do not rule out co-infections with other pathogens, and should not be used as the sole basis for treatment or other patient management decisions. Negative results must be combined with clinical observations, patient history, and epidemiological information. The expected result is Negative.  Fact Sheet for Patients: 03/22/20  Fact Sheet for Healthcare  Providers: HairSlick.no  This test is not yet approved or cleared by the quierodirigir.com FDA and  has been authorized for detection and/or diagnosis of SARS-CoV-2 by FDA under an Emergency Use Authorization (  EUA). This EUA will remain  in effect (meaning this test can be used) for the duration of the COVID-19 declaration under Se ction 564(b)(1) of the Act, 21 U.S.C. section 360bbb-3(b)(1), unless the authorization is terminated or revoked sooner.  Performed at [facility], [address], [place], [place] [address]     No results for input(s): LIPASE, AMYLASE in the last 168 hours. No results for input(s): AMMONIA in the last 168 hours.  Cardiac Enzymes: No results for input(s): CKTOTAL, CKMB, CKMBINDEX, TROPONINI in the last 168 hours. BNP (last 3 results) Recent Labs    11/29/19 0037 01/04/20 1339 01/07/20 1549  BNP 1,278.7* 465.9* 46.1    ProBNP (last 3 results) No results for input(s): PROBNP in the last 8760 hours.  Studies:  No results found.     Meredeth Ide   [facility] If 7PM-7AM, please contact night-coverage at [email or web address], Office  [phone]   01/26/2020, 4:48 PM  LOS: 2 days

## 2020-01-26 NOTE — Progress Notes (Deleted)
Cardiology Office Note:    Date:  01/26/2020   ID:  Kevin [name], DOB Nov 08, 1965, MRN [id number]  PCP:  [facility]  [facility] Cardiologist:  Meriam Sprague, MD  [facility] Electrophysiologist:  None   Referring MD: [facility]    History of Present Illness:    Kevin [name] is a 55 y.o. male with a hx of HTN, central retinal vein occlusion, DM and tobacco use who presents to clinic for follow-up from his recent hospitalization.  Patient was admitted to the hospital from 11/29/19-12/01/19 for respiratory distress and hypertensive emergency. In the ED, blood pressures were significantly elevated with systolic of 244.  Labs showed sodium 140, potassium 2.6, creatinine 1.85, BNP 1278, high-sensitivity troponin 675>> 505, WBC 14.3.  CXR showed cardiomegaly with diffuse vascular congestion and bilateral pulmonary edema. He was placed on BiPAP with significant improvement in respiratory status. EKG showed ST with LVH, biatrial enlargement. He was admitted to IM and placed IV nitro. TTE showed EF < 20% with global hypokinesis, grade 1 diastolic dysfunction, severe LVH and mildly dilated left atrium. During the admission, the patient did not want to pursue ischemic work-up and has history of significant non-compliance. He was discharged home prior to medical optimization.  Since that admission, he represented to the hospital with new internal capsule acute infract. Underwent TEE which was unrevealing. Presumed to have Afib and due to lack of insurance and inability to receive a loop recorder, he was placed on eliquis for anticoagulation.  He then represented again on 01/23/20 with generalized decline at home with worsening PO intake and altered mental status.    Past Medical History:  Diagnosis Date  . Blind left eye   . CKD (chronic kidney disease), stage III (HCC)   . CVA (cerebral vascular accident) (HCC)   . Diabetes mellitus without  complication (HCC)   . Hypertension     Past Surgical History:  Procedure Laterality Date  . BUBBLE STUDY  01/07/2020   Procedure: BUBBLE STUDY;  Surgeon: Jake Bathe, MD;  Location[facility] ENDOSCOPY;  Service: Cardiovascular;;  . GYNECOMASTIA EXCISION    . TEE WITHOUT CARDIOVERSION N/A 01/07/2020   Procedure: TRANSESOPHAGEAL ECHOCARDIOGRAM (TEE);  Surgeon: Jake Bathe, MD;  Location: [facility] ENDOSCOPY;  Service: Cardiovascular;  Laterality: N/A;    Current Medications: No outpatient medications have been marked as taking for the 02/05/20 encounter (Appointment) with Meriam Sprague, MD.     Allergies:   Patient has no known allergies.   Social History   Socioeconomic History  . Marital status: Single    Spouse name: Not on file  . Number of children: Not on file  . Years of education: Not on file  . Highest education level: Not on file  Occupational History  . Occupation: trying to get disability  Tobacco Use  . Smoking status: Former Smoker    Packs/day: 2.00    Years: 40.00    Pack years: 80.00  . Smokeless tobacco: Never Used  Vaping Use  . Vaping Use: Former  Substance and Sexual Activity  . Alcohol use: Yes    Comment: denies heavy use  . Drug use: Yes    Types: Marijuana  . Sexual activity: Not on file  Other Topics Concern  . Not on file  Social History Narrative  . Not on file   Social Determinants of Health   Financial Resource Strain: Not on file  Food Insecurity: Not on file  Transportation Needs: Not on file  Physical Activity: Not on file  Stress: Not on file  Social Connections: Not on file     Family History: The patient's ***family history includes Arthritis in his maternal grandmother; Diabetes in his maternal grandmother and mother; Hypertension in his father, maternal grandmother, and mother. There is no history of Congestive Heart Failure.  ROS:   Please see the history of present illness.    *** All other systems reviewed and are  negative.  EKGs/Labs/Other Studies Reviewed:    The following studies were reviewed today: 2D echo 11/29/19 1. Left ventricular ejection fraction, by estimation, is <20%. The left  ventricle has severely decreased function. The left ventricle demonstrates  global hypokinesis. The left ventricular internal cavity size was  moderately dilated. There is severe left  ventricular hypertrophy. Left ventricular diastolic parameters are  consistent with Grade I diastolic dysfunction (impaired relaxation).  2. Right ventricular systolic function is moderately reduced. The right  ventricular size is normal. Tricuspid regurgitation signal is inadequate  for assessing PA pressure.  3. Left atrial size was mildly dilated.  4. The mitral valve is normal in structure. Mild mitral valve  regurgitation.  5. The aortic valve is tricuspid. Aortic valve regurgitation is not  visualized. No aortic stenosis is present.  6. Aortic dilatation noted. There is mild dilatation of the aortic root,  measuring 41 mm.  7. The inferior vena cava is normal in size with greater than 50%  respiratory variability, suggesting right atrial pressure of 3 mmHg.  EKG:  EKG is *** ordered today.  The ekg ordered today demonstrates ***  Recent Labs: 11/30/2019: TSH 0.447 01/07/2020: B Natriuretic Peptide 46.1 01/24/2020: ALT 10 01/25/2020: BUN 67; Creatinine, Ser 2.12; Hemoglobin 14.0; Magnesium 2.5; Platelets 95; Potassium 3.9 01/26/2020: Sodium 145  Recent Lipid Panel    Component Value Date/Time   CHOL 217 (H) 01/05/2020 0335   TRIG 77 01/05/2020 0335   HDL 53 01/05/2020 0335   CHOLHDL 4.1 01/05/2020 0335   VLDL 15 01/05/2020 0335   LDLCALC 149 (H) 01/05/2020 0335     Risk Assessment/Calculations:   {Does this patient have ATRIAL FIBRILLATION?[phone]}   Physical Exam:    VS:  There were no vitals taken for this visit.    Wt Readings from Last 3 Encounters:  01/26/20 153 lb 10.6 oz (69.7 kg)   01/08/20 161 lb 6 oz (73.2 kg)  12/01/19 168 lb 6.9 oz (76.4 kg)     GEN: *** Well nourished, well developed in no acute distress HEENT: Normal NECK: No JVD; No carotid bruits LYMPHATICS: No lymphadenopathy CARDIAC: ***RRR, no murmurs, rubs, gallops RESPIRATORY:  Clear to auscultation without rales, wheezing or rhonchi  ABDOMEN: Soft, non-tender, non-distended MUSCULOSKELETAL:  No edema; No deformity  SKIN: Warm and dry NEUROLOGIC:  Alert and oriented x 3 PSYCHIATRIC:  Normal affect   ASSESSMENT:    No diagnosis found. PLAN:    In order of problems listed above:  1. ***   {Are you ordering a CV Procedure (e.g. stress test, cath, DCCV, TEE, etc)?   Press F2        :[id number]}    Medication Adjustments/Labs and Tests Ordered: Current medicines are reviewed at length with the patient today.  Concerns regarding medicines are outlined above.  No orders of the defined types were placed in this encounter.  No orders of the defined types were placed in this encounter.   There are no Patient Instructions on file for this visit.  Signed, Meriam Sprague, MD  01/26/2020 6:03 PM    [facility]

## 2020-01-27 ENCOUNTER — Inpatient Hospital Stay (HOSPITAL_COMMUNITY): Payer: Medicaid Other

## 2020-01-27 LAB — BASIC METABOLIC PANEL
Anion gap: 9 (ref 5–15)
BUN: 32 mg/dL — ABNORMAL HIGH (ref 6–20)
CO2: 27 mmol/L (ref 22–32)
Calcium: 8.9 mg/dL (ref 8.9–10.3)
Chloride: 110 mmol/L (ref 98–111)
Creatinine, Ser: 1.53 mg/dL — ABNORMAL HIGH (ref 0.61–1.24)
GFR, Estimated: 53 mL/min — ABNORMAL LOW (ref >60)
Glucose, Bld: 127 mg/dL — ABNORMAL HIGH (ref 70–99)
Potassium: 3.4 mmol/L — ABNORMAL LOW (ref 3.5–5.1)
Sodium: 146 mmol/L — ABNORMAL HIGH (ref 135–145)

## 2020-01-27 LAB — GLUCOSE, CAPILLARY
Glucose-Capillary: 172 mg/dL — ABNORMAL HIGH (ref 70–99)
Glucose-Capillary: 112 mg/dL — ABNORMAL HIGH (ref 70–99)
Glucose-Capillary: 133 mg/dL — ABNORMAL HIGH (ref 70–99)

## 2020-01-27 IMAGING — MR MR HEAD W/O CM
12 of 13 series · 44 of 48 positions shown · non-contrast
Comparison: MRI [DATE].

CLINICAL DATA: Memory loss.  Encephalopathy.

EXAM:
MRI HEAD WITHOUT CONTRAST
TECHNIQUE: Multiplanar, multiecho pulse sequences of the brain and surrounding
structures were obtained without intravenous contrast.

[Series 5: DWI · axial · 3.0mm · 0.88mm/px · z∈[-131,+7]mm · 8 of 100 slices shown (1 of 4)]
[im 1/100]
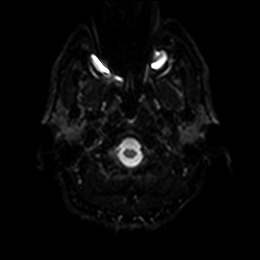
[im 15/100]
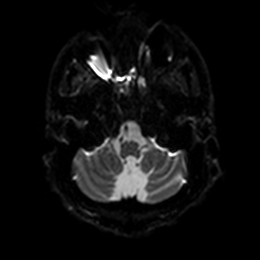
[im 29/100]
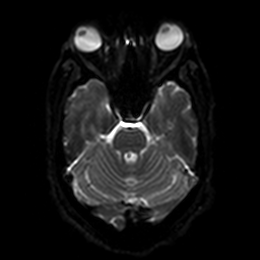
[im 43/100]
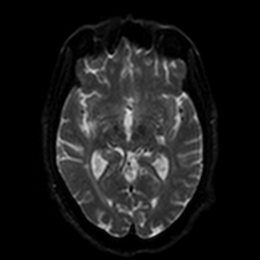
[im 57/100]
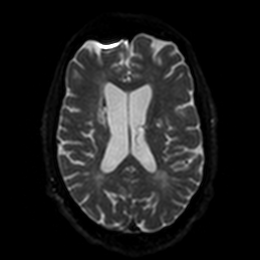
[im 71/100]
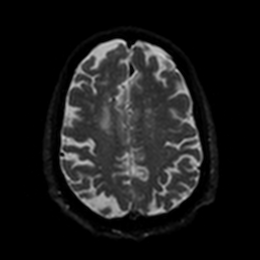
[im 85/100]
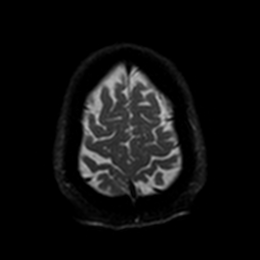
[im 100/100]
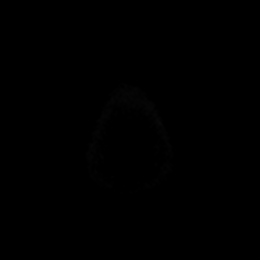

[Series 6: DWI · axial · 3.0mm · 0.88mm/px · z∈[-131,+7]mm · 4 of 50 slices shown (2 of 4)]
[im 1/50]
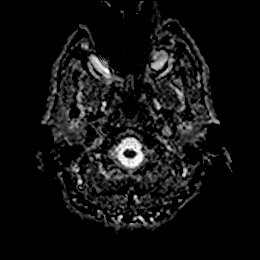
[im 17/50]
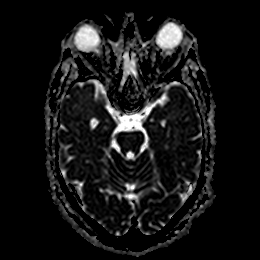
[im 33/50]
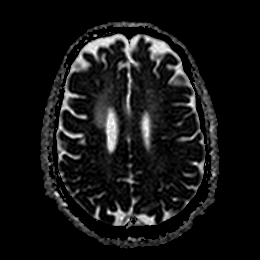
[im 50/50]
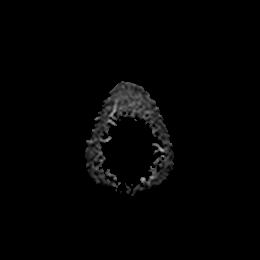

[Series 7: DWI · coronal · 4.0mm · 0.88mm/px · 5 of 72 slices shown (3 of 4)]
[im 1/72]
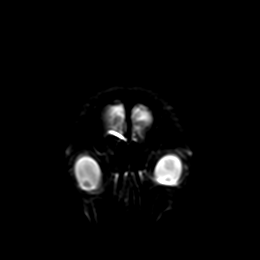
[im 18/72]
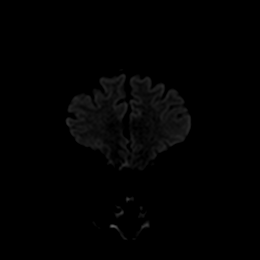
[im 36/72]
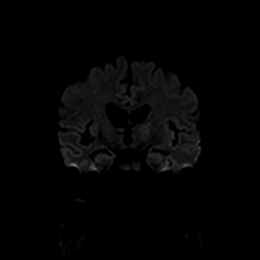
[im 54/72]
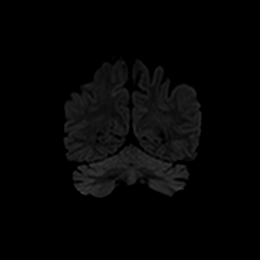
[im 72/72]
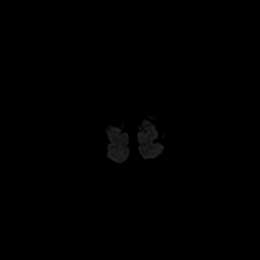

[Series 8: DWI · coronal · 4.0mm · 0.88mm/px · 3 of 36 slices shown (4 of 4)]
[im 1/36]
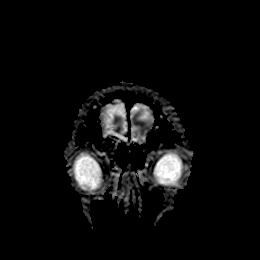
[im 18/36]
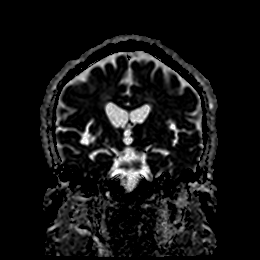
[im 36/36]
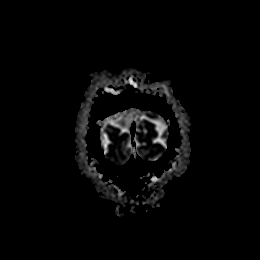

[Series 9: T1 · sagittal · 5.0mm · 0.75mm/px · 2 of 23 slices shown]
[im 1/23]
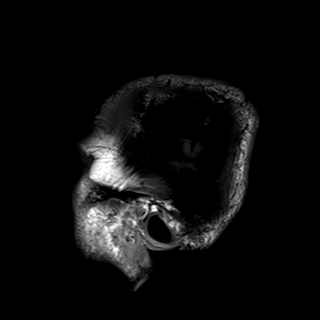
[im 23/23]
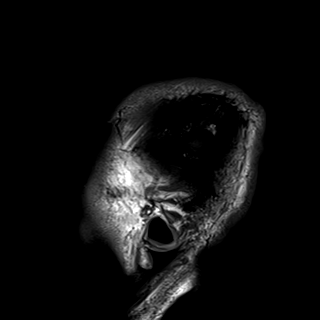

[Series 10: T2 · axial · 5.0mm · 0.72mm/px · z∈[-141,+5]mm · 2 of 27 slices shown (1 of 2)]
[im 1/27]
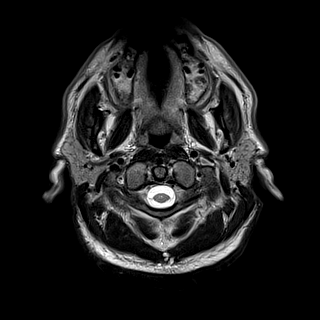
[im 27/27]
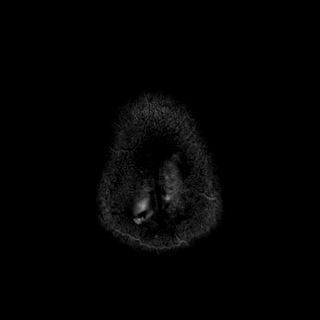

[Series 11: FLAIR · axial · 5.0mm · 0.45mm/px · z∈[-141,+5]mm · 2 of 27 slices shown]
[im 1/27]
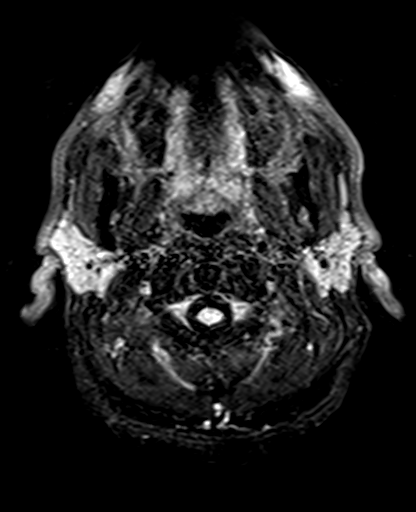
[im 27/27]
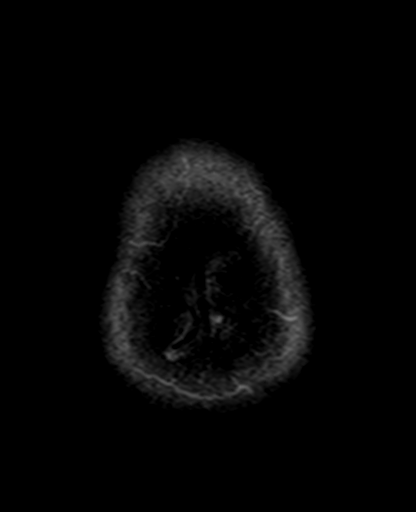

[Series 12: mag_images · axial · 3.0mm · 0.90mm/px · z∈[-151,+15]mm · 4 of 60 slices shown]
[im 1/60]
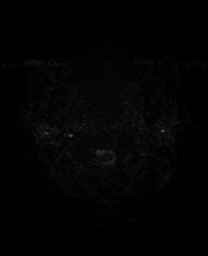
[im 20/60]
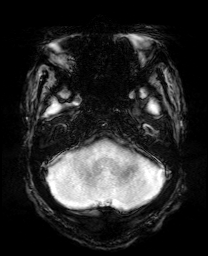
[im 40/60]
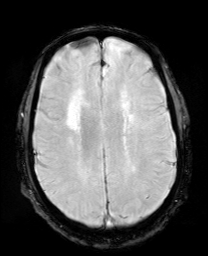
[im 60/60]
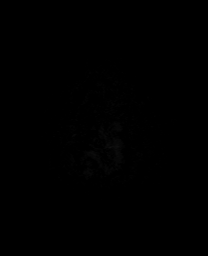

[Series 13: pha_images · axial · 3.0mm · 0.90mm/px · z∈[-148,+13]mm · 4 of 58 slices shown]
[im 1/58]
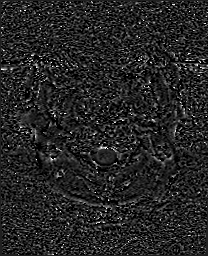
[im 20/58]
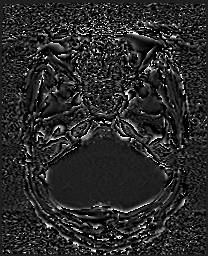
[im 39/58]
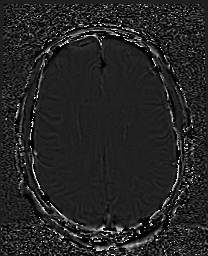
[im 58/58]
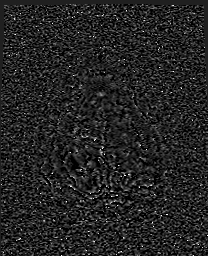

[Series 14: swi_images · axial · 3.0mm · 0.90mm/px · z∈[-151,+15]mm · 4 of 60 slices shown]
[im 1/60]
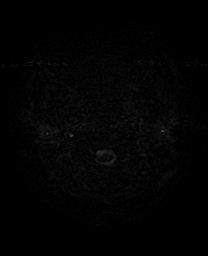
[im 20/60]
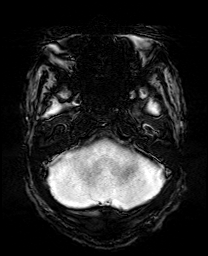
[im 40/60]
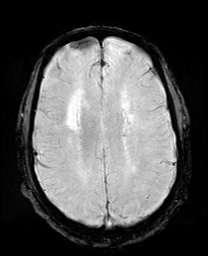
[im 60/60]
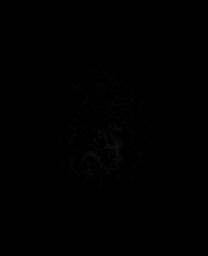

[Series 15: mip_images(sw) · axial · 24.0mm · 0.90mm/px · z∈[-141,+5]mm · 4 of 53 slices shown]
[im 1/53]
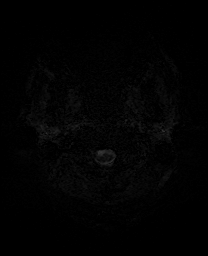
[im 18/53]
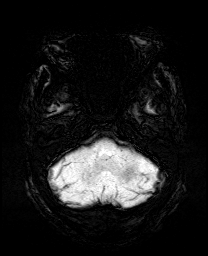
[im 35/53]
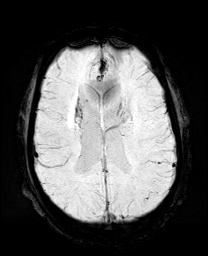
[im 53/53]
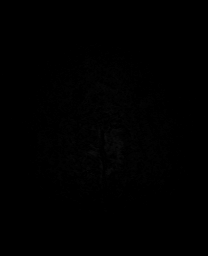

[Series 17: T2 · coronal · 5.0mm · 0.34mm/px · 2 of 30 slices shown (2 of 2)]
[im 1/30]
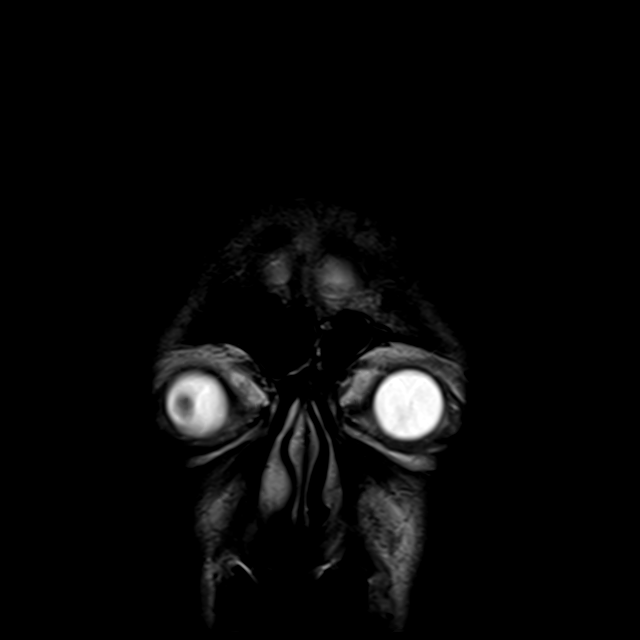
[im 30/30]
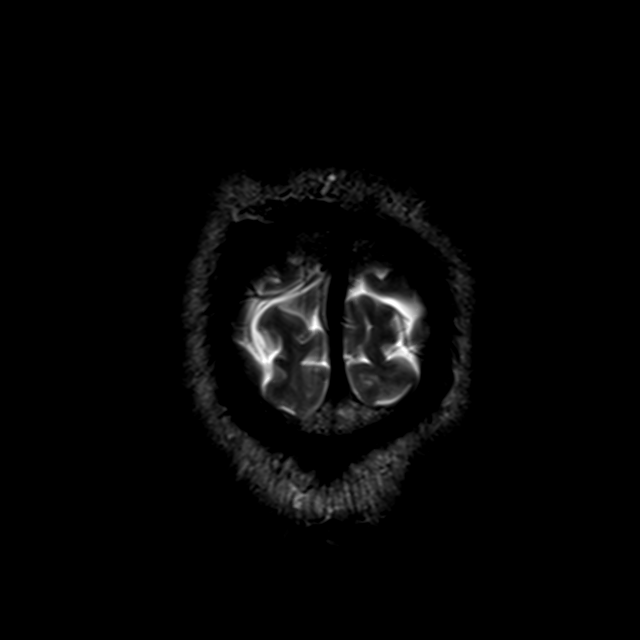

[44 of 48 positions shown; findings below may reference images not displayed]

FINDINGS: Brain: No acute infarct. Multiple remote infarcts in bilateral basal
ganglia, bilateral corona radiata, and the left cerebellar
hemisphere. Additional patchy T2/FLAIR hyperintensities within the
white matter, nonspecific but most likely related to chronic
microvascular ischemic disease. Generalized cerebral volume loss
with ex vacuo ventricular dilation. No hydrocephalus. No acute
hemorrhage. No mass lesion or abnormal mass effect. Similar chronic
microhemorrhage in the right frontal lobe.

Vascular: Major arterial flow voids are maintained at the skull
base.

Skull and upper cervical spine: Normal marrow signal.

Sinuses/Orbits: Retention cyst in bilateral maxillary sinuses.
Unremarkable orbits.

Other: No mastoid effusions.
IMPRESSION: 1. No acute abnormality.
2. Multiple infratentorial and supratentorial remote lacunar
infarcts, as described above.

## 2020-01-27 MED ORDER — POTASSIUM CHLORIDE CRYS ER 20 MEQ PO TBCR
40.0000 meq | EXTENDED_RELEASE_TABLET | Freq: Once | ORAL | Status: AC
  Administered 2020-01-27: 40 meq via ORAL
  Filled 2020-01-27: qty 2

## 2020-01-27 NOTE — Progress Notes (Addendum)
[facility]  PROGRESS NOTE  Kevin [name] [place] PFX:[id number] DOB: 1965/12/08 DOA: 01/23/2020 PCP: Medicine, [facility]   Brief HPI:   55 year old male with medical history of systolic and diastolic heart failure, CKD, CVA, diabetes mellitus type 2, hypertension, left eye blindness who presented after generalized decline at home.  Patient family states that he had general decline at home since his discharge following CVA in December.  He was supposed to set up with home health agency however he did not have insurance or home malignancy was working could not accept cash for the family.  He has had decreased p.o. intake.  Patient has residual cognitive deficits following stroke in December.    Subjective   Patient seen and examined, denies any complaints.   Assessment/Plan:     1. Hypovolemic hypernatremia-patient presented with sodium of 152, it has improved to 146 today.  Continue half-normal saline at 75 mL/h.  Follow serum sodium level in a.m. 2. Dysphagia-seen by speech therapy, has mild aspiration risk and currently on dysphagia 3 diet. 3. Hypokalemia-potassium was 3.4, replace potassium and follow BMP in am. 4. Essential hypertension-continue amlodipine, blood pressure is elevated.  HCTZ and lisinopril on hold due to AKI.  Continue as needed hydralazine. 5. Diabetes mellitus type 2-continue sliding scale insulin with NovoLog.  CBG well controlled. 6. Acute kidney injury on CKD stage IIIb-patient's creatinine was around 1.9 last month.  Presented with creatinine of 2.4.  Today creatinine 1.53,  follow BMP in AM.  Continue gentle IV hydration. 7. Urinalysis weakness-secondary to CVA, residual deficits of right-sided weakness.  PT recommends skilled nursing facility.  Continue statin and Eliquis. 8. Encephalopathy-unclear etiology, could be from dehydration.  MRI brain showed no acute abnormality.  Will obtain UA and urine culture.     COVID-19 Labs  No results  for input(s): DDIMER, FERRITIN, LDH, CRP in the last 72 hours.  Lab Results  Component Value Date   SARSCOV2NAA NEGATIVE 01/23/2020   SARSCOV2NAA NEGATIVE 01/04/2020   SARSCOV2NAA NEGATIVE 11/29/2019     Scheduled medications:   . amLODipine  10 mg Oral Daily  . apixaban  5 mg Oral BID  . atorvastatin  80 mg Oral Daily  . insulin aspart  0-9 Units Subcutaneous TID WC  . sodium chloride flush  3 mL Intravenous Q12H         CBG: Recent Labs  Lab 01/26/20 0752 01/26/20 1135 01/26/20 1656 01/26/20 2105 01/27/20 0850  GLUCAP 107* 122* 178* 103* 172*    SpO2: 100 %    CBC: Recent Labs  Lab 01/23/20 1722 01/24/20 0500 01/25/20 0221  WBC 5.4 5.0 4.2  NEUTROABS  --   --  2.2  HGB 18.1* 15.3 14.0  HCT 57.3* 47.2 43.8  MCV 92.6 91.8 91.8  PLT 144* 113* 95*    Basic Metabolic Panel: Recent Labs  Lab 01/23/20 1722 01/24/20 0500 01/24/20 1417 01/25/20 0221 01/25/20 1136 01/25/20 1757 01/26/20 0136 01/26/20 0724 01/26/20 1215 01/27/20 0155  NA 152* 151*   < > 149*   < > 147* 145 147* 145 146*  K 4.0 4.4  --  3.9  --   --   --   --   --  3.4*  CL 108 110  --  111  --   --   --   --   --  110  CO2 26 29  --  27  --   --   --   --   --  27  GLUCOSE 137* 155*  --  136*  --   --   --   --   --  127*  BUN 80* 86*  --  67*  --   --   --   --   --  32*  CREATININE 2.44* 2.67*  --  2.12*  --   --   --   --   --  1.53*  CALCIUM 10.5* 9.7  --  9.4  --   --   --   --   --  8.9  MG  --   --   --  2.5*  --   --   --   --   --   --    < > = values in this interval not displayed.     Liver Function Tests: Recent Labs  Lab 01/24/20 0500  AST 17  ALT 10  ALKPHOS 60  BILITOT 1.1  PROT 7.3  ALBUMIN 3.8     Antibiotics: Anti-infectives (From admission, onward)   None       DVT prophylaxis: Apixaban  Code Status: Full code  Family Communication: No family at bedside   Consultants:  Procedures:      Objective   Vitals:   01/27/20 0459  01/27/20 0610 01/27/20 0612 01/27/20 1110  BP: (!) 182/104 (!) 193/88 (!) 195/95 (!) 170/91  Pulse: (!) 51 (!) 56 (!) 56 (!) 54  Resp: 16   18  Temp: 98.2 F (36.8 C)     TempSrc:      SpO2: 100%   100%  Weight:        Intake/Output Summary (Last 24 hours) at 01/27/2020 1359 Last data filed at 01/27/2020 0738 Gross per 24 hour  Intake 4592.68 ml  Output 500 ml  Net 4092.68 ml    01/14 1901 - 01/16 0700 In: 4180.2 [P.O.:520; I.V.:3660.2] Out: 900 [Urine:900]  Filed Weights   01/26/20 1418  Weight: 69.7 kg    Physical Examination:  General-appears in no acute distress Heart-S1-S2, regular, no murmur auscultated Lungs-clear to auscultation bilaterally, no wheezing or crackles auscultated Abdomen-soft, nontender, no organomegaly Extremities-no edema in the lower extremities Neuro-alert, oriented x 2, no focal deficit noted  Status is: Inpatient  Dispo: The patient is from: Home              Anticipated d/c is to: Skilled nursing facility              Anticipated d/c date is: 01/29/2020              Patient currently medically stable for discharge  Barrier to discharge-awaiting bed at skilled nursing facility       Data Reviewed:   Recent Results (from the past 240 hour(s))  SARS CORONAVIRUS 2 (TAT 6-24 HRS) Nasopharyngeal Nasopharyngeal Swab     Status: None   Collection Time: 01/23/20  9:47 PM   Specimen: Nasopharyngeal Swab  Result Value Ref Range Status   SARS Coronavirus 2 NEGATIVE NEGATIVE Final    Comment: (NOTE) SARS-CoV-2 target nucleic acids are NOT DETECTED.  The SARS-CoV-2 RNA is generally detectable in upper and lower respiratory specimens during the acute phase of infection. Negative results do not preclude SARS-CoV-2 infection, do not rule out co-infections with other pathogens, and should not be used as the sole basis for treatment or other patient management decisions. Negative results must be combined with clinical observations, patient  history, and epidemiological information. The expected result is Negative.  Fact Sheet for Patients: HairSlick.no  Fact Sheet for Healthcare Providers: quierodirigir.com  This test is not yet approved or cleared by the [place] FDA and  has been authorized for detection and/or diagnosis of SARS-CoV-2 by FDA under an Emergency Use Authorization (EUA). This EUA will remain  in effect (meaning this test can be used) for the duration of the COVID-19 declaration under Se ction 564(b)(1) of the Act, 21 U.S.C. section 360bbb-3(b)(1), unless the authorization is terminated or revoked sooner.  Performed at [facility], [address] [place], [place] [address]     No results for input(s): LIPASE, AMYLASE in the last 168 hours. No results for input(s): AMMONIA in the last 168 hours.  Cardiac Enzymes: No results for input(s): CKTOTAL, CKMB, CKMBINDEX, TROPONINI in the last 168 hours. BNP (last 3 results) Recent Labs    11/29/19 0037 01/04/20 1339 01/07/20 1549  BNP 1,278.7* 465.9* 46.1    ProBNP (last 3 results) No results for input(s): PROBNP in the last 8760 hours.  Studies:  MR BRAIN WO CONTRAST  Result Date: 01/27/2020 CLINICAL DATA:  Memory loss.  Encephalopathy. EXAM: MRI HEAD WITHOUT CONTRAST TECHNIQUE: Multiplanar, multiecho pulse sequences of the brain and surrounding structures were obtained without intravenous contrast. COMPARISON:  MRI January 04, 2020. FINDINGS: Brain: No acute infarct. Multiple remote infarcts in bilateral basal ganglia, bilateral corona radiata, and the left cerebellar hemisphere. Additional patchy T2/FLAIR hyperintensities within the white matter, nonspecific but most likely related to chronic microvascular ischemic disease. Generalized cerebral volume loss with ex vacuo ventricular dilation. No hydrocephalus. No acute hemorrhage. No mass lesion or abnormal mass effect. Similar chronic  microhemorrhage in the right frontal lobe. Vascular: Major arterial flow voids are maintained at the skull base. Skull and upper cervical spine: Normal marrow signal. Sinuses/Orbits: Retention cyst in bilateral maxillary sinuses. Unremarkable orbits. Other: No mastoid effusions. IMPRESSION: 1. No acute abnormality. 2. Multiple infratentorial and supratentorial remote lacunar infarcts, as described above. Electronically Signed   By: Feliberto Harts MD   On: 01/27/2020 13:34       Meredeth Ide   [facility] If 7PM-7AM, please contact night-coverage at [email or web address], Office  [phone]   01/27/2020, 1:59 PM  LOS: 3 days

## 2020-01-27 NOTE — Progress Notes (Signed)
Pt a/o to self. He ate about half of his breakfast and lunch.  He has been calm.  Pt takes meds with pudding.

## 2020-01-28 DIAGNOSIS — E119 Type 2 diabetes mellitus without complications: Secondary | ICD-10-CM

## 2020-01-28 DIAGNOSIS — I509 Heart failure, unspecified: Secondary | ICD-10-CM

## 2020-01-28 LAB — URINALYSIS, ROUTINE W REFLEX MICROSCOPIC
Bilirubin Urine: NEGATIVE
Glucose, UA: 50 mg/dL — AB
Ketones, ur: 5 mg/dL — AB
Leukocytes,Ua: NEGATIVE
Nitrite: NEGATIVE
Protein, ur: NEGATIVE mg/dL
Specific Gravity, Urine: 1.015 (ref 1.005–1.030)
pH: 6 (ref 5.0–8.0)

## 2020-01-28 LAB — GLUCOSE, CAPILLARY
Glucose-Capillary: 118 mg/dL — ABNORMAL HIGH (ref 70–99)
Glucose-Capillary: 137 mg/dL — ABNORMAL HIGH (ref 70–99)
Glucose-Capillary: 194 mg/dL — ABNORMAL HIGH (ref 70–99)

## 2020-01-28 LAB — BASIC METABOLIC PANEL
Anion gap: 10 (ref 5–15)
BUN: 19 mg/dL (ref 6–20)
CO2: 24 mmol/L (ref 22–32)
Calcium: 9 mg/dL (ref 8.9–10.3)
Chloride: 105 mmol/L (ref 98–111)
Creatinine, Ser: 1.19 mg/dL (ref 0.61–1.24)
GFR, Estimated: >60 mL/min (ref >60)
Glucose, Bld: 130 mg/dL — ABNORMAL HIGH (ref 70–99)
Potassium: 3.2 mmol/L — ABNORMAL LOW (ref 3.5–5.1)
Sodium: 139 mmol/L (ref 135–145)

## 2020-01-28 LAB — CBC
HCT: 43.4 % (ref 39.0–52.0)
Hemoglobin: 13.8 g/dL (ref 13.0–17.0)
MCH: 28.3 pg (ref 26.0–34.0)
MCHC: 31.8 g/dL (ref 30.0–36.0)
MCV: 89.1 fL (ref 80.0–100.0)
Platelets: 80 10*3/uL — ABNORMAL LOW (ref 150–400)
RBC: 4.87 MIL/uL (ref 4.22–5.81)
RDW: 11.9 % (ref 11.5–15.5)
WBC: 4.2 10*3/uL (ref 4.0–10.5)
nRBC: 0 % (ref 0.0–0.2)

## 2020-01-28 MED ORDER — ENALAPRILAT 1.25 MG/ML IV SOLN
0.6250 mg | Freq: Once | INTRAVENOUS | Status: AC
  Administered 2020-01-28: 0.625 mg via INTRAVENOUS
  Filled 2020-01-28: qty 0.5

## 2020-01-28 MED ORDER — POTASSIUM CHLORIDE CRYS ER 20 MEQ PO TBCR
40.0000 meq | EXTENDED_RELEASE_TABLET | Freq: Once | ORAL | Status: AC
  Administered 2020-01-28: 40 meq via ORAL
  Filled 2020-01-28: qty 2

## 2020-01-28 MED ORDER — LISINOPRIL 20 MG PO TABS
20.0000 mg | ORAL_TABLET | Freq: Every day | ORAL | Status: DC
  Administered 2020-01-28 – 2020-02-22 (×26): 20 mg via ORAL
  Filled 2020-01-28 (×27): qty 1

## 2020-01-28 NOTE — Progress Notes (Addendum)
Pt BP elevated throughout the night. RN gave 1 dose of hydralazine last night. BP did not make much of a change. RN notified Dr.Blount who ordered lisinopril. BP did not decrease. RN gave another dose of hydralazine and notified Dr.Blount. RN awaiting response. Pt denies any symptoms. RN will continue to monitor. RN never got a response from Dr.Blount on bp. BP still at 194/94. RN assessed the IV sites. RN notified charge nurse. Pt in no acute distress. RN will relay information to day shift nurse.

## 2020-01-28 NOTE — Progress Notes (Addendum)
PROGRESS NOTE  Kevin [name] [facility] KWI:[id number] DOB: 05/19/1965 DOA: 01/23/2020 PCP: [facility], [facility]   LOS: 4 days   Brief narrative: 55 year old male with medical history of systolic and diastolic heart failure, CKD, CVA, diabetes mellitus type 2, hypertension, left eye blindness presented to the hospital after generalized weakness since December at home.  He was supposed to follow-up with home health at home but due to lack of insurance/acceptance of cash he was unable to get any help.  Patient also complains of decreased oral intake and cognitive deficits since December.    Assessment/Plan:  Principal Problem:   Hypernatremia Active Problems:   Diabetes mellitus without complication (HCC)   History of CVA (cerebrovascular accident)   Essential hypertension   CKD (chronic kidney disease), stage III (HCC)   Chronic CHF (congestive heart failure) (HCC)   Acute hypernatremia   Hypovolemic hypernatremia-initial sodium of 152 likely secondary to poor oral intake and dysphagia.  Patient received IV fluids with improvement in sodium level at 139.  Patient is currently receiving half normal saline at 75 mL/h.  Dysphagia-seen by speech therapy, has mild aspiration risk and currently on dysphagia 3 diet.  Hypokalemia-potassium of 3.2 at this time.  Continue to replenish, check potassium in a.m.   Essential hypertension-continue amlodipine.  HCTZ and lisinopril on hold at this time.  Will resume lisinopril since renal function has improved.  Diabetes mellitus type 2-patient is on metformin at home.  We will continue sliding scale insulin every 6 while in the hospital.    Acute kidney injury on CKD stage IIIb-at baseline at this time.  Creatinine 1.1 for hydration.  Generalized weakness-secondary to CVA, residual deficits of right-sided weakness.  PT recommends skilled nursing facility.  Continue statin and Eliquis.  Metabolic encephalopathy-likely from volume depletion.  MRI brain showed no acute abnormality.   Urinalysis negative for infection. Mildly sleepy today.    DVT prophylaxis:  apixaban (ELIQUIS) tablet 5 mg    Code Status: Full code  Family Communication: none  Status is: Inpatient  Remains inpatient appropriate because:Need for skilled nursing facility placement.   Dispo: The patient is from: Home              Anticipated d/c is to: SNF              Anticipated d/c date is: 1 day or when bed is available              Patient currently is medically stable to d/c.    Consultants:  none  Procedures:  none  Anti-infectives:  . none  Anti-infectives (From admission, onward)   None      Subjective: Today, patient was seen and examined at bedside. Denies complains. Feels sleepy  Objective: Vitals:   01/28/20 0459 01/28/20 0500  BP: (!) 190/99   Pulse:  88  Resp:  (!) 22  Temp:  98.6 F (37 C)  SpO2:  100%    Intake/Output Summary (Last 24 hours) at 01/28/2020 0912 Last data filed at 01/28/2020 0600 Gross per 24 hour  Intake 120 ml  Output 1600 ml  Net -1480 ml   Filed Weights   01/26/20 1418 01/28/20 0500  Weight: 69.7 kg 70.1 kg   Body mass index is 19.84 kg/m.   Physical Exam:  GENERAL: Patient is alert awake but sleepy. Not in obvious distress, thinly built HENT: No scleral pallor or icterus. Pupils equally reactive to light. Oral mucosa is moist NECK: is supple, no gross  swelling noted. CHEST: Clear to auscultation. No crackles or wheezes.  Diminished breath sounds bilaterally. CVS: S1 and S2 heard, no murmur. Regular rate and rhythm.  ABDOMEN: Soft, non-tender, bowel sounds are present. EXTREMITIES: No edema. CNS: Cranial nerves are intact. Moves all the extremites SKIN: warm and dry without rashes.  Data Review: I have personally reviewed the following laboratory data and studies,  CBC: Recent Labs  Lab 01/23/20 1722 01/24/20 0500 01/25/20 0221 01/28/20 0249  WBC 5.4 5.0 4.2 4.2   NEUTROABS  --   --  2.2  --   HGB 18.1* 15.3 14.0 13.8  HCT 57.3* 47.2 43.8 43.4  MCV 92.6 91.8 91.8 89.1  PLT 144* 113* 95* 80*   Basic Metabolic Panel: Recent Labs  Lab 01/23/20 1722 01/24/20 0500 01/24/20 1417 01/25/20 0221 01/25/20 1136 01/26/20 0136 01/26/20 0724 01/26/20 1215 01/27/20 0155 01/28/20 0249  NA 152* 151*   < > 149*   < > 145 147* 145 146* 139  K 4.0 4.4  --  3.9  --   --   --   --  3.4* 3.2*  CL 108 110  --  111  --   --   --   --  110 105  CO2 26 29  --  27  --   --   --   --  27 24  GLUCOSE 137* 155*  --  136*  --   --   --   --  127* 130*  BUN 80* 86*  --  67*  --   --   --   --  32* 19  CREATININE 2.44* 2.67*  --  2.12*  --   --   --   --  1.53* 1.19  CALCIUM 10.5* 9.7  --  9.4  --   --   --   --  8.9 9.0  MG  --   --   --  2.5*  --   --   --   --   --   --    < > = values in this interval not displayed.   Liver Function Tests: Recent Labs  Lab 01/24/20 0500  AST 17  ALT 10  ALKPHOS 60  BILITOT 1.1  PROT 7.3  ALBUMIN 3.8   No results for input(s): LIPASE, AMYLASE in the last 168 hours. No results for input(s): AMMONIA in the last 168 hours. Cardiac Enzymes: No results for input(s): CKTOTAL, CKMB, CKMBINDEX, TROPONINI in the last 168 hours. BNP (last 3 results) Recent Labs    11/29/19 0037 01/04/20 1339 01/07/20 1549  BNP 1,278.7* 465.9* 46.1    ProBNP (last 3 results) No results for input(s): PROBNP in the last 8760 hours.  CBG: Recent Labs  Lab 01/26/20 1656 01/26/20 2105 01/27/20 0850 01/27/20 1817 01/27/20 2051  GLUCAP 178* 103* 172* 112* 133*   Recent Results (from the past 240 hour(s))  SARS CORONAVIRUS 2 (TAT 6-24 HRS) Nasopharyngeal Nasopharyngeal Swab     Status: None   Collection Time: 01/23/20  9:47 PM   Specimen: Nasopharyngeal Swab  Result Value Ref Range Status   SARS Coronavirus 2 NEGATIVE NEGATIVE Final    Comment: (NOTE) SARS-CoV-2 target nucleic acids are NOT DETECTED.  The SARS-CoV-2 RNA is  generally detectable in upper and lower respiratory specimens during the acute phase of infection. Negative results do not preclude SARS-CoV-2 infection, do not rule out co-infections with other pathogens, and should not be used as the sole basis for  treatment or other patient management decisions. Negative results must be combined with clinical observations, patient history, and epidemiological information. The expected result is Negative.  Fact Sheet for Patients: HairSlick.no  Fact Sheet for Healthcare Providers: quierodirigir.com  This test is not yet approved or cleared by the [place] FDA and  has been authorized for detection and/or diagnosis of SARS-CoV-2 by FDA under an Emergency Use Authorization (EUA). This EUA will remain  in effect (meaning this test can be used) for the duration of the COVID-19 declaration under Se ction 564(b)(1) of the Act, 21 U.S.C. section 360bbb-3(b)(1), unless the authorization is terminated or revoked sooner.  Performed at [facility], [address], [place], [place] [address]      Studies: MR BRAIN WO CONTRAST  Result Date: 01/27/2020 CLINICAL DATA:  Memory loss.  Encephalopathy. EXAM: MRI HEAD WITHOUT CONTRAST TECHNIQUE: Multiplanar, multiecho pulse sequences of the brain and surrounding structures were obtained without intravenous contrast. COMPARISON:  MRI January 04, 2020. FINDINGS: Brain: No acute infarct. Multiple remote infarcts in bilateral basal ganglia, bilateral corona radiata, and the left cerebellar hemisphere. Additional patchy T2/FLAIR hyperintensities within the white matter, nonspecific but most likely related to chronic microvascular ischemic disease. Generalized cerebral volume loss with ex vacuo ventricular dilation. No hydrocephalus. No acute hemorrhage. No mass lesion or abnormal mass effect. Similar chronic microhemorrhage in the right frontal lobe. Vascular:  Major arterial flow voids are maintained at the skull base. Skull and upper cervical spine: Normal marrow signal. Sinuses/Orbits: Retention cyst in bilateral maxillary sinuses. Unremarkable orbits. Other: No mastoid effusions. IMPRESSION: 1. No acute abnormality. 2. Multiple infratentorial and supratentorial remote lacunar infarcts, as described above. Electronically Signed   By: Feliberto Harts MD   On: 01/27/2020 13:34      Joycelyn Das, MD  [facility] 01/28/2020  If 7PM-7AM, please contact night-coverage

## 2020-01-28 NOTE — Progress Notes (Signed)
Physical Therapy Treatment Patient Details Name: Kevin [name] MRN: [id number] DOB: [birth year] Today's Date: 01/28/2020    History of Present Illness Pt is a 55 y/o male admitted secondary to weakness, generalized decline at home, and hyponatremia. PMH includes L eye blindness, DM, CVA, HTN, CHF, and CKD.    PT Comments    Pt was seen bedside with very low interaction with PT during the session.  Appears ready to fall asleep, but has verbally agreed to therapy.  Feels he has not had enough rest, so continuing the session next time to promote more transition to get up in chair.  Pt will be seen acutely for goals of PT and to encourage more active use of legs, increase time in chair and to increase standing balance control. Pt is repositioned to take advantage of more upright head posture to increase interaction with staff and facilitate eating a meal.  Follow Up Recommendations  SNF;Supervision/Assistance - 24 hour     Equipment Recommendations  Other (comment)    Recommendations for Other Services       Precautions / Restrictions Precautions Precautions: Fall Precaution Comments: lethargic Restrictions Weight Bearing Restrictions: No    Mobility  Bed Mobility Overal bed mobility: Needs Assistance             General bed mobility comments: scooting up in bed total assist, pt is not following along with cues to assist  Transfers                 General transfer comment: declined OOB  Ambulation/Gait                 Stairs             Wheelchair Mobility    Modified Rankin (Stroke Patients Only)       Balance Overall balance assessment: Needs assistance                                          Cognition Arousal/Alertness: Lethargic Behavior During Therapy: Flat affect Overall Cognitive Status: No family/caregiver present to determine baseline cognitive functioning Area of Impairment: Awareness;Following  commands;Attention                   Current Attention Level: Selective Memory: Decreased short-term memory Following Commands: Follows one step commands inconsistently;Follows one step commands with increased time Safety/Judgement: Decreased awareness of safety;Decreased awareness of deficits Awareness: Intellectual Problem Solving: Slow processing;Requires verbal cues;Requires tactile cues        Exercises General Exercises - Lower Extremity Heel Slides: Strengthening;AAROM;10 reps Hip ABduction/ADduction: AROM;Strengthening;10 reps Straight Leg Raises: AROM;Strengthening;10 reps Hip Flexion/Marching: AAROM;10 reps    General Comments General comments (skin integrity, edema, etc.): pt is not demonstrating any awareness of how to perpetuate instructions for ther ex.  He is literally requiring both physical and verbal cues to continue on with initial exercise instruction      Pertinent Vitals/Pain Pain Assessment: No/denies pain    Home Living                      Prior Function            PT Goals (current goals can now be found in the care plan section) Acute Rehab PT Goals Patient Stated Goal: not stated Progress towards PT goals: Not progressing toward goals - comment    Frequency  Min 2X/week      PT Plan Current plan remains appropriate    Co-evaluation              AM-PAC PT "6 Clicks" Mobility   Outcome Measure  Help needed turning from your back to your side while in a flat bed without using bedrails?: A Little Help needed moving from lying on your back to sitting on the side of a flat bed without using bedrails?: A Little Help needed moving to and from a bed to a chair (including a wheelchair)?: A Little Help needed standing up from a chair using your arms (e.g., wheelchair or bedside chair)?: A Lot Help needed to walk in hospital room?: A Lot Help needed climbing 3-5 steps with a railing? : A Lot 6 Click Score: 15    End  of Session   Activity Tolerance: Patient tolerated treatment well Patient left: in bed;with call bell/phone within reach;with bed alarm set Nurse Communication: Mobility status PT Visit Diagnosis: Unsteadiness on feet (R26.81);Muscle weakness (generalized) (M62.81)     Time: 9833-8250 PT Time Calculation (min) (ACUTE ONLY): 23 min  Charges:  $Therapeutic Exercise: 8-22 mins $Therapeutic Activity: 8-22 mins                   Ivar Drape 01/28/2020, 1:50 PM  Samul Dada, PT MS Acute Rehab Dept. Number: [facility] [id number] and [facility] [phone]

## 2020-01-28 NOTE — TOC Progression Note (Addendum)
Transition of Care [facility]) - Progression Note    Patient Details  Name: Kevin [name] MRN: [id number] Date of Birth: 05/30/1965  [facility] CM/SW Contact  [name], RN Phone Number: 01/28/2020, 2:26 PM  Clinical Narrative:     Patient nonverbal with NCM.   NCM called patient's mother Kevin [name] [phone].   Patient lives with his father Kevin [name]. Parents are divorced but patient's mother helps , patient's father with patients care.   They have applied for Medicaid 2 weeks ago , with plans of long term placement.   Patient does not have any DME at home at present.   NCM spoke to Kevin [name] with [facility] can provide charity HHPT/OT . Mother in agreement for patient to go home with home health until medicaid is approved. NCM called [facility] [phone] and left voicemail.   NCM secure chatted MD and PT ( for DME recommendations).  Secure chatted TOC leadership  Kevin [name] cell [phone]  he cannot take care of him home and provide care . Father says Medicaid is approved but he does not have the number. I told him we would need that information. He is coming to hospital tomorrow to see "what condition Kevin [name] is in "  [place]e called back , patient has not been approved for Medicaid , but has applied. Patient cannot go to father's home or mother's home at discharge. Patient's Medicaid social worker is Kevin [name] [phone]   Expected Discharge Plan: Home w Home Health Services    Expected Discharge Plan and Services Expected Discharge Plan: Home w Home Health Services   Discharge Planning Services: CM Consult Post Acute Care Choice: Home Health,Durable Medical Equipment Living arrangements for the past 2 months: Single Family Home                           HH Arranged: OT,PT HH Agency: [facility] Date [facility] Agency Contacted: 01/28/20 Time HH Agency Contacted: 1425 Representative spoke with at [facility]  Agency: [facility]andee Keen   Social Determinants of Health (SDOH) Interventions    Readmission Risk Interventions Readmission Risk Prevention Plan 01/08/2020  Transportation Screening Complete  PCP or Specialist Appt within 5-7 Days Complete  Home Care Screening Complete  Medication Review (RN CM) Complete  Some recent data might be hidden

## 2020-01-29 LAB — BASIC METABOLIC PANEL
Anion gap: 10 (ref 5–15)
BUN: 19 mg/dL (ref 6–20)
CO2: 24 mmol/L (ref 22–32)
Calcium: 8.6 mg/dL — ABNORMAL LOW (ref 8.9–10.3)
Chloride: 102 mmol/L (ref 98–111)
Creatinine, Ser: 1.24 mg/dL (ref 0.61–1.24)
GFR, Estimated: >60 mL/min (ref >60)
Glucose, Bld: 124 mg/dL — ABNORMAL HIGH (ref 70–99)
Potassium: 3.1 mmol/L — ABNORMAL LOW (ref 3.5–5.1)
Sodium: 136 mmol/L (ref 135–145)

## 2020-01-29 LAB — CBC
HCT: 40.7 % (ref 39.0–52.0)
Hemoglobin: 13.1 g/dL (ref 13.0–17.0)
MCH: 28.4 pg (ref 26.0–34.0)
MCHC: 32.2 g/dL (ref 30.0–36.0)
MCV: 88.3 fL (ref 80.0–100.0)
Platelets: 73 10*3/uL — ABNORMAL LOW (ref 150–400)
RBC: 4.61 MIL/uL (ref 4.22–5.81)
RDW: 11.9 % (ref 11.5–15.5)
WBC: 3.2 10*3/uL — ABNORMAL LOW (ref 4.0–10.5)
nRBC: 0 % (ref 0.0–0.2)

## 2020-01-29 LAB — GLUCOSE, CAPILLARY
Glucose-Capillary: 111 mg/dL — ABNORMAL HIGH (ref 70–99)
Glucose-Capillary: 182 mg/dL — ABNORMAL HIGH (ref 70–99)
Glucose-Capillary: 119 mg/dL — ABNORMAL HIGH (ref 70–99)
Glucose-Capillary: 119 mg/dL — ABNORMAL HIGH (ref 70–99)

## 2020-01-29 LAB — MAGNESIUM: Magnesium: 1.5 mg/dL — ABNORMAL LOW (ref 1.7–2.4)

## 2020-01-29 MED ORDER — POTASSIUM CHLORIDE 20 MEQ PO PACK
40.0000 meq | PACK | Freq: Two times a day (BID) | ORAL | Status: DC
  Administered 2020-01-29 (×2): 40 meq via ORAL
  Filled 2020-01-29 (×2): qty 2

## 2020-01-29 MED ORDER — KCL IN DEXTROSE-NACL 20-5-0.45 MEQ/L-%-% IV SOLN
INTRAVENOUS | Status: DC
  Filled 2020-01-29 (×2): qty 1000

## 2020-01-29 MED ORDER — MAGNESIUM SULFATE 2 GM/50ML IV SOLN
2.0000 g | Freq: Once | INTRAVENOUS | Status: AC
  Administered 2020-01-29: 2 g via INTRAVENOUS
  Filled 2020-01-29 (×2): qty 50

## 2020-01-29 MED ORDER — MAGNESIUM OXIDE 400 (241.3 MG) MG PO TABS
400.0000 mg | ORAL_TABLET | Freq: Two times a day (BID) | ORAL | Status: DC
  Administered 2020-01-29 – 2020-03-26 (×113): 400 mg via ORAL
  Filled 2020-01-29 (×114): qty 1

## 2020-01-29 MED ORDER — POTASSIUM CHLORIDE CRYS ER 20 MEQ PO TBCR
40.0000 meq | EXTENDED_RELEASE_TABLET | Freq: Two times a day (BID) | ORAL | Status: DC
  Filled 2020-01-29: qty 2

## 2020-01-29 MED ORDER — POTASSIUM CHLORIDE 2 MEQ/ML IV SOLN
INTRAVENOUS | Status: DC
  Filled 2020-01-29 (×3): qty 1000

## 2020-01-29 NOTE — TOC Progression Note (Addendum)
Transition of Care [facility]) - Progression Note    Patient Details  Name: Kevin [name] MRN: [id number] Date of Birth: October 30, 1965  Transition of Care [facility]) CM/SW Contact  [name], RN Phone Number: 01/29/2020, 10:29 AM  Clinical Narrative:     Spoken to [facility] leadership, advised to confirm patient has applied for medicaid . If confirmed can pursue letter of guarantee for SNF placement and to reach out to [place] at [facility] .    Called Neita Carp Mims [phone] , Adult Medicaid case worker and left message.   Called and emailed Cathlean Marseilles in financial counseling and left message. Also [employer].  Kenda Revels checking to confirm if medicaid application has been submitted   Patient's mother [name] aware. Patient has had both Pfizer vaccinations. She is not sure of the dates but patient's father has his card. Patient has not had booster.  Completed FL2 and faxed out   Spoke to [place] at [facility] and they are not accepting any letter of guarantee at this time.   Olegario Messier at [facility] not accepting LOG at this time.   Reached out to [facility] with [employer] awaiting response. Revonda Standard may be able to take if disability in place. NCM has called  Medicaid worker   Reached out to [place] awaiting call back. [facility] is not accepting any LOG at this time   [employer] unable to take LOG   Expected Discharge Plan: Home w Home Health Services    Expected Discharge Plan and Services Expected Discharge Plan: Home w Home Health Services   Discharge Planning Services: CM Consult Post Acute Care Choice: Home Health,Durable Medical Equipment Living arrangements for the past 2 months: Single Family Home                           HH Arranged: OT,PT HH Agency: [facility] Date [facility] Agency Contacted: 01/28/20 Time HH Agency Contacted: 1425 Representative spoke with at [facility] Agency: Kandee Keen   Social Determinants of Health (SDOH)  Interventions    Readmission Risk Interventions Readmission Risk Prevention Plan 01/08/2020  Transportation Screening Complete  PCP or Specialist Appt within 5-7 Days Complete  Home Care Screening Complete  Medication Review (RN CM) Complete  Some recent data might be hidden

## 2020-01-29 NOTE — NC FL2 (Signed)
[place] MEDICAID FL2 LEVEL OF CARE SCREENING TOOL     IDENTIFICATION  Patient Name: Kevin [name] [place] Birthdate: Jul 19, 1965 Sex: male Admission Date (Current Location): 01/23/2020  [facility] and [place] Number:  Producer, television/film/video and Address:  [facility] [place], [address] [place], [place] [address]      Provider Number: [id number]  Attending Physician Name and Address:  Joycelyn Das, MD  Relative Name and Phone Number:  mother Emilie Rutter [phone]    Current Level of Care: SNF Recommended Level of Care: Skilled Nursing Facility Prior Approval Number:    Date Approved/Denied:   PASRR Number: [id number]  Discharge Plan: SNF    Current Diagnoses: Patient Active Problem List   Diagnosis Date Noted  . Acute hypernatremia 01/24/2020  . Hypernatremia 01/23/2020  . Chronic CHF (congestive heart failure) (HCC) 01/23/2020  . CKD (chronic kidney disease), stage III (HCC) 01/08/2020  . Essential hypertension 01/05/2020  . Acute on chronic combined systolic and diastolic CHF (congestive heart failure) (HCC) 01/05/2020  . History of noncompliance with medical treatment 01/05/2020  . CVA (cerebral vascular accident) (HCC) 01/04/2020  . History of CVA (cerebrovascular accident) 01/04/2020  . Hypertensive emergency 11/29/2019  . Tobacco dependence 11/29/2019  . Marijuana abuse 11/29/2019  . Renal dysfunction 11/29/2019  . Hypokalemia 11/29/2019  . Diabetes mellitus without complication (HCC)     Orientation RESPIRATION BLADDER Height & Weight     Self,Situation,Place    Continent Weight: 71.9 kg Height:     BEHAVIORAL SYMPTOMS/MOOD NEUROLOGICAL BOWEL NUTRITION STATUS      Continent Diet (Dysphagia 3 mech soft thin liquid)  AMBULATORY STATUS COMMUNICATION OF NEEDS Skin   Extensive Assist Verbally Normal                       Personal Care Assistance Level of Assistance  Bathing,Feeding,Dressing Bathing Assistance: Maximum  assistance Feeding assistance: Limited assistance Dressing Assistance: Maximum assistance     Functional Limitations Info  Sight Sight Info: Adequate (left eye blindness)        SPECIAL CARE FACTORS FREQUENCY  PT (By licensed PT),OT (By licensed OT)     PT Frequency: five times a week OT Frequency: five times a week            Contractures Contractures Info: Not present    Additional Factors Info  Code Status,Allergies,Insulin Sliding Scale Code Status Info: full code Allergies Info: no known allergies   Insulin Sliding Scale Info: Novolog 0 to 9 units TID with meals       Current Medications (01/29/2020):  This is the current hospital active medication list Current Facility-Administered Medications  Medication Dose Route Frequency Provider Last Rate Last Admin  . acetaminophen (TYLENOL) tablet 650 mg  650 mg Oral Q6H PRN Synetta Fail, MD   650 mg at 01/28/20 6761   Or  . acetaminophen (TYLENOL) suppository 650 mg  650 mg Rectal Q6H PRN Synetta Fail, MD      . amLODipine (NORVASC) tablet 10 mg  10 mg Oral Daily Synetta Fail, MD   10 mg at 01/29/20 1021  . apixaban (ELIQUIS) tablet 5 mg  5 mg Oral BID Synetta Fail, MD   5 mg at 01/29/20 1021  . atorvastatin (LIPITOR) tablet 80 mg  80 mg Oral Daily Synetta Fail, MD   80 mg at 01/29/20 1022  . dextrose 5 % and 0.45% NaCl 1,000 mL with potassium chloride  20 mEq/L Pediatric IV infusion   Intravenous Continuous Pokhrel, Laxman, MD      . hydrALAZINE (APRESOLINE) injection 10 mg  10 mg Intravenous Q6H PRN Hughie Closs, MD   10 mg at 01/28/20 0459  . insulin aspart (novoLOG) injection 0-9 Units  0-9 Units Subcutaneous TID WC Synetta Fail, MD   2 Units at 01/27/20 1121  . lisinopril (ZESTRIL) tablet 20 mg  20 mg Oral Daily Pokhrel, Laxman, MD   20 mg at 01/29/20 1022  . magnesium oxide (MAG-OX) tablet 400 mg  400 mg Oral BID Pokhrel, Laxman, MD   400 mg at 01/29/20 1022  . magnesium  sulfate IVPB 2 g 50 mL  2 g Intravenous Once Pokhrel, Laxman, MD 50 mL/hr at 01/29/20 1035 2 g at 01/29/20 1035  . polyethylene glycol (MIRALAX / GLYCOLAX) packet 17 g  17 g Oral Daily PRN Synetta Fail, MD      . potassium chloride (KLOR-CON) packet 40 mEq  40 mEq Oral BID Pokhrel, Laxman, MD   40 mEq at 01/29/20 1022  . sodium chloride flush (NS) 0.9 % injection 3 mL  3 mL Intravenous Q12H Synetta Fail, MD   3 mL at 01/29/20 1023     Discharge Medications: Please see discharge summary for a list of discharge medications.  Relevant Imaging Results:  Relevant Lab Results:   Additional Information SS [id number] , has had both covid pfizer vaccinations, CKD stage 3, DM2, HLD, CVA in Dec 2021 right sided weakness , has applied for medicaid two weeks ago , letter of guarantee, room air  Acxel Dingee, Adria Devon, RN

## 2020-01-29 NOTE — Progress Notes (Signed)
Occupational Therapy Treatment Patient Details Name: Kevin [name] MRN: [id number] DOB: Jun 14, 1965 Today's Date: 01/29/2020    History of present illness Pt is a 55 y/o male admitted secondary to weakness, generalized decline at home, and hyponatremia. PMH includes L eye blindness, DM, CVA, HTN, CHF, and CKD.   OT comments  Pt making gradual progress towards OT goals this session. Pt lethargic at start of session but awakens more as session progresses with pt agreeable to OOB mobility. Pt continues to present with generalized deconditioning and cognitive deficits impacting pts ability to complete BADLs independently. MIN A for simulated toilet transfer from EOB >recliner with RW. Pt required step by step verbal and tactile cues to sequence steps ~ 77ft to recliner. Pt mostly nonverbal during session but does respond yes and no appropriately. Pt would continue to benefit from skilled occupational therapy while admitted and after d/c to address the below listed limitations in order to improve overall functional mobility and facilitate independence with BADL participation. DC plan remains appropriate, will follow acutely per POC.     Follow Up Recommendations  Supervision/Assistance - 24 hour;SNF    Equipment Recommendations  Other (comment) (TBD at next venue of care)    Recommendations for Other Services      Precautions / Restrictions Precautions Precautions: Fall Restrictions Weight Bearing Restrictions: No       Mobility Bed Mobility Overal bed mobility: Needs Assistance Bed Mobility: Supine to Sit     Supine to sit: Min assist;HOB elevated     General bed mobility comments: light MIN A, mostly to facilitate initiation of task  Transfers Overall transfer level: Needs assistance Equipment used: Rolling walker (2 wheeled) Transfers: Sit to/from [employer] Sit to Stand: Min assist Stand pivot transfers: Min assist       General transfer comment: MIN A  to power up from EOB and cues for hand placement, MIN A to pivot ~ 23ft to recliner needing step by step cues to sequence steps,at times needing tactile cues to step R foot and then L    Balance Overall balance assessment: Needs assistance Sitting-balance support: No upper extremity supported;Feet supported Sitting balance-Leahy Scale: Fair     Standing balance support: Bilateral upper extremity supported Standing balance-Leahy Scale: Poor Standing balance comment: Reliant on external support and BUE on RW                           ADL either performed or assessed with clinical judgement   ADL Overall ADL's : Needs assistance/impaired                         Toilet Transfer: RW;Minimal assistance;Ambulation Toilet Transfer Details (indicate cue type and reason): simulated via functional mobility with RW and min A to sequence steps to recliner, pt requried tactile cues at times         Functional mobility during ADLs: Minimal assistance;Rolling walker General ADL Comments: pt very slow to process with cognition being a very limiting factor to ADL engagement     Vision       Perception     Praxis      Cognition Arousal/Alertness: Lethargic;Awake/alert (moments of lethargy and moments of wakefulness) Behavior During Therapy: Flat affect Overall Cognitive Status: No family/caregiver present to determine baseline cognitive functioning  General Comments: pt very slow to respond and follow one step commands needing increased time and tactiles cues at times to intiate mobility tasks        Exercises     Shoulder Instructions       General Comments      Pertinent Vitals/ Pain       Pain Assessment: Faces Faces Pain Scale: No hurt  Home Living                                          Prior Functioning/Environment              Frequency  Min 2X/week        Progress Toward  Goals  OT Goals(current goals can now be found in the care plan section)  Progress towards OT goals: Progressing toward goals  Acute Rehab OT Goals Patient Stated Goal: not stated OT Goal Formulation: With patient Time For Goal Achievement: 02/08/20 Potential to Achieve Goals: Good  Plan Discharge plan remains appropriate;Frequency remains appropriate    Co-evaluation                 AM-PAC OT "6 Clicks" Daily Activity     Outcome Measure   Help from another person eating meals?: A Little Help from another person taking care of personal grooming?: A Little   Help from another person bathing (including washing, rinsing, drying)?: A Lot Help from another person to put on and taking off regular upper body clothing?: A Lot Help from another person to put on and taking off regular lower body clothing?: Total 6 Click Score: 11    End of Session Equipment Utilized During Treatment: Gait belt;Rolling walker  OT Visit Diagnosis: Unsteadiness on feet (R26.81);Cognitive communication deficit (R41.841);Low vision, both eyes (H54.2);Other symptoms and signs involving cognitive function;Muscle weakness (generalized) (M62.81);Other abnormalities of gait and mobility (R26.89)   Activity Tolerance Patient limited by lethargy   Patient Left in chair;with call bell/phone within reach;with chair alarm set   Nurse Communication Mobility status        Time: 1610-9604 OT Time Calculation (min): 17 min  Charges: OT General Charges $OT Visit: 1 Visit OT Treatments $Self Care/Home Management : 8-22 mins  Lenor Derrick., COTA/L Acute Rehabilitation Services [phone]    Barron Schmid 01/29/2020, 4:47 PM

## 2020-01-29 NOTE — Plan of Care (Signed)
  Problem: Education: Goal: Knowledge of General Education information will improve Description: Including pain rating scale, medication(s)/side effects and non-pharmacologic comfort measures Outcome: Progressing   Problem: Clinical Measurements: Goal: Will remain free from infection Outcome: Progressing Goal: Diagnostic test results will improve Outcome: Progressing Goal: Respiratory complications will improve Outcome: Progressing Goal: Cardiovascular complication will be avoided Outcome: Progressing   Problem: Activity: Goal: Risk for activity intolerance will decrease Outcome: Progressing   Problem: Nutrition: Goal: Adequate nutrition will be maintained Outcome: Progressing   Problem: Coping: Goal: Level of anxiety will decrease Outcome: Progressing   Problem: Pain Managment: Goal: General experience of comfort will improve Outcome: Progressing   Problem: Safety: Goal: Ability to remain free from injury will improve Outcome: Progressing   Problem: Skin Integrity: Goal: Risk for impaired skin integrity will decrease Outcome: Progressing   

## 2020-01-29 NOTE — Progress Notes (Signed)
PROGRESS NOTE  Kevin [name] [facility] HYQ:[id number] DOB: 1965-12-15 DOA: 01/23/2020 PCP: Medicine, [facility]   LOS: 5 days   Brief narrative: 55 year old male with medical history of systolic and diastolic heart failure, CKD, CVA, diabetes mellitus type 2, hypertension, left eye blindness presented to the hospital after generalized weakness since December at home.  He was supposed to follow-up with home health at home but due to lack of insurance/acceptance of cash he was unable to get any help.  Patient also complains of decreased oral intake and cognitive deficits since December.    Assessment/Plan:  Principal Problem:   Hypernatremia Active Problems:   Diabetes mellitus without complication (HCC)   History of CVA (cerebrovascular accident)   Essential hypertension   CKD (chronic kidney disease), stage III (HCC)   Chronic CHF (congestive heart failure) (HCC)   Acute hypernatremia   Hypovolemic hypernatremia-initial sodium of 152 likely secondary to poor oral intake and dysphagia.  Patient received IV fluids with improvement in sodium level at 136.  Patient is currently receiving half normal saline at 75 mL/h.  Dysphagia-seen by speech therapy, has mild aspiration risk and currently on dysphagia 3 diet.  Hypokalemia-potassium of 3.1 at this time.  Continue to replenish, check potassium in a.m. change IV fluids to D5 half-normal saline with IV potassium. change Oral potassium to twice daily  Hypomagnesemia.  Magnesium of 1.5.  Will replace IV magnesium sulfate and oral magnesium  Essential hypertension-continue amlodipine.  HCTZ and lisinopril on hold at this time.  Has been restarted on lisinopril.  Diabetes mellitus type 2-patient is on metformin at home.  We will continue sliding scale insulin, Accu-Cheks while in the hospital.  Latest POC glucose of 194.  Acute kidney injury on CKD stage IIIb-at baseline at this time.  Creatinine today at the 1.2 after  hydration.  Generalized weakness-secondary to CVA,  PT recommends skilled nursing facility.  Continue statin and Eliquis.  Metabolic encephalopathy-likely from volume depletion. MRI brain showed no acute abnormality.   UA was negative.  Urine culture showed gram-negative rods.  No urinary symptoms reported by the patient.  Will not treat.  DVT prophylaxis:  apixaban (ELIQUIS) tablet 5 mg    Code Status: Full code  Family Communication: none  Status is: Inpatient  Remains inpatient appropriate because: Unsafe  disposition, need for  placement.  Dispo: The patient is from: Home              Anticipated d/c is to: SNF              Anticipated d/c date is: 1 day or when bed is available              Patient currently is  not medically stable to d/c today due to electrolyte imbalance.    Consultants:  none  Procedures:  none  Anti-infectives:  . none  Anti-infectives (From admission, onward)   None      Subjective: Today, patient was seen and examined at bedside.  Complains of generalized weakness.  Denies any nausea vomiting shortness of breath cough  Objective: Vitals:   01/28/20 1519 01/29/20 0534  BP: 134/89 (!) 141/89  Pulse: 75 79  Resp: 20 17  Temp: 99.3 F (37.4 C) 98.2 F (36.8 C)  SpO2: 100% 96%    Intake/Output Summary (Last 24 hours) at 01/29/2020 0811 Last data filed at 01/29/2020 0534 Gross per 24 hour  Intake 63 ml  Output 400 ml  Net -337 ml  Filed Weights   01/26/20 1418 01/28/20 0500 01/29/20 0534  Weight: 69.7 kg 70.1 kg 71.9 kg   Body mass index is 20.35 kg/m.   Physical Exam:  GENERAL: Patient is alert awake, communicative, not in obvious distress, thinly built HENT: No scleral pallor or icterus. Pupils equally reactive to light. Oral mucosa is moist NECK: is supple, no gross swelling noted. CHEST: Clear to auscultation. No crackles or wheezes.  Diminished breath sounds bilaterally. CVS: S1 and S2 heard, no murmur. Regular  rate and rhythm.  ABDOMEN: Soft, non-tender, bowel sounds are present. EXTREMITIES: No edema. CNS: Cranial nerves are intact. Moves all the extremities.  Generalized weakness. SKIN: warm and dry without rashes.  Data Review: I have personally reviewed the following laboratory data and studies,  CBC: Recent Labs  Lab 01/23/20 1722 01/24/20 0500 01/25/20 0221 01/28/20 0249 01/29/20 0209  WBC 5.4 5.0 4.2 4.2 3.2*  NEUTROABS  --   --  2.2  --   --   HGB 18.1* 15.3 14.0 13.8 13.1  HCT 57.3* 47.2 43.8 43.4 40.7  MCV 92.6 91.8 91.8 89.1 88.3  PLT 144* 113* 95* 80* 73*   Basic Metabolic Panel: Recent Labs  Lab 01/24/20 0500 01/24/20 1417 01/25/20 0221 01/25/20 1136 01/26/20 0724 01/26/20 1215 01/27/20 0155 01/28/20 0249 01/29/20 0209  NA 151*   < > 149*   < > 147* 145 146* 139 136  K 4.4  --  3.9  --   --   --  3.4* 3.2* 3.1*  CL 110  --  111  --   --   --  110 105 102  CO2 29  --  27  --   --   --  27 24 24   GLUCOSE 155*  --  136*  --   --   --  127* 130* 124*  BUN 86*  --  67*  --   --   --  32* 19 19  CREATININE 2.67*  --  2.12*  --   --   --  1.53* 1.19 1.24  CALCIUM 9.7  --  9.4  --   --   --  8.9 9.0 8.6*  MG  --   --  2.5*  --   --   --   --   --  1.5*   < > = values in this interval not displayed.   Liver Function Tests: Recent Labs  Lab 01/24/20 0500  AST 17  ALT 10  ALKPHOS 60  BILITOT 1.1  PROT 7.3  ALBUMIN 3.8   No results for input(s): LIPASE, AMYLASE in the last 168 hours. No results for input(s): AMMONIA in the last 168 hours. Cardiac Enzymes: No results for input(s): CKTOTAL, CKMB, CKMBINDEX, TROPONINI in the last 168 hours. BNP (last 3 results) Recent Labs    11/29/19 0037 01/04/20 1339 01/07/20 1549  BNP 1,278.7* 465.9* 46.1    ProBNP (last 3 results) No results for input(s): PROBNP in the last 8760 hours.  CBG: Recent Labs  Lab 01/27/20 1817 01/27/20 2051 01/28/20 1115 01/28/20 1632 01/28/20 2001  GLUCAP 112* 133* 118* 137*  194*   Recent Results (from the past 240 hour(s))  SARS CORONAVIRUS 2 (TAT 6-24 HRS) Nasopharyngeal Nasopharyngeal Swab     Status: None   Collection Time: 01/23/20  9:47 PM   Specimen: Nasopharyngeal Swab  Result Value Ref Range Status   SARS Coronavirus 2 NEGATIVE NEGATIVE Final    Comment: (NOTE) SARS-CoV-2 target nucleic acids  are NOT DETECTED.  The SARS-CoV-2 RNA is generally detectable in upper and lower respiratory specimens during the acute phase of infection. Negative results do not preclude SARS-CoV-2 infection, do not rule out co-infections with other pathogens, and should not be used as the sole basis for treatment or other patient management decisions. Negative results must be combined with clinical observations, patient history, and epidemiological information. The expected result is Negative.  Fact Sheet for Patients: HairSlick.no  Fact Sheet for Healthcare Providers: quierodirigir.com  This test is not yet approved or cleared by the [place] FDA and  has been authorized for detection and/or diagnosis of SARS-CoV-2 by FDA under an Emergency Use Authorization (EUA). This EUA will remain  in effect (meaning this test can be used) for the duration of the COVID-19 declaration under Se ction 564(b)(1) of the Act, 21 U.S.C. section 360bbb-3(b)(1), unless the authorization is terminated or revoked sooner.  Performed at [facility], [address], [place], [place] [address]      Studies: MR BRAIN WO CONTRAST  Result Date: 01/27/2020 CLINICAL DATA:  Memory loss.  Encephalopathy. EXAM: MRI HEAD WITHOUT CONTRAST TECHNIQUE: Multiplanar, multiecho pulse sequences of the brain and surrounding structures were obtained without intravenous contrast. COMPARISON:  MRI January 04, 2020. FINDINGS: Brain: No acute infarct. Multiple remote infarcts in bilateral basal ganglia, bilateral corona radiata, and the left  cerebellar hemisphere. Additional patchy T2/FLAIR hyperintensities within the white matter, nonspecific but most likely related to chronic microvascular ischemic disease. Generalized cerebral volume loss with ex vacuo ventricular dilation. No hydrocephalus. No acute hemorrhage. No mass lesion or abnormal mass effect. Similar chronic microhemorrhage in the right frontal lobe. Vascular: Major arterial flow voids are maintained at the skull base. Skull and upper cervical spine: Normal marrow signal. Sinuses/Orbits: Retention cyst in bilateral maxillary sinuses. Unremarkable orbits. Other: No mastoid effusions. IMPRESSION: 1. No acute abnormality. 2. Multiple infratentorial and supratentorial remote lacunar infarcts, as described above. Electronically Signed   By: Feliberto Harts MD   On: 01/27/2020 13:34      Joycelyn Das, MD  [facility] 01/29/2020  If 7PM-7AM, please contact night-coverage

## 2020-01-30 LAB — CBC
HCT: 38.4 % — ABNORMAL LOW (ref 39.0–52.0)
Hemoglobin: 12.3 g/dL — ABNORMAL LOW (ref 13.0–17.0)
MCH: 28.5 pg (ref 26.0–34.0)
MCHC: 32 g/dL (ref 30.0–36.0)
MCV: 88.9 fL (ref 80.0–100.0)
Platelets: 66 10*3/uL — ABNORMAL LOW (ref 150–400)
RBC: 4.32 MIL/uL (ref 4.22–5.81)
RDW: 11.8 % (ref 11.5–15.5)
WBC: 3.3 10*3/uL — ABNORMAL LOW (ref 4.0–10.5)
nRBC: 0 % (ref 0.0–0.2)

## 2020-01-30 LAB — COMPREHENSIVE METABOLIC PANEL
ALT: 10 U/L (ref 0–44)
AST: 16 U/L (ref 15–41)
Albumin: 2.9 g/dL — ABNORMAL LOW (ref 3.5–5.0)
Alkaline Phosphatase: 51 U/L (ref 38–126)
Anion gap: 6 (ref 5–15)
BUN: 21 mg/dL — ABNORMAL HIGH (ref 6–20)
CO2: 25 mmol/L (ref 22–32)
Calcium: 8.3 mg/dL — ABNORMAL LOW (ref 8.9–10.3)
Chloride: 106 mmol/L (ref 98–111)
Creatinine, Ser: 1.26 mg/dL — ABNORMAL HIGH (ref 0.61–1.24)
GFR, Estimated: >60 mL/min (ref >60)
Glucose, Bld: 129 mg/dL — ABNORMAL HIGH (ref 70–99)
Potassium: 4 mmol/L (ref 3.5–5.1)
Sodium: 137 mmol/L (ref 135–145)
Total Bilirubin: 0.9 mg/dL (ref 0.3–1.2)
Total Protein: 5.5 g/dL — ABNORMAL LOW (ref 6.5–8.1)

## 2020-01-30 LAB — PHOSPHORUS: Phosphorus: 2.5 mg/dL (ref 2.5–4.6)

## 2020-01-30 LAB — GLUCOSE, CAPILLARY
Glucose-Capillary: 114 mg/dL — ABNORMAL HIGH (ref 70–99)
Glucose-Capillary: 133 mg/dL — ABNORMAL HIGH (ref 70–99)
Glucose-Capillary: 187 mg/dL — ABNORMAL HIGH (ref 70–99)
Glucose-Capillary: 156 mg/dL — ABNORMAL HIGH (ref 70–99)
Glucose-Capillary: 197 mg/dL — ABNORMAL HIGH (ref 70–99)

## 2020-01-30 LAB — URINE CULTURE: Culture: >=100000 — AB

## 2020-01-30 LAB — MAGNESIUM: Magnesium: 1.9 mg/dL (ref 1.7–2.4)

## 2020-01-30 NOTE — Progress Notes (Addendum)
PROGRESS NOTE  Kevin [name] [facility] GQQ:[id number] DOB: Mar 29, 1965 DOA: 01/23/2020 PCP: Medicine, [facility]   LOS: 6 days   Brief narrative: 55 year old male with medical history of systolic and diastolic heart failure, CKD, CVA, diabetes mellitus type 2, hypertension, left eye blindness presented to the hospital after generalized weakness since December at home.  He was supposed to follow-up with home health at home but due to lack of insurance/acceptance of cash he was unable to get any help.  Patient also complains of decreased oral intake and cognitive deficits since December.    Assessment/Plan:  Principal Problem:   Hypernatremia Active Problems:   Diabetes mellitus without complication (HCC)   History of CVA (cerebrovascular accident)   Essential hypertension   CKD (chronic kidney disease), stage III (HCC)   Chronic CHF (congestive heart failure) (HCC)   Acute hypernatremia   Hypovolemic hypernatremia-initial sodium of 152 likely secondary to poor oral intake and dysphagia.  Patient received IV fluids with improvement in sodium level at 136.  Patient is currently receiving half normal saline at 75 mL/h.  Latest sodium level of 137.  Dysphagia-seen by speech therapy, has mild aspiration risk and currently on dysphagia 3 diet.  Hypokalemia-on  D5 half-normal saline with IV potassium.  Stable 4.0 today.  Discontinue oral potassium today.  Hypomagnesemia.  Improved.  Magnesium of 1.9 at this time.  Essential hypertension-continue amlodipine.  HCTZ and lisinopril on hold at this time.  Has been restarted on lisinopril.  Diabetes mellitus type 2-patient is on metformin at home.  We will continue sliding scale insulin, Accu-Cheks.  Latest POC glucose of 119.  Acute kidney injury on CKD stage IIIb-at baseline at this time.  Latest creatinine of 1.2.  Generalized weakness-secondary to CVA,  PT recommended skilled nursing facility.  Continue statin and  Eliquis.  Metabolic encephalopathy-likely from volume depletion.   Overall improved MRI brain showed no acute abnormality.   UA was negative.  Urine culture showed Enterobacter.  No urinary symptoms reported by the patient.  Will not treat abnormal urinalysis.  DVT prophylaxis:  apixaban (ELIQUIS) tablet 5 mg    Code Status: Full code  Family Communication: none  Status is: Inpatient  Remains inpatient appropriate because: Unsafe  disposition, need for  placement.  Dispo: The patient is from: Home              Anticipated d/c is to: SNF              Anticipated d/c date is: 1 day or when bed is available.  Transition of care on board.  Communicated with transition of care this morning.              Patient currently is  medically stable to d/c today   Consultants:  none  Procedures:  none  Anti-infectives:  . none  Anti-infectives (From admission, onward)   None      Subjective: Today, patient was seen and examined at bedside. Denies nausea, vomiting, but has generalized weakness.   Objective: Vitals:   01/29/20 2156 01/30/20 0506  BP: 139/84 (!) 156/89  Pulse: 74 66  Resp: 18 18  Temp: 98.4 F (36.9 C) 98.2 F (36.8 C)  SpO2: 100% 100%    Intake/Output Summary (Last 24 hours) at 01/30/2020 0811 Last data filed at 01/30/2020 0539 Gross per 24 hour  Intake 1099.11 ml  Output 800 ml  Net 299.11 ml   Filed Weights   01/28/20 0500 01/29/20 0534 01/30/20 0500  Weight: 70.1 kg 71.9 kg 71.4 kg   Body mass index is 20.21 kg/m.   Physical Exam:  GENERAL: Patient is alert awake, communicative, not in obvious distress, thinly built HENT: No scleral pallor or icterus. Pupils equally reactive to light. Oral mucosa is moist NECK: is supple, no gross swelling noted. CHEST: Clear to auscultation. No crackles or wheezes.  Diminished breath sounds bilaterally. CVS: S1 and S2 heard, no murmur. Regular rate and rhythm.  ABDOMEN: Soft, non-tender, bowel sounds are  present. EXTREMITIES: No edema. CNS: Cranial nerves are intact. Moves all the extremities.  Generalized weakness. SKIN: warm and dry without rashes.  Data Review: I have personally reviewed the following laboratory data and studies,  CBC: Recent Labs  Lab 01/24/20 0500 01/25/20 0221 01/28/20 0249 01/29/20 0209 01/30/20 0152  WBC 5.0 4.2 4.2 3.2* 3.3*  NEUTROABS  --  2.2  --   --   --   HGB 15.3 14.0 13.8 13.1 12.3*  HCT 47.2 43.8 43.4 40.7 38.4*  MCV 91.8 91.8 89.1 88.3 88.9  PLT 113* 95* 80* 73* 66*   Basic Metabolic Panel: Recent Labs  Lab 01/25/20 0221 01/25/20 1136 01/26/20 1215 01/27/20 0155 01/28/20 0249 01/29/20 0209 01/30/20 0152  NA 149*   < > 145 146* 139 136 137  K 3.9  --   --  3.4* 3.2* 3.1* 4.0  CL 111  --   --  110 105 102 106  CO2 27  --   --  27 24 24 25   GLUCOSE 136*  --   --  127* 130* 124* 129*  BUN 67*  --   --  32* 19 19 21*  CREATININE 2.12*  --   --  1.53* 1.19 1.24 1.26*  CALCIUM 9.4  --   --  8.9 9.0 8.6* 8.3*  MG 2.5*  --   --   --   --  1.5* 1.9  PHOS  --   --   --   --   --   --  2.5   < > = values in this interval not displayed.   Liver Function Tests: Recent Labs  Lab 01/24/20 0500 01/30/20 0152  AST 17 16  ALT 10 10  ALKPHOS 60 51  BILITOT 1.1 0.9  PROT 7.3 5.5*  ALBUMIN 3.8 2.9*   No results for input(s): LIPASE, AMYLASE in the last 168 hours. No results for input(s): AMMONIA in the last 168 hours. Cardiac Enzymes: No results for input(s): CKTOTAL, CKMB, CKMBINDEX, TROPONINI in the last 168 hours. BNP (last 3 results) Recent Labs    11/29/19 0037 01/04/20 1339 01/07/20 1549  BNP 1,278.7* 465.9* 46.1    ProBNP (last 3 results) No results for input(s): PROBNP in the last 8760 hours.  CBG: Recent Labs  Lab 01/28/20 2001 01/29/20 0818 01/29/20 1143 01/29/20 1614 01/29/20 2154  GLUCAP 194* 111* 182* 119* 119*   Recent Results (from the past 240 hour(s))  SARS CORONAVIRUS 2 (TAT 6-24 HRS) Nasopharyngeal  Nasopharyngeal Swab     Status: None   Collection Time: 01/23/20  9:47 PM   Specimen: Nasopharyngeal Swab  Result Value Ref Range Status   SARS Coronavirus 2 NEGATIVE NEGATIVE Final    Comment: (NOTE) SARS-CoV-2 target nucleic acids are NOT DETECTED.  The SARS-CoV-2 RNA is generally detectable in upper and lower respiratory specimens during the acute phase of infection. Negative results do not preclude SARS-CoV-2 infection, do not rule out co-infections with other pathogens, and should not be  used as the sole basis for treatment or other patient management decisions. Negative results must be combined with clinical observations, patient history, and epidemiological information. The expected result is Negative.  Fact Sheet for Patients: HairSlick.no  Fact Sheet for Healthcare Providers: quierodirigir.com  This test is not yet approved or cleared by the [place] FDA and  has been authorized for detection and/or diagnosis of SARS-CoV-2 by FDA under an Emergency Use Authorization (EUA). This EUA will remain  in effect (meaning this test can be used) for the duration of the COVID-19 declaration under Se ction 564(b)(1) of the Act, 21 U.S.C. section 360bbb-3(b)(1), unless the authorization is terminated or revoked sooner.  Performed at [facility], [address] [place], [place] [address]   Culture, Urine     Status: Abnormal   Collection Time: 01/28/20  6:32 AM   Specimen: Urine, Clean Catch  Result Value Ref Range Status   Specimen Description URINE, CLEAN CATCH  Final   Special Requests   Final    NONE Performed at [facility], [address], [place], [place] [address]    Culture >=100,000 COLONIES/mL ENTEROBACTER CLOACAE (A)  Final   Report Status 01/30/2020 FINAL  Final   Organism ID, Bacteria ENTEROBACTER CLOACAE (A)  Final      Susceptibility   Enterobacter cloacae - MIC*    CEFAZOLIN >=64  RESISTANT Resistant     CEFEPIME <=0.12 SENSITIVE Sensitive     CIPROFLOXACIN <=0.25 SENSITIVE Sensitive     GENTAMICIN <=1 SENSITIVE Sensitive     IMIPENEM 4 SENSITIVE Sensitive     NITROFURANTOIN 64 INTERMEDIATE Intermediate     TRIMETH/SULFA <=20 SENSITIVE Sensitive     PIP/TAZO <=4 SENSITIVE Sensitive     * >=100,000 COLONIES/mL ENTEROBACTER CLOACAE     Studies: No results found.    Joycelyn Das, MD  [facility] 01/30/2020  If 7PM-7AM, please contact night-coverage

## 2020-01-30 NOTE — Care Management (Addendum)
Spoken to [facility] leadership, advised to confirm patient has applied for medicaid . If confirmed can pursue letter of guarantee   Ladona Horns [phone] , Adult Medicaid case worker again and left another  message. To confirm patient parents have applied for medicaid and disability.   Kenda Revels checking to confirm if medicaid application has been submitted .      Reached out to [facility] with [employer] awaiting response. Revonda Standard may be able to take if disability in place. NCM has called  Medicaid worker x 2  to confirm, awaiting call back   [employer] called back and confirmed patient has applied for Medicaid , it has been pending since January 15, 2020. Instructed to call us [employer] to see if patient has applied for disability . [place] [employer] closed at 4 pm.   Called patient's mother [name] , husband answered and stated he does not know Alices cell phone number but she should be in [facility] room. NCM went to [facility] room no visitors present.    Ronny Flurry

## 2020-01-31 LAB — GLUCOSE, CAPILLARY
Glucose-Capillary: 108 mg/dL — ABNORMAL HIGH (ref 70–99)
Glucose-Capillary: 126 mg/dL — ABNORMAL HIGH (ref 70–99)
Glucose-Capillary: 148 mg/dL — ABNORMAL HIGH (ref 70–99)
Glucose-Capillary: 210 mg/dL — ABNORMAL HIGH (ref 70–99)

## 2020-01-31 LAB — BASIC METABOLIC PANEL
Anion gap: 6 (ref 5–15)
BUN: 15 mg/dL (ref 6–20)
CO2: 27 mmol/L (ref 22–32)
Calcium: 8.3 mg/dL — ABNORMAL LOW (ref 8.9–10.3)
Chloride: 107 mmol/L (ref 98–111)
Creatinine, Ser: 1.14 mg/dL (ref 0.61–1.24)
GFR, Estimated: >60 mL/min (ref >60)
Glucose, Bld: 124 mg/dL — ABNORMAL HIGH (ref 70–99)
Potassium: 3.9 mmol/L (ref 3.5–5.1)
Sodium: 140 mmol/L (ref 135–145)

## 2020-01-31 LAB — CBC
HCT: 36 % — ABNORMAL LOW (ref 39.0–52.0)
Hemoglobin: 11.5 g/dL — ABNORMAL LOW (ref 13.0–17.0)
MCH: 28.6 pg (ref 26.0–34.0)
MCHC: 31.9 g/dL (ref 30.0–36.0)
MCV: 89.6 fL (ref 80.0–100.0)
Platelets: 65 10*3/uL — ABNORMAL LOW (ref 150–400)
RBC: 4.02 MIL/uL — ABNORMAL LOW (ref 4.22–5.81)
RDW: 11.9 % (ref 11.5–15.5)
WBC: 3.5 10*3/uL — ABNORMAL LOW (ref 4.0–10.5)
nRBC: 0 % (ref 0.0–0.2)

## 2020-01-31 NOTE — Progress Notes (Signed)
Physical Therapy Treatment Patient Details Name: Kevin [name] MRN: [id number] DOB: 30-Sep-1965 Today's Date: 01/31/2020    History of Present Illness Pt is a 55 y/o male admitted secondary to weakness, generalized decline at home, and hyponatremia. PMH includes L eye blindness, DM, CVA, HTN, CHF, and CKD.    PT Comments    Patient just finishing bath with NT on arrival. Nurse tech assisting him up to EOB and pt alert. During session, had periods of eye-closing (including while walking!). Overall requires incr time and cues to complete mobility tasks due to decr cognition and lack of initiation. Patient did verbalize he wanted a drink at the end of session.     Follow Up Recommendations  SNF;Supervision/Assistance - 24 hour     Equipment Recommendations  Other (comment)    Recommendations for Other Services       Precautions / Restrictions Precautions Precautions: Fall Restrictions Weight Bearing Restrictions: No    Mobility  Bed Mobility Overal bed mobility: Needs Assistance Bed Mobility: Supine to Sit     Supine to sit: HOB elevated;Mod assist (NT assisting on arrival)     General bed mobility comments: may have required less assist if given more time to initiate; NT assisting on arrival  Transfers Overall transfer level: Needs assistance Equipment used: Rolling walker (2 wheeled) Transfers: Sit to/from Stand Sit to Stand: Min assist         General transfer comment: MIN A to power up from EOB and recliner (repeated cues for hand placement each time),  Ambulation/Gait Ambulation/Gait assistance: Min guard Gait Distance (Feet): 75 Feet Assistive device: Rolling walker (2 wheeled) Gait Pattern/deviations: Step-through pattern;Decreased stride length Gait velocity: decreased   General Gait Details: mildly unsteady, however self-corrects with use of RW; able to maneuver around obstacles in his room   Stairs             Wheelchair Mobility     Modified Rankin (Stroke Patients Only)       Balance Overall balance assessment: Needs assistance Sitting-balance support: No upper extremity supported;Feet supported Sitting balance-Leahy Scale: Fair     Standing balance support: No upper extremity supported Standing balance-Leahy Scale: Poor Standing balance comment: unsteady without UE support                            Cognition Arousal/Alertness: Lethargic;Awake/alert (moments of lethargy and moments of wakefulness) Behavior During Therapy: Flat affect Overall Cognitive Status: No family/caregiver present to determine baseline cognitive functioning Area of Impairment: Awareness;Following commands;Attention;Problem solving                 Orientation Level:  (NT--able to recall [facility] hospital after told) Current Attention Level: Sustained   Following Commands: Follows one step commands inconsistently;Follows one step commands with increased time Safety/Judgement: Decreased awareness of safety;Decreased awareness of deficits Awareness: Intellectual Problem Solving: Slow processing;Requires verbal cues;Requires tactile cues;Decreased initiation;Difficulty sequencing General Comments: when asked his name, replies "sleepy" (on multiple attempts); pt very slow to respond and follow one step commands needing increased time and tactiles cues at times to intiate mobility tasks; requires repeated cues to push from surface and not try to pull up on RW      Exercises      General Comments General comments (skin integrity, edema, etc.): requires incr time for all commands      Pertinent Vitals/Pain Pain Assessment: Faces Faces Pain Scale: No hurt    Home Living  Prior Function            PT Goals (current goals can now be found in the care plan section) Acute Rehab PT Goals Patient Stated Goal: not stated Time For Goal Achievement: 02/07/20 Potential to Achieve Goals:  Fair Progress towards PT goals: Progressing toward goals    Frequency    Min 2X/week      PT Plan Current plan remains appropriate    Co-evaluation              AM-PAC PT "6 Clicks" Mobility   Outcome Measure  Help needed turning from your back to your side while in a flat bed without using bedrails?: A Little Help needed moving from lying on your back to sitting on the side of a flat bed without using bedrails?: A Lot Help needed moving to and from a bed to a chair (including a wheelchair)?: A Little Help needed standing up from a chair using your arms (e.g., wheelchair or bedside chair)?: A Little Help needed to walk in hospital room?: A Little Help needed climbing 3-5 steps with a railing? : A Little 6 Click Score: 17    End of Session Equipment Utilized During Treatment: Gait belt Activity Tolerance: Patient tolerated treatment well Patient left: with call bell/phone within reach;in chair;with chair alarm set Nurse Communication: Mobility status PT Visit Diagnosis: Unsteadiness on feet (R26.81);Muscle weakness (generalized) (M62.81)     Time: 1610-9604 PT Time Calculation (min) (ACUTE ONLY): 19 min  Charges:  $Gait Training: 8-22 mins                      Jerolyn Center, PT Pager [phone]    Zena Amos 01/31/2020, 11:06 AM

## 2020-01-31 NOTE — Progress Notes (Signed)
PROGRESS NOTE  Kevin [name] [facility] NUU:[id number] DOB: [birth year] DOA: 01/23/2020 PCP: Medicine, [facility]   LOS: 7 days   Brief narrative: 55 year old male with medical history of systolic and diastolic heart failure, CKD, CVA, diabetes mellitus type 2, hypertension, left eye blindness presented to the hospital after generalized weakness since December at home.  He was supposed to follow-up with home health at home but due to lack of insurance/acceptance of cash he was unable to get any help.  Patient also complained of decreased oral intake and cognitive deficits since December.  Patient was then admitted to the hospital for further evaluation and treatment.  Assessment/Plan:  Principal Problem:   Hypernatremia Active Problems:   Diabetes mellitus without complication (HCC)   History of CVA (cerebrovascular accident)   Essential hypertension   CKD (chronic kidney disease), stage III (HCC)   Chronic CHF (congestive heart failure) (HCC)   Acute hypernatremia  Hypovolemic hypernatremia-initial sodium of 152 likely secondary to poor oral intake and dysphagia.  Patient received IV fluids with improvement in sodium level.  Current sodium level of 140.  We will discontinue IV fluids   Dysphagia-seen by speech therapy, has mild aspiration risk and currently on dysphagia 3 diet.  Encouraged oral.  Hypokalemia-on  D5 half-normal saline with IV potassium.  Stable at 3.9 today.  Discontinue IV fluids today.  Hypomagnesemia.  Improved with replacement.  Check magnesium levels in a.m.  Essential hypertension-continue amlodipine.  On HCTZ and lisinopril at home.  Patient has  been restarted on lisinopril.  Consider discontinuation of HCTZ on discharge  Diabetes mellitus type 2- patient is on metformin at home.  We will continue sliding scale insulin, Accu-Cheks.  Latest POC glucose of 108.  Acute kidney injury on CKD stage IIIb-at baseline at this time.  Latest creatinine of  1.1  Metabolic encephalopathy-likely from volume depletion.   Overall improved.  MRI brain showed no acute abnormality.   UA was negative.  Urine culture showed Enterobacter.  No urinary symptoms reported by the patient.  Will not treat asymptomatic bacteriuria.  Generalized weakness, debility -secondary to CVA,  PT recommended skilled nursing facility.  Continue statin and Eliquis.  DVT prophylaxis:  apixaban (ELIQUIS) tablet 5 mg    Code Status: Full code  Family Communication:  None today  Status is: Inpatient  Remains inpatient appropriate because: Unsafe  disposition, need for  placement.  Dispo: The patient is from: Home              Anticipated d/c is to: SNF              Anticipated d/c date is: 1 day or when bed is available.  Transition of care on board.               Patient currently is  medically stable to d/c   Consultants:  none  Procedures:  none  Anti-infectives:  . none  Anti-infectives (From admission, onward)   None      Subjective: Today, patient was seen and examined at bedside.   Patient denies any nausea vomiting abdominal pain.  Has generalized weakness.  Denies shortness of breath cough fever   Objective: Vitals:   01/30/20 2201 01/31/20 0358  BP: 139/90 (!) 169/93  Pulse: 66 (!) 53  Resp: 18 17  Temp: 99.2 F (37.3 C) 97.8 F (36.6 C)  SpO2: 99% 100%    Intake/Output Summary (Last 24 hours) at 01/31/2020 0748 Last data filed at 01/31/2020 0401 Gross  per 24 hour  Intake 1458.12 ml  Output 950 ml  Net 508.12 ml   Filed Weights   01/28/20 0500 01/29/20 0534 01/30/20 0500  Weight: 70.1 kg 71.9 kg 71.4 kg   Body mass index is 20.21 kg/m.   Physical Exam:  General: Alert awake, not in obvious distress, communicative HENT:   No scleral pallor or icterus noted. Oral mucosa is moist.  Left eye blindness with opacity. Chest:   Diminished breath sounds bilaterally. No crackles or wheezes.  CVS: S1 &S2 heard. No murmur.  Regular  rate and rhythm. Abdomen: Soft, nontender, nondistended.  Bowel sounds are heard.   Extremities: No cyanosis, clubbing or edema.  Moving all extremities. Psych: Alert, awake and oriented, normal mood CNS:  No cranial nerve deficits.  Generalized weakness noted. Skin: Warm and dry.  No rashes noted.  Data Review: I have personally reviewed the following laboratory data and studies,  CBC: Recent Labs  Lab 01/25/20 0221 01/28/20 0249 01/29/20 0209 01/30/20 0152 01/31/20 0349  WBC 4.2 4.2 3.2* 3.3* 3.5*  NEUTROABS 2.2  --   --   --   --   HGB 14.0 13.8 13.1 12.3* 11.5*  HCT 43.8 43.4 40.7 38.4* 36.0*  MCV 91.8 89.1 88.3 88.9 89.6  PLT 95* 80* 73* 66* 65*   Basic Metabolic Panel: Recent Labs  Lab 01/25/20 0221 01/25/20 1136 01/27/20 0155 01/28/20 0249 01/29/20 0209 01/30/20 0152 01/31/20 0349  NA 149*   < > 146* 139 136 137 140  K 3.9  --  3.4* 3.2* 3.1* 4.0 3.9  CL 111  --  110 105 102 106 107  CO2 27  --  27 24 24 25 27   GLUCOSE 136*  --  127* 130* 124* 129* 124*  BUN 67*  --  32* 19 19 21* 15  CREATININE 2.12*  --  1.53* 1.19 1.24 1.26* 1.14  CALCIUM 9.4  --  8.9 9.0 8.6* 8.3* 8.3*  MG 2.5*  --   --   --  1.5* 1.9  --   PHOS  --   --   --   --   --  2.5  --    < > = values in this interval not displayed.   Liver Function Tests: Recent Labs  Lab 01/30/20 0152  AST 16  ALT 10  ALKPHOS 51  BILITOT 0.9  PROT 5.5*  ALBUMIN 2.9*   No results for input(s): LIPASE, AMYLASE in the last 168 hours. No results for input(s): AMMONIA in the last 168 hours. Cardiac Enzymes: No results for input(s): CKTOTAL, CKMB, CKMBINDEX, TROPONINI in the last 168 hours. BNP (last 3 results) Recent Labs    11/29/19 0037 01/04/20 1339 01/07/20 1549  BNP 1,278.7* 465.9* 46.1    ProBNP (last 3 results) No results for input(s): PROBNP in the last 8760 hours.  CBG: Recent Labs  Lab 01/30/20 0830 01/30/20 1249 01/30/20 1311 01/30/20 1631 01/30/20 2203  GLUCAP 114* 187* 133*  156* 197*   Recent Results (from the past 240 hour(s))  SARS CORONAVIRUS 2 (TAT 6-24 HRS) Nasopharyngeal Nasopharyngeal Swab     Status: None   Collection Time: 01/23/20  9:47 PM   Specimen: Nasopharyngeal Swab  Result Value Ref Range Status   SARS Coronavirus 2 NEGATIVE NEGATIVE Final    Comment: (NOTE) SARS-CoV-2 target nucleic acids are NOT DETECTED.  The SARS-CoV-2 RNA is generally detectable in upper and lower respiratory specimens during the acute phase of infection. Negative results do not  preclude SARS-CoV-2 infection, do not rule out co-infections with other pathogens, and should not be used as the sole basis for treatment or other patient management decisions. Negative results must be combined with clinical observations, patient history, and epidemiological information. The expected result is Negative.  Fact Sheet for Patients: HairSlick.no  Fact Sheet for Healthcare Providers: quierodirigir.com  This test is not yet approved or cleared by the [place] FDA and  has been authorized for detection and/or diagnosis of SARS-CoV-2 by FDA under an Emergency Use Authorization (EUA). This EUA will remain  in effect (meaning this test can be used) for the duration of the COVID-19 declaration under Se ction 564(b)(1) of the Act, 21 U.S.C. section 360bbb-3(b)(1), unless the authorization is terminated or revoked sooner.  Performed at [facility], [address], [place], [place] [address]   Culture, Urine     Status: Abnormal   Collection Time: 01/28/20  6:32 AM   Specimen: Urine, Clean Catch  Result Value Ref Range Status   Specimen Description URINE, CLEAN CATCH  Final   Special Requests   Final    NONE Performed at [facility], [address] [place], [place] [address]    Culture >=100,000 COLONIES/mL ENTEROBACTER CLOACAE (A)  Final   Report Status 01/30/2020 FINAL  Final   Organism ID, Bacteria  ENTEROBACTER CLOACAE (A)  Final      Susceptibility   Enterobacter cloacae - MIC*    CEFAZOLIN >=64 RESISTANT Resistant     CEFEPIME <=0.12 SENSITIVE Sensitive     CIPROFLOXACIN <=0.25 SENSITIVE Sensitive     GENTAMICIN <=1 SENSITIVE Sensitive     IMIPENEM 4 SENSITIVE Sensitive     NITROFURANTOIN 64 INTERMEDIATE Intermediate     TRIMETH/SULFA <=20 SENSITIVE Sensitive     PIP/TAZO <=4 SENSITIVE Sensitive     * >=100,000 COLONIES/mL ENTEROBACTER CLOACAE     Studies: No results found.    Joycelyn Das, MD  [facility] 01/31/2020  If 7PM-7AM, please contact night-coverage

## 2020-01-31 NOTE — Care Management (Addendum)
Called Kevin [name] [phone] , Adult Medicaid case worker again and left another  message. To confirm patient parents have applied for medicaid and disability.   [employer] [phone] could get through . On hold greater than 30 minutes and get disconnected x 2   Called patient's mother Kevin [name]. Patient has applied for disability in the past but was declined. She will call  [name] [phone] , Adult Medicaid and social security to start application.   Ronny Flurry RN

## 2020-02-01 LAB — CBC
HCT: 33.8 % — ABNORMAL LOW (ref 39.0–52.0)
Hemoglobin: 11.5 g/dL — ABNORMAL LOW (ref 13.0–17.0)
MCH: 29.5 pg (ref 26.0–34.0)
MCHC: 34 g/dL (ref 30.0–36.0)
MCV: 86.7 fL (ref 80.0–100.0)
Platelets: 66 10*3/uL — ABNORMAL LOW (ref 150–400)
RBC: 3.9 MIL/uL — ABNORMAL LOW (ref 4.22–5.81)
RDW: 12.1 % (ref 11.5–15.5)
WBC: 3.8 10*3/uL — ABNORMAL LOW (ref 4.0–10.5)
nRBC: 0 % (ref 0.0–0.2)

## 2020-02-01 LAB — MAGNESIUM: Magnesium: 1.7 mg/dL (ref 1.7–2.4)

## 2020-02-01 LAB — GLUCOSE, CAPILLARY
Glucose-Capillary: 95 mg/dL (ref 70–99)
Glucose-Capillary: 142 mg/dL — ABNORMAL HIGH (ref 70–99)
Glucose-Capillary: 232 mg/dL — ABNORMAL HIGH (ref 70–99)
Glucose-Capillary: 120 mg/dL — ABNORMAL HIGH (ref 70–99)

## 2020-02-01 LAB — BASIC METABOLIC PANEL
Anion gap: 8 (ref 5–15)
BUN: 17 mg/dL (ref 6–20)
CO2: 27 mmol/L (ref 22–32)
Calcium: 8.7 mg/dL — ABNORMAL LOW (ref 8.9–10.3)
Chloride: 104 mmol/L (ref 98–111)
Creatinine, Ser: 1.15 mg/dL (ref 0.61–1.24)
GFR, Estimated: >60 mL/min (ref >60)
Glucose, Bld: 119 mg/dL — ABNORMAL HIGH (ref 70–99)
Potassium: 4.1 mmol/L (ref 3.5–5.1)
Sodium: 139 mmol/L (ref 135–145)

## 2020-02-01 LAB — PHOSPHORUS: Phosphorus: 2.4 mg/dL — ABNORMAL LOW (ref 2.5–4.6)

## 2020-02-01 NOTE — Progress Notes (Signed)
Occupational Therapy Treatment Patient Details Name: Kevin [name] MRN: [id number] DOB: [birth year] Today's Date: 02/01/2020    History of present illness Pt is a 55 y/o male admitted secondary to weakness, generalized decline at home, and hyponatremia. PMH includes L eye blindness, DM, CVA, HTN, CHF, and CKD.   OT comments  Pt making progress with functional goals. Upon arrival pt in bed leaning to L side ad not initiation eating his lunch. Assisted pt to EOB min A, sit - stand with RW min A to ambulate to recliner. Pt continues to present with cognitive impairments, requiring multimodal cues for initiating functional tasks for ADLs/selfcare and functional mobility. Pt only shaking head yes or no when asked questions this session. OT will continue to follow acutely to maximize level of function and safety  Follow Up Recommendations  Supervision/Assistance - 24 hour;SNF    Equipment Recommendations  Other (comment) (TBD at next venue of care)    Recommendations for Other Services      Precautions / Restrictions Precautions Precautions: Fall Restrictions Weight Bearing Restrictions: No       Mobility Bed Mobility Overal bed mobility: Needs Assistance Bed Mobility: Supine to Sit     Supine to sit: HOB elevated;Min assist     General bed mobility comments: min A with mulitmodal cues to initiate LEs to EOB/off EOB and to elevate trunk  Transfers Overall transfer level: Needs assistance Equipment used: Rolling walker (2 wheeled) Transfers: Sit to/from Stand Sit to Stand: Min assist         General transfer comment: multimodal cues for hand placement    Balance Overall balance assessment: Needs assistance Sitting-balance support: No upper extremity supported;Feet supported Sitting balance-Leahy Scale: Fair     Standing balance support: Bilateral upper extremity supported;During functional activity Standing balance-Leahy Scale: Poor                              ADL either performed or assessed with clinical judgement   ADL Overall ADL's : Needs assistance/impaired Eating/Feeding: Set up;Sitting;Supervision/ safety   Grooming: Standing;Wash/dry hands;Min guard;Wash/dry face;Cueing for safety Grooming Details (indicate cue type and reason): multimodal cues to initiate         Upper Body Dressing : Min guard;Sitting Upper Body Dressing Details (indicate cue type and reason): multimodal cues to initiate     Toilet Transfer: RW;Ambulation;Min guard;Cueing for safety;Cueing for sequencing   Toileting- Clothing Manipulation and Hygiene: Sit to/from stand;Moderate assistance       Functional mobility during ADLs: Minimal assistance;Rolling walker;Cueing for safety;Cueing for sequencing General ADL Comments: pt very slow to process with cognition being a very limiting factor to ADL engagement     Vision Patient Visual Report: No change from baseline     Perception     Praxis      Cognition Arousal/Alertness: Awake/alert Behavior During Therapy: Flat affect Overall Cognitive Status: No family/caregiver present to determine baseline cognitive functioning Area of Impairment: Awareness;Following commands;Attention;Problem solving                     Memory: Decreased short-term memory Following Commands: Follows one step commands inconsistently;Follows one step commands with increased time Safety/Judgement: Decreased awareness of safety;Decreased awareness of deficits   Problem Solving: Slow processing;Requires verbal cues;Requires tactile cues;Decreased initiation;Difficulty sequencing General Comments: pt only shaking head yes or no when asled questions, slow to respond to 1 step commnads, required increased time and multimodal cues to initiate  functional tasks        Exercises     Shoulder Instructions       General Comments      Pertinent Vitals/ Pain       Pain Assessment: No/denies pain Pain Score: 0-No  pain Pain Intervention(s): Monitored during session  Home Living                                          Prior Functioning/Environment              Frequency  Min 2X/week        Progress Toward Goals  OT Goals(current goals can now be found in the care plan section)  Progress towards OT goals: Progressing toward goals  Acute Rehab OT Goals Patient Stated Goal: not stated  Plan Discharge plan remains appropriate    Co-evaluation                 AM-PAC OT "6 Clicks" Daily Activity     Outcome Measure   Help from another person eating meals?: A Little Help from another person taking care of personal grooming?: A Little Help from another person toileting, which includes using toliet, bedpan, or urinal?: A Lot Help from another person bathing (including washing, rinsing, drying)?: A Lot Help from another person to put on and taking off regular upper body clothing?: A Little Help from another person to put on and taking off regular lower body clothing?: Total 6 Click Score: 14    End of Session Equipment Utilized During Treatment: Gait belt;Rolling walker  OT Visit Diagnosis: Unsteadiness on feet (R26.81);Cognitive communication deficit (R41.841);Low vision, both eyes (H54.2);Other symptoms and signs involving cognitive function;Muscle weakness (generalized) (M62.81);Other abnormalities of gait and mobility (R26.89)   Activity Tolerance Patient tolerated treatment well   Patient Left in chair;with call bell/phone within reach;with chair alarm set   Nurse Communication Mobility status        Time: 5784-6962 OT Time Calculation (min): 17 min  Charges: OT General Charges $OT Visit: 1 Visit OT Treatments $Self Care/Home Management : 8-22 mins     Galen Manila 02/01/2020, 2:54 PM

## 2020-02-01 NOTE — Progress Notes (Signed)
PROGRESS NOTE  [name] WVP:[id number] DOB: Jan 21, 1965 DOA: 01/23/2020 PCP: Medicine, [facility]   LOS: 8 days   Brief narrative: 55 year old male with medical history of systolic and diastolic heart failure, CKD, CVA, diabetes mellitus type 2, hypertension, left eye blindness presented to the hospital after generalized weakness since December at home.  He was supposed to follow-up with home health at home but due to lack of insurance/acceptance of cash he was unable to get any help.  Patient also complained of decreased oral intake and cognitive deficits since December.  Patient was then admitted to the hospital for further evaluation and treatment.  Assessment/Plan:  Principal Problem:   Hypernatremia Active Problems:   Diabetes mellitus without complication (HCC)   History of CVA (cerebrovascular accident)   Essential hypertension   CKD (chronic kidney disease), stage III (HCC)   Chronic CHF (congestive heart failure) (HCC)   Acute hypernatremia  Hypovolemic hypernatremia-resolved with IV fluids.  Latest sodium of 140. initial sodium of 152 likely secondary to poor oral intake and dysphagia.     Dysphagia-improved.  Currently on regular diet  Hypokalemia-resolved at this time.  Hypomagnesemia.  Improved.  Anemia 1.9 but  Essential hypertension-continue amlodipine, lisinopril.  Consider discontinuation of home HCTZ on discharge  Diabetes mellitus type 2- patient is on metformin at home.  We will continue sliding scale insulin, Accu-Cheks.  Latest POC glucose of 95.  Acute kidney injury on CKD stage IIIb-at baseline at this time.  Latest creatinine of 1.1  Metabolic encephalopathy-likely from volume depletion.   Resolved.  MRI brain showed no acute abnormality.   UA was negative.  Urine culture showed Enterobacter.  No urinary symptoms reported by the patient.  Will not treat asymptomatic bacteriuria.  Generalized weakness, debility -secondary to CVA,  medical comorbidities. PT recommended skilled nursing facility.  Continue statin and Eliquis.  Pending skilled nursing facility placement  DVT prophylaxis:  apixaban (ELIQUIS) tablet 5 mg    Code Status: Full code  Family Communication:  None today  Status is: Inpatient  Remains inpatient appropriate because: Unsafe  disposition, need for  placement.  Dispo: The patient is from: Home              Anticipated d/c is to: SNF              Anticipated d/c date is: 1 day or when bed is available.  Transition of care on board.               Patient currently is  medically stable to d/c   Consultants:  none  Procedures:  none  Anti-infectives:   none  Anti-infectives (From admission, onward)   None      Subjective: Today, patient was seen and examined at bedside. patient denies interval complaints.  Denies any nausea vomiting fever chills or rigor.  Has had generalized weakness.  Objective: Vitals:   01/31/20 1949 02/01/20 0457  BP: (!) 156/88 (!) 163/100  Pulse: 71 73  Resp: 18 18  Temp: 97.9 F (36.6 C) 98.2 F (36.8 C)  SpO2: 100% 99%    Intake/Output Summary (Last 24 hours) at 02/01/2020 0810 Last data filed at 02/01/2020 0459 Gross per 24 hour  Intake 480 ml  Output 1350 ml  Net -870 ml   Filed Weights   01/28/20 0500 01/29/20 0534 01/30/20 0500  Weight: 70.1 kg 71.9 kg 71.4 kg   Body mass index is 20.21 kg/m.   Physical Exam:  General: Alert  awake, not in obvious distress, communicative HENT:   No scleral pallor or icterus noted. Oral mucosa is moist.   Chest:   Diminished breath sounds bilaterally. No crackles or wheezes.  CVS: S1 &S2 heard. No murmur.  Regular rate and rhythm. Abdomen: Soft, nontender, nondistended.  Bowel sounds are heard.   Extremities: No cyanosis, clubbing or edema.  Moving all extremities. Psych: Alert, awake and oriented, normal mood CNS:  No cranial nerve deficits.  Generalized weakness noted. Skin: Warm and dry.  No  rashes noted.  Data Review: I have personally reviewed the following laboratory data and studies,  CBC: Recent Labs  Lab 01/28/20 0249 01/29/20 0209 01/30/20 0152 01/31/20 0349 02/01/20 0146  WBC 4.2 3.2* 3.3* 3.5* 3.8*  HGB 13.8 13.1 12.3* 11.5* 11.5*  HCT 43.4 40.7 38.4* 36.0* 33.8*  MCV 89.1 88.3 88.9 89.6 86.7  PLT 80* 73* 66* 65* 66*   Basic Metabolic Panel: Recent Labs  Lab 01/27/20 0155 01/28/20 0249 01/29/20 0209 01/30/20 0152 01/31/20 0349  NA 146* 139 136 137 140  K 3.4* 3.2* 3.1* 4.0 3.9  CL 110 105 102 106 107  CO2 27 24 24 25 27   GLUCOSE 127* 130* 124* 129* 124*  BUN 32* 19 19 21* 15  CREATININE 1.53* 1.19 1.24 1.26* 1.14  CALCIUM 8.9 9.0 8.6* 8.3* 8.3*  MG  --   --  1.5* 1.9  --   PHOS  --   --   --  2.5  --    Liver Function Tests: Recent Labs  Lab 01/30/20 0152  AST 16  ALT 10  ALKPHOS 51  BILITOT 0.9  PROT 5.5*  ALBUMIN 2.9*   No results for input(s): LIPASE, AMYLASE in the last 168 hours. No results for input(s): AMMONIA in the last 168 hours. Cardiac Enzymes: No results for input(s): CKTOTAL, CKMB, CKMBINDEX, TROPONINI in the last 168 hours. BNP (last 3 results) Recent Labs    11/29/19 0037 01/04/20 1339 01/07/20 1549  BNP 1,278.7* 465.9* 46.1    ProBNP (last 3 results) No results for input(s): PROBNP in the last 8760 hours.  CBG: Recent Labs  Lab 01/31/20 0752 01/31/20 1142 01/31/20 1628 01/31/20 2059 02/01/20 0755  GLUCAP 108* 126* 148* 210* 95   Recent Results (from the past 240 hour(s))  SARS CORONAVIRUS 2 (TAT 6-24 HRS) Nasopharyngeal Nasopharyngeal Swab     Status: None   Collection Time: 01/23/20  9:47 PM   Specimen: Nasopharyngeal Swab  Result Value Ref Range Status   SARS Coronavirus 2 NEGATIVE NEGATIVE Final    Comment: (NOTE) SARS-CoV-2 target nucleic acids are NOT DETECTED.  The SARS-CoV-2 RNA is generally detectable in upper and lower respiratory specimens during the acute phase of infection.  Negative results do not preclude SARS-CoV-2 infection, do not rule out co-infections with other pathogens, and should not be used as the sole basis for treatment or other patient management decisions. Negative results must be combined with clinical observations, patient history, and epidemiological information. The expected result is Negative.  Fact Sheet for Patients: 03/22/20  Fact Sheet for Healthcare Providers: HairSlick.no  This test is not yet approved or cleared by the [place] FDA and  has been authorized for detection and/or diagnosis of SARS-CoV-2 by FDA under an Emergency Use Authorization (EUA). This EUA will remain  in effect (meaning this test can be used) for the duration of the COVID-19 declaration under Se ction 564(b)(1) of the Act, 21 U.S.C. section 360bbb-3(b)(1), unless the authorization is terminated  or revoked sooner.  Performed at [facility], [address], [place], [place] [address]   Culture, Urine     Status: Abnormal   Collection Time: 01/28/20  6:32 AM   Specimen: Urine, Clean Catch  Result Value Ref Range Status   Specimen Description URINE, CLEAN CATCH  Final   Special Requests   Final    NONE Performed at [facility], [address], [place], [place] [address]    Culture >=100,000 COLONIES/mL ENTEROBACTER CLOACAE (A)  Final   Report Status 01/30/2020 FINAL  Final   Organism ID, Bacteria ENTEROBACTER CLOACAE (A)  Final      Susceptibility   Enterobacter cloacae - MIC*    CEFAZOLIN >=64 RESISTANT Resistant     CEFEPIME <=0.12 SENSITIVE Sensitive     CIPROFLOXACIN <=0.25 SENSITIVE Sensitive     GENTAMICIN <=1 SENSITIVE Sensitive     IMIPENEM 4 SENSITIVE Sensitive     NITROFURANTOIN 64 INTERMEDIATE Intermediate     TRIMETH/SULFA <=20 SENSITIVE Sensitive     PIP/TAZO <=4 SENSITIVE Sensitive     * >=100,000 COLONIES/mL ENTEROBACTER CLOACAE     Studies: No  results found.    Joycelyn Das, MD  [facility] 02/01/2020  If 7PM-7AM, please contact night-coverage

## 2020-02-01 NOTE — Care Management (Signed)
[name] [phone] , Adult Medicaid case workerhas not returned calls.   Was instructed to call [facility] [phone] to assist with disability . Called left Ms Mayford Knife a message with my contact information.    Ronny Flurry RN

## 2020-02-02 LAB — GLUCOSE, CAPILLARY
Glucose-Capillary: 82 mg/dL (ref 70–99)
Glucose-Capillary: 117 mg/dL — ABNORMAL HIGH (ref 70–99)
Glucose-Capillary: 106 mg/dL — ABNORMAL HIGH (ref 70–99)
Glucose-Capillary: 225 mg/dL — ABNORMAL HIGH (ref 70–99)

## 2020-02-02 NOTE — Progress Notes (Signed)
PROGRESS NOTE  Kevin [name] [facility] [id number]:[id number] DOB: 04-13-1965 DOA: 01/23/2020 PCP: Medicine, [facility]  HPI/Recap of past 77 hours: 55 year old male with medical history of systolic and diastolic heart failure, CKD, CVA, diabetes mellitus type 2, hypertension, left eye blindness presented to the hospital after generalized weakness since December at home.  He was supposed to follow-up with home health at home but due to lack of insurance/acceptance of cash he was unable to get any help.  Patient also complained of decreased oral intake and cognitive deficits since December.  Patient was then admitted to the hospital for further evaluation and treatment.  Patient is waiting for SNF  Subjective: February 02, 2020: Patient patient seen and examined at bedside denies any new complain  Assessment/Plan: Principal Problem:   Hypernatremia Active Problems:   Diabetes mellitus without complication (HCC)   History of CVA (cerebrovascular accident)   Essential hypertension   CKD (chronic kidney disease), stage III (HCC)   Chronic CHF (congestive heart failure) (HCC)   Acute hypernatremia  #1 hyponatremia is resolved continue fluids and monitor  2. Dysphagia: Patient has been evaluated by speech therapy she has a mild aspiration risk and so is on dysphagia therapy  3. Acute kidney injury on chronic kidney disease stage IIIb is currently at baseline his creatinine today is 1. 15  4. Metabolic encephalopathy: He has improved overall his speech is still slightly slow MRI of the brain showed no acute abnormality  5. Generalized weakness/debility secondary to CVA physical therapy is working on him and they are recommending SNF transfer. He will continue statin and Eliquis. We will waiting for SNF transfer   Code Status: Full  Severity of Illness: The appropriate patient status for this patient is INPATIENT. Inpatient status is judged to be reasonable and necessary in order to  provide the required intensity of service to ensure the patient's safety. The patient's presenting symptoms, physical exam findings, and initial radiographic and laboratory data in the context of their chronic comorbidities is felt to place them at high risk for further clinical deterioration. Furthermore, it is not anticipated that the patient will be medically stable for discharge from the hospital within 2 midnights of admission. The following factors support the patient status of inpatient.   " The patient's presenting symptoms include stroke. Stroke " The worrisome physical exam findings include stroke " The initial radiographic and laboratory data are worrisome because of stroke " The chronic co-morbidities include hyponatremia dysphagia metabolic encephalopathy generalized weakness.   * I certify that at the point of admission it is my clinical judgment that the patient will require inpatient hospital care spanning beyond 2 midnights from the point of admission due to high intensity of service, high risk for further deterioration and high frequency of surveillance required.*    Family Communication: Patient  Disposition Plan: SNF   Consultants:  None  Procedures:  None  Antimicrobials:  None  DVT prophylaxis: Apixaban   Objective: Vitals:   02/01/20 0928 02/01/20 1554 02/01/20 2123 02/02/20 0439  BP: (!) 168/84 129/79 (!) 143/66 (!) 142/64  Pulse:  71 73 72  Resp:  16 17 17   Temp:  98.4 F (36.9 C) 97.7 F (36.5 C) 98 F (36.7 C)  TempSrc:  Oral Oral Oral  SpO2:  100% 100% 100%  Weight:        Intake/Output Summary (Last 24 hours) at 02/02/2020 0934 Last data filed at 02/01/2020 1554 Gross per 24 hour  Intake -  Output  400 ml  Net -400 ml   Filed Weights   01/28/20 0500 01/29/20 0534 01/30/20 0500  Weight: 70.1 kg 71.9 kg 71.4 kg   Body mass index is 20.21 kg/m.  Exam:  . General: 55 y.o. year-old male well developed well nourished in no acute  distress.  Alert and oriented x3. . Cardiovascular: Regular rate and rhythm with no rubs or gallops.  No thyromegaly or JVD noted.   Marland Kitchen Respiratory: Clear to auscultation with no wheezes or rales. Good inspiratory effort. . Abdomen: Soft nontender nondistended with normal bowel sounds x4 quadrants. . Musculoskeletal: No lower extremity edema. 2/4 pulses in all 4 extremities. . Skin: No ulcerative lesions noted or rashes, . Psychiatry: Mood is appropriate for condition and setting speech is slightly improved. And slowed    Data Reviewed: CBC: Recent Labs  Lab 01/28/20 0249 01/29/20 0209 01/30/20 0152 01/31/20 0349 02/01/20 0146  WBC 4.2 3.2* 3.3* 3.5* 3.8*  HGB 13.8 13.1 12.3* 11.5* 11.5*  HCT 43.4 40.7 38.4* 36.0* 33.8*  MCV 89.1 88.3 88.9 89.6 86.7  PLT 80* 73* 66* 65* 66*   Basic Metabolic Panel: Recent Labs  Lab 01/28/20 0249 01/29/20 0209 01/30/20 0152 01/31/20 0349 02/01/20 0922  NA 139 136 137 140 139  K 3.2* 3.1* 4.0 3.9 4.1  CL 105 102 106 107 104  CO2 24 24 25 27 27   GLUCOSE 130* 124* 129* 124* 119*  BUN 19 19 21* 15 17  CREATININE 1.19 1.24 1.26* 1.14 1.15  CALCIUM 9.0 8.6* 8.3* 8.3* 8.7*  MG  --  1.5* 1.9  --  1.7  PHOS  --   --  2.5  --  2.4*   GFR: Estimated Creatinine Clearance: 73.3 mL/min (by C-G formula based on SCr of 1.15 mg/dL). Liver Function Tests: Recent Labs  Lab 01/30/20 0152  AST 16  ALT 10  ALKPHOS 51  BILITOT 0.9  PROT 5.5*  ALBUMIN 2.9*   No results for input(s): LIPASE, AMYLASE in the last 168 hours. No results for input(s): AMMONIA in the last 168 hours. Coagulation Profile: No results for input(s): INR, PROTIME in the last 168 hours. Cardiac Enzymes: No results for input(s): CKTOTAL, CKMB, CKMBINDEX, TROPONINI in the last 168 hours. BNP (last 3 results) No results for input(s): PROBNP in the last 8760 hours. HbA1C: No results for input(s): HGBA1C in the last 72 hours. CBG: Recent Labs  Lab 02/01/20 0755  02/01/20 1159 02/01/20 1728 02/01/20 2125 02/02/20 0753  GLUCAP 95 142* 232* 120* 82   Lipid Profile: No results for input(s): CHOL, HDL, LDLCALC, TRIG, CHOLHDL, LDLDIRECT in the last 72 hours. Thyroid Function Tests: No results for input(s): TSH, T4TOTAL, FREET4, T3FREE, THYROIDAB in the last 72 hours. Anemia Panel: No results for input(s): VITAMINB12, FOLATE, FERRITIN, TIBC, IRON, RETICCTPCT in the last 72 hours. Urine analysis:    Component Value Date/Time   COLORURINE YELLOW 01/28/2020 0727   APPEARANCEUR HAZY (A) 01/28/2020 0727   LABSPEC 1.015 01/28/2020 0727   PHURINE 6.0 01/28/2020 0727   GLUCOSEU 50 (A) 01/28/2020 0727   HGBUR SMALL (A) 01/28/2020 0727   BILIRUBINUR NEGATIVE 01/28/2020 0727   KETONESUR 5 (A) 01/28/2020 0727   PROTEINUR NEGATIVE 01/28/2020 0727   NITRITE NEGATIVE 01/28/2020 0727   LEUKOCYTESUR NEGATIVE 01/28/2020 0727   Sepsis Labs: @LABRCNTIP (procalcitonin:4,lacticidven:4)  ) Recent Results (from the past 240 hour(s))  SARS CORONAVIRUS 2 (TAT 6-24 HRS) Nasopharyngeal Nasopharyngeal Swab     Status: None   Collection Time: 01/23/20  9:47  PM   Specimen: Nasopharyngeal Swab  Result Value Ref Range Status   SARS Coronavirus 2 NEGATIVE NEGATIVE Final    Comment: (NOTE) SARS-CoV-2 target nucleic acids are NOT DETECTED.  The SARS-CoV-2 RNA is generally detectable in upper and lower respiratory specimens during the acute phase of infection. Negative results do not preclude SARS-CoV-2 infection, do not rule out co-infections with other pathogens, and should not be used as the sole basis for treatment or other patient management decisions. Negative results must be combined with clinical observations, patient history, and epidemiological information. The expected result is Negative.  Fact Sheet for Patients: HairSlick.no  Fact Sheet for Healthcare Providers: quierodirigir.com  This test is not  yet approved or cleared by the [place] FDA and  has been authorized for detection and/or diagnosis of SARS-CoV-2 by FDA under an Emergency Use Authorization (EUA). This EUA will remain  in effect (meaning this test can be used) for the duration of the COVID-19 declaration under Se ction 564(b)(1) of the Act, 21 U.S.C. section 360bbb-3(b)(1), unless the authorization is terminated or revoked sooner.  Performed at [facility], [address], [place], [place] [address]   Culture, Urine     Status: Abnormal   Collection Time: 01/28/20  6:32 AM   Specimen: Urine, Clean Catch  Result Value Ref Range Status   Specimen Description URINE, CLEAN CATCH  Final   Special Requests   Final    NONE Performed at [facility], [address], [place], [place] [address]    Culture >=100,000 COLONIES/mL ENTEROBACTER CLOACAE (A)  Final   Report Status 01/30/2020 FINAL  Final   Organism ID, Bacteria ENTEROBACTER CLOACAE (A)  Final      Susceptibility   Enterobacter cloacae - MIC*    CEFAZOLIN >=64 RESISTANT Resistant     CEFEPIME <=0.12 SENSITIVE Sensitive     CIPROFLOXACIN <=0.25 SENSITIVE Sensitive     GENTAMICIN <=1 SENSITIVE Sensitive     IMIPENEM 4 SENSITIVE Sensitive     NITROFURANTOIN 64 INTERMEDIATE Intermediate     TRIMETH/SULFA <=20 SENSITIVE Sensitive     PIP/TAZO <=4 SENSITIVE Sensitive     * >=100,000 COLONIES/mL ENTEROBACTER CLOACAE      Studies: No results found.  Scheduled Meds: . amLODipine  10 mg Oral Daily  . apixaban  5 mg Oral BID  . atorvastatin  80 mg Oral Daily  . insulin aspart  0-9 Units Subcutaneous TID WC  . lisinopril  20 mg Oral Daily  . magnesium oxide  400 mg Oral BID  . sodium chloride flush  3 mL Intravenous Q12H    Continuous Infusions:   LOS: 9 days     Myrtie Neither, MD [facility]  To reach me or the doctor on call, go to: [email or web address] Password [facility]  02/02/2020, 9:34 AM

## 2020-02-03 LAB — GLUCOSE, CAPILLARY
Glucose-Capillary: 201 mg/dL — ABNORMAL HIGH (ref 70–99)
Glucose-Capillary: 66 mg/dL — ABNORMAL LOW (ref 70–99)
Glucose-Capillary: 160 mg/dL — ABNORMAL HIGH (ref 70–99)
Glucose-Capillary: 123 mg/dL — ABNORMAL HIGH (ref 70–99)
Glucose-Capillary: 198 mg/dL — ABNORMAL HIGH (ref 70–99)

## 2020-02-03 NOTE — Progress Notes (Signed)
PROGRESS NOTE  Kevin [name] [facility] YWV:[id number] DOB: December 28, 1965 DOA: 01/23/2020 PCP: [facility], [facility]  HPI/Recap of past 13 hours: 55 year old male with medical history of systolic and diastolic heart failure, CKD, CVA, diabetes mellitus type 2, hypertension, left eye blindness presented to the hospital after generalized weakness since December at home.  He was supposed to follow-up with home health at home but due to lack of insurance/acceptance of cash he was unable to get any help.  Patient also complained of decreased oral intake and cognitive deficits since December.  Patient was then admitted to the hospital for further evaluation and treatment.  Patient is waiting for SNF  Subjective: February 03, 2020: Patient seen and examined at bedside his mother [name] at bedside.  Nurse had reported that patient got dressed and wanted to go home he did not quite understand what was going on.  He called his mother who is at bedside now February 02, 2020: Patient patient seen and examined at bedside denies any new complain  Assessment/Plan: Principal Problem:   Hypernatremia Active Problems:   Diabetes mellitus without complication (HCC)   History of CVA (cerebrovascular accident)   Essential hypertension   CKD (chronic kidney disease), stage III (HCC)   Chronic CHF (congestive heart failure) (HCC)   Acute hypernatremia  #1 hyponatremia is resolved continue fluids and monitor  2. Dysphagia: Patient has been evaluated by speech therapy she has a mild aspiration risk and so is on dysphagia therapy  3. Acute kidney injury on chronic kidney disease stage IIIb is currently at baseline his creatinine today is 1. 15  4. Metabolic encephalopathy: He has improved overall his speech is still slightly slow MRI of the brain showed no acute abnormality  5. Generalized weakness/debility secondary to CVA physical therapy is working on him and they are recommending SNF transfer. He will  continue statin and Eliquis. We will waiting for SNF transfer   Code Status: Full  Severity of Illness: The appropriate patient status for this patient is INPATIENT. Inpatient status is judged to be reasonable and necessary in order to provide the required intensity of service to ensure the patient's safety. The patient's presenting symptoms, physical exam findings, and initial radiographic and laboratory data in the context of their chronic comorbidities is felt to place them at high risk for further clinical deterioration. Furthermore, it is not anticipated that the patient will be medically stable for discharge from the hospital within 2 midnights of admission. The following factors support the patient status of inpatient.   " The patient's presenting symptoms include stroke. Stroke " The worrisome physical exam findings include stroke " The initial radiographic and laboratory data are worrisome because of stroke " The chronic co-morbidities include hyponatremia dysphagia metabolic encephalopathy generalized weakness.   * I certify that at the point of admission it is my clinical judgment that the patient will require inpatient hospital care spanning beyond 2 midnights from the point of admission due to high intensity of service, high risk for further deterioration and high frequency of surveillance required.*    Family Communication: Mother [name] at bedside  Disposition Plan: SNF   Consultants:  None  Procedures:  None  Antimicrobials:  None  DVT prophylaxis: Apixaban   Objective: Vitals:   02/02/20 1957 02/02/20 1959 02/02/20 2200 02/03/20 0535  BP: (!) 160/93 (!) 161/94 (!) 147/85 (!) 165/95  Pulse: 76 76 74 62  Resp: 16 16  18   Temp: 98.5 F (36.9 C) 98.5 F (36.9  C)  98.4 F (36.9 C)  TempSrc: Oral Oral  Oral  SpO2: 100% 100%  100%  Weight:        Intake/Output Summary (Last 24 hours) at 02/03/2020 1028 Last data filed at 02/03/2020 0400 Gross per 24 hour   Intake 605 ml  Output -  Net 605 ml   Filed Weights   01/28/20 0500 01/29/20 0534 01/30/20 0500  Weight: 70.1 kg 71.9 kg 71.4 kg   Body mass index is 20.21 kg/m.  Exam:  . General: 55 y.o. year-old male well developed well nourished in no acute distress.  Alert and oriented x3. . Cardiovascular: Regular rate and rhythm with no rubs or gallops.  No thyromegaly or JVD noted.   Marland Kitchen Respiratory: Clear to auscultation with no wheezes or rales. Good inspiratory effort. . Abdomen: Soft nontender nondistended with normal bowel sounds x4 quadrants. . Musculoskeletal: No lower extremity edema. 2/4 pulses in all 4 extremities. . Skin: No ulcerative lesions noted or rashes, . Psychiatry: Mood is appropriate for condition and setting speech is slightly improved. And slowed    Data Reviewed: CBC: Recent Labs  Lab 01/28/20 0249 01/29/20 0209 01/30/20 0152 01/31/20 0349 02/01/20 0146  WBC 4.2 3.2* 3.3* 3.5* 3.8*  HGB 13.8 13.1 12.3* 11.5* 11.5*  HCT 43.4 40.7 38.4* 36.0* 33.8*  MCV 89.1 88.3 88.9 89.6 86.7  PLT 80* 73* 66* 65* 66*   Basic Metabolic Panel: Recent Labs  Lab 01/28/20 0249 01/29/20 0209 01/30/20 0152 01/31/20 0349 02/01/20 0922  NA 139 136 137 140 139  K 3.2* 3.1* 4.0 3.9 4.1  CL 105 102 106 107 104  CO2 24 24 25 27 27   GLUCOSE 130* 124* 129* 124* 119*  BUN 19 19 21* 15 17  CREATININE 1.19 1.24 1.26* 1.14 1.15  CALCIUM 9.0 8.6* 8.3* 8.3* 8.7*  MG  --  1.5* 1.9  --  1.7  PHOS  --   --  2.5  --  2.4*   GFR: Estimated Creatinine Clearance: 73.3 mL/min (by C-G formula based on SCr of 1.15 mg/dL). Liver Function Tests: Recent Labs  Lab 01/30/20 0152  AST 16  ALT 10  ALKPHOS 51  BILITOT 0.9  PROT 5.5*  ALBUMIN 2.9*   No results for input(s): LIPASE, AMYLASE in the last 168 hours. No results for input(s): AMMONIA in the last 168 hours. Coagulation Profile: No results for input(s): INR, PROTIME in the last 168 hours. Cardiac Enzymes: No results for  input(s): CKTOTAL, CKMB, CKMBINDEX, TROPONINI in the last 168 hours. BNP (last 3 results) No results for input(s): PROBNP in the last 8760 hours. HbA1C: No results for input(s): HGBA1C in the last 72 hours. CBG: Recent Labs  Lab 02/02/20 0753 02/02/20 1147 02/02/20 1730 02/02/20 2124 02/03/20 0920  GLUCAP 82 117* 106* 225* 201*   Lipid Profile: No results for input(s): CHOL, HDL, LDLCALC, TRIG, CHOLHDL, LDLDIRECT in the last 72 hours. Thyroid Function Tests: No results for input(s): TSH, T4TOTAL, FREET4, T3FREE, THYROIDAB in the last 72 hours. Anemia Panel: No results for input(s): VITAMINB12, FOLATE, FERRITIN, TIBC, IRON, RETICCTPCT in the last 72 hours. Urine analysis:    Component Value Date/Time   COLORURINE YELLOW 01/28/2020 0727   APPEARANCEUR HAZY (A) 01/28/2020 0727   LABSPEC 1.015 01/28/2020 0727   PHURINE 6.0 01/28/2020 0727   GLUCOSEU 50 (A) 01/28/2020 0727   HGBUR SMALL (A) 01/28/2020 0727   BILIRUBINUR NEGATIVE 01/28/2020 0727   KETONESUR 5 (A) 01/28/2020 0727   PROTEINUR NEGATIVE 01/28/2020  0321   NITRITE NEGATIVE 01/28/2020 0727   LEUKOCYTESUR NEGATIVE 01/28/2020 0727   Sepsis Labs: @LABRCNTIP (procalcitonin:4,lacticidven:4)  ) Recent Results (from the past 240 hour(s))  Culture, Urine     Status: Abnormal   Collection Time: 01/28/20  6:32 AM   Specimen: Urine, Clean Catch  Result Value Ref Range Status   Specimen Description URINE, CLEAN CATCH  Final   Special Requests   Final    NONE Performed at [facility], [address], [place], [place]    Culture >=100,000 COLONIES/mL ENTEROBACTER CLOACAE (A)  Final   Report Status 01/30/2020 FINAL  Final   Organism ID, Bacteria ENTEROBACTER CLOACAE (A)  Final      Susceptibility   Enterobacter cloacae - MIC*    CEFAZOLIN >=64 RESISTANT Resistant     CEFEPIME <=0.12 SENSITIVE Sensitive     CIPROFLOXACIN <=0.25 SENSITIVE Sensitive     GENTAMICIN <=1 SENSITIVE Sensitive     IMIPENEM 4  SENSITIVE Sensitive     NITROFURANTOIN 64 INTERMEDIATE Intermediate     TRIMETH/SULFA <=20 SENSITIVE Sensitive     PIP/TAZO <=4 SENSITIVE Sensitive     * >=100,000 COLONIES/mL ENTEROBACTER CLOACAE      Studies: No results found.  Scheduled Meds: . amLODipine  10 mg Oral Daily  . apixaban  5 mg Oral BID  . atorvastatin  80 mg Oral Daily  . insulin aspart  0-9 Units Subcutaneous TID WC  . lisinopril  20 mg Oral Daily  . magnesium oxide  400 mg Oral BID  . sodium chloride flush  3 mL Intravenous Q12H    Continuous Infusions:   LOS: 10 days     02/01/2020, MD [facility]  To reach me or the doctor on call, go to: [email or web address] Password [email or web address]  02/03/2020, 10:28 AM

## 2020-02-03 NOTE — Progress Notes (Signed)
Called mother of pt to see if she could come and stay with the patient for his safety.  She will come and be with the pt today.

## 2020-02-03 NOTE — Progress Notes (Signed)
Hypoglycemic Event  CBG: 66  Treatment: 4 oz juice/soda  Symptoms: Nervous/irritable  Follow-up CBG: Time:1255 CBG Result:160  Possible Reasons for Event: Inadequate meal intake  Comments/MD notified:Dr. Barrie Folk at bedside    Earna Coder B

## 2020-02-03 NOTE — Progress Notes (Signed)
Pt's BP at 165/95 this time. Pt refusing hydralazine med. Educated pt.

## 2020-02-04 LAB — GLUCOSE, CAPILLARY
Glucose-Capillary: 105 mg/dL — ABNORMAL HIGH (ref 70–99)
Glucose-Capillary: 93 mg/dL (ref 70–99)
Glucose-Capillary: 184 mg/dL — ABNORMAL HIGH (ref 70–99)

## 2020-02-04 NOTE — Progress Notes (Signed)
PROGRESS NOTE  Ryleigh Buenger [facility] HUD:[id number] DOB: 1965/05/03 DOA: 01/23/2020 PCP: [facility], [facility]  HPI/Recap of past 68 hours: 55 year old male with medical history of systolic and diastolic heart failure, CKD, CVA, diabetes mellitus type 2, hypertension, left eye blindness presented to the hospital after generalized weakness since December at home.  He was supposed to follow-up with home health at home but due to lack of insurance/acceptance of cash he was unable to get any help.  Patient also complained of decreased oral intake and cognitive deficits since December.  Patient was then admitted to the hospital for further evaluation and treatment.  Patient is waiting for SNF  Subjective: General 24 2022 Patient seen and examined at bedside his affect is very flat Nurse reported that he refuses medication Does not want to talk much this morning he will answer yes or nodes head to questions he stated he did not eat he is not hungry.  February 03, 2020: Patient seen and examined at bedside his mother [name] at bedside.  Nurse had reported that patient got dressed and wanted to go home he did not quite understand what was going on.  He called his mother who is at bedside now February 02, 2020: Patient patient seen and examined at bedside denies any new complain  Assessment/Plan: Principal Problem:   Hypernatremia Active Problems:   Diabetes mellitus without complication (HCC)   History of CVA (cerebrovascular accident)   Essential hypertension   CKD (chronic kidney disease), stage III (HCC)   Chronic CHF (congestive heart failure) (HCC)   Acute hypernatremia  #1 hyponatremia is resolved continue fluids and monitor  2. Dysphagia: Patient has been evaluated by speech therapy she has a mild aspiration risk and so is on dysphagia therapy  3. Acute kidney injury on chronic kidney disease stage IIIb is currently at baseline his creatinine today is 1. 15  4. Metabolic  encephalopathy: He has improved overall his speech is still slightly slow MRI of the brain showed no acute abnormality  5. Generalized weakness/debility secondary to CVA physical therapy is working on him and they are recommending SNF transfer. He will continue statin and Eliquis. We will waiting for SNF transfer   Code Status: Full  Severity of Illness: The appropriate patient status for this patient is INPATIENT. Inpatient status is judged to be reasonable and necessary in order to provide the required intensity of service to ensure the patient's safety. The patient's presenting symptoms, physical exam findings, and initial radiographic and laboratory data in the context of their chronic comorbidities is felt to place them at high risk for further clinical deterioration. Furthermore, it is not anticipated that the patient will be medically stable for discharge from the hospital within 2 midnights of admission. The following factors support the patient status of inpatient.   " The patient's presenting symptoms include stroke. Stroke " The worrisome physical exam findings include stroke " The initial radiographic and laboratory data are worrisome because of stroke " The chronic co-morbidities include hyponatremia dysphagia metabolic encephalopathy generalized weakness.   * I certify that at the point of admission it is my clinical judgment that the patient will require inpatient hospital care spanning beyond 2 midnights from the point of admission due to high intensity of service, high risk for further deterioration and high frequency of surveillance required.*    Family Communication: Mother [name] at bedside  Disposition Plan: SNF   Consultants:  None  Procedures:  None  Antimicrobials:  None  DVT prophylaxis: Apixaban   Objective: Vitals:   02/03/20 0535 02/03/20 1712 02/03/20 2027 02/04/20 0557  BP: (!) 165/95 (!) 151/94 (!) 152/96 (!) 150/93  Pulse: 62 65 64 65  Resp:  18 18 18 18   Temp: 98.4 F (36.9 C) 98.2 F (36.8 C) 98 F (36.7 C) 98.1 F (36.7 C)  TempSrc: Oral Oral Oral Oral  SpO2: 100% 99% 99% 99%  Weight:        Intake/Output Summary (Last 24 hours) at 02/04/2020 0939 Last data filed at 02/04/2020 0600 Gross per 24 hour  Intake 560 ml  Output --  Net 560 ml   Filed Weights   01/28/20 0500 01/29/20 0534 01/30/20 0500  Weight: 70.1 kg 71.9 kg 71.4 kg   Body mass index is 20.21 kg/m.  Exam:  . General: 55 y.o. year-old male well developed well nourished in no acute distress.  Alert and oriented x3. . Cardiovascular: Regular rate and rhythm with no rubs or gallops.  No thyromegaly or JVD noted.   53 Respiratory: Clear to auscultation with no wheezes or rales. Good inspiratory effort. . Abdomen: Soft nontender nondistended with normal bowel sounds x4 quadrants. . Musculoskeletal: No lower extremity edema. 2/4 pulses in all 4 extremities. . Skin: No ulcerative lesions noted or rashes, . Psychiatry: Mood/affect is flat and setting speech is slightly improved. And slowed    Data Reviewed: CBC: Recent Labs  Lab 01/29/20 0209 01/30/20 0152 01/31/20 0349 02/01/20 0146  WBC 3.2* 3.3* 3.5* 3.8*  HGB 13.1 12.3* 11.5* 11.5*  HCT 40.7 38.4* 36.0* 33.8*  MCV 88.3 88.9 89.6 86.7  PLT 73* 66* 65* 66*   Basic Metabolic Panel: Recent Labs  Lab 01/29/20 0209 01/30/20 0152 01/31/20 0349 02/01/20 0922  NA 136 137 140 139  K 3.1* 4.0 3.9 4.1  CL 102 106 107 104  CO2 24 25 27 27   GLUCOSE 124* 129* 124* 119*  BUN 19 21* 15 17  CREATININE 1.24 1.26* 1.14 1.15  CALCIUM 8.6* 8.3* 8.3* 8.7*  MG 1.5* 1.9  --  1.7  PHOS  --  2.5  --  2.4*   GFR: Estimated Creatinine Clearance: 73.3 mL/min (by C-G formula based on SCr of 1.15 mg/dL). Liver Function Tests: Recent Labs  Lab 01/30/20 0152  AST 16  ALT 10  ALKPHOS 51  BILITOT 0.9  PROT 5.5*  ALBUMIN 2.9*   No results for input(s): LIPASE, AMYLASE in the last 168 hours. No results  for input(s): AMMONIA in the last 168 hours. Coagulation Profile: No results for input(s): INR, PROTIME in the last 168 hours. Cardiac Enzymes: No results for input(s): CKTOTAL, CKMB, CKMBINDEX, TROPONINI in the last 168 hours. BNP (last 3 results) No results for input(s): PROBNP in the last 8760 hours. HbA1C: No results for input(s): HGBA1C in the last 72 hours. CBG: Recent Labs  Lab 02/03/20 0920 02/03/20 1226 02/03/20 1254 02/03/20 1710 02/03/20 2030  GLUCAP 201* 66* 160* 123* 198*   Lipid Profile: No results for input(s): CHOL, HDL, LDLCALC, TRIG, CHOLHDL, LDLDIRECT in the last 72 hours. Thyroid Function Tests: No results for input(s): TSH, T4TOTAL, FREET4, T3FREE, THYROIDAB in the last 72 hours. Anemia Panel: No results for input(s): VITAMINB12, FOLATE, FERRITIN, TIBC, IRON, RETICCTPCT in the last 72 hours. Urine analysis:    Component Value Date/Time   COLORURINE YELLOW 01/28/2020 0727   APPEARANCEUR HAZY (A) 01/28/2020 0727   LABSPEC 1.015 01/28/2020 0727   PHURINE 6.0 01/28/2020 0727   GLUCOSEU 50 (A) 01/28/2020  0727   HGBUR SMALL (A) 01/28/2020 0727   BILIRUBINUR NEGATIVE 01/28/2020 0727   KETONESUR 5 (A) 01/28/2020 0727   PROTEINUR NEGATIVE 01/28/2020 0727   NITRITE NEGATIVE 01/28/2020 0727   LEUKOCYTESUR NEGATIVE 01/28/2020 0727   Sepsis Labs: @LABRCNTIP (procalcitonin:4,lacticidven:4)  ) Recent Results (from the past 240 hour(s))  Culture, Urine     Status: Abnormal   Collection Time: 01/28/20  6:32 AM   Specimen: Urine, Clean Catch  Result Value Ref Range Status   Specimen Description URINE, CLEAN CATCH  Final   Special Requests   Final    NONE Performed at [facility], [address], [place], [place]    Culture >=100,000 COLONIES/mL ENTEROBACTER CLOACAE (A)  Final   Report Status 01/30/2020 FINAL  Final   Organism ID, Bacteria ENTEROBACTER CLOACAE (A)  Final      Susceptibility   Enterobacter cloacae - MIC*    CEFAZOLIN >=64  RESISTANT Resistant     CEFEPIME <=0.12 SENSITIVE Sensitive     CIPROFLOXACIN <=0.25 SENSITIVE Sensitive     GENTAMICIN <=1 SENSITIVE Sensitive     IMIPENEM 4 SENSITIVE Sensitive     NITROFURANTOIN 64 INTERMEDIATE Intermediate     TRIMETH/SULFA <=20 SENSITIVE Sensitive     PIP/TAZO <=4 SENSITIVE Sensitive     * >=100,000 COLONIES/mL ENTEROBACTER CLOACAE      Studies: No results found.  Scheduled Meds: . amLODipine  10 mg Oral Daily  . apixaban  5 mg Oral BID  . atorvastatin  80 mg Oral Daily  . insulin aspart  0-9 Units Subcutaneous TID WC  . lisinopril  20 mg Oral Daily  . magnesium oxide  400 mg Oral BID  . sodium chloride flush  3 mL Intravenous Q12H    Continuous Infusions:   LOS: 11 days     02/01/2020, MD [facility]  To reach me or the doctor on call, go to: [email or web address] Password [facility]  02/04/2020, 9:39 AM

## 2020-02-05 ENCOUNTER — Ambulatory Visit: Payer: Self-pay | Admitting: Cardiology

## 2020-02-05 LAB — GLUCOSE, CAPILLARY
Glucose-Capillary: 86 mg/dL (ref 70–99)
Glucose-Capillary: 166 mg/dL — ABNORMAL HIGH (ref 70–99)
Glucose-Capillary: 179 mg/dL — ABNORMAL HIGH (ref 70–99)
Glucose-Capillary: 70 mg/dL (ref 70–99)

## 2020-02-05 NOTE — Progress Notes (Signed)
Occupational Therapy Treatment Patient Details Name: Kevin [name] MRN: [id number] DOB: [birth year] Today's Date: 02/05/2020    History of present illness Pt is a 55 y/o male admitted secondary to weakness, generalized decline at home, and hyponatremia. PMH includes L eye blindness, DM, CVA, HTN, CHF, and CKD.   OT comments  Pt making good progress with functional goals. OT will continue to follow acutely to maximize level of function and safety  Follow Up Recommendations  Supervision/Assistance - 24 hour;SNF    Equipment Recommendations  Other (comment) (TBD at next venue of care)    Recommendations for Other Services      Precautions / Restrictions Precautions Precautions: Fall Restrictions Weight Bearing Restrictions: No       Mobility Bed Mobility Overal bed mobility: Needs Assistance Bed Mobility: Sit to Supine       Sit to supine: Min guard   General bed mobility comments: use of bed features, HOB elevated ~20 deg  Transfers Overall transfer level: Needs assistance Equipment used: Rolling walker (2 wheeled) Transfers: Sit to/from Stand Sit to Stand: Min guard         General transfer comment: min guard to stand from bed - RW, stand - sit from RW - recliner    Balance Overall balance assessment: Needs assistance Sitting-balance support: No upper extremity supported;Feet supported Sitting balance-Leahy Scale: Fair     Standing balance support: No upper extremity supported Standing balance-Leahy Scale: Poor Standing balance comment: unsteady without UE support, minor LOB x2 when turning needing minA                           ADL either performed or assessed with clinical judgement   ADL Overall ADL's : Needs assistance/impaired Eating/Feeding: Set up;Sitting   Grooming: Standing;Wash/dry hands;Min guard;Wash/dry face;Cueing for safety   Upper Body Bathing: Min guard;Sitting Upper Body Bathing Details (indicate cue type and reason):  simulated, cues for thouroughness     Upper Body Dressing : Min guard;Sitting   Lower Body Dressing: Minimal assistance Lower Body Dressing Details (indicate cue type and reason): donning socks seated EOB Toilet Transfer: RW;Ambulation;Min guard;Cueing for safety;Cueing for sequencing;Comfort height toilet   Toileting- Clothing Manipulation and Hygiene: Sit to/from stand;Minimal assistance       Functional mobility during ADLs: Rolling walker;Cueing for safety;Cueing for sequencing;Min guard General ADL Comments: pt very slow to process with cognition being a very limiting factor to ADL engagement     Vision Baseline Vision/History: Retinopathy Patient Visual Report: No change from baseline     Perception     Praxis      Cognition Arousal/Alertness: Awake/alert Behavior During Therapy: Flat affect Overall Cognitive Status: No family/caregiver present to determine baseline cognitive functioning Area of Impairment: Awareness;Following commands;Attention;Problem solving;Memory;Safety/judgement                   Current Attention Level: Sustained Memory: Decreased short-term memory Following Commands: Follows one step commands with increased time;Follows one step commands consistently Safety/Judgement: Decreased awareness of safety;Decreased awareness of deficits Awareness: Intellectual Problem Solving: Slow processing;Requires verbal cues;Requires tactile cues;Decreased initiation;Difficulty sequencing General Comments: pt slow to respond and follow one step commands needing increased time and tactiles cues at times to intiate mobility tasks; requires repeated cues to push from surface and not try to pull up on RW        Exercises     Shoulder Instructions       General Comments pt drowsy initially  but easily awoken and agreeable to therapy session with encouragement; of note, chair pad was wet under pt, pt reports he does not need to use bathroom during session  for cleanup    Pertinent Vitals/ Pain       Pain Assessment: No/denies pain Faces Pain Scale: No hurt Pain Intervention(s): Monitored during session;Repositioned  Home Living                                          Prior Functioning/Environment              Frequency  Min 2X/week        Progress Toward Goals  OT Goals(current goals can now be found in the care plan section)  Progress towards OT goals: Progressing toward goals  Acute Rehab OT Goals Patient Stated Goal: not stated  Plan Discharge plan remains appropriate    Co-evaluation                 AM-PAC OT "6 Clicks" Daily Activity     Outcome Measure   Help from another person eating meals?: None Help from another person taking care of personal grooming?: A Little Help from another person toileting, which includes using toliet, bedpan, or urinal?: A Little Help from another person bathing (including washing, rinsing, drying)?: A Lot Help from another person to put on and taking off regular upper body clothing?: A Little Help from another person to put on and taking off regular lower body clothing?: A Lot 6 Click Score: 17    End of Session Equipment Utilized During Treatment: Gait belt;Rolling walker  OT Visit Diagnosis: Unsteadiness on feet (R26.81);Cognitive communication deficit (R41.841);Low vision, both eyes (H54.2);Other symptoms and signs involving cognitive function;Muscle weakness (generalized) (M62.81);Other abnormalities of gait and mobility (R26.89)   Activity Tolerance Patient tolerated treatment well   Patient Left in chair;with call bell/phone within reach;with chair alarm set   Nurse Communication Mobility status        Time: 0623-7628 OT Time Calculation (min): 27 min  Charges: OT General Charges $OT Visit: 1 Visit OT Treatments $Self Care/Home Management : 8-22 mins $Therapeutic Activity: 8-22 mins    Galen Manila 02/05/2020, 3:42  PM

## 2020-02-05 NOTE — Progress Notes (Signed)
CM called [facility] and they have no record of patient for a medicaid disability application. She suggested I reach out to patients DSS worker.  CM has left voicemail for [employer].  TOC following.

## 2020-02-05 NOTE — Progress Notes (Signed)
PROGRESS NOTE  Kevin [name] DOB: [birth year] DOA: 01/23/2020 PCP: Medicine, [facility]  HPI/Recap of past 32 hours: 55 year old male with medical history of systolic and diastolic heart failure, CKD, CVA, diabetes mellitus type 2, hypertension, left eye blindness presented to the hospital after generalized weakness since December at home.  He was supposed to follow-up with home health at home but due to lack of insurance/acceptance of cash he was unable to get any help.  Patient also complained of decreased oral intake and cognitive deficits since December.  Patient was then admitted to the hospital for further evaluation and treatment.  Patient is waiting for SNF  Subjective: February 05, 2020: Patient seen and examined at bedside her his mother Kevin [name] is in the room.  Patient is noted to be eating much better today even though he is not making conversation but he is answering simple questions.  Still waiting for SNF February 04 2020 Patient seen and examined at bedside his affect is very flat Nurse reported that he refuses medication Does not want to talk much this morning he will answer yes or nodes head to questions he stated he did not eat he is not hungry.  February 03, 2020: Patient seen and examined at bedside his mother Kevin [name] at bedside.  Nurse had reported that patient got dressed and wanted to go home he did not quite understand what was going on.  He called his mother who is at bedside now February 02, 2020: Patient patient seen and examined at bedside denies any new complain  Assessment/Plan: Principal Problem:   Hypernatremia Active Problems:   Diabetes mellitus without complication (HCC)   History of CVA (cerebrovascular accident)   Essential hypertension   CKD (chronic kidney disease), stage III (HCC)   Chronic CHF (congestive heart failure) (HCC)   Acute hypernatremia  #1 hyponatremia is resolved continue fluids and monitor  2.  Dysphagia: Patient has been evaluated by speech therapy she has a mild aspiration risk and so is on dysphagia therapy  3. Acute kidney injury on chronic kidney disease stage IIIb is currently at baseline his creatinine today is 1. 15  4. Metabolic encephalopathy: He has improved overall his speech is still slightly slow MRI of the brain showed no acute abnormality  5. Generalized weakness/debility secondary to CVA physical therapy is working on him and they are recommending SNF transfer. He will continue statin and Eliquis. We will waiting for SNF transfer   Code Status: Full  Severity of Illness: The appropriate patient status for this patient is INPATIENT. Inpatient status is judged to be reasonable and necessary in order to provide the required intensity of service to ensure the patient's safety. The patient's presenting symptoms, physical exam findings, and initial radiographic and laboratory data in the context of their chronic comorbidities is felt to place them at high risk for further clinical deterioration. Furthermore, it is not anticipated that the patient will be medically stable for discharge from the hospital within 2 midnights of admission. The following factors support the patient status of inpatient.   " The patient's presenting symptoms include stroke. Stroke " The worrisome physical exam findings include stroke " The initial radiographic and laboratory data are worrisome because of stroke " The chronic co-morbidities include hyponatremia dysphagia metabolic encephalopathy generalized weakness.   * I certify that at the point of admission it is my clinical judgment that the patient will require inpatient hospital care spanning beyond 2 midnights from the point of  admission due to high intensity of service, high risk for further deterioration and high frequency of surveillance required.*    Family Communication: Mother Kevin [name] at bedside  Disposition Plan:  SNF   Consultants:  None  Procedures:  None  Antimicrobials:  None  DVT prophylaxis: Apixaban   Objective: Vitals:   02/04/20 1929 02/05/20 0612 02/05/20 0734 02/05/20 1129  BP: (!) 164/86 (!) 184/93 (!) 152/93 136/83  Pulse: 71 71 71 66  Resp: 20 18 18 18   Temp: 98.7 F (37.1 C) 98.4 F (36.9 C) 98.1 F (36.7 C) 97.9 F (36.6 C)  TempSrc: Oral Oral Oral Oral  SpO2: 98% 99% 99% 100%  Weight:        Intake/Output Summary (Last 24 hours) at 02/05/2020 1143 Last data filed at 02/05/2020 0840 Gross per 24 hour  Intake 480 ml  Output -  Net 480 ml   Filed Weights   01/28/20 0500 01/29/20 0534 01/30/20 0500  Weight: 70.1 kg 71.9 kg 71.4 kg   Body mass index is 20.21 kg/m.  Exam:  . General: 55 y.o. year-old male well developed well nourished in no acute distress.  Alert and oriented x3. . Cardiovascular: Regular rate and rhythm with no rubs or gallops.  No thyromegaly or JVD noted.   53 Respiratory: Clear to auscultation with no wheezes or rales. Good inspiratory effort. . Abdomen: Soft nontender nondistended with normal bowel sounds x4 quadrants. . Musculoskeletal: No lower extremity edema. 2/4 pulses in all 4 extremities. . Skin: No ulcerative lesions noted or rashes, . Psychiatry: Mood/affect is flat and setting speech is slightly improved. And slowed    Data Reviewed: CBC: Recent Labs  Lab 01/30/20 0152 01/31/20 0349 02/01/20 0146  WBC 3.3* 3.5* 3.8*  HGB 12.3* 11.5* 11.5*  HCT 38.4* 36.0* 33.8*  MCV 88.9 89.6 86.7  PLT 66* 65* 66*   Basic Metabolic Panel: Recent Labs  Lab 01/30/20 0152 01/31/20 0349 02/01/20 0922  NA 137 140 139  K 4.0 3.9 4.1  CL 106 107 104  CO2 25 27 27   GLUCOSE 129* 124* 119*  BUN 21* 15 17  CREATININE 1.26* 1.14 1.15  CALCIUM 8.3* 8.3* 8.7*  MG 1.9  --  1.7  PHOS 2.5  --  2.4*   GFR: Estimated Creatinine Clearance: 73.3 mL/min (by C-G formula based on SCr of 1.15 mg/dL). Liver Function Tests: Recent Labs   Lab 01/30/20 0152  AST 16  ALT 10  ALKPHOS 51  BILITOT 0.9  PROT 5.5*  ALBUMIN 2.9*   No results for input(s): LIPASE, AMYLASE in the last 168 hours. No results for input(s): AMMONIA in the last 168 hours. Coagulation Profile: No results for input(s): INR, PROTIME in the last 168 hours. Cardiac Enzymes: No results for input(s): CKTOTAL, CKMB, CKMBINDEX, TROPONINI in the last 168 hours. BNP (last 3 results) No results for input(s): PROBNP in the last 8760 hours. HbA1C: No results for input(s): HGBA1C in the last 72 hours. CBG: Recent Labs  Lab 02/04/20 1134 02/04/20 1630 02/04/20 2038 02/05/20 0727 02/05/20 1128  GLUCAP 105* 93 184* 86 166*   Lipid Profile: No results for input(s): CHOL, HDL, LDLCALC, TRIG, CHOLHDL, LDLDIRECT in the last 72 hours. Thyroid Function Tests: No results for input(s): TSH, T4TOTAL, FREET4, T3FREE, THYROIDAB in the last 72 hours. Anemia Panel: No results for input(s): VITAMINB12, FOLATE, FERRITIN, TIBC, IRON, RETICCTPCT in the last 72 hours. Urine analysis:    Component Value Date/Time   COLORURINE YELLOW 01/28/2020 0727  APPEARANCEUR HAZY (A) 01/28/2020 0727   LABSPEC 1.015 01/28/2020 0727   PHURINE 6.0 01/28/2020 0727   GLUCOSEU 50 (A) 01/28/2020 0727   HGBUR SMALL (A) 01/28/2020 0727   BILIRUBINUR NEGATIVE 01/28/2020 0727   KETONESUR 5 (A) 01/28/2020 0727   PROTEINUR NEGATIVE 01/28/2020 0727   NITRITE NEGATIVE 01/28/2020 0727   LEUKOCYTESUR NEGATIVE 01/28/2020 0727   Sepsis Labs: @LABRCNTIP (procalcitonin:4,lacticidven:4)  ) Recent Results (from the past 240 hour(s))  Culture, Urine     Status: Abnormal   Collection Time: 01/28/20  6:32 AM   Specimen: Urine, Clean Catch  Result Value Ref Range Status   Specimen Description URINE, CLEAN CATCH  Final   Special Requests   Final    NONE Performed at [facility], [address] [place], [place]    Culture >=100,000 COLONIES/mL ENTEROBACTER CLOACAE (A)  Final    Report Status 01/30/2020 FINAL  Final   Organism ID, Bacteria ENTEROBACTER CLOACAE (A)  Final      Susceptibility   Enterobacter cloacae - MIC*    CEFAZOLIN >=64 RESISTANT Resistant     CEFEPIME <=0.12 SENSITIVE Sensitive     CIPROFLOXACIN <=0.25 SENSITIVE Sensitive     GENTAMICIN <=1 SENSITIVE Sensitive     IMIPENEM 4 SENSITIVE Sensitive     NITROFURANTOIN 64 INTERMEDIATE Intermediate     TRIMETH/SULFA <=20 SENSITIVE Sensitive     PIP/TAZO <=4 SENSITIVE Sensitive     * >=100,000 COLONIES/mL ENTEROBACTER CLOACAE      Studies: No results found.  Scheduled Meds: . amLODipine  10 mg Oral Daily  . apixaban  5 mg Oral BID  . atorvastatin  80 mg Oral Daily  . insulin aspart  0-9 Units Subcutaneous TID WC  . lisinopril  20 mg Oral Daily  . magnesium oxide  400 mg Oral BID  . sodium chloride flush  3 mL Intravenous Q12H    Continuous Infusions:   LOS: 12 days     02/01/2020, MD [facility]  To reach me or the doctor on call, go to: [email or web address] Password [facility]  02/05/2020, 11:43 AM

## 2020-02-05 NOTE — Progress Notes (Signed)
Physical Therapy Treatment Patient Details Name: Kevin [name] MRN: [id number] DOB: December 01, 1965 Today's Date: 02/05/2020    History of Present Illness Pt is a 55 y/o male admitted secondary to weakness, generalized decline at home, and hyponatremia. PMH includes L eye blindness, DM, CVA, HTN, CHF, and CKD.    PT Comments    Pt up in chair, agreeable to therapy session with encouragement and with good participation and tolerance for mobility. Pt remains somewhat unsteady and reliant on RW for balance during gait trial, mostly min guard for support needed but x2 episodes of minA needed due to mild lateral LOB while turning. Pt able to tolerate household distance gait task with mild to moderate fatigue reported at end of trial. He also exhibits decreased safety awareness and attempted to abandon RW prior to reaching seated surface despite unsteadiness. Pt continues to benefit from PT services to progress toward functional mobility goals. Continue to recommend SNF.    Follow Up Recommendations  SNF;Supervision/Assistance - 24 hour     Equipment Recommendations  Other (comment);Rolling walker with 5" wheels (defer to next location)    Recommendations for Other Services       Precautions / Restrictions Precautions Precautions: Fall Restrictions Weight Bearing Restrictions: No    Mobility  Bed Mobility Overal bed mobility: Needs Assistance Bed Mobility: Sit to Supine       Sit to supine: Min guard   General bed mobility comments: use of bed features, HOB elevated ~20 deg  Transfers Overall transfer level: Needs assistance Equipment used: Rolling walker (2 wheeled) Transfers: Sit to/from Stand Sit to Stand: Min guard         General transfer comment: min guard to stand from chair>RW  Ambulation/Gait Ambulation/Gait assistance: Min guard;Min assist Gait Distance (Feet): 125 Feet (x2 with standing break) Assistive device: Rolling walker (2 wheeled) Gait  Pattern/deviations: Step-through pattern;Narrow base of support (B ER at hips, some inconsistent foot placement at times (L eye blind, of note)) Gait velocity: decreased   General Gait Details: mildly unsteady, however self-corrects with use of RW; able to maneuver around obstacles in his room   Stairs             Wheelchair Mobility    Modified Rankin (Stroke Patients Only)       Balance Overall balance assessment: Needs assistance Sitting-balance support: No upper extremity supported;Feet supported Sitting balance-Leahy Scale: Fair     Standing balance support: No upper extremity supported Standing balance-Leahy Scale: Poor Standing balance comment: unsteady without UE support, minor LOB x2 when turning needing minA                            Cognition Arousal/Alertness: Awake/alert Behavior During Therapy: Flat affect Overall Cognitive Status: No family/caregiver present to determine baseline cognitive functioning Area of Impairment: Awareness;Following commands;Attention;Problem solving;Memory;Safety/judgement                   Current Attention Level: Sustained Memory: Decreased short-term memory Following Commands: Follows one step commands with increased time;Follows one step commands consistently Safety/Judgement: Decreased awareness of safety;Decreased awareness of deficits Awareness: Intellectual Problem Solving: Slow processing;Requires verbal cues;Requires tactile cues;Decreased initiation;Difficulty sequencing General Comments: pt slow to respond and follow one step commands needing increased time and tactiles cues at times to intiate mobility tasks; requires repeated cues to push from surface and not try to pull up on RW      Exercises  General Comments General comments (skin integrity, edema, etc.): pt drowsy initially but easily awoken and agreeable to therapy session with encouragement; of note, chair pad was wet under pt, pt  reports he does not need to use bathroom during session for cleanup      Pertinent Vitals/Pain Pain Assessment: No/denies pain Faces Pain Scale: No hurt Pain Intervention(s): Monitored during session;Repositioned    Home Living                      Prior Function            PT Goals (current goals can now be found in the care plan section) Acute Rehab PT Goals Patient Stated Goal: not stated PT Goal Formulation: Patient unable to participate in goal setting Time For Goal Achievement: 02/07/20 Potential to Achieve Goals: Fair Progress towards PT goals: Progressing toward goals    Frequency    Min 2X/week      PT Plan Current plan remains appropriate    Co-evaluation              AM-PAC PT "6 Clicks" Mobility   Outcome Measure  Help needed turning from your back to your side while in a flat bed without using bedrails?: None Help needed moving from lying on your back to sitting on the side of a flat bed without using bedrails?: A Lot Help needed moving to and from a bed to a chair (including a wheelchair)?: A Little Help needed standing up from a chair using your arms (e.g., wheelchair or bedside chair)?: A Little Help needed to walk in hospital room?: A Little Help needed climbing 3-5 steps with a railing? : A Little 6 Click Score: 18    End of Session Equipment Utilized During Treatment: Gait belt Activity Tolerance: Patient tolerated treatment well Patient left: in bed;with bed alarm set;with call bell/phone within reach Nurse Communication: Mobility status PT Visit Diagnosis: Unsteadiness on feet (R26.81);Muscle weakness (generalized) (M62.81)     Time: 3016-0109 PT Time Calculation (min) (ACUTE ONLY): 14 min  Charges:  $Gait Training: 8-22 mins                     Dorella Laster P., PTA Acute Rehabilitation Services Pager: [phone] Office: [phone]   Angus Palms 02/05/2020, 3:21 PM

## 2020-02-06 LAB — GLUCOSE, CAPILLARY
Glucose-Capillary: 100 mg/dL — ABNORMAL HIGH (ref 70–99)
Glucose-Capillary: 162 mg/dL — ABNORMAL HIGH (ref 70–99)
Glucose-Capillary: 104 mg/dL — ABNORMAL HIGH (ref 70–99)
Glucose-Capillary: 125 mg/dL — ABNORMAL HIGH (ref 70–99)

## 2020-02-06 NOTE — Progress Notes (Signed)
PROGRESS NOTE  Kevin [name] XLK:[id number] DOB: [birth year] DOA: 01/23/2020 PCP: [facility], [facility]  HPI/Recap of past 56 hours: 55 year old male with medical history of systolic and diastolic heart failure, CKD, CVA, diabetes mellitus type 2, hypertension, left eye blindness presented to the hospital after generalized weakness since December at home.  He was supposed to follow-up with home health at home but due to lack of insurance/acceptance of cash he was unable to get any help.  Patient also complained of decreased oral intake and cognitive deficits since December.  Patient was then admitted to the hospital for further evaluation and treatment.  Patient is waiting for SNF  Subjective: February 06, 2020 Patient seen and examined at bedside he is lying quietly in bed he is answering yes or no and nodding of his head to questions.  He really remember that his mother was here yesterday.  Speech is and thinking is kind of slowed.  Still waiting for nursing home placement February 05, 2020: Patient seen and examined at bedside her his mother Kevin [name] is in the room.  Patient is noted to be eating much better today even though he is not making conversation but he is answering simple questions.  Still waiting for SNF February 04 2020 Patient seen and examined at bedside his affect is very flat Nurse reported that he refuses medication Does not want to talk much this morning he will answer yes or nodes head to questions he stated he did not eat he is not hungry.  February 03, 2020: Patient seen and examined at bedside his mother Kevin [name] at bedside.  Nurse had reported that patient got dressed and wanted to go home he did not quite understand what was going on.  He called his mother who is at bedside now February 02, 2020: Patient patient seen and examined at bedside denies any new complain  Assessment/Plan: Principal Problem:   Hypernatremia Active Problems:   Diabetes mellitus  without complication (HCC)   History of CVA (cerebrovascular accident)   Essential hypertension   CKD (chronic kidney disease), stage III (HCC)   Chronic CHF (congestive heart failure) (HCC)   Acute hypernatremia  #1 hyponatremia is resolved continue fluids and monitor  2. Dysphagia: Patient has been evaluated by speech therapy she has a mild aspiration risk and so is on dysphagia therapy  3. Acute kidney injury on chronic kidney disease stage IIIb is currently at baseline his creatinine today is 1. 15  4. Metabolic encephalopathy: He has improved overall his speech is still slightly slow MRI of the brain showed no acute abnormality  5. Generalized weakness/debility secondary to CVA physical therapy is working on him and they are recommending SNF transfer. He will continue statin and Eliquis. We will waiting for SNF transfer  6.  Uncontrolled hypertension.  Patient had refused his medicine occasionally particularly yesterday so his blood pressure is elevated we will continue his lisinopril 20 mg daily and monitor   Code Status: Full  Severity of Illness: The appropriate patient status for this patient is INPATIENT. Inpatient status is judged to be reasonable and necessary in order to provide the required intensity of service to ensure the patient's safety. The patient's presenting symptoms, physical exam findings, and initial radiographic and laboratory data in the context of their chronic comorbidities is felt to place them at high risk for further clinical deterioration. Furthermore, it is not anticipated that the patient will be medically stable for discharge from the hospital within 2 midnights  of admission. The following factors support the patient status of inpatient.   " The patient's presenting symptoms include stroke. Stroke " The worrisome physical exam findings include stroke " The initial radiographic and laboratory data are worrisome because of stroke " The chronic  co-morbidities include hyponatremia dysphagia metabolic encephalopathy generalized weakness.   * I certify that at the point of admission it is my clinical judgment that the patient will require inpatient hospital care spanning beyond 2 midnights from the point of admission due to high intensity of service, high risk for further deterioration and high frequency of surveillance required.*    Family Communication: Mother Kevin [name] at bedside  Disposition Plan: SNF   Consultants:  None  Procedures:  None  Antimicrobials:  None  DVT prophylaxis: Apixaban   Objective: Vitals:   02/05/20 1727 02/05/20 1936 02/06/20 0500 02/06/20 0513  BP: (!) 159/85 (!) 168/94  (!) 162/97  Pulse: 61 70  64  Resp: 20 18  17   Temp: 99.2 F (37.3 C) 98.3 F (36.8 C)  98.1 F (36.7 C)  TempSrc: Oral Oral  Oral  SpO2: 100% 100%  99%  Weight:   71.1 kg     Intake/Output Summary (Last 24 hours) at 02/06/2020 1015 Last data filed at 02/06/2020 02/08/2020 Gross per 24 hour  Intake 840 ml  Output 450 ml  Net 390 ml   Filed Weights   01/29/20 0534 01/30/20 0500 02/06/20 0500  Weight: 71.9 kg 71.4 kg 71.1 kg   Body mass index is 20.13 kg/m.  Exam:  . General: 55 y.o. year-old male well developed well nourished in no acute distress.  Alert and oriented x3. . Cardiovascular: Regular rate and rhythm with no rubs or gallops.  No thyromegaly or JVD noted.   53 Respiratory: Clear to auscultation with no wheezes or rales. Good inspiratory effort. . Abdomen: Soft nontender nondistended with normal bowel sounds x4 quadrants. . Musculoskeletal: No lower extremity edema. 2/4 pulses in all 4 extremities. . Skin: No ulcerative lesions noted or rashes, . Psychiatry: Mood/affect is flat and setting speech is slightly improved. And slowed    Data Reviewed: CBC: Recent Labs  Lab 01/31/20 0349 02/01/20 0146  WBC 3.5* 3.8*  HGB 11.5* 11.5*  HCT 36.0* 33.8*  MCV 89.6 86.7  PLT 65* 66*   Basic Metabolic  Panel: Recent Labs  Lab 01/31/20 0349 02/01/20 0922  NA 140 139  K 3.9 4.1  CL 107 104  CO2 27 27  GLUCOSE 124* 119*  BUN 15 17  CREATININE 1.14 1.15  CALCIUM 8.3* 8.7*  MG  --  1.7  PHOS  --  2.4*   GFR: Estimated Creatinine Clearance: 73 mL/min (by C-G formula based on SCr of 1.15 mg/dL). Liver Function Tests: No results for input(s): AST, ALT, ALKPHOS, BILITOT, PROT, ALBUMIN in the last 168 hours. No results for input(s): LIPASE, AMYLASE in the last 168 hours. No results for input(s): AMMONIA in the last 168 hours. Coagulation Profile: No results for input(s): INR, PROTIME in the last 168 hours. Cardiac Enzymes: No results for input(s): CKTOTAL, CKMB, CKMBINDEX, TROPONINI in the last 168 hours. BNP (last 3 results) No results for input(s): PROBNP in the last 8760 hours. HbA1C: No results for input(s): HGBA1C in the last 72 hours. CBG: Recent Labs  Lab 02/05/20 0727 02/05/20 1128 02/05/20 1724 02/05/20 2053 02/06/20 0737  GLUCAP 86 166* 179* 70 100*   Lipid Profile: No results for input(s): CHOL, HDL, LDLCALC, TRIG, CHOLHDL, LDLDIRECT in the  last 72 hours. Thyroid Function Tests: No results for input(s): TSH, T4TOTAL, FREET4, T3FREE, THYROIDAB in the last 72 hours. Anemia Panel: No results for input(s): VITAMINB12, FOLATE, FERRITIN, TIBC, IRON, RETICCTPCT in the last 72 hours. Urine analysis:    Component Value Date/Time   COLORURINE YELLOW 01/28/2020 0727   APPEARANCEUR HAZY (A) 01/28/2020 0727   LABSPEC 1.015 01/28/2020 0727   PHURINE 6.0 01/28/2020 0727   GLUCOSEU 50 (A) 01/28/2020 0727   HGBUR SMALL (A) 01/28/2020 0727   BILIRUBINUR NEGATIVE 01/28/2020 0727   KETONESUR 5 (A) 01/28/2020 0727   PROTEINUR NEGATIVE 01/28/2020 0727   NITRITE NEGATIVE 01/28/2020 0727   LEUKOCYTESUR NEGATIVE 01/28/2020 0727   Sepsis Labs: @LABRCNTIP (procalcitonin:4,lacticidven:4)  ) Recent Results (from the past 240 hour(s))  Culture, Urine     Status: Abnormal    Collection Time: 01/28/20  6:32 AM   Specimen: Urine, Clean Catch  Result Value Ref Range Status   Specimen Description URINE, CLEAN CATCH  Final   Special Requests   Final    NONE Performed at [facility], [address] [place], [place]    Culture >=100,000 COLONIES/mL ENTEROBACTER CLOACAE (A)  Final   Report Status 01/30/2020 FINAL  Final   Organism ID, Bacteria ENTEROBACTER CLOACAE (A)  Final      Susceptibility   Enterobacter cloacae - MIC*    CEFAZOLIN >=64 RESISTANT Resistant     CEFEPIME <=0.12 SENSITIVE Sensitive     CIPROFLOXACIN <=0.25 SENSITIVE Sensitive     GENTAMICIN <=1 SENSITIVE Sensitive     IMIPENEM 4 SENSITIVE Sensitive     NITROFURANTOIN 64 INTERMEDIATE Intermediate     TRIMETH/SULFA <=20 SENSITIVE Sensitive     PIP/TAZO <=4 SENSITIVE Sensitive     * >=100,000 COLONIES/mL ENTEROBACTER CLOACAE      Studies: No results found.  Scheduled Meds: . amLODipine  10 mg Oral Daily  . apixaban  5 mg Oral BID  . atorvastatin  80 mg Oral Daily  . insulin aspart  0-9 Units Subcutaneous TID WC  . lisinopril  20 mg Oral Daily  . magnesium oxide  400 mg Oral BID  . sodium chloride flush  3 mL Intravenous Q12H    Continuous Infusions:   LOS: 13 days     02/01/2020, MD [facility]  To reach me or the doctor on call, go to: [email or web address] Password [email or web address]  02/06/2020, 10:15 AM

## 2020-02-07 LAB — GLUCOSE, CAPILLARY
Glucose-Capillary: 100 mg/dL — ABNORMAL HIGH (ref 70–99)
Glucose-Capillary: 231 mg/dL — ABNORMAL HIGH (ref 70–99)
Glucose-Capillary: 84 mg/dL (ref 70–99)
Glucose-Capillary: 186 mg/dL — ABNORMAL HIGH (ref 70–99)

## 2020-02-07 NOTE — Progress Notes (Signed)
PROGRESS NOTE  Kevin [name] [facility] HYI:[id number] DOB: 01-Jun-1965 DOA: 01/23/2020 PCP: Medicine, [facility]  HPI/Recap of past 43 hours: 55 year old male with medical history of systolic and diastolic heart failure, CKD, CVA, diabetes mellitus type 2, hypertension, left eye blindness presented to the hospital after generalized weakness since December at home.  He was supposed to follow-up with home health at home but due to lack of insurance/acceptance of cash he was unable to get any help.  Patient also complained of decreased oral intake and cognitive deficits since December.  Patient was then admitted to the hospital for further evaluation and treatment.  Patient is waiting for SNF  Subjective: February 07, 2020: Patient seen and examined at bedside.  Denies any complaints February 06, 2020 Patient seen and examined at bedside he is lying quietly in bed he is answering yes or no and nodding of his head to questions.  He really remember that his mother was here yesterday.  Speech is and thinking is kind of slowed.  Still waiting for nursing home placement February 05, 2020: Patient seen and examined at bedside her his mother [name] is in the room.  Patient is noted to be eating much better today even though he is not making conversation but he is answering simple questions.  Still waiting for SNF February 04 2020 Patient seen and examined at bedside his affect is very flat Nurse reported that he refuses medication Does not want to talk much this morning he will answer yes or nodes head to questions he stated he did not eat he is not hungry.  February 03, 2020: Patient seen and examined at bedside his mother [name] at bedside.  Nurse had reported that patient got dressed and wanted to go home he did not quite understand what was going on.  He called his mother who is at bedside now February 02, 2020: Patient patient seen and examined at bedside denies any new  complain  Assessment/Plan: Principal Problem:   Hypernatremia Active Problems:   Diabetes mellitus without complication (HCC)   History of CVA (cerebrovascular accident)   Essential hypertension   CKD (chronic kidney disease), stage III (HCC)   Chronic CHF (congestive heart failure) (HCC)   Acute hypernatremia  #1 hyponatremia is resolved continue fluids and monitor  2. Dysphagia: Patient has been evaluated by speech therapy she has a mild aspiration risk and so is on dysphagia therapy  3. Acute kidney injury on chronic kidney disease stage IIIb is currently at baseline his creatinine today is 1. 15  4. Metabolic encephalopathy: He has improved overall his speech is still slightly slow MRI of the brain showed no acute abnormality  5. Generalized weakness/debility secondary to CVA physical therapy is working on him and they are recommending SNF transfer. He will continue statin and Eliquis. We will waiting for SNF transfer  6.  Uncontrolled hypertension.  Patient had refused his medicine occasionally particularly yesterday so his blood pressure is elevated we will continue his lisinopril 20 mg daily and monitor   Code Status: Full  Severity of Illness: The appropriate patient status for this patient is INPATIENT. Inpatient status is judged to be reasonable and necessary in order to provide the required intensity of service to ensure the patient's safety. The patient's presenting symptoms, physical exam findings, and initial radiographic and laboratory data in the context of their chronic comorbidities is felt to place them at high risk for further clinical deterioration. Furthermore, it is not anticipated that the  patient will be medically stable for discharge from the hospital within 2 midnights of admission. The following factors support the patient status of inpatient.   " The patient's presenting symptoms include stroke. Stroke " The worrisome physical exam findings include  stroke " The initial radiographic and laboratory data are worrisome because of stroke " The chronic co-morbidities include hyponatremia dysphagia metabolic encephalopathy generalized weakness.   * I certify that at the point of admission it is my clinical judgment that the patient will require inpatient hospital care spanning beyond 2 midnights from the point of admission due to high intensity of service, high risk for further deterioration and high frequency of surveillance required.*    Family Communication: Mother Kevin [name] at bedside  Disposition Plan: SNF   Consultants:  None  Procedures:  None  Antimicrobials:  None  DVT prophylaxis: Apixaban   Objective: Vitals:   02/06/20 0500 02/06/20 0513 02/06/20 1350 02/06/20 2101  BP:  (!) 162/97 (!) 159/86 (!) 157/88  Pulse:  64 67 (!) 52  Resp:  17 19 17   Temp:  98.1 F (36.7 C) 98.5 F (36.9 C) 98 F (36.7 C)  TempSrc:  Oral Oral Oral  SpO2:  99% 99%   Weight: 71.1 kg       Intake/Output Summary (Last 24 hours) at 02/07/2020 1111 Last data filed at 02/07/2020 0916 Gross per 24 hour  Intake 720 ml  Output 950 ml  Net -230 ml   Filed Weights   01/29/20 0534 01/30/20 0500 02/06/20 0500  Weight: 71.9 kg 71.4 kg 71.1 kg   Body mass index is 20.13 kg/m.  Exam:  . General: 55 y.o. year-old male well developed well nourished in no acute distress.  Alert and oriented x3. . Cardiovascular: Regular rate and rhythm with no rubs or gallops.  No thyromegaly or JVD noted.   53 Respiratory: Clear to auscultation with no wheezes or rales. Good inspiratory effort. . Abdomen: Soft nontender nondistended with normal bowel sounds x4 quadrants. . Musculoskeletal: No lower extremity edema. 2/4 pulses in all 4 extremities. . Skin: No ulcerative lesions noted or rashes, . Psychiatry: Mood/affect is flat and setting speech is slightly improved. And slowed    Data Reviewed: CBC: Recent Labs  Lab 02/01/20 0146  WBC 3.8*  HGB  11.5*  HCT 33.8*  MCV 86.7  PLT 66*   Basic Metabolic Panel: Recent Labs  Lab 02/01/20 0922  NA 139  K 4.1  CL 104  CO2 27  GLUCOSE 119*  BUN 17  CREATININE 1.15  CALCIUM 8.7*  MG 1.7  PHOS 2.4*   GFR: Estimated Creatinine Clearance: 73 mL/min (by C-G formula based on SCr of 1.15 mg/dL). Liver Function Tests: No results for input(s): AST, ALT, ALKPHOS, BILITOT, PROT, ALBUMIN in the last 168 hours. No results for input(s): LIPASE, AMYLASE in the last 168 hours. No results for input(s): AMMONIA in the last 168 hours. Coagulation Profile: No results for input(s): INR, PROTIME in the last 168 hours. Cardiac Enzymes: No results for input(s): CKTOTAL, CKMB, CKMBINDEX, TROPONINI in the last 168 hours. BNP (last 3 results) No results for input(s): PROBNP in the last 8760 hours. HbA1C: No results for input(s): HGBA1C in the last 72 hours. CBG: Recent Labs  Lab 02/06/20 0737 02/06/20 1143 02/06/20 1705 02/06/20 2101 02/07/20 0751  GLUCAP 100* 162* 104* 125* 100*   Lipid Profile: No results for input(s): CHOL, HDL, LDLCALC, TRIG, CHOLHDL, LDLDIRECT in the last 72 hours. Thyroid Function Tests: No results for  input(s): TSH, T4TOTAL, FREET4, T3FREE, THYROIDAB in the last 72 hours. Anemia Panel: No results for input(s): VITAMINB12, FOLATE, FERRITIN, TIBC, IRON, RETICCTPCT in the last 72 hours. Urine analysis:    Component Value Date/Time   COLORURINE YELLOW 01/28/2020 0727   APPEARANCEUR HAZY (A) 01/28/2020 0727   LABSPEC 1.015 01/28/2020 0727   PHURINE 6.0 01/28/2020 0727   GLUCOSEU 50 (A) 01/28/2020 0727   HGBUR SMALL (A) 01/28/2020 0727   BILIRUBINUR NEGATIVE 01/28/2020 0727   KETONESUR 5 (A) 01/28/2020 0727   PROTEINUR NEGATIVE 01/28/2020 0727   NITRITE NEGATIVE 01/28/2020 0727   LEUKOCYTESUR NEGATIVE 01/28/2020 0727   Sepsis Labs: @LABRCNTIP (procalcitonin:4,lacticidven:4)  ) No results found for this or any previous visit (from the past 240 hour(s)).     Studies: No results found.  Scheduled Meds: . amLODipine  10 mg Oral Daily  . apixaban  5 mg Oral BID  . atorvastatin  80 mg Oral Daily  . insulin aspart  0-9 Units Subcutaneous TID WC  . lisinopril  20 mg Oral Daily  . magnesium oxide  400 mg Oral BID  . sodium chloride flush  3 mL Intravenous Q12H    Continuous Infusions:   LOS: 14 days     , MD [facility]  To reach me or the doctor on call, go to: [email or web address] Password [email or web address]  02/07/2020, 11:11 AM

## 2020-02-07 NOTE — Progress Notes (Signed)
Occupational Therapy Treatment Patient Details Name: Kevin [name] MRN: [id number] DOB: 20-Jan-1965 Today's Date: 02/07/2020    History of present illness Pt is a 55 y/o male admitted secondary to weakness, generalized decline at home, and hyponatremia. PMH includes L eye blindness, DM, CVA, HTN, CHF, and CKD.   OT comments  Patient found with bed alarm sounding and sitting edge of bed, when asked, patient stated he needed to use the bathroom.  Patient's pad under him was wet, and nursing informed cath may not be sealed/on correctly.  Patient needing cueing as to the direction of the bathroom, and that he was fine to enter.  Patient was able to stand for hygiene, stand to wash his hands with supervision, and cued to clean scrotum given pad was wet.  Nursing tech in room to check blood sugars.  OT will continue to follow the patient in the acute setting.  Cognition continues to be a significant barrier.  SNF has been recommended.    Follow Up Recommendations  Supervision/Assistance - 24 hour;SNF    Equipment Recommendations       Recommendations for Other Services      Precautions / Restrictions Precautions Precautions: Fall Restrictions Weight Bearing Restrictions: No       Mobility Bed Mobility   Bed Mobility: Sit to Supine;Supine to Sit     Supine to sit: Supervision Sit to supine: Supervision      Transfers Overall transfer level: Needs assistance Equipment used: Rolling walker (2 wheeled) Transfers: Sit to/from Stand Sit to Stand: Supervision              Balance Overall balance assessment: Needs assistance Sitting-balance support: No upper extremity supported;Feet supported Sitting balance-Leahy Scale: Good     Standing balance support: Bilateral upper extremity supported Standing balance-Leahy Scale: Poor Standing balance comment: used RW this date                           ADL either performed or assessed with clinical judgement   ADL        Grooming: Standing;Wash/dry hands;Wash/dry face;Supervision/safety                   Toilet Transfer: RW;Ambulation;Comfort height toilet;Supervision/safety   Toileting- Clothing Manipulation and Hygiene: Sit to/from stand;Supervision/safety       Functional mobility during ADLs: Rolling walker;Supervision/safety       Vision  baseline     Perception     Praxis      Cognition Arousal/Alertness: Awake/alert Behavior During Therapy: Flat affect Overall Cognitive Status: History of cognitive impairments - at baseline                         Following Commands: Follows one step commands with increased time;Follows one step commands consistently Safety/Judgement: Decreased awareness of safety   Problem Solving: Slow processing          Exercises     Shoulder Instructions       General Comments patient found trying to get out of bed, stated he needed to use the restroom.  Pad under patient was wet.  Notified nurse cath may need replaced/repositioned.    Pertinent Vitals/ Pain       Pain Assessment: No/denies pain Pain Intervention(s): Monitored during session  Frequency  Min 2X/week        Progress Toward Goals  OT Goals(current goals can now be found in the care plan section)  Progress towards OT goals: Progressing toward goals  Acute Rehab OT Goals Patient Stated Goal: no goals mentioned. OT Goal Formulation: With patient Time For Goal Achievement: 02/22/20 Potential to Achieve Goals: Fair ADL Goals Pt Will Perform Grooming: with modified independence;standing Pt Will Perform Upper Body Bathing: with set-up;sitting Pt Will Perform Lower Body Bathing: with supervision;sit to/from stand Pt Will Perform Upper Body Dressing: with set-up;sitting Pt Will Transfer to Toilet: with modified independence;ambulating;regular height toilet  Plan Discharge  plan remains appropriate    Co-evaluation                 AM-PAC OT "6 Clicks" Daily Activity     Outcome Measure   Help from another person eating meals?: None Help from another person taking care of personal grooming?: A Little Help from another person toileting, which includes using toliet, bedpan, or urinal?: A Little Help from another person bathing (including washing, rinsing, drying)?: A Lot Help from another person to put on and taking off regular upper body clothing?: A Little Help from another person to put on and taking off regular lower body clothing?: A Lot 6 Click Score: 17    End of Session Equipment Utilized During Treatment: Rolling walker  OT Visit Diagnosis: Unsteadiness on feet (R26.81);Cognitive communication deficit (R41.841);Low vision, both eyes (H54.2);Other symptoms and signs involving cognitive function;Muscle weakness (generalized) (M62.81);Other abnormalities of gait and mobility (R26.89)   Activity Tolerance Patient tolerated treatment well   Patient Left in bed;with call bell/phone within reach;with bed alarm set;with nursing/sitter in room   Nurse Communication          Time: 7106-2694 OT Time Calculation (min): 16 min  Charges: OT General Charges $OT Visit: 1 Visit OT Treatments $Self Care/Home Management : 8-22 mins  02/07/2020  Rich, OTR/L  Acute Rehabilitation Services  Office:  [phone]    Kevin [name] 02/07/2020, 5:14 PM

## 2020-02-08 LAB — GLUCOSE, CAPILLARY
Glucose-Capillary: 113 mg/dL — ABNORMAL HIGH (ref 70–99)
Glucose-Capillary: 143 mg/dL — ABNORMAL HIGH (ref 70–99)
Glucose-Capillary: 111 mg/dL — ABNORMAL HIGH (ref 70–99)
Glucose-Capillary: 110 mg/dL — ABNORMAL HIGH (ref 70–99)

## 2020-02-08 MED ORDER — ADULT MULTIVITAMIN W/MINERALS CH
1.0000 | ORAL_TABLET | Freq: Every day | ORAL | Status: DC
  Administered 2020-02-08 – 2020-03-26 (×46): 1 via ORAL
  Filled 2020-02-08 (×47): qty 1

## 2020-02-08 NOTE — Progress Notes (Signed)
Initial Nutrition Assessment  DOCUMENTATION CODES:   Not applicable  INTERVENTION:   -Magic cup TID with meals, each supplement provides 290 kcal and 9 grams of protein -MVI with minerals daily  NUTRITION DIAGNOSIS:   Increased nutrient needs related to chronic illness (CHF) as evidenced by estimated needs. GOAL:   Patient will meet greater than or equal to 90% of their needs  MONITOR:   PO intake,Supplement acceptance,Labs,Weight trends,Skin,I & O's  REASON FOR ASSESSMENT:   LOS    ASSESSMENT:   Kevin [name] is a 55 y.o. male with medical history significant of systolic and diastolic heart failure, CKD, CVA, diabetes, hypertension, left eye blindness who presents after generalized decline at home.  Pt admitted with hyponatremia.   Reviewed I/O's: -130 ml x 24 hours and +4 L since 01/25/20  UOP: 850 ml x 24 hours  Pt unavailable at time of visit.   Per H&P, pt has experiencing poor oral intake PTA secondary to dysphagia. Pt currently with good appetite; noted meal completion 100%.   Reviewed wt hx; pt has experienced a 6.9% wt loss over the past 2 months, which is not significant for time frame.   Pt awaiting SNF placement.   Lab Results  Component Value Date   HGBA1C 5.8 (H) 01/05/2020   PTA DM medications are 1000 mg metformin BID.   Labs reviewed: CBGS: 111-186 (inpatient orders for glycemic control are 0-9 units insulin aspart TID with meals).   Diet Order:   Diet Order            Diet regular Room service appropriate? Yes with Assist; Fluid consistency: Thin  Diet effective now                 EDUCATION NEEDS:   No education needs have been identified at this time  Skin:  Skin Assessment: Reviewed RN Assessment  Last BM:  02/07/20  Height:   Ht Readings from Last 1 Encounters:  01/07/20 6\' 2"  (1.88 m)    Weight:   Wt Readings from Last 1 Encounters:  02/06/20 71.1 kg    Ideal Body Weight:  86.4 kg  BMI:  Body mass index is  20.13 kg/m.  Estimated Nutritional Needs:   Kcal:  2100-2300  Protein:  105-120 grams  Fluid:  > 2 L    02/08/20, RD, LDN, CDCES Registered Dietitian II Certified Diabetes Care and Education Specialist Please refer to [facility] for RD and/or RD on-call/weekend/after hours pager

## 2020-02-08 NOTE — Progress Notes (Signed)
PROGRESS NOTE  Kevin [name] [facility] HUT:[id number] DOB: [birth year] DOA: 01/23/2020 PCP: Medicine, [facility]  HPI/Recap of past 76 hours: 55 year old male with medical history of systolic and diastolic heart failure, CKD, CVA, diabetes mellitus type 2, hypertension, left eye blindness presented to the hospital after generalized weakness since December at home.  He was supposed to follow-up with home health at home but due to lack of insurance/acceptance of cash he was unable to get any help.  Patient also complained of decreased oral intake and cognitive deficits since December.  Patient was then admitted to the hospital for further evaluation and treatment.  Patient is waiting for SNF  Subjective:  February 08, 2020: Patient seen and examined at bedside he is more awake and alert he sitting at the side of bed eating lunch. Denies any complaints. Informed him we are still waiting for nursing home placement .  February 07, 2020: Patient seen and examined at bedside.  Denies any complaints February 06, 2020 Patient seen and examined at bedside he is lying quietly in bed he is answering yes or no and nodding of his head to questions.  He really remember that his mother was here yesterday.  Speech is and thinking is kind of slowed.  Still waiting for nursing home placement February 05, 2020: Patient seen and examined at bedside her his mother Kevin [name] is in the room.  Patient is noted to be eating much better today even though he is not making conversation but he is answering simple questions.  Still waiting for SNF February 04 2020 Patient seen and examined at bedside his affect is very flat Nurse reported that he refuses medication Does not want to talk much this morning he will answer yes or nodes head to questions he stated he did not eat he is not hungry.  February 03, 2020: Patient seen and examined at bedside his mother Kevin [name] at bedside.  Nurse had reported that patient got dressed  and wanted to go home he did not quite understand what was going on.  He called his mother who is at bedside now February 02, 2020: Patient patient seen and examined at bedside denies any new complain  Assessment/Plan: Principal Problem:   Hypernatremia Active Problems:   Diabetes mellitus without complication (HCC)   History of CVA (cerebrovascular accident)   Essential hypertension   CKD (chronic kidney disease), stage III (HCC)   Chronic CHF (congestive heart failure) (HCC)   Acute hypernatremia  #1 hyponatremia is resolved continue fluids and monitor  2. Dysphagia: Patient has been evaluated by speech therapy she has a mild aspiration risk and so is on dysphagia therapy  3. Acute kidney injury on chronic kidney disease stage IIIb is currently at baseline his creatinine today is 1. 15  4. Metabolic encephalopathy: He has improved overall his speech is still slightly slow MRI of the brain showed no acute abnormality  5. Generalized weakness/debility secondary to CVA physical therapy is working on him and they are recommending SNF transfer. He will continue statin and Eliquis. We will waiting for SNF transfer  6.  Uncontrolled hypertension.  Patient had refused his medicine occasionally particularly yesterday so his blood pressure is elevated we will continue his lisinopril 20 mg daily and monitor   Code Status: Full  Severity of Illness: The appropriate patient status for this patient is INPATIENT. Inpatient status is judged to be reasonable and necessary in order to provide the required intensity of service to ensure the patient's  safety. The patient's presenting symptoms, physical exam findings, and initial radiographic and laboratory data in the context of their chronic comorbidities is felt to place them at high risk for further clinical deterioration. Furthermore, it is not anticipated that the patient will be medically stable for discharge from the hospital within 2 midnights of  admission. The following factors support the patient status of inpatient.   " The patient's presenting symptoms include stroke. Stroke " The worrisome physical exam findings include stroke " The initial radiographic and laboratory data are worrisome because of stroke " The chronic co-morbidities include hyponatremia dysphagia metabolic encephalopathy generalized weakness.   * I certify that at the point of admission it is my clinical judgment that the patient will require inpatient hospital care spanning beyond 2 midnights from the point of admission due to high intensity of service, high risk for further deterioration and high frequency of surveillance required.*    Family Communication: Mother [name] at bedside  Disposition Plan: SNF   Consultants:  None  Procedures:  None  Antimicrobials:  None  DVT prophylaxis: Apixaban   Objective: Vitals:   02/07/20 1436 02/07/20 2103 02/08/20 0444 02/08/20 1457  BP: (!) 145/88 (!) 142/85 (!) 143/82 (!) 142/90  Pulse: 66 68 69 69  Resp: 18 18 18 18   Temp: 98.4 F (36.9 C) 98.2 F (36.8 C) 98.3 F (36.8 C) 98.1 F (36.7 C)  TempSrc: Oral Oral Oral Oral  SpO2: 99% 99% 99% 100%  Weight:        Intake/Output Summary (Last 24 hours) at 02/08/2020 1848 Last data filed at 02/08/2020 1130 Gross per 24 hour  Intake 360 ml  Output --  Net 360 ml   Filed Weights   01/29/20 0534 01/30/20 0500 02/06/20 0500  Weight: 71.9 kg 71.4 kg 71.1 kg   Body mass index is 20.13 kg/m.  Exam:  . General: 55 y.o. year-old male well developed well nourished in no acute distress.  Alert and oriented x3. . Cardiovascular: Regular rate and rhythm with no rubs or gallops.  No thyromegaly or JVD noted.   53 Respiratory: Clear to auscultation with no wheezes or rales. Good inspiratory effort. . Abdomen: Soft nontender nondistended with normal bowel sounds x4 quadrants. . Musculoskeletal: No lower extremity edema. 2/4 pulses in all 4  extremities. . Skin: No ulcerative lesions noted or rashes, . Psychiatry: Mood/affect is flat and setting speech is slightly improved. And slowed    Data Reviewed: CBC: No results for input(s): WBC, NEUTROABS, HGB, HCT, MCV, PLT in the last 168 hours. Basic Metabolic Panel: No results for input(s): NA, K, CL, CO2, GLUCOSE, BUN, CREATININE, CALCIUM, MG, PHOS in the last 168 hours. GFR: Estimated Creatinine Clearance: 73 mL/min (by C-G formula based on SCr of 1.15 mg/dL). Liver Function Tests: No results for input(s): AST, ALT, ALKPHOS, BILITOT, PROT, ALBUMIN in the last 168 hours. No results for input(s): LIPASE, AMYLASE in the last 168 hours. No results for input(s): AMMONIA in the last 168 hours. Coagulation Profile: No results for input(s): INR, PROTIME in the last 168 hours. Cardiac Enzymes: No results for input(s): CKTOTAL, CKMB, CKMBINDEX, TROPONINI in the last 168 hours. BNP (last 3 results) No results for input(s): PROBNP in the last 8760 hours. HbA1C: No results for input(s): HGBA1C in the last 72 hours. CBG: Recent Labs  Lab 02/07/20 1651 02/07/20 2105 02/08/20 0751 02/08/20 1159 02/08/20 1650  GLUCAP 84 186* 113* 143* 111*   Lipid Profile: No results for input(s): CHOL, HDL, LDLCALC,  TRIG, CHOLHDL, LDLDIRECT in the last 72 hours. Thyroid Function Tests: No results for input(s): TSH, T4TOTAL, FREET4, T3FREE, THYROIDAB in the last 72 hours. Anemia Panel: No results for input(s): VITAMINB12, FOLATE, FERRITIN, TIBC, IRON, RETICCTPCT in the last 72 hours. Urine analysis:    Component Value Date/Time   COLORURINE YELLOW 01/28/2020 0727   APPEARANCEUR HAZY (A) 01/28/2020 0727   LABSPEC 1.015 01/28/2020 0727   PHURINE 6.0 01/28/2020 0727   GLUCOSEU 50 (A) 01/28/2020 0727   HGBUR SMALL (A) 01/28/2020 0727   BILIRUBINUR NEGATIVE 01/28/2020 0727   KETONESUR 5 (A) 01/28/2020 0727   PROTEINUR NEGATIVE 01/28/2020 0727   NITRITE NEGATIVE 01/28/2020 0727   LEUKOCYTESUR  NEGATIVE 01/28/2020 0727   Sepsis Labs: @LABRCNTIP (procalcitonin:4,lacticidven:4)  ) No results found for this or any previous visit (from the past 240 hour(s)).    Studies: No results found.  Scheduled Meds: . amLODipine  10 mg Oral Daily  . apixaban  5 mg Oral BID  . atorvastatin  80 mg Oral Daily  . insulin aspart  0-9 Units Subcutaneous TID WC  . lisinopril  20 mg Oral Daily  . magnesium oxide  400 mg Oral BID  . multivitamin with minerals  1 tablet Oral Daily  . sodium chloride flush  3 mL Intravenous Q12H    Continuous Infusions:   LOS: 15 days     , MD [facility]  To reach me or the doctor on call, go to: [email or web address] Password [facility]  02/08/2020, 6:48 PM

## 2020-02-08 NOTE — Progress Notes (Signed)
Physical Therapy Treatment Patient Details Name: Kevin [name] MRN: [id number] DOB: 1965/11/18 Today's Date: 02/08/2020    History of Present Illness Pt is a 55 y.o. male admitted 01/23/20 with weakness and generalized decline at home; workup for metabolic encephalopathy, hyponatremia, AKI on CKD. 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   PT Comments    Pt progressing with mobility. Today's session focused on gait and balance training; pt requiring intermittent assist to prevent LOB with various tasks, including ambulation without challenge to balance. Pt remains limited by generalized weakness, impaired balance strategies/postural reactions, and cognitive impairment, including decreased attention, difficulty problem solving and poor awareness. Pt's mother [name]) present and supportive. Continue to recommend SNF-level therapies to maximize functional mobility and independence prior to return home.    Follow Up Recommendations  SNF;Supervision/Assistance - 24 hour     Equipment Recommendations  Rolling walker with 5" wheels (defer to SNF)    Recommendations for Other Services       Precautions / Restrictions Precautions Precautions: Fall;Other (comment) Precaution Comments: L eye blindness Restrictions Weight Bearing Restrictions: No    Mobility  Bed Mobility Overal bed mobility: Needs Assistance Bed Mobility: Supine to Sit     Supine to sit: Supervision        Transfers Overall transfer level: Needs assistance Equipment used: Rolling walker (2 wheeled);None Transfers: Sit to/from Stand Sit to Stand: Supervision            Ambulation/Gait Ambulation/Gait assistance: Min guard;Min assist Gait Distance (Feet): 100 Feet (+160) Assistive device: Rolling walker (2 wheeled);None Gait Pattern/deviations: Step-through pattern;Narrow base of support;Drifts right/left;Staggering right;Staggering  left Gait velocity: Decreased   General Gait Details: Slow, mildly unsteady gait initially with RW, pt with inconsistent foot placement, corrects instability; additional gait trial without DME, pt requires intermittent minA to correct LOB, especially with head turns and direction changes. Required max cues for getting back to room   Stairs             Wheelchair Mobility    Modified Rankin (Stroke Patients Only)       Balance Overall balance assessment: Needs assistance Sitting-balance support: No upper extremity supported;Feet supported Sitting balance-Leahy Scale: Good       Standing balance-Leahy Scale: Fair Standing balance comment: Can static stand without UE support               High Level Balance Comments: Able to maintain rhomberg stance with eyes opened and closed with min guard (due to cognition, pt not keeping eyes closed >5-10-sec); unable to maintain tandem stance with either leg in front without use of stepping strategy to prevent LOB; able to accept minimal external perturbations with static standing; (+) LOB with moderate perturbations to abdomen            Cognition Arousal/Alertness: Awake/alert Behavior During Therapy: Flat affect Overall Cognitive Status: History of cognitive impairments - at baseline Area of Impairment: Awareness;Following commands;Attention;Problem solving;Memory;Safety/judgement                   Current Attention Level: Sustained Memory: Decreased short-term memory Following Commands: Follows one step commands with increased time;Follows one step commands consistently Safety/Judgement: Decreased awareness of safety;Decreased awareness of deficits Awareness: Intellectual Problem Solving: Slow processing;Requires verbal cues General Comments: Pt's mom reports that prior to CVA in 12/2019, pt cognition WFL. Difficult to determine true extent of cognitive impairment due to flat affect and minimal verablizations  Exercises      General Comments General comments (skin integrity, edema, etc.): Pt's mother [name]) present and supportive      Pertinent Vitals/Pain Pain Assessment: No/denies pain Pain Intervention(s): Monitored during session    Home Living                      Prior Function            PT Goals (current goals can now be found in the care plan section) Acute Rehab PT Goals Patient Stated Goal: Rehab at SNF before return home PT Goal Formulation: With family Time For Goal Achievement: 02/22/20 Potential to Achieve Goals: Good Progress towards PT goals: Progressing toward goals    Frequency    Min 2X/week      PT Plan Current plan remains appropriate    Co-evaluation              AM-PAC PT "6 Clicks" Mobility   Outcome Measure  Help needed turning from your back to your side while in a flat bed without using bedrails?: None Help needed moving from lying on your back to sitting on the side of a flat bed without using bedrails?: A Little Help needed moving to and from a bed to a chair (including a wheelchair)?: A Little Help needed standing up from a chair using your arms (e.g., wheelchair or bedside chair)?: A Little Help needed to walk in hospital room?: A Little Help needed climbing 3-5 steps with a railing? : A Little 6 Click Score: 19    End of Session Equipment Utilized During Treatment: Gait belt Activity Tolerance: Patient tolerated treatment well Patient left: in chair;with call bell/phone within reach;with chair alarm set;with family/visitor present Nurse Communication: Mobility status PT Visit Diagnosis: Unsteadiness on feet (R26.81);Muscle weakness (generalized) (M62.81)     Time: 1610-9604 PT Time Calculation (min) (ACUTE ONLY): 26 min  Charges:  $Gait Training: 8-22 mins $Neuromuscular Re-education: 8-22 mins                     Ina Homes, PT, DPT Acute Rehabilitation Services  Pager [phone] Office  [phone]  [name] 02/08/2020, 4:01 PM

## 2020-02-09 LAB — GLUCOSE, CAPILLARY
Glucose-Capillary: 104 mg/dL — ABNORMAL HIGH (ref 70–99)
Glucose-Capillary: 85 mg/dL (ref 70–99)
Glucose-Capillary: 131 mg/dL — ABNORMAL HIGH (ref 70–99)
Glucose-Capillary: 136 mg/dL — ABNORMAL HIGH (ref 70–99)

## 2020-02-09 LAB — BASIC METABOLIC PANEL
Anion gap: 8 (ref 5–15)
BUN: 17 mg/dL (ref 6–20)
CO2: 27 mmol/L (ref 22–32)
Calcium: 9.1 mg/dL (ref 8.9–10.3)
Chloride: 101 mmol/L (ref 98–111)
Creatinine, Ser: 1.04 mg/dL (ref 0.61–1.24)
GFR, Estimated: >60 mL/min (ref >60)
Glucose, Bld: 119 mg/dL — ABNORMAL HIGH (ref 70–99)
Potassium: 3.7 mmol/L (ref 3.5–5.1)
Sodium: 136 mmol/L (ref 135–145)

## 2020-02-09 LAB — CBC
HCT: 35.6 % — ABNORMAL LOW (ref 39.0–52.0)
Hemoglobin: 11.3 g/dL — ABNORMAL LOW (ref 13.0–17.0)
MCH: 28.6 pg (ref 26.0–34.0)
MCHC: 31.7 g/dL (ref 30.0–36.0)
MCV: 90.1 fL (ref 80.0–100.0)
Platelets: 121 10*3/uL — ABNORMAL LOW (ref 150–400)
RBC: 3.95 MIL/uL — ABNORMAL LOW (ref 4.22–5.81)
RDW: 11.9 % (ref 11.5–15.5)
WBC: 3.2 10*3/uL — ABNORMAL LOW (ref 4.0–10.5)
nRBC: 0 % (ref 0.0–0.2)

## 2020-02-09 NOTE — Progress Notes (Signed)
Occupational Therapy Evaluation Patient Details Name: Kevin [name] MRN: [id number] DOB: [birth year] Today's Date: 02/09/2020    History of Present Illness Pt is a 55 y.o. male admitted 01/23/20 with weakness and generalized decline at home; workup for metabolic encephalopathy, hyponatremia, AKI on CKD. 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   Clinical Impression   Pt at this time presented in bed and took increase in time to arousal at this time. Pt completed supine to sitting with increase time and moderate cues,completed oral care post set up, sitting to standing with min guard and max cues with use or RW. Pt took side steps with RW but did not want to complete any further ambulation at this time. Pt went from standing to sitting to supine with supervision.  Pt will benefit from skilled OT to increase their safety and independence with ADL and functional mobility for ADL to facilitate discharge to venue listed below.       Follow Up Recommendations  Supervision/Assistance - 24 hour;SNF    Equipment Recommendations  Other (comment)    Recommendations for Other Services       Precautions / Restrictions Precautions Precautions: Fall Precaution Comments: L eye blindness      Mobility Bed Mobility Overal bed mobility: Needs Assistance Bed Mobility: Supine to Sit     Supine to sit: Supervision Sit to supine: Supervision   General bed mobility comments: cues on hand position    Transfers Overall transfer level: Needs assistance Equipment used: Rolling walker (2 wheeled);None Transfers: Sit to/from Stand Sit to Stand: Supervision Stand pivot transfers: Min guard       General transfer comment: cues for hand position and due to decrease in saftey awareness    Balance Overall balance assessment: Needs assistance Sitting-balance support: No upper extremity supported;Feet supported Sitting balance-Leahy  Scale: Good     Standing balance support: Bilateral upper extremity supported Standing balance-Leahy Scale: Fair Standing balance comment: Can static stand without UE support                           ADL either performed or assessed with clinical judgement   ADL Overall ADL's : Needs assistance/impaired Eating/Feeding: Supervision/ safety;Sitting   Grooming: Wash/dry face;Set up;Bed level;Oral care;Sitting   Upper Body Bathing: Min guard;Sitting               Toilet Transfer: RW;Ambulation;Comfort height toilet;Supervision/safety Toilet Transfer Details (indicate cue type and reason): sit to stnad transfers at EOB to simlate as pt did not want to complete         Functional mobility during ADLs: Rolling walker;Supervision/safety General ADL Comments: pt needs increase in time to process and motor plan     Vision         Perception     Praxis      Pertinent Vitals/Pain Pain Assessment: No/denies pain     Hand Dominance     Extremity/Trunk Assessment Upper Extremity Assessment Upper Extremity Assessment: Overall WFL for tasks assessed   Lower Extremity Assessment Lower Extremity Assessment: Defer to PT evaluation       Communication     Cognition Arousal/Alertness:  (increase time to arouse) Behavior During Therapy: Flat affect Overall Cognitive Status: History of cognitive impairments - at baseline Area of Impairment: Awareness;Following commands;Attention;Problem solving;Memory;Safety/judgement                 Orientation Level: Person (  asked but would not answer but yes no at this time regarding going to bed) Current Attention Level: Sustained Memory: Decreased short-term memory Following Commands: Follows one step commands with increased time;Follows one step commands consistently Safety/Judgement: Decreased awareness of safety;Decreased awareness of deficits Awareness: Intellectual Problem Solving: Slow processing;Requires  verbal cues     General Comments       Exercises     Shoulder Instructions      Home Living                                          Prior Functioning/Environment                   OT Problem List:        OT Treatment/Interventions:      OT Goals(Current goals can be found in the care plan section) Acute Rehab OT Goals Patient Stated Goal: Rehab at SNF before return home OT Goal Formulation: With patient Time For Goal Achievement: 02/22/20 Potential to Achieve Goals: Fair ADL Goals Pt Will Perform Grooming: with modified independence;standing Pt Will Perform Upper Body Bathing: with set-up;sitting Pt Will Perform Lower Body Bathing: with supervision;sit to/from stand Pt Will Perform Upper Body Dressing: with set-up;sitting Pt Will Perform Lower Body Dressing: with supervision;sit to/from stand Pt Will Transfer to Toilet: with modified independence;ambulating;regular height toilet Pt Will Perform Toileting - Clothing Manipulation and hygiene: with supervision;sit to/from stand Additional ADL Goal #1: Pt will sustain attention to simple self care activities x 5 mins with min cues.  OT Frequency: Min 2X/week   Barriers to D/C:            Co-evaluation              AM-PAC OT "6 Clicks" Daily Activity     Outcome Measure Help from another person eating meals?: None Help from another person taking care of personal grooming?: A Little Help from another person toileting, which includes using toliet, bedpan, or urinal?: A Little Help from another person bathing (including washing, rinsing, drying)?: A Lot Help from another person to put on and taking off regular upper body clothing?: A Little Help from another person to put on and taking off regular lower body clothing?: A Lot 6 Click Score: 17   End of Session Equipment Utilized During Treatment: Rolling walker Nurse Communication: Other (comment) (limited activity in session)  Activity  Tolerance: Other (comment) (pt said yes when asked if tired) Patient left: in bed;with call bell/phone within reach;with bed alarm set;with nursing/sitter in room  OT Visit Diagnosis: Unsteadiness on feet (R26.81);Cognitive communication deficit (R41.841);Low vision, both eyes (H54.2);Other symptoms and signs involving cognitive function;Muscle weakness (generalized) (M62.81);Other abnormalities of gait and mobility (R26.89) Symptoms and signs involving cognitive functions: Cerebral infarction                Time: 4098-1191 OT Time Calculation (min): 32 min Charges:  OT General Charges $OT Visit: 1 Visit OT Treatments $Self Care/Home Management : 23-37 mins  Alphia Moh OTR/L  Acute Rehab Services  [phone] office number [phone] pager number   Alphia Moh 02/09/2020, 11:00 AM

## 2020-02-09 NOTE — Progress Notes (Addendum)
PROGRESS NOTE  Kevin [name] [facility] MBT:[id number] DOB: [birth year] DOA: 01/23/2020 PCP: Medicine, [facility]  HPI/Recap of past 34 hours: 55 year old male with medical history of systolic and diastolic heart failure, CKD, CVA, diabetes mellitus type 2, hypertension, left eye blindness presented to the hospital after generalized weakness since December at home.  He was supposed to follow-up with home health at home but due to lack of insurance/acceptance of cash he was unable to get any help.  Patient also complained of decreased oral intake and cognitive deficits since December.  Patient was then admitted to the hospital for further evaluation and treatment.  Patient is waiting for SNF  Subjective: 2020-02-09 Seen and examined at bedside he is sitting up in the side of the bed eating breakfast he denies any new complaints he said he did not sleep well but declined offer of a sleeping aid for tonight  February 08, 2020: Patient seen and examined at bedside he is more awake and alert he sitting at the side of bed eating lunch. Denies any complaints. Informed him we are still waiting for nursing home placement .  February 07, 2020: Patient seen and examined at bedside.  Denies any complaints February 06, 2020 Patient seen and examined at bedside he is lying quietly in bed he is answering yes or no and nodding of his head to questions.  He really remember that his mother was here yesterday.  Speech is and thinking is kind of slowed.  Still waiting for nursing home placement February 05, 2020: Patient seen and examined at bedside her his mother Kevin [name] is in the room.  Patient is noted to be eating much better today even though he is not making conversation but he is answering simple questions.  Still waiting for SNF February 04 2020 Patient seen and examined at bedside his affect is very flat Nurse reported that he refuses medication Does not want to talk much this morning he will answer  yes or nodes head to questions he stated he did not eat he is not hungry.  February 03, 2020: Patient seen and examined at bedside his mother Kevin [name] at bedside.  Nurse had reported that patient got dressed and wanted to go home he did not quite understand what was going on.  He called his mother who is at bedside now February 02, 2020: Patient patient seen and examined at bedside denies any new complain  Assessment/Plan: Principal Problem:   Hypernatremia Active Problems:   Diabetes mellitus without complication (HCC)   History of CVA (cerebrovascular accident)   Essential hypertension   CKD (chronic kidney disease), stage III (HCC)   Chronic CHF (congestive heart failure) (HCC)   Acute hypernatremia  #1 hyponatremia is resolved continue fluids and monitor  2. Dysphagia: Patient has been evaluated by speech therapy she has a mild aspiration risk and so is on dysphagia therapy  3. Acute kidney injury on chronic kidney disease stage IIIb is currently at baseline his creatinine today is 1. 15  4. Metabolic encephalopathy: He has improved overall his speech is still slightly slow MRI of the brain showed no acute abnormality  5. Generalized weakness/debility secondary to CVA physical therapy is working on him and they are recommending SNF transfer. He will continue statin and Eliquis. We will waiting for SNF transfer  6.  Uncontrolled hypertension.  his blood pressure is elevated .we will continue his lisinopril 20 mg daily and monitor his blood pressure /Bp is elevated today   Code  Status: Full  Severity of Illness: The appropriate patient status for this patient is INPATIENT. Inpatient status is judged to be reasonable and necessary in order to provide the required intensity of service to ensure the patient's safety. The patient's presenting symptoms, physical exam findings, and initial radiographic and laboratory data in the context of their chronic comorbidities is felt to place them at  high risk for further clinical deterioration. Furthermore, it is not anticipated that the patient will be medically stable for discharge from the hospital within 2 midnights of admission. The following factors support the patient status of inpatient.   " The patient's presenting symptoms include stroke. Stroke " The worrisome physical exam findings include stroke " The initial radiographic and laboratory data are worrisome because of stroke " The chronic co-morbidities include hyponatremia dysphagia metabolic encephalopathy generalized weakness.   * I certify that at the point of admission it is my clinical judgment that the patient will require inpatient hospital care spanning beyond 2 midnights from the point of admission due to high intensity of service, high risk for further deterioration and high frequency of surveillance required.*    Family Communication: Mother Kevin [name] at bedside  Disposition Plan: SNF   Consultants:  None  Procedures:  None  Antimicrobials:  None  DVT prophylaxis: Apixaban   Objective: Vitals:   02/08/20 0444 02/08/20 1457 02/08/20 2022 02/09/20 0607  BP: (!) 143/82 (!) 142/90 (!) 171/101 (!) 193/87  Pulse: 69 69 (!) 53 (!) 50  Resp: 18 18 18 18   Temp: 98.3 F (36.8 C) 98.1 F (36.7 C) 99 F (37.2 C) 97.8 F (36.6 C)  TempSrc: Oral Oral Oral Oral  SpO2: 99% 100% 99% 100%  Weight:        Intake/Output Summary (Last 24 hours) at 02/09/2020 1534 Last data filed at 02/09/2020 1241 Gross per 24 hour  Intake 400 ml  Output 400 ml  Net 0 ml   Filed Weights   01/29/20 0534 01/30/20 0500 02/06/20 0500  Weight: 71.9 kg 71.4 kg 71.1 kg   Body mass index is 20.13 kg/m.  Exam:  . General: 55 y.o. year-old male well developed well nourished in no acute distress.  Alert and oriented x3. . Cardiovascular: Regular rate and rhythm with no rubs or gallops.  No thyromegaly or JVD noted.   53 Respiratory: Clear to auscultation with no wheezes or  rales. Good inspiratory effort. . Abdomen: Soft nontender nondistended with normal bowel sounds x4 quadrants. . Musculoskeletal: No lower extremity edema. 2/4 pulses in all 4 extremities. . Skin: No ulcerative lesions noted or rashes, . Psychiatry: Mood/affect is flat and setting speech is slightly improved. And slowed    Data Reviewed: CBC: Recent Labs  Lab 02/09/20 0143  WBC 3.2*  HGB 11.3*  HCT 35.6*  MCV 90.1  PLT 121*   Basic Metabolic Panel: Recent Labs  Lab 02/09/20 0143  NA 136  K 3.7  CL 101  CO2 27  GLUCOSE 119*  BUN 17  CREATININE 1.04  CALCIUM 9.1   GFR: Estimated Creatinine Clearance: 80.7 mL/min (by C-G formula based on SCr of 1.04 mg/dL). Liver Function Tests: No results for input(s): AST, ALT, ALKPHOS, BILITOT, PROT, ALBUMIN in the last 168 hours. No results for input(s): LIPASE, AMYLASE in the last 168 hours. No results for input(s): AMMONIA in the last 168 hours. Coagulation Profile: No results for input(s): INR, PROTIME in the last 168 hours. Cardiac Enzymes: No results for input(s): CKTOTAL, CKMB, CKMBINDEX, TROPONINI in the  last 168 hours. BNP (last 3 results) No results for input(s): PROBNP in the last 8760 hours. HbA1C: No results for input(s): HGBA1C in the last 72 hours. CBG: Recent Labs  Lab 02/08/20 1159 02/08/20 1650 02/08/20 2025 02/09/20 0753 02/09/20 1134  GLUCAP 143* 111* 110* 104* 85   Lipid Profile: No results for input(s): CHOL, HDL, LDLCALC, TRIG, CHOLHDL, LDLDIRECT in the last 72 hours. Thyroid Function Tests: No results for input(s): TSH, T4TOTAL, FREET4, T3FREE, THYROIDAB in the last 72 hours. Anemia Panel: No results for input(s): VITAMINB12, FOLATE, FERRITIN, TIBC, IRON, RETICCTPCT in the last 72 hours. Urine analysis:    Component Value Date/Time   COLORURINE YELLOW 01/28/2020 0727   APPEARANCEUR HAZY (A) 01/28/2020 0727   LABSPEC 1.015 01/28/2020 0727   PHURINE 6.0 01/28/2020 0727   GLUCOSEU 50 (A)  01/28/2020 0727   HGBUR SMALL (A) 01/28/2020 0727   BILIRUBINUR NEGATIVE 01/28/2020 0727   KETONESUR 5 (A) 01/28/2020 0727   PROTEINUR NEGATIVE 01/28/2020 0727   NITRITE NEGATIVE 01/28/2020 0727   LEUKOCYTESUR NEGATIVE 01/28/2020 0727   Sepsis Labs: @LABRCNTIP (procalcitonin:4,lacticidven:4)  ) No results found for this or any previous visit (from the past 240 hour(s)).    Studies: No results found.  Scheduled Meds: . amLODipine  10 mg Oral Daily  . apixaban  5 mg Oral BID  . atorvastatin  80 mg Oral Daily  . insulin aspart  0-9 Units Subcutaneous TID WC  . lisinopril  20 mg Oral Daily  . magnesium oxide  400 mg Oral BID  . multivitamin with minerals  1 tablet Oral Daily  . sodium chloride flush  3 mL Intravenous Q12H    Continuous Infusions:   LOS: 16 days     , MD [facility]  To reach me or the doctor on call, go to: [email or web address] Password [facility]  02/09/2020, 3:34 PM

## 2020-02-10 LAB — GLUCOSE, CAPILLARY
Glucose-Capillary: 90 mg/dL (ref 70–99)
Glucose-Capillary: 135 mg/dL — ABNORMAL HIGH (ref 70–99)
Glucose-Capillary: 148 mg/dL — ABNORMAL HIGH (ref 70–99)
Glucose-Capillary: 99 mg/dL (ref 70–99)

## 2020-02-10 NOTE — Progress Notes (Signed)
PROGRESS NOTE  Jayel Inks [facility] CBJ:[id number] DOB: 09/06/1965 DOA: 01/23/2020 PCP: Medicine, [facility]  HPI/Recap of past 16 hours: 55 year old male with medical history of systolic and diastolic heart failure, CKD, CVA, diabetes mellitus type 2, hypertension, left eye blindness presented to the hospital after generalized weakness since December at home.  He was supposed to follow-up with home health at home but due to lack of insurance/acceptance of cash he was unable to get any help.  Patient also complained of decreased oral intake and cognitive deficits since December.  Patient was then admitted to the hospital for further evaluation and treatment.  Patient is waiting for SNF  Subjective:  February 10, 2020: Patient seen and examined at bedside he is pleasant denies any complaints Still waiting on SNF due to insurance problem February 09, 2020  Seen and examined at bedside he is sitting up in the side of the bed eating breakfast he denies any new complaints he said he did not sleep well but declined offer of a sleeping aid for tonight  February 08, 2020: Patient seen and examined at bedside he is more awake and alert he sitting at the side of bed eating lunch. Denies any complaints. Informed him we are still waiting for nursing home placement .  February 07, 2020: Patient seen and examined at bedside.  Denies any complaints February 06, 2020 Patient seen and examined at bedside he is lying quietly in bed he is answering yes or no and nodding of his head to questions.  He really remember that his mother was here yesterday.  Speech is and thinking is kind of slowed.  Still waiting for nursing home placement February 05, 2020: Patient seen and examined at bedside her his mother [name] is in the room.  Patient is noted to be eating much better today even though he is not making conversation but he is answering simple questions.  Still waiting for SNF February 04 2020 Patient  seen and examined at bedside his affect is very flat Nurse reported that he refuses medication Does not want to talk much this morning he will answer yes or nodes head to questions he stated he did not eat he is not hungry.  February 03, 2020: Patient seen and examined at bedside his mother [name] at bedside.  Nurse had reported that patient got dressed and wanted to go home he did not quite understand what was going on.  He called his mother who is at bedside now February 02, 2020: Patient patient seen and examined at bedside denies any new complain  Assessment/Plan: Principal Problem:   Hypernatremia Active Problems:   Diabetes mellitus without complication (HCC)   History of CVA (cerebrovascular accident)   Essential hypertension   CKD (chronic kidney disease), stage III (HCC)   Chronic CHF (congestive heart failure) (HCC)   Acute hypernatremia  #1 hyponatremia is resolved continue fluids and monitor  2. Dysphagia: Patient has been evaluated by speech therapy she has a mild aspiration risk and so is on dysphagia therapy  3. Acute kidney injury on chronic kidney disease stage IIIb is currently at baseline his creatinine today is 1. 15  4. Metabolic encephalopathy: He has improved overall his speech is still slightly slow MRI of the brain showed no acute abnormality  5. Generalized weakness/debility secondary to CVA physical therapy is working on him and they are recommending SNF transfer. He will continue statin and Eliquis. We will waiting for SNF transfer  6.  Uncontrolled  hypertension.  his blood pressure is elevated .we will continue his lisinopril 20 mg daily and monitor his blood pressure /Bp is elevated today   Code Status: Full  Severity of Illness: The appropriate patient status for this patient is INPATIENT. Inpatient status is judged to be reasonable and necessary in order to provide the required intensity of service to ensure the patient's safety. The patient's  presenting symptoms, physical exam findings, and initial radiographic and laboratory data in the context of their chronic comorbidities is felt to place them at high risk for further clinical deterioration. Furthermore, it is not anticipated that the patient will be medically stable for discharge from the hospital within 2 midnights of admission. The following factors support the patient status of inpatient.   " The patient's presenting symptoms include stroke. Stroke " The worrisome physical exam findings include stroke " The initial radiographic and laboratory data are worrisome because of stroke " The chronic co-morbidities include hyponatremia dysphagia metabolic encephalopathy generalized weakness.   * I certify that at the point of admission it is my clinical judgment that the patient will require inpatient hospital care spanning beyond 2 midnights from the point of admission due to high intensity of service, high risk for further deterioration and high frequency of surveillance required.*    Family Communication: Mother [name] at bedside  Disposition Plan: SNF   Consultants:  None  Procedures:  None  Antimicrobials:  None  DVT prophylaxis: Apixaban   Objective: Vitals:   02/10/20 0414 02/10/20 0421 02/10/20 0941 02/10/20 1355  BP: (!) 155/93  (!) 150/78 139/74  Pulse: (!) 57   (!) 55  Resp: 18   17  Temp: 98.2 F (36.8 C)  98 F (36.7 C) 99.1 F (37.3 C)  TempSrc: Oral   Oral  SpO2: 100%   100%  Weight:  69.5 kg      Intake/Output Summary (Last 24 hours) at 02/10/2020 1442 Last data filed at 02/10/2020 0800 Gross per 24 hour  Intake 830 ml  Output --  Net 830 ml   Filed Weights   01/30/20 0500 02/06/20 0500 02/10/20 0421  Weight: 71.4 kg 71.1 kg 69.5 kg   Body mass index is 19.67 kg/m.  Exam:  . General: 55 y.o. year-old male well developed well nourished in no acute distress.  Alert and oriented x3. . Cardiovascular: Regular rate and rhythm with no  rubs or gallops.  No thyromegaly or JVD noted.   Marland Kitchen Respiratory: Clear to auscultation with no wheezes or rales. Good inspiratory effort. . Abdomen: Soft nontender nondistended with normal bowel sounds x4 quadrants. . Musculoskeletal: No lower extremity edema. 2/4 pulses in all 4 extremities. . Skin: No ulcerative lesions noted or rashes, . Psychiatry: Mood/affect is flat and setting speech is slightly improved. And slowed    Data Reviewed: CBC: Recent Labs  Lab 02/09/20 0143  WBC 3.2*  HGB 11.3*  HCT 35.6*  MCV 90.1  PLT 121*   Basic Metabolic Panel: Recent Labs  Lab 02/09/20 0143  NA 136  K 3.7  CL 101  CO2 27  GLUCOSE 119*  BUN 17  CREATININE 1.04  CALCIUM 9.1   GFR: Estimated Creatinine Clearance: 78.9 mL/min (by C-G formula based on SCr of 1.04 mg/dL). Liver Function Tests: No results for input(s): AST, ALT, ALKPHOS, BILITOT, PROT, ALBUMIN in the last 168 hours. No results for input(s): LIPASE, AMYLASE in the last 168 hours. No results for input(s): AMMONIA in the last 168 hours. Coagulation Profile: No  results for input(s): INR, PROTIME in the last 168 hours. Cardiac Enzymes: No results for input(s): CKTOTAL, CKMB, CKMBINDEX, TROPONINI in the last 168 hours. BNP (last 3 results) No results for input(s): PROBNP in the last 8760 hours. HbA1C: No results for input(s): HGBA1C in the last 72 hours. CBG: Recent Labs  Lab 02/09/20 1134 02/09/20 1643 02/09/20 2147 02/10/20 0805 02/10/20 1157  GLUCAP 85 131* 136* 90 135*   Lipid Profile: No results for input(s): CHOL, HDL, LDLCALC, TRIG, CHOLHDL, LDLDIRECT in the last 72 hours. Thyroid Function Tests: No results for input(s): TSH, T4TOTAL, FREET4, T3FREE, THYROIDAB in the last 72 hours. Anemia Panel: No results for input(s): VITAMINB12, FOLATE, FERRITIN, TIBC, IRON, RETICCTPCT in the last 72 hours. Urine analysis:    Component Value Date/Time   COLORURINE YELLOW 01/28/2020 0727   APPEARANCEUR HAZY (A)  01/28/2020 0727   LABSPEC 1.015 01/28/2020 0727   PHURINE 6.0 01/28/2020 0727   GLUCOSEU 50 (A) 01/28/2020 0727   HGBUR SMALL (A) 01/28/2020 0727   BILIRUBINUR NEGATIVE 01/28/2020 0727   KETONESUR 5 (A) 01/28/2020 0727   PROTEINUR NEGATIVE 01/28/2020 0727   NITRITE NEGATIVE 01/28/2020 0727   LEUKOCYTESUR NEGATIVE 01/28/2020 0727   Sepsis Labs: @LABRCNTIP (procalcitonin:4,lacticidven:4)  ) No results found for this or any previous visit (from the past 240 hour(s)).    Studies: No results found.  Scheduled Meds: . amLODipine  10 mg Oral Daily  . apixaban  5 mg Oral BID  . atorvastatin  80 mg Oral Daily  . insulin aspart  0-9 Units Subcutaneous TID WC  . lisinopril  20 mg Oral Daily  . magnesium oxide  400 mg Oral BID  . multivitamin with minerals  1 tablet Oral Daily  . sodium chloride flush  3 mL Intravenous Q12H    Continuous Infusions:   LOS: 17 days     , MD [facility]  To reach me or the doctor on call, go to: [email or web address] Password [email or web address]  02/10/2020, 2:42 PM

## 2020-02-11 DIAGNOSIS — I5042 Chronic combined systolic (congestive) and diastolic (congestive) heart failure: Secondary | ICD-10-CM

## 2020-02-11 LAB — GLUCOSE, CAPILLARY
Glucose-Capillary: 98 mg/dL (ref 70–99)
Glucose-Capillary: 228 mg/dL — ABNORMAL HIGH (ref 70–99)
Glucose-Capillary: 135 mg/dL — ABNORMAL HIGH (ref 70–99)
Glucose-Capillary: 166 mg/dL — ABNORMAL HIGH (ref 70–99)

## 2020-02-11 NOTE — TOC Progression Note (Signed)
Transition of Care [facility]) - Progression Note    Patient Details  Name: Kevin [name] MRN: [id number] Date of Birth: 03-08-1965  Transition of Care [facility]) CM/SW Contact  Jimmy Picket, [place] Phone Number: 02/11/2020, 12:52 PM  Clinical Narrative:     Pt has no bed offers at this time.   CSW reached out to [facility] Behavioral social worker at [employer] and no answer.   CSW reached out to pts mother [name], there was no answer csw left message requesting call back.   Expected Discharge Plan: Home w Home Health Services    Expected Discharge Plan and Services Expected Discharge Plan: Home w Home Health Services   Discharge Planning Services: CM Consult Post Acute Care Choice: Home Health,Durable Medical Equipment Living arrangements for the past 2 months: Single Family Home                           HH Arranged: OT,PT HH Agency: [facility] Date [facility] Agency Contacted: 01/28/20 Time HH Agency Contacted: 1425 Representative spoke with at [facility] Agency: Kandee Keen   Social Determinants of Health (SDOH) Interventions    Readmission Risk Interventions Readmission Risk Prevention Plan 01/08/2020  Transportation Screening Complete  PCP or Specialist Appt within 5-7 Days Complete  Home Care Screening Complete  Medication Review (RN CM) Complete  Some recent data might be hidden   Jimmy Picket, Theresia Majors, [facility] Clinical Social Worker [phone]

## 2020-02-11 NOTE — Progress Notes (Signed)
PROGRESS NOTE  Kevin [name] [facility]  VXB:[id number] DOB: [birth year] DOA: 01/23/2020 PCP: [facility], [facility]   Brief Narrative: Kevin [name] is a 55 y.o. male with a history of chronic combined HFrEF, stage III CKD, CVA, left eye blindness, T2DM, HTN who presented to the ED 1/12 with progressive weakness and increasing cognitive decline over the past month. Work up has shown no infectious etiology to acute metabolic encephalopathy, and neuroimaging has shown no stroke. He remains severely debilitated, so SNF disposition is pursued, complicated by uninsured status.   Assessment & Plan: Principal Problem:   Hypernatremia Active Problems:   Diabetes mellitus without complication (HCC)   History of CVA (cerebrovascular accident)   Essential hypertension   CKD (chronic kidney disease), stage III (HCC)   Chronic CHF (congestive heart failure) (HCC)   Acute hypernatremia  Hypernatremia: Due to dehydration, likely altered mentation impaired thirst response leading to free water deficit. This has since been resolved.   Acute metabolic encephalopathy: Unclear whether he's returned to his baseline, though he's improved with IV rehydration, so likely due to volume depletion. Work up for reversible etiologies has been negative to date.  - Delirium precautions. Could not reach pt's NOK by phone today.   Asymptomatic bacteriuria: No symptoms, UA negative, though Enterobacter grew in culture.  - No treatment indicated. Remains afebrile.  AKI on stage IIIb CKD: Improved.  - Avoid nephrotoxins - Can add back thiazide if indicated with improved renal function, though BP is controlled  History of CVA December 2021, HTN: Was discharged with home health though was unable to pay for these services and agency wouldn't accept cash. Residual right-sided weakness and dysphagia.  - Continue amlodipine and lisinopril - Eliquis (due to low LVEF), statin  History of chronic combined HFrEF: Not on  diuretics, appearing euvolemic at this time. Last TEE 01/07/2020 with LVEF 20-25%. - Monitor volume status.  - Per neurology, can switch back to antiplatelet in lieu of eliquis once LVEF >30-35%. - Continue ACE inhibitor - Unable to start beta blocker due to bradycardia - No indication for diuretic at this time.  - Would likely benefit from spironolactone. Appears to have been discussed with cardiology at last hospitalization where cath was deferred due to poor adherence. Will recommend cardiology outpatient follow up.   T2DM: Recent HbA1c 5.8% indicating adequate control.  - Continue SSI - Holding home metformin  Hypokalemia: Resolved.   Pancytopenia: Unclear etiology - Recheck CBC w/diff in AM   DVT prophylaxis: Eliquis Code Status: Full Family Communication: None at bedside Disposition Plan:  Status is: Inpatient  Remains inpatient appropriate because:Unsafe d/c plan  Dispo: The patient is from: Home              Anticipated d/c is to: SNF              Anticipated d/c date is: > 3 days              Patient currently is medically stable to d/c.   Difficult to place patient Yes  Consultants:   None  Procedures:   None  Antimicrobials:  None   Subjective: Pt confused but in no pain, no dyspnea, chest pain, abd pain. Eating well.   Objective: Vitals:   02/10/20 1355 02/10/20 2050 02/11/20 0233 02/11/20 0400  BP: 139/74 132/80  (!) 151/88  Pulse: (!) 55 (!) 50  71  Resp: 17 16  16   Temp: 99.1 F (37.3 C) 97.8 F (36.6 C)  98  F (36.7 C)  TempSrc: Oral Oral  Oral  SpO2: 100% 100%  100%  Weight:   70.4 kg     Intake/Output Summary (Last 24 hours) at 02/11/2020 1851 Last data filed at 02/11/2020 1651 Gross per 24 hour  Intake 940 ml  Output 2050 ml  Net -1110 ml   Filed Weights   02/06/20 0500 02/10/20 0421 02/11/20 0233  Weight: 71.1 kg 69.5 kg 70.4 kg    Gen: 55 y.o. male appearing older than stated age in no distress Pulm: Non-labored breathing.  Clear to auscultation bilaterally.  CV: Regular rate and rhythm. No murmur, rub, or gallop. No JVD, no pedal edema. GI: Abdomen soft, non-tender, non-distended, with normoactive bowel sounds. No organomegaly or masses felt. Ext: Warm, no deformities Skin: No rashes, lesions or ulcers Neuro: Alert and incompletely oriented. No focal neurological deficits. Psych: Judgement and insight appear impaired. Mood & affect appropriate.   Data Reviewed: I have personally reviewed following labs and imaging studies  CBC: Recent Labs  Lab 02/09/20 0143  WBC 3.2*  HGB 11.3*  HCT 35.6*  MCV 90.1  PLT 121*   Basic Metabolic Panel: Recent Labs  Lab 02/09/20 0143  NA 136  K 3.7  CL 101  CO2 27  GLUCOSE 119*  BUN 17  CREATININE 1.04  CALCIUM 9.1   GFR: Estimated Creatinine Clearance: 79.9 mL/min (by C-G formula based on SCr of 1.04 mg/dL). Liver Function Tests: No results for input(s): AST, ALT, ALKPHOS, BILITOT, PROT, ALBUMIN in the last 168 hours. No results for input(s): LIPASE, AMYLASE in the last 168 hours. No results for input(s): AMMONIA in the last 168 hours. Coagulation Profile: No results for input(s): INR, PROTIME in the last 168 hours. Cardiac Enzymes: No results for input(s): CKTOTAL, CKMB, CKMBINDEX, TROPONINI in the last 168 hours. BNP (last 3 results) No results for input(s): PROBNP in the last 8760 hours. HbA1C: No results for input(s): HGBA1C in the last 72 hours. CBG: Recent Labs  Lab 02/10/20 1641 02/10/20 2050 02/11/20 0734 02/11/20 1201 02/11/20 1831  GLUCAP 148* 99 98 228* 135*   Lipid Profile: No results for input(s): CHOL, HDL, LDLCALC, TRIG, CHOLHDL, LDLDIRECT in the last 72 hours. Thyroid Function Tests: No results for input(s): TSH, T4TOTAL, FREET4, T3FREE, THYROIDAB in the last 72 hours. Anemia Panel: No results for input(s): VITAMINB12, FOLATE, FERRITIN, TIBC, IRON, RETICCTPCT in the last 72 hours. Urine analysis:    Component Value  Date/Time   COLORURINE YELLOW 01/28/2020 0727   APPEARANCEUR HAZY (A) 01/28/2020 0727   LABSPEC 1.015 01/28/2020 0727   PHURINE 6.0 01/28/2020 0727   GLUCOSEU 50 (A) 01/28/2020 0727   HGBUR SMALL (A) 01/28/2020 0727   BILIRUBINUR NEGATIVE 01/28/2020 0727   KETONESUR 5 (A) 01/28/2020 0727   PROTEINUR NEGATIVE 01/28/2020 0727   NITRITE NEGATIVE 01/28/2020 0727   LEUKOCYTESUR NEGATIVE 01/28/2020 0727   No results found for this or any previous visit (from the past 240 hour(s)).    Radiology Studies: No results found.  Scheduled Meds: . amLODipine  10 mg Oral Daily  . apixaban  5 mg Oral BID  . atorvastatin  80 mg Oral Daily  . insulin aspart  0-9 Units Subcutaneous TID WC  . lisinopril  20 mg Oral Daily  . magnesium oxide  400 mg Oral BID  . multivitamin with minerals  1 tablet Oral Daily  . sodium chloride flush  3 mL Intravenous Q12H   Continuous Infusions:   LOS: 18 days   Time  spent: 25 minutes.  Tyrone Nine, MD [facility] [email or web address] 02/11/2020, 6:51 PM

## 2020-02-12 LAB — BASIC METABOLIC PANEL
Anion gap: 9 (ref 5–15)
BUN: 23 mg/dL — ABNORMAL HIGH (ref 6–20)
CO2: 28 mmol/L (ref 22–32)
Calcium: 9 mg/dL (ref 8.9–10.3)
Chloride: 101 mmol/L (ref 98–111)
Creatinine, Ser: 1.14 mg/dL (ref 0.61–1.24)
GFR, Estimated: >60 mL/min (ref >60)
Glucose, Bld: 115 mg/dL — ABNORMAL HIGH (ref 70–99)
Potassium: 3.8 mmol/L (ref 3.5–5.1)
Sodium: 138 mmol/L (ref 135–145)

## 2020-02-12 LAB — CBC WITH DIFFERENTIAL/PLATELET
Abs Immature Granulocytes: 0.01 10*3/uL (ref 0.00–0.07)
Basophils Absolute: 0 10*3/uL (ref 0.0–0.1)
Basophils Relative: 1 %
Eosinophils Absolute: 0.2 10*3/uL (ref 0.0–0.5)
Eosinophils Relative: 4 %
HCT: 32.1 % — ABNORMAL LOW (ref 39.0–52.0)
Hemoglobin: 10.5 g/dL — ABNORMAL LOW (ref 13.0–17.0)
Immature Granulocytes: 0 %
Lymphocytes Relative: 39 %
Lymphs Abs: 1.5 10*3/uL (ref 0.7–4.0)
MCH: 29 pg (ref 26.0–34.0)
MCHC: 32.7 g/dL (ref 30.0–36.0)
MCV: 88.7 fL (ref 80.0–100.0)
Monocytes Absolute: 0.4 10*3/uL (ref 0.1–1.0)
Monocytes Relative: 10 %
Neutro Abs: 1.8 10*3/uL (ref 1.7–7.7)
Neutrophils Relative %: 46 %
Platelets: 134 10*3/uL — ABNORMAL LOW (ref 150–400)
RBC: 3.62 MIL/uL — ABNORMAL LOW (ref 4.22–5.81)
RDW: 11.9 % (ref 11.5–15.5)
WBC: 3.9 10*3/uL — ABNORMAL LOW (ref 4.0–10.5)
nRBC: 0 % (ref 0.0–0.2)

## 2020-02-12 LAB — GLUCOSE, CAPILLARY
Glucose-Capillary: 102 mg/dL — ABNORMAL HIGH (ref 70–99)
Glucose-Capillary: 140 mg/dL — ABNORMAL HIGH (ref 70–99)
Glucose-Capillary: 163 mg/dL — ABNORMAL HIGH (ref 70–99)
Glucose-Capillary: 157 mg/dL — ABNORMAL HIGH (ref 70–99)

## 2020-02-12 MED ORDER — HYDROCHLOROTHIAZIDE 25 MG PO TABS
25.0000 mg | ORAL_TABLET | Freq: Every day | ORAL | Status: DC
  Administered 2020-02-12 – 2020-03-26 (×42): 25 mg via ORAL
  Filled 2020-02-12 (×44): qty 1

## 2020-02-12 NOTE — TOC Progression Note (Signed)
Transition of Care [facility]) - Progression Note    Patient Details  Name: DEVIAN BARTOLOMEI MRN: [id number] Date of Birth: 13-Oct-1965  Transition of Care [facility]) CM/SW Contact  Jimmy Picket, [place] Phone Number: 02/12/2020, 10:34 AM  Clinical Narrative:     CSW spoke to Ms Patterson Hammersmith, DSS medicaid social worker. Ms Patterson Hammersmith stated pts medicaid and disability are pending.   TOC to follow  Expected Discharge Plan: Home w Home Health Services    Expected Discharge Plan and Services Expected Discharge Plan: Home w Home Health Services   Discharge Planning Services: CM Consult Post Acute Care Choice: Home Health,Durable Medical Equipment Living arrangements for the past 2 months: Single Family Home                           HH Arranged: OT,PT HH Agency: [facility] Date [facility] Agency Contacted: 01/28/20 Time HH Agency Contacted: 1425 Representative spoke with at [facility] Agency: Kandee Keen   Social Determinants of Health (SDOH) Interventions    Readmission Risk Interventions Readmission Risk Prevention Plan 01/08/2020  Transportation Screening Complete  PCP or Specialist Appt within 5-7 Days Complete  Home Care Screening Complete  Medication Review (RN CM) Complete  Some recent data might be hidden   Jimmy Picket, Theresia Majors, [facility] Clinical Social Worker [phone]

## 2020-02-12 NOTE — Progress Notes (Signed)
Physical Therapy Treatment Patient Details Name: Kevin [name] MRN: [id number] DOB: 1965/03/03 Today's Date: 02/12/2020    History of Present Illness Pt is a 55 y.o. male admitted 01/23/20 with weakness and generalized decline at home; workup for metabolic encephalopathy, hyponatremia, AKI on CKD. 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   PT Comments    Pt progressing with mobility. Prefers ambulation with RW, which does improve his stability. Pt remains limited by apparent cognitive impairments, including decreased awareness, impaired problem solving, poor attention, and higher level balance deficits. Family not able to provide 24/7 supervision at d/c, therefore continue to recommend SNF-level therapies to maximize functional mobility and independence.   Follow Up Recommendations  SNF;Supervision/Assistance - 24 hour     Equipment Recommendations  Rolling walker with 5" wheels    Recommendations for Other Services       Precautions / Restrictions Precautions Precautions: Fall;Other (comment) Precaution Comments: L eye blindness Restrictions Weight Bearing Restrictions: No    Mobility  Bed Mobility               General bed mobility comments: Received sitting EOB  Transfers Overall transfer level: Needs assistance Equipment used: Rolling walker (2 wheeled);None Transfers: Sit to/from Stand Sit to Stand: Supervision         General transfer comment: Pt requesting use of RW but standing without it initially; supervision for safety with and without DME  Ambulation/Gait Ambulation/Gait assistance: Min guard Gait Distance (Feet): 200 Feet Assistive device: Rolling walker (2 wheeled) Gait Pattern/deviations: Step-through pattern;Narrow base of support;Drifts right/left Gait velocity: Decreased   General Gait Details: Slow, intermittently unsteady gait with RW and intermittent min guard for balance;  cues for direction as pt unable to recall room number. Self-corrected instability 2x with head turns and direction changes   Stairs             Wheelchair Mobility    Modified Rankin (Stroke Patients Only)       Balance Overall balance assessment: Needs assistance Sitting-balance support: No upper extremity supported;Feet supported Sitting balance-Leahy Scale: Good     Standing balance support: Bilateral upper extremity supported;No upper extremity supported Standing balance-Leahy Scale: Fair Standing balance comment: Can static stand without UE support                            Cognition Arousal/Alertness: Awake/alert Behavior During Therapy: Flat affect Overall Cognitive Status: Impaired/Different from baseline Area of Impairment: Awareness;Following commands;Attention;Problem solving;Memory;Safety/judgement                   Current Attention Level: Sustained Memory: Decreased short-term memory Following Commands: Follows one step commands with increased time;Follows one step commands consistently Safety/Judgement: Decreased awareness of safety;Decreased awareness of deficits Awareness: Intellectual Problem Solving: Slow processing;Requires verbal cues General Comments: Pt's mom reports that prior to CVA in 12/2019, pt cognition WFL. Difficult to determine true extent of cognitive impairment due to flat affect and minimal verablizations, although pt more interactive and laughing at a couple jokes this session. Still with difficulty processing; pt needing to pee, educated on wearing condom cath and can pee, but pt still 'holding it' and does not seem to understand the catheter      Exercises      General Comments        Pertinent Vitals/Pain Pain Assessment: No/denies pain Pain Intervention(s): Monitored during session    Home Living  Prior Function            PT Goals (current goals can now be found in  the care plan section) Progress towards PT goals: Progressing toward goals    Frequency    Min 2X/week      PT Plan Current plan remains appropriate    Co-evaluation              AM-PAC PT "6 Clicks" Mobility   Outcome Measure  Help needed turning from your back to your side while in a flat bed without using bedrails?: None Help needed moving from lying on your back to sitting on the side of a flat bed without using bedrails?: None Help needed moving to and from a bed to a chair (including a wheelchair)?: A Little Help needed standing up from a chair using your arms (e.g., wheelchair or bedside chair)?: A Little Help needed to walk in hospital room?: A Little Help needed climbing 3-5 steps with a railing? : A Little 6 Click Score: 20    End of Session Equipment Utilized During Treatment: Gait belt Activity Tolerance: Patient tolerated treatment well Patient left: in chair;with call bell/phone within reach;with chair alarm set Nurse Communication: Mobility status PT Visit Diagnosis: Unsteadiness on feet (R26.81);Muscle weakness (generalized) (M62.81)     Time: 8185-6314 PT Time Calculation (min) (ACUTE ONLY): 18 min  Charges:  $Gait Training: 8-22 mins                     Ina Homes, PT, DPT Acute Rehabilitation Services  Pager [phone] Office [phone]  [name] 02/12/2020, 9:13 AM

## 2020-02-12 NOTE — Progress Notes (Signed)
PROGRESS NOTE  Kevin [name]  PRF:[id number] DOB: May 29, 1965 DOA: 01/23/2020 PCP: Medicine, [facility]   Brief Narrative: Kevin [name] is a 55 y.o. male with a history of chronic combined HFrEF, stage III CKD, CVA, left eye blindness, T2DM, HTN who presented to the ED 1/12 with progressive weakness and increasing cognitive decline over the past month. Work up has shown no infectious etiology to acute metabolic encephalopathy, and neuroimaging has shown no stroke. He remains severely debilitated, so SNF disposition is pursued, complicated by uninsured status.   Assessment & Plan: Principal Problem:   Hypernatremia Active Problems:   Diabetes mellitus without complication (HCC)   History of CVA (cerebrovascular accident)   Essential hypertension   CKD (chronic kidney disease), stage II (HCC)   Chronic combined CHF (congestive heart failure) (HCC)   Acute hypernatremia  Hypernatremia: Due to dehydration, likely altered mentation impaired thirst response leading to free water deficit. This has since been resolved without need for IV fluids.  Acute metabolic encephalopathy: Unclear whether he's returned to his baseline, though he's improved with IV rehydration, so likely due to volume depletion. Work up for reversible etiologies has been negative to date.  - Delirium precautions.    Asymptomatic bacteriuria: No symptoms, UA negative, though Enterobacter grew in culture.  - No treatment indicated. Remains afebrile.  AKI on stage II CKD: Improved. Note CrCl now >60 ml/min once returned to baseline. - Avoid nephrotoxins - Add back thiazide since BP elevated and renal function stable.  History of CVA December 2021, HTN: Was discharged with home health though was unable to pay for these services and agency wouldn't accept cash. Residual right-sided weakness and dysphagia.  - Continue amlodipine and lisinopril, restart HCTZ. - Eliquis (due to low LVEF), statin  History of  chronic combined HFrEF: Not on diuretics, appearing euvolemic at this time. Last TEE 01/07/2020 with LVEF 20-25%. - Monitor volume status. Remains euvolemic. - Per neurology, can switch back to antiplatelet in lieu of eliquis once LVEF >30-35%. - Continue ACE inhibitor - Unable to start beta blocker due to bradycardia - No indication for diuretic at this time.  - Would likely benefit from spironolactone. Appears to have been discussed with cardiology at last hospitalization where cath was deferred due to poor adherence. Will recommend cardiology outpatient follow up.   T2DM: Recent HbA1c 5.8% indicating adequate control.  - Continue SSI - Holding home metformin  Hypokalemia: Resolved.   Pancytopenia: Unclear etiology, remains stable, not severe, on recheck.  - Recheck CBC intermittently and pursue outpatient follow up if persists.   DVT prophylaxis: Eliquis Code Status: Full Family Communication: None at bedside Disposition Plan:  Status is: Inpatient  Remains inpatient appropriate because:Unsafe d/c plan  Dispo: The patient is from: Home              Anticipated d/c is to: SNF              Anticipated d/c date is: > 3 days              Patient currently is medically stable to d/c.   Difficult to place patient Yes  Consultants:   None  Procedures:   None  Antimicrobials:  None   Subjective: Pt confused, got up to bedside recliner with assistance this AM. Has no awareness other than himself essentially. Spit pills out but followed directions to take them with his coffee. No complaints.   Objective: Vitals:   02/11/20 0400 02/11/20 2051 02/12/20 0356  02/12/20 0357  BP: (!) 151/88 (!) 157/85  (!) 167/87  Pulse: 71 65  (!) 55  Resp: 16 16  17   Temp: 98 F (36.7 C) 98.4 F (36.9 C)  98.1 F (36.7 C)  TempSrc: Oral Oral  Oral  SpO2: 100% 99%  100%  Weight:   70 kg     Intake/Output Summary (Last 24 hours) at 02/12/2020 1107 Last data filed at 02/12/2020 0900 Gross  per 24 hour  Intake 1218 ml  Output 2700 ml  Net -1482 ml   Filed Weights   02/10/20 0421 02/11/20 0233 02/12/20 0356  Weight: 69.5 kg 70.4 kg 70 kg   Gen: 55 y.o. male in no distress Pulm: Nonlabored breathing room air. Clear. CV: Regular rate and rhythm. No murmur, rub, or gallop. No JVD, no dependent edema. GI: Abdomen soft, non-tender, non-distended, with normoactive bowel sounds.  Ext: Warm, no deformities Skin: No rashes, lesions or ulcers on visualized skin. Neuro: Alert and oriented only x1. No focal neurological deficits on exam limited by ability to persistently follow directions. Psych: Judgement and insight appear impaired. Mood euthymic & affect congruent.    Data Reviewed: I have personally reviewed following labs and imaging studies  CBC: Recent Labs  Lab 02/09/20 0143 02/12/20 0059  WBC 3.2* 3.9*  NEUTROABS  --  1.8  HGB 11.3* 10.5*  HCT 35.6* 32.1*  MCV 90.1 88.7  PLT 121* 134*   Basic Metabolic Panel: Recent Labs  Lab 02/09/20 0143 02/12/20 0059  NA 136 138  K 3.7 3.8  CL 101 101  CO2 27 28  GLUCOSE 119* 115*  BUN 17 23*  CREATININE 1.04 1.14  CALCIUM 9.1 9.0   GFR: Estimated Creatinine Clearance: 72.5 mL/min (by C-G formula based on SCr of 1.14 mg/dL). Liver Function Tests: No results for input(s): AST, ALT, ALKPHOS, BILITOT, PROT, ALBUMIN in the last 168 hours. No results for input(s): LIPASE, AMYLASE in the last 168 hours. No results for input(s): AMMONIA in the last 168 hours. Coagulation Profile: No results for input(s): INR, PROTIME in the last 168 hours. Cardiac Enzymes: No results for input(s): CKTOTAL, CKMB, CKMBINDEX, TROPONINI in the last 168 hours. BNP (last 3 results) No results for input(s): PROBNP in the last 8760 hours. HbA1C: No results for input(s): HGBA1C in the last 72 hours. CBG: Recent Labs  Lab 02/11/20 0734 02/11/20 1201 02/11/20 1831 02/11/20 2049 02/12/20 0757  GLUCAP 98 228* 135* 166* 102*   Lipid  Profile: No results for input(s): CHOL, HDL, LDLCALC, TRIG, CHOLHDL, LDLDIRECT in the last 72 hours. Thyroid Function Tests: No results for input(s): TSH, T4TOTAL, FREET4, T3FREE, THYROIDAB in the last 72 hours. Anemia Panel: No results for input(s): VITAMINB12, FOLATE, FERRITIN, TIBC, IRON, RETICCTPCT in the last 72 hours. Urine analysis:    Component Value Date/Time   COLORURINE YELLOW 01/28/2020 0727   APPEARANCEUR HAZY (A) 01/28/2020 0727   LABSPEC 1.015 01/28/2020 0727   PHURINE 6.0 01/28/2020 0727   GLUCOSEU 50 (A) 01/28/2020 0727   HGBUR SMALL (A) 01/28/2020 0727   BILIRUBINUR NEGATIVE 01/28/2020 0727   KETONESUR 5 (A) 01/28/2020 0727   PROTEINUR NEGATIVE 01/28/2020 0727   NITRITE NEGATIVE 01/28/2020 0727   LEUKOCYTESUR NEGATIVE 01/28/2020 0727    Scheduled Meds: . amLODipine  10 mg Oral Daily  . apixaban  5 mg Oral BID  . atorvastatin  80 mg Oral Daily  . insulin aspart  0-9 Units Subcutaneous TID WC  . lisinopril  20 mg Oral Daily  .  magnesium oxide  400 mg Oral BID  . multivitamin with minerals  1 tablet Oral Daily  . sodium chloride flush  3 mL Intravenous Q12H   Continuous Infusions:   LOS: 19 days   Time spent: 25 minutes.  Tyrone Nine, MD [facility] [email or web address] 02/12/2020, 11:07 AM

## 2020-02-13 LAB — GLUCOSE, CAPILLARY
Glucose-Capillary: 120 mg/dL — ABNORMAL HIGH (ref 70–99)
Glucose-Capillary: 141 mg/dL — ABNORMAL HIGH (ref 70–99)
Glucose-Capillary: 130 mg/dL — ABNORMAL HIGH (ref 70–99)
Glucose-Capillary: 134 mg/dL — ABNORMAL HIGH (ref 70–99)

## 2020-02-13 NOTE — Progress Notes (Signed)
Nutrition Follow-up  DOCUMENTATION CODES:   Not applicable  INTERVENTION:   -Continue MVI with minerals daily -Continue Magic cup TID with meals, each supplement provides 290 kcal and 9 grams of protein -RD to sign off secondary to medical stability; if further nutrition-related needs arise, please re-consult RD  NUTRITION DIAGNOSIS:   Increased nutrient needs related to chronic illness (CHF) as evidenced by estimated needs.  Ongoing  GOAL:   Patient will meet greater than or equal to 90% of their needs  Progressing   MONITOR:   PO intake,Supplement acceptance,Labs,Weight trends,Skin,I & O's  REASON FOR ASSESSMENT:   LOS    ASSESSMENT:   Kevin [name] is a 55 y.o. male with medical history significant of systolic and diastolic heart failure, CKD, CVA, diabetes, hypertension, left eye blindness who presents after generalized decline at home.  Reviewed I/O's: +73 ml x 24 hours and +1.4 L since 01/30/20  UOP: 950 ml x 24 hours  Spoke with pt at bedside, who reports feeling well today. He reports good appetite; consuming spaghetti lunch without difficulty. Pt shares he has been trying to consume most of the food off his trays.   Per pt, he had a good appetite PTA. He shares he was consuming 3 meals per day.   Pt denies any weight loss. He shares he has always had a slender build.   Labs reviewed: CBGS: 120-163 (inpatient orders for glycemic control are 0-9 units insulin aspart TID with meals).   NUTRITION - FOCUSED PHYSICAL EXAM:  Flowsheet Row Most Recent Value  Orbital Region No depletion  Upper Arm Region No depletion  Thoracic and Lumbar Region No depletion  Buccal Region No depletion  Temple Region No depletion  Clavicle Bone Region Mild depletion  Clavicle and Acromion Bone Region Mild depletion  Scapular Bone Region Mild depletion  Dorsal Hand No depletion  Patellar Region No depletion  Anterior Thigh Region No depletion  Posterior Calf Region No  depletion  Edema (RD Assessment) None  Hair Reviewed  Eyes Reviewed  Mouth Reviewed  Skin Reviewed  Nails Reviewed       Diet Order:   Diet Order            Diet regular Room service appropriate? Yes with Assist; Fluid consistency: Thin  Diet effective now                 EDUCATION NEEDS:   No education needs have been identified at this time  Skin:  Skin Assessment: Reviewed RN Assessment  Last BM:  02/07/20  Height:   Ht Readings from Last 1 Encounters:  01/07/20 6\' 2"  (1.88 m)    Weight:   Wt Readings from Last 1 Encounters:  02/12/20 70 kg    Ideal Body Weight:  86.4 kg  BMI:  Body mass index is 19.81 kg/m.  Estimated Nutritional Needs:   Kcal:  2100-2300  Protein:  105-120 grams  Fluid:  > 2 L    04/11/20, RD, LDN, CDCES Registered Dietitian II Certified Diabetes Care and Education Specialist Please refer to [facility] for RD and/or RD on-call/weekend/after hours pager

## 2020-02-13 NOTE — Care Management (Addendum)
NCM left Revonda Standard with [facility] a message regarding potential bed offer. Awaiting call back.    Revonda Standard returned call, she cannot take a pending disability . She cannot offer a SNF bed.  No bed offers.  Called Inetta Fermo at [facility] left message.    Olegario Messier at [facility] not accepting LOG at this time.   Shazma at [facility] : all admissions are on hold.   Olegario Messier at [facility] not accepting LOG's at this time.   Tanya at [facility] left a message.   Kim with [facility] cannot accept LOG if disability has not already been approved.  [facility] unable to accept without current disability.  TOC leadership involved.   NCM spoke to patient's mother this am also.    Ronny Flurry RN

## 2020-02-13 NOTE — Progress Notes (Signed)
Occupational Therapy Treatment Patient Details Name: Kevin [name] MRN: [id number] DOB: 1965/02/16 Today's Date: 02/13/2020    History of present illness Pt is a 55 y.o. male admitted 01/23/20 with weakness and generalized decline at home; workup for metabolic encephalopathy, hyponatremia, AKI on CKD. 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   OT comments  Pt making good progress with functional goals, however continues to be limited by cognitive impairments, including decreased safety awareness/awareness of deficits, impaired problem solving, poor attention, and higher level balance deficits for ADL mobility tasks. Family not able to provide 24/7 supervision at d/c, therefore continue to recommend 24 hour sup settings. OT will continue to follow acutely to maximize level of function and safety  Follow Up Recommendations  Supervision/Assistance - 24 hour (ALF vs SNF(LTC) vs Group home)    Equipment Recommendations  None recommended by OT    Recommendations for Other Services      Precautions / Restrictions Precautions Precautions: Fall;Other (comment) Precaution Comments: L eye blindness Restrictions Weight Bearing Restrictions: No       Mobility Bed Mobility               General bed mobility comments: pt in recliner upon arrival  Transfers Overall transfer level: Needs assistance Equipment used: Rolling walker (2 wheeled);None Transfers: Sit to/from Stand Sit to Stand: Supervision         General transfer comment: supervision for safety with and without DME    Balance Overall balance assessment: Needs assistance Sitting-balance support: No upper extremity supported;Feet supported Sitting balance-Leahy Scale: Good     Standing balance support: Bilateral upper extremity supported;No upper extremity supported Standing balance-Leahy Scale: Fair                             ADL either  performed or assessed with clinical judgement   ADL Overall ADL's : Needs assistance/impaired Eating/Feeding: Sitting;Independent   Grooming: Wash/dry face;Oral care;Wash/dry hands;Supervision/safety;Standing   Upper Body Bathing: Supervision/ safety;Set up;Sitting   Lower Body Bathing: Min guard;Sit to/from stand   Upper Body Dressing : Set up;Supervision/safety;Standing   Lower Body Dressing: Min guard;Sit to/from stand   Toilet Transfer: Ambulation;Comfort height toilet;Supervision/safety   Toileting- Clothing Manipulation and Hygiene: Sit to/from stand;Supervision/safety       Functional mobility during ADLs: Rolling walker General ADL Comments: pt needs increased time to process and motor plan     Vision Baseline Vision/History: Retinopathy Patient Visual Report: No change from baseline     Perception     Praxis      Cognition Arousal/Alertness: Awake/alert Behavior During Therapy: Flat affect Overall Cognitive Status: Impaired/Different from baseline Area of Impairment: Awareness;Following commands;Attention;Problem solving;Memory;Safety/judgement                     Memory: Decreased short-term memory Following Commands: Follows one step commands with increased time;Follows one step commands consistently Safety/Judgement: Decreased awareness of safety;Decreased awareness of deficits   Problem Solving: Slow processing;Requires verbal cues General Comments: Pt instructed to unzip jacket and doff, however pt unzipped jacket and zipped back up x 2 and did not doff. When pt asked by therapist what the instructions were he replied " take off my jacket", pt then proceeded to doff jacket        Exercises     Shoulder Instructions       General Comments      Pertinent  Vitals/ Pain       Pain Assessment: No/denies pain Faces Pain Scale: No hurt Pain Intervention(s): Monitored during session  Home Living                                           Prior Functioning/Environment              Frequency  Min 2X/week        Progress Toward Goals  OT Goals(current goals can now be found in the care plan section)  Progress towards OT goals: Progressing toward goals     Plan Discharge plan remains appropriate    Co-evaluation                 AM-PAC OT "6 Clicks" Daily Activity     Outcome Measure   Help from another person eating meals?: None Help from another person taking care of personal grooming?: None Help from another person toileting, which includes using toliet, bedpan, or urinal?: None Help from another person bathing (including washing, rinsing, drying)?: A Little Help from another person to put on and taking off regular upper body clothing?: None Help from another person to put on and taking off regular lower body clothing?: A Little 6 Click Score: 22    End of Session    OT Visit Diagnosis: Unsteadiness on feet (R26.81);Cognitive communication deficit (R41.841);Low vision, both eyes (H54.2);Other symptoms and signs involving cognitive function;Muscle weakness (generalized) (M62.81);Other abnormalities of gait and mobility (R26.89) Symptoms and signs involving cognitive functions: Cerebral infarction   Activity Tolerance Patient tolerated treatment well   Patient Left with call bell/phone within reach;with chair alarm set;in chair   Nurse Communication Mobility status;Other (comment) (ADL status)        Time: 8588-5027 OT Time Calculation (min): 23 min  Charges: OT General Charges $OT Visit: 1 Visit OT Treatments $Self Care/Home Management : 8-22 mins $Therapeutic Activity: 8-22 mins     Galen Manila 02/13/2020, 1:55 PM

## 2020-02-13 NOTE — Progress Notes (Signed)
PROGRESS NOTE  Kevin [name] [facility]  BMW:[id number] DOB: Jan 14, 1965 DOA: 01/23/2020 PCP: Medicine, [facility]   Brief Narrative: Kevin [name] is a 55 y.o. male with a history of chronic combined HFrEF, stage III CKD, CVA, left eye blindness, T2DM, HTN who presented to the ED 1/12 with progressive weakness and increasing cognitive decline over the past month. Work up has shown no infectious etiology to acute metabolic encephalopathy, and neuroimaging has shown no stroke. He remains severely debilitated, so SNF disposition is pursued, complicated by uninsured status.   Assessment & Plan: Principal Problem:   Hypernatremia Active Problems:   Diabetes mellitus without complication (HCC)   History of CVA (cerebrovascular accident)   Essential hypertension   CKD (chronic kidney disease), stage II (HCC)   Chronic combined CHF (congestive heart failure) (HCC)   Acute hypernatremia  Hypernatremia: Due to dehydration, likely altered mentation impaired thirst response leading to free water deficit. This has since been resolved without need for IV fluids.  Acute metabolic encephalopathy: Unclear whether he's returned to his baseline, though he's improved with IV rehydration, so likely due to volume depletion. Work up for reversible etiologies has been negative to date.  - Delirium precautions.    Asymptomatic bacteriuria: No symptoms, UA negative, though Enterobacter grew in culture.  - No treatment indicated. Remains afebrile.  AKI on stage II CKD: Improved. Note CrCl now >60 ml/min once returned to baseline. - Avoid nephrotoxins - Add back thiazide since BP elevated and renal function stable.  History of CVA December 2021, HTN: Was discharged with home health though was unable to pay for these services and agency wouldn't accept cash. Residual right-sided weakness and dysphagia.  - Continue amlodipine and lisinopril, restart HCTZ. - Eliquis (due to low LVEF), statin - Continue  PT/OT efforts.  History of chronic combined HFrEF: Not on diuretics, appearing euvolemic at this time. Last TEE 01/07/2020 with LVEF 20-25%. - Monitor volume status. Remains euvolemic. - Per neurology, can switch back to antiplatelet in lieu of eliquis once LVEF >30-35%. - Continue ACE inhibitor - Unable to start beta blocker due to bradycardia - No indication for diuretic at this time.  - Would likely benefit from spironolactone. Appears to have been discussed with cardiology at last hospitalization where cath was deferred due to poor adherence. Will recommend cardiology outpatient follow up and repeat echo.   T2DM: Recent HbA1c 5.8% indicating adequate control.  - Continue SSI - Holding home metformin  Hypokalemia: Resolved.   Pancytopenia: Unclear etiology, remains stable, not severe, on recheck.  - Recheck CBC intermittently and pursue outpatient follow up if persists.   DVT prophylaxis: Eliquis Code Status: Full Family Communication: None at bedside Disposition Plan:  Status is: Inpatient  Remains inpatient appropriate because:Unsafe d/c plan  Dispo: The patient is from: Home              Anticipated d/c is to: SNF once medicaid and disability are approved. Hospital CSW in consistent communication with DSS medicaid CSW.              Anticipated d/c date is: > 3 days              Patient currently is medically stable to d/c.   Difficult to place patient Yes  Consultants:   None  Procedures:   None  Antimicrobials:  None   Subjective: Up to recliner with PT this morning, has no complaints. Denies pain, dyspnea.   Objective: Vitals:   02/12/20 1336 02/12/20  2055 02/13/20 0507 02/13/20 0603  BP: (!) 155/82 (!) 141/72 (!) 185/99 (!) 146/80  Pulse: (!) 57 (!) 50 (!) 55 (!) 51  Resp: 18 17 16 18   Temp: (!) 97.2 F (36.2 C) 98.2 F (36.8 C) 97.7 F (36.5 C) 98 F (36.7 C)  TempSrc:  Oral Oral   SpO2: 99% 100% 100% 100%  Weight:        Intake/Output Summary  (Last 24 hours) at 02/13/2020 1055 Last data filed at 02/13/2020 04/12/2020 Gross per 24 hour  Intake 743 ml  Output 600 ml  Net 143 ml   Filed Weights   02/10/20 0421 02/11/20 0233 02/12/20 0356  Weight: 69.5 kg 70.4 kg 70 kg   Gen: 55 y.o. male in no distress Pulm: Nonlabored breathing room air. Clear. CV: Regular rate and rhythm. No murmur, rub, or gallop. No JVD, no dependent edema. GI: Abdomen soft, non-tender, non-distended, with normoactive bowel sounds.  Ext: Warm, no deformities Skin: No new rashes, lesions or ulcers on visualized skin. Neuro: Alert and disoriented, follows most commands. Psych: Judgement and insight appear impaired. Mood euthymic & affect congruent. Behavior is appropriate.    Data Reviewed: I have personally reviewed following labs and imaging studies  CBC: Recent Labs  Lab 02/09/20 0143 02/12/20 0059  WBC 3.2* 3.9*  NEUTROABS  --  1.8  HGB 11.3* 10.5*  HCT 35.6* 32.1*  MCV 90.1 88.7  PLT 121* 134*   Basic Metabolic Panel: Recent Labs  Lab 02/09/20 0143 02/12/20 0059  NA 136 138  K 3.7 3.8  CL 101 101  CO2 27 28  GLUCOSE 119* 115*  BUN 17 23*  CREATININE 1.04 1.14  CALCIUM 9.1 9.0   GFR: Estimated Creatinine Clearance: 72.5 mL/min (by C-G formula based on SCr of 1.14 mg/dL).  CBG: Recent Labs  Lab 02/12/20 0757 02/12/20 1156 02/12/20 1744 02/12/20 2048 02/13/20 0804  GLUCAP 102* 140* 163* 157* 120*   Urine analysis:    Component Value Date/Time   COLORURINE YELLOW 01/28/2020 0727   APPEARANCEUR HAZY (A) 01/28/2020 0727   LABSPEC 1.015 01/28/2020 0727   PHURINE 6.0 01/28/2020 0727   GLUCOSEU 50 (A) 01/28/2020 0727   HGBUR SMALL (A) 01/28/2020 0727   BILIRUBINUR NEGATIVE 01/28/2020 0727   KETONESUR 5 (A) 01/28/2020 0727   PROTEINUR NEGATIVE 01/28/2020 0727   NITRITE NEGATIVE 01/28/2020 0727   LEUKOCYTESUR NEGATIVE 01/28/2020 0727    Scheduled Meds: . amLODipine  10 mg Oral Daily  . apixaban  5 mg Oral BID  . atorvastatin   80 mg Oral Daily  . hydrochlorothiazide  25 mg Oral Daily  . insulin aspart  0-9 Units Subcutaneous TID WC  . lisinopril  20 mg Oral Daily  . magnesium oxide  400 mg Oral BID  . multivitamin with minerals  1 tablet Oral Daily  . sodium chloride flush  3 mL Intravenous Q12H   Continuous Infusions:   LOS: 20 days   Time spent: 25 minutes.  01/30/2020, MD [facility] [email or web address] 02/13/2020, 10:55 AM

## 2020-02-13 NOTE — Progress Notes (Signed)
   02/13/20 1510  Clinical Encounter Type  Visited With Health care provider  Visit Type Initial  Referral From Nurse  Consult/Referral To Chaplain   Chaplain responded to consult for AD. Chaplain spoke with Pt's nurse, Inetta Fermo. Due to Pt's cognitive impairments, we are unable to complete an AD. Chaplain remains available for further questions.  This note was prepared by Chaplain Resident, Tacy Learn, MDiv. Chaplain remains available as needed through the on-call pager: [phone].

## 2020-02-14 LAB — GLUCOSE, CAPILLARY
Glucose-Capillary: 186 mg/dL — ABNORMAL HIGH (ref 70–99)
Glucose-Capillary: 141 mg/dL — ABNORMAL HIGH (ref 70–99)
Glucose-Capillary: 82 mg/dL (ref 70–99)
Glucose-Capillary: 153 mg/dL — ABNORMAL HIGH (ref 70–99)

## 2020-02-14 NOTE — Progress Notes (Signed)
Physical Therapy Treatment Patient Details Name: Kevin [name] MRN: [id number] DOB: [birth year] Today's Date: 02/14/2020    History of Present Illness Pt is a 55 y.o. male admitted 01/23/20 with weakness and generalized decline at home; workup for metabolic encephalopathy, hyponatremia, AKI on CKD. 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   PT Comments    Pt progressing well with mobility. Remains limited by cognition and higher level balance impairments; continues to benefit from supervision for mobility due to fall risk and decreased awareness. Will continue to follow acutely to address established goals.    Follow Up Recommendations  SNF;Supervision for mobility/OOB     Equipment Recommendations  Rolling walker with 5" wheels    Recommendations for Other Services       Precautions / Restrictions Precautions Precautions: Fall;Other (comment) Precaution Comments: L eye blindness Restrictions Weight Bearing Restrictions: No    Mobility  Bed Mobility Overal bed mobility: Independent Bed Mobility: Supine to Sit              Transfers Overall transfer level: Needs assistance Equipment used: None Transfers: Sit to/from Stand Sit to Stand: Supervision            Ambulation/Gait Ambulation/Gait assistance: Supervision Gait Distance (Feet): 400 Feet Assistive device: None Gait Pattern/deviations: Step-through pattern;Narrow base of support;Drifts right/left Gait velocity: Decreased   General Gait Details: Slow, mildly unsteady gait without DME, pt able to self-correct 2x LOB and intermittent instability; supervision for safety due to impaired cognition and increased fall risk. Cues to improve stability, including widening BOS, keeping hands out of pocket to allow reciprocal swing, establish BOS before turning head, pt doing little to incorporate education and adjust gait mechanics   Stairs              Wheelchair Mobility    Modified Rankin (Stroke Patients Only)       Balance Overall balance assessment: Needs assistance Sitting-balance support: No upper extremity supported;Feet supported Sitting balance-Leahy Scale: Good     Standing balance support: No upper extremity supported;During functional activity Standing balance-Leahy Scale: Good                              Cognition Arousal/Alertness: Awake/alert Behavior During Therapy: Flat affect Overall Cognitive Status: Impaired/Different from baseline Area of Impairment: Awareness;Following commands;Attention;Problem solving;Memory;Safety/judgement                   Current Attention Level: Sustained;Selective Memory: Decreased short-term memory Following Commands: Follows one step commands with increased time;Follows one step commands consistently Safety/Judgement: Decreased awareness of safety;Decreased awareness of deficits Awareness: Intellectual Problem Solving: Slow processing;Requires verbal cues General Comments: Pt getting out of bed with pad saturated in urine- pt not concerned despite cues to see if his sweatpants were wet and educ regarding incontinence; pt declining washup or change of pants (NT notified in hopes pt willing to washup up later today)      Exercises      General Comments        Pertinent Vitals/Pain Pain Assessment: No/denies pain    Home Living                      Prior Function            PT Goals (current goals can now be found in the care plan section) Progress towards PT goals: Progressing toward  goals    Frequency    Min 1X/week      PT Plan Current plan remains appropriate    Co-evaluation              AM-PAC PT "6 Clicks" Mobility   Outcome Measure  Help needed turning from your back to your side while in a flat bed without using bedrails?: None Help needed moving from lying on your back to sitting on the side of  a flat bed without using bedrails?: None Help needed moving to and from a bed to a chair (including a wheelchair)?: A Little Help needed standing up from a chair using your arms (e.g., wheelchair or bedside chair)?: A Little Help needed to walk in hospital room?: A Little Help needed climbing 3-5 steps with a railing? : A Little 6 Click Score: 20    End of Session Equipment Utilized During Treatment: Gait belt Activity Tolerance: Patient tolerated treatment well Patient left: in chair;with call bell/phone within reach;with chair alarm set Nurse Communication: Mobility status (pt's bed saturated but declined washup) PT Visit Diagnosis: Unsteadiness on feet (R26.81);Muscle weakness (generalized) (M62.81)     Time: 4034-7425 PT Time Calculation (min) (ACUTE ONLY): 16 min  Charges:  $Gait Training: 8-22 mins                     Kevin [name], PT, DPT Acute Rehabilitation Services  Pager [phone] Office [phone]  Kevin [name] 02/14/2020, 2:10 PM

## 2020-02-14 NOTE — Progress Notes (Signed)
PROGRESS NOTE  Kevin [name] [facility]  MWU:[id number] DOB: 13-Sep-1965 DOA: 01/23/2020 PCP: Medicine, [facility]   Brief Narrative: Kevin [name] is a 55 y.o. male with a history of chronic combined HFrEF, stage III CKD, CVA, left eye blindness, T2DM, HTN who presented to the ED 1/12 with progressive weakness and increasing cognitive decline over the past month. Work up has shown no infectious etiology to acute metabolic encephalopathy, and neuroimaging has shown no stroke. He remains severely debilitated, so SNF disposition is pursued, complicated by uninsured status.   Assessment & Plan: Principal Problem:   Hypernatremia Active Problems:   Diabetes mellitus without complication (HCC)   History of CVA (cerebrovascular accident)   Essential hypertension   CKD (chronic kidney disease), stage II (HCC)   Chronic combined CHF (congestive heart failure) (HCC)   Acute hypernatremia  Hypernatremia: Due to dehydration, likely altered mentation impaired thirst response leading to free water deficit. This has since been resolved without need for IV fluids.  Acute metabolic encephalopathy: Unclear whether he's returned to his baseline, though he's improved with IV rehydration, so likely due to volume depletion. Work up for reversible etiologies has been negative to date.  - Delirium precautions.    Asymptomatic bacteriuria: No symptoms, UA negative, though Enterobacter grew in culture.  - No treatment indicated. Remains afebrile.  AKI on stage II CKD: Improved. Note CrCl now >60 ml/min once returned to baseline. - Avoid nephrotoxins - Add back thiazide since BP elevated and renal function stable.  History of CVA December 2021, HTN: Was discharged with home health though was unable to pay for these services and agency wouldn't accept cash. Residual right-sided weakness and dysphagia.  - Continue amlodipine and lisinopril, restart HCTZ. - Eliquis (due to low LVEF), statin - Continue  PT/OT efforts.  History of chronic combined HFrEF: Not on diuretics, appearing euvolemic at this time. Last TEE 01/07/2020 with LVEF 20-25%. - Monitor volume status. Remains euvolemic. - Per neurology, can switch back to antiplatelet in lieu of eliquis once LVEF >30-35%. - Continue ACE inhibitor - Unable to start beta blocker due to bradycardia - No indication for diuretic at this time.  - Would likely benefit from spironolactone. Appears to have been discussed with cardiology at last hospitalization where cath was deferred due to poor adherence. Will recommend cardiology outpatient follow up and repeat echo.   T2DM: Recent HbA1c 5.8% indicating adequate control.  - Continue SSI - Holding home metformin  Hypokalemia: Resolved.   Pancytopenia: Unclear etiology, remains stable, not severe, on recheck.  - Recheck CBC intermittently and pursue outpatient follow up if persists.   DVT prophylaxis: Eliquis Code Status: Full Family Communication: None at bedside Disposition Plan:  Status is: Inpatient  Remains inpatient appropriate because:Unsafe d/c plan  Dispo: The patient is from: Home              Anticipated d/c is to: SNF once medicaid and disability are approved. Hospital CSW in consistent communication with DSS medicaid CSW.              Anticipated d/c date is: > 3 days              Patient currently is medically stable to d/c.   Difficult to place patient Yes  Consultants:   None  Procedures:   None  Antimicrobials:  None   Subjective: No new complaints. Reports no chest pain.  Objective: Vitals:   02/13/20 1500 02/13/20 2116 02/14/20 0518 02/14/20 1257  BP:  Marland Kitchen)  142/88 (!) 157/87 140/75  Pulse:  70 (!) 43 62  Resp:  16 18 19   Temp:  98 F (36.7 C) (!) 97.4 F (36.3 C) 98.2 F (36.8 C)  TempSrc:      SpO2:  98% 100% 100%  Weight:      Height: 6' (1.829 m)       Intake/Output Summary (Last 24 hours) at 02/14/2020 1526 Last data filed at 02/14/2020  1343 Gross per 24 hour  Intake 440 ml  Output --  Net 440 ml   Filed Weights   02/10/20 0421 02/11/20 0233 02/12/20 0356  Weight: 69.5 kg 70.4 kg 70 kg   Gen: 55 y.o. male in no distress Pulm: Nonlabored breathing room air. Clear. CV: Regular rate and rhythm. No murmur, rub, or gallop. No JVD, no dependent edema. GI: Abdomen soft, non-tender, non-distended, with normoactive bowel sounds.  Ext: Warm, no deformities Skin: No rashes, lesions or ulcers on visualized skin. Neuro: Alert and disoriented. No focal neurological deficits. Psych: Judgement and insight appear impaired. Mood euthymic & affect congruent. Behavior is appropriate.    Data Reviewed: I have personally reviewed following labs and imaging studies  CBC: Recent Labs  Lab 02/09/20 0143 02/12/20 0059  WBC 3.2* 3.9*  NEUTROABS  --  1.8  HGB 11.3* 10.5*  HCT 35.6* 32.1*  MCV 90.1 88.7  PLT 121* 134*   Basic Metabolic Panel: Recent Labs  Lab 02/09/20 0143 02/12/20 0059  NA 136 138  K 3.7 3.8  CL 101 101  CO2 27 28  GLUCOSE 119* 115*  BUN 17 23*  CREATININE 1.04 1.14  CALCIUM 9.1 9.0   GFR: Estimated Creatinine Clearance: 72.5 mL/min (by C-G formula based on SCr of 1.14 mg/dL).  CBG: Recent Labs  Lab 02/13/20 1203 02/13/20 1700 02/13/20 2113 02/14/20 0811 02/14/20 1156  GLUCAP 141* 130* 134* 186* 141*   Urine analysis:    Component Value Date/Time   COLORURINE YELLOW 01/28/2020 0727   APPEARANCEUR HAZY (A) 01/28/2020 0727   LABSPEC 1.015 01/28/2020 0727   PHURINE 6.0 01/28/2020 0727   GLUCOSEU 50 (A) 01/28/2020 0727   HGBUR SMALL (A) 01/28/2020 0727   BILIRUBINUR NEGATIVE 01/28/2020 0727   KETONESUR 5 (A) 01/28/2020 0727   PROTEINUR NEGATIVE 01/28/2020 0727   NITRITE NEGATIVE 01/28/2020 0727   LEUKOCYTESUR NEGATIVE 01/28/2020 0727    Scheduled Meds: . amLODipine  10 mg Oral Daily  . apixaban  5 mg Oral BID  . atorvastatin  80 mg Oral Daily  . hydrochlorothiazide  25 mg Oral Daily   . insulin aspart  0-9 Units Subcutaneous TID WC  . lisinopril  20 mg Oral Daily  . magnesium oxide  400 mg Oral BID  . multivitamin with minerals  1 tablet Oral Daily  . sodium chloride flush  3 mL Intravenous Q12H   Continuous Infusions:   LOS: 21 days   Time spent: 25 minutes.  01/30/2020, MD [facility] [email or web address] 02/14/2020, 3:26 PM

## 2020-02-15 LAB — GLUCOSE, CAPILLARY
Glucose-Capillary: 98 mg/dL (ref 70–99)
Glucose-Capillary: 114 mg/dL — ABNORMAL HIGH (ref 70–99)
Glucose-Capillary: 128 mg/dL — ABNORMAL HIGH (ref 70–99)
Glucose-Capillary: 100 mg/dL — ABNORMAL HIGH (ref 70–99)

## 2020-02-15 MED ORDER — HALOPERIDOL LACTATE 5 MG/ML IJ SOLN
2.0000 mg | Freq: Four times a day (QID) | INTRAMUSCULAR | Status: DC | PRN
  Administered 2020-02-18: 2 mg via INTRAVENOUS
  Filled 2020-02-15: qty 1

## 2020-02-15 MED ORDER — HALOPERIDOL LACTATE 5 MG/ML IJ SOLN
2.0000 mg | Freq: Once | INTRAMUSCULAR | Status: AC
  Administered 2020-02-15: 2 mg via INTRAVENOUS
  Filled 2020-02-15: qty 1

## 2020-02-15 NOTE — Progress Notes (Signed)
PROGRESS NOTE  Login [facility]  WSF:[id number] DOB: August 18, 1965 DOA: 01/23/2020 PCP: Medicine, [facility]   Brief Narrative: Kevin [name] is a 55 y.o. male with a history of chronic combined HFrEF, stage III CKD, CVA, left eye blindness, T2DM, HTN who presented to the ED 1/12 with progressive weakness and increasing cognitive decline over the past month. Work up has shown no infectious etiology to acute metabolic encephalopathy, and neuroimaging has shown no stroke. He remains severely debilitated, so SNF disposition is pursued, complicated by uninsured status.   Assessment & Plan: Principal Problem:   Hypernatremia Active Problems:   Diabetes mellitus without complication (HCC)   History of CVA (cerebrovascular accident)   Essential hypertension   CKD (chronic kidney disease), stage II (HCC)   Chronic combined CHF (congestive heart failure) (HCC)   Acute hypernatremia  Hypernatremia: Due to dehydration, likely altered mentation impaired thirst response leading to free water deficit. This has since been resolved without need for IV fluids.  Acute metabolic encephalopathy: Likely related to recent stroke and now manifesting as new baseline which is quite impaired cognitively. Work up for reversible etiologies has been negative to date.  - Delirium precautions.   - Haldol prn agitation despite frequent redirection and family visitation.  Asymptomatic bacteriuria: No symptoms, UA negative, though Enterobacter grew in culture.  - No treatment indicated. Remains afebrile.  AKI on stage II CKD: Improved. Note CrCl now >60 ml/min once returned to baseline. - Avoid nephrotoxins - Add back thiazide since BP elevated and renal function stable.  History of CVA December 2021, HTN: Was discharged with home health though was unable to pay for these services and agency wouldn't accept cash. Residual right-sided weakness and dysphagia.  - Continue amlodipine and lisinopril, restart  HCTZ. - Eliquis (due to low LVEF), statin - Continue PT/OT efforts.  History of chronic combined HFrEF: Not on diuretics, appearing euvolemic at this time. Last TEE 01/07/2020 with LVEF 20-25%. - Monitor volume status. Remains euvolemic. - Per neurology, can switch back to antiplatelet in lieu of eliquis once LVEF >30-35%. - Continue ACE inhibitor - Unable to start beta blocker due to bradycardia - No indication for diuretic at this time.  - Would likely benefit from spironolactone. Appears to have been discussed with cardiology at last hospitalization where cath was deferred due to poor adherence. Will recommend cardiology outpatient follow up and repeat echo.   T2DM: Recent HbA1c 5.8% indicating adequate control.  - Continue SSI - Holding home metformin  Hypokalemia: Resolved.   Pancytopenia: Unclear etiology, remains stable, not severe, on recheck.  - Recheck CBC intermittently and pursue outpatient follow up if persists.   DVT prophylaxis: Eliquis Code Status: Full Family Communication: Mother at bedside Disposition Plan:  Status is: Inpatient  Remains inpatient appropriate because:Unsafe d/c plan  Dispo: The patient is from: Home              Anticipated d/c is to: SNF once medicaid and disability are approved. Hospital CSW in consistent communication with DSS medicaid CSW.              Anticipated d/c date is: > 3 days              Patient currently is medically stable to d/c.   Difficult to place patient Yes  Consultants:   None  Procedures:   None  Antimicrobials:  None   Subjective: Pt without complaints, last night's events noted.  Objective: Vitals:   02/13/20 2116 02/14/20  0518 02/14/20 1257 02/14/20 2006  BP: (!) 142/88 (!) 157/87 140/75 135/74  Pulse: 70 (!) 43 62 62  Resp: 16 18 19 18   Temp: 98 F (36.7 C) (!) 97.4 F (36.3 C) 98.2 F (36.8 C) 98.3 F (36.8 C)  TempSrc:    Oral  SpO2: 98% 100% 100% 100%  Weight:      Height:         Intake/Output Summary (Last 24 hours) at 02/15/2020 1124 Last data filed at 02/15/2020 0920 Gross per 24 hour  Intake 720 ml  Output 800 ml  Net -80 ml   Filed Weights   02/10/20 0421 02/11/20 0233 02/12/20 0356  Weight: 69.5 kg 70.4 kg 70 kg   Gen: 55 y.o. male in no distress Pulm: Nonlabored breathing room air. Clear. CV: Regular rate and rhythm. No murmur, rub, or gallop. No JVD, no dependent edema. GI: Abdomen soft, non-tender, non-distended, with normoactive bowel sounds.  Ext: Warm, no deformities Skin: No rashes, lesions or ulcers on visualized skin. Neuro: Resting quietly, disoriented no new deficits noted. Psych: Calm.  Data Reviewed: I have personally reviewed following labs and imaging studies  CBC: Recent Labs  Lab 02/09/20 0143 02/12/20 0059  WBC 3.2* 3.9*  NEUTROABS  --  1.8  HGB 11.3* 10.5*  HCT 35.6* 32.1*  MCV 90.1 88.7  PLT 121* 134*   Basic Metabolic Panel: Recent Labs  Lab 02/09/20 0143 02/12/20 0059  NA 136 138  K 3.7 3.8  CL 101 101  CO2 27 28  GLUCOSE 119* 115*  BUN 17 23*  CREATININE 1.04 1.14  CALCIUM 9.1 9.0   GFR: Estimated Creatinine Clearance: 72.5 mL/min (by C-G formula based on SCr of 1.14 mg/dL).  CBG: Recent Labs  Lab 02/14/20 0811 02/14/20 1156 02/14/20 1635 02/14/20 2105 02/15/20 0725  GLUCAP 186* 141* 82 153* 98   Urine analysis:    Component Value Date/Time   COLORURINE YELLOW 01/28/2020 0727   APPEARANCEUR HAZY (A) 01/28/2020 0727   LABSPEC 1.015 01/28/2020 0727   PHURINE 6.0 01/28/2020 0727   GLUCOSEU 50 (A) 01/28/2020 0727   HGBUR SMALL (A) 01/28/2020 0727   BILIRUBINUR NEGATIVE 01/28/2020 0727   KETONESUR 5 (A) 01/28/2020 0727   PROTEINUR NEGATIVE 01/28/2020 0727   NITRITE NEGATIVE 01/28/2020 0727   LEUKOCYTESUR NEGATIVE 01/28/2020 0727    Scheduled Meds: . amLODipine  10 mg Oral Daily  . apixaban  5 mg Oral BID  . atorvastatin  80 mg Oral Daily  . hydrochlorothiazide  25 mg Oral Daily  .  insulin aspart  0-9 Units Subcutaneous TID WC  . lisinopril  20 mg Oral Daily  . magnesium oxide  400 mg Oral BID  . multivitamin with minerals  1 tablet Oral Daily  . sodium chloride flush  3 mL Intravenous Q12H   Continuous Infusions:   LOS: 22 days   Time spent: 25 minutes.  01/30/2020, MD [facility] [email or web address] 02/15/2020, 11:24 AM

## 2020-02-15 NOTE — Progress Notes (Signed)
Occupational Therapy Treatment Patient Details Name: Kevin [name] MRN: [id number] DOB: 1965/06/18 Today's Date: 02/15/2020    History of present illness Pt is a 55 y.o. male admitted 01/23/20 with weakness and generalized decline at home; workup for metabolic encephalopathy, hyponatremia, AKI on CKD. 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   OT comments  Pt awakened from nap, unaware of urinary incontinence. Ambulated to bathroom and cleaned up with supervision. Pt following commands with context cues, reduction of distractions and simple one step commands. Pt declined further ambulation in hall or staying up in chair, returned to bed. Continues to be appropriate for SNF level rehab.   Follow Up Recommendations  SNF;Supervision/Assistance - 24 hour    Equipment Recommendations  None recommended by OT    Recommendations for Other Services      Precautions / Restrictions Precautions Precautions: Fall;Other (comment) Precaution Comments: L eye blindness       Mobility Bed Mobility Overal bed mobility: Independent             General bed mobility comments: HOB flat, awakened from nap  Transfers Overall transfer level: Needs assistance Equipment used: None Transfers: Sit to/from Stand Sit to Stand: Supervision         General transfer comment: for safety, somewhat groggy from just waking up    Balance Overall balance assessment: Needs assistance   Sitting balance-Leahy Scale: Good       Standing balance-Leahy Scale: Good                             ADL either performed or assessed with clinical judgement   ADL Overall ADL's : Needs assistance/impaired     Grooming: Wash/dry hands;Standing;Supervision/safety       Lower Body Bathing: Supervison/ safety;Sit to/from stand       Lower Body Dressing: Supervision/safety;Sit to/from stand   Toilet Transfer:  Supervision/safety;Ambulation;Comfort height toilet             General ADL Comments: responds favorably to reduction of distractions, one simple command at a time and extra time to process/respond     Vision       Perception     Praxis      Cognition Arousal/Alertness: Awake/alert Behavior During Therapy: Flat affect Overall Cognitive Status: Impaired/Different from baseline Area of Impairment: Awareness;Following commands;Attention;Problem solving;Memory;Safety/judgement                   Current Attention Level: Sustained Memory: Decreased short-term memory Following Commands: Follows one step commands with increased time;Follows one step commands consistently Safety/Judgement: Decreased awareness of safety;Decreased awareness of deficits   Problem Solving: Slow processing;Requires verbal cues General Comments: pt with urinary incontinence, seemingly unaware, agreed to clean up        Exercises     Shoulder Instructions       General Comments      Pertinent Vitals/ Pain       Pain Assessment: No/denies pain  Home Living                                          Prior Functioning/Environment              Frequency  Min 2X/week        Progress Toward Goals  OT Goals(current  goals can now be found in the care plan section)  Progress towards OT goals: Progressing toward goals  Acute Rehab OT Goals Patient Stated Goal: Rehab at SNF before return home OT Goal Formulation: With patient Time For Goal Achievement: 02/22/20 Potential to Achieve Goals: Fair  Plan Discharge plan remains appropriate    Co-evaluation                 AM-PAC OT "6 Clicks" Daily Activity     Outcome Measure   Help from another person eating meals?: None Help from another person taking care of personal grooming?: A Little Help from another person toileting, which includes using toliet, bedpan, or urinal?: A Little Help from another  person bathing (including washing, rinsing, drying)?: A Little Help from another person to put on and taking off regular upper body clothing?: None Help from another person to put on and taking off regular lower body clothing?: A Little 6 Click Score: 20    End of Session    OT Visit Diagnosis: Unsteadiness on feet (R26.81);Cognitive communication deficit (R41.841);Low vision, both eyes (H54.2);Other symptoms and signs involving cognitive function;Muscle weakness (generalized) (M62.81);Other abnormalities of gait and mobility (R26.89) Symptoms and signs involving cognitive functions: Cerebral infarction   Activity Tolerance Patient tolerated treatment well   Patient Left in bed;with call bell/phone within reach;with bed alarm set   Nurse Communication          Time: 1525-1540 OT Time Calculation (min): 15 min  Charges: OT General Charges $OT Visit: 1 Visit OT Treatments $Self Care/Home Management : 8-22 mins  Martie Round, OTR/L Acute Rehabilitation Services Pager: [phone] Office: [phone]   Evern Bio 02/15/2020, 3:56 PM

## 2020-02-15 NOTE — Care Management (Addendum)
Spoke to patient's mother Kevin [name] via phone. NCM explained no SNF bed offers. [facility] and [facility] both stated even with LOG patient's disability would need to be active not pending. Kevin [name] keeps repeating DSS worker Ms Mins has told her if [facility] offers a LOG , it would not be a problem to find a SNF.  NCM explained that NCM received no bed offers after faxing FL2. NCM called multiple SNF's and spoke directly to admission's coordinator and was told disability would need to be active.  Kevin [name] voiced understanding.  Kevin [name] has gotten [employer] for patient but does not have a card or information yet. NCM explained we will need policy information to confirm if he has SNF / long term care benefit.  NCM also explained [facility] leadership is involved. Kevin [name] would like to speak to leadership. NCM asked Kevin [name] which phone number she would like leadership to call . Kevin [name] cell phone not working , she will be in patient's room all afternoon.  Kevin [name] aware and will call into patient's room after 2 pm.    Messaged Chaplain regarding mother wanting to do  Advance Directives.  Ronny Flurry RN

## 2020-02-15 NOTE — Progress Notes (Signed)
This RN called by CCMD due to cardiac monitoring leads removed. Upon entering room pt was getting dressed in his own clothes and putting on his shoes. Pt stated he was leaving and going home. This RN attempted to calm pt along with NT. Pt was adamant on leaving. Security called and informed. Security was able to meet pt at elevators to calm pt. Pt mom was called in attempt to redirect pt. MD on call was paged and made aware of situation. After a while pts mom was able to direct pt back to his room. MD ordered one time dose of Haldol. See Mar. MD approved Mom to visit in attempt pt will be cooperative tonight until seen by MD in morning.  0345 pt calmly resting in bed. Mom at bedside. Will continue to monitor pt.

## 2020-02-16 LAB — GLUCOSE, CAPILLARY
Glucose-Capillary: 117 mg/dL — ABNORMAL HIGH (ref 70–99)
Glucose-Capillary: 116 mg/dL — ABNORMAL HIGH (ref 70–99)
Glucose-Capillary: 200 mg/dL — ABNORMAL HIGH (ref 70–99)
Glucose-Capillary: 134 mg/dL — ABNORMAL HIGH (ref 70–99)

## 2020-02-16 MED ORDER — METFORMIN HCL 500 MG PO TABS
1000.0000 mg | ORAL_TABLET | Freq: Two times a day (BID) | ORAL | Status: DC
  Administered 2020-02-16 – 2020-02-26 (×18): 1000 mg via ORAL
  Filled 2020-02-16 (×19): qty 2

## 2020-02-16 NOTE — Progress Notes (Signed)
PROGRESS NOTE  Raffi Milstein [facility]  QIW:[id number] DOB: 11-28-1965 DOA: 01/23/2020 PCP: Medicine, [facility]   Brief Narrative: Kevin [name] is a 55 y.o. male with a history of chronic combined HFrEF, stage III CKD, CVA, left eye blindness, T2DM, HTN who presented to the ED 1/12 with progressive weakness and increasing cognitive decline over the past month. Work up has shown no infectious etiology to acute metabolic encephalopathy, and neuroimaging has shown no stroke. He remains severely debilitated, so SNF disposition is pursued, complicated by uninsured status.   Assessment & Plan: Principal Problem:   Hypernatremia Active Problems:   Diabetes mellitus without complication (HCC)   History of CVA (cerebrovascular accident)   Essential hypertension   CKD (chronic kidney disease), stage II (HCC)   Chronic combined CHF (congestive heart failure) (HCC)   Acute hypernatremia  Hypernatremia: Due to dehydration, likely altered mentation impaired thirst response leading to free water deficit. This has since been resolved without need for IV fluids.  Acute metabolic encephalopathy: Likely related to recent stroke and now manifesting as new baseline which is quite impaired cognitively. Work up for reversible etiologies has been negative to date.  - Delirium precautions.   - Haldol prn agitation despite frequent redirection and family visitation.  Asymptomatic bacteriuria: No symptoms, UA negative, though Enterobacter grew in culture.  - No treatment indicated. Remains afebrile.  AKI on stage II CKD: Improved. Note CrCl now >60 ml/min once returned to baseline. - Avoid nephrotoxins - Add back thiazide since BP elevated and renal function stable.  History of CVA December 2021, HTN: Was discharged with home health though was unable to pay for these services and agency wouldn't accept cash. Residual right-sided weakness and dysphagia.  - Continue amlodipine, lisinopril, HCTZ. -  Eliquis (due to low LVEF), statin - Continue PT/OT efforts.  History of chronic combined HFrEF: Not on diuretics, appearing euvolemic at this time. Last TEE 01/07/2020 with LVEF 20-25%. - Monitor volume status. Remains euvolemic. - Per neurology, can switch back to antiplatelet in lieu of eliquis once LVEF >30-35%. - Continue ACE inhibitor - Unable to start beta blocker due to bradycardia, 1st degree AV block on review of telemetry today. - No indication for diuretic at this time.  - Would likely benefit from spironolactone. Appears to have been discussed with cardiology at last hospitalization where cath was deferred due to poor adherence. Will recommend cardiology outpatient follow up and repeat echo.   T2DM: Recent HbA1c 5.8% indicating adequate control.  - CBGs consistently at inpatient goal. Will stop regular checks and SSI, restart home metformin.  Hypokalemia: Resolved.   Pancytopenia: Unclear etiology, remains stable, not severe, on recheck.  - Recheck CBC intermittently and pursue outpatient follow up if persists.   DVT prophylaxis: Eliquis Code Status: Full Family Communication: None at bedside Disposition Plan:  Status is: Inpatient  Remains inpatient appropriate because:Unsafe d/c plan  Dispo: The patient is from: Home              Anticipated d/c is to: SNF once medicaid and disability are approved. Hospital CSW in consistent communication with DSS medicaid CSW.              Anticipated d/c date is: > 3 days              Patient currently is medically stable to d/c.   Difficult to place patient Yes  Consultants:   None  Procedures:   None  Antimicrobials:  None   Subjective:  No complaints, having BMs. No attempt at elopement in past 24 hours.  Objective: Vitals:   02/15/20 1342 02/15/20 2101 02/16/20 0500 02/16/20 0511  BP: (!) 151/75 139/77  (!) 150/87  Pulse: 73 77  (!) 52  Resp:  18  20  Temp: 98 F (36.7 C) 98.1 F (36.7 C)  98.2 F (36.8 C)   TempSrc: Oral Oral  Oral  SpO2: 100% 100%  100%  Weight:   68.9 kg   Height:        Intake/Output Summary (Last 24 hours) at 02/16/2020 1150 Last data filed at 02/16/2020 0715 Gross per 24 hour  Intake 600 ml  Output 1225 ml  Net -625 ml   Filed Weights   02/11/20 0233 02/12/20 0356 02/16/20 0500  Weight: 70.4 kg 70 kg 68.9 kg   Gen: 55 y.o. male no distress Pulm: Nonlabored, clear CV: RRR without edema GI: Nontender, nondistended, +BS, BM this AM Ext: No deformities Skin: No new wounds noted Neuro: Ambulatory without assistance Psych: Calm  Data Reviewed: I have personally reviewed following labs and imaging studies  CBC: Recent Labs  Lab 02/12/20 0059  WBC 3.9*  NEUTROABS 1.8  HGB 10.5*  HCT 32.1*  MCV 88.7  PLT 134*   Basic Metabolic Panel: Recent Labs  Lab 02/12/20 0059  NA 138  K 3.8  CL 101  CO2 28  GLUCOSE 115*  BUN 23*  CREATININE 1.14  CALCIUM 9.0   GFR: Estimated Creatinine Clearance: 71.4 mL/min (by C-G formula based on SCr of 1.14 mg/dL).  CBG: Recent Labs  Lab 02/15/20 0725 02/15/20 1201 02/15/20 1704 02/15/20 2100 02/16/20 0735  GLUCAP 98 114* 128* 100* 117*   Urine analysis:    Component Value Date/Time   COLORURINE YELLOW 01/28/2020 0727   APPEARANCEUR HAZY (A) 01/28/2020 0727   LABSPEC 1.015 01/28/2020 0727   PHURINE 6.0 01/28/2020 0727   GLUCOSEU 50 (A) 01/28/2020 0727   HGBUR SMALL (A) 01/28/2020 0727   BILIRUBINUR NEGATIVE 01/28/2020 0727   KETONESUR 5 (A) 01/28/2020 0727   PROTEINUR NEGATIVE 01/28/2020 0727   NITRITE NEGATIVE 01/28/2020 0727   LEUKOCYTESUR NEGATIVE 01/28/2020 0727    Scheduled Meds: . amLODipine  10 mg Oral Daily  . apixaban  5 mg Oral BID  . atorvastatin  80 mg Oral Daily  . hydrochlorothiazide  25 mg Oral Daily  . lisinopril  20 mg Oral Daily  . magnesium oxide  400 mg Oral BID  . metFORMIN  1,000 mg Oral BID WC  . multivitamin with minerals  1 tablet Oral Daily  . sodium chloride flush  3  mL Intravenous Q12H   Continuous Infusions:   LOS: 23 days   Time spent: 25 minutes.  Tyrone Nine, MD [facility] [email or web address] 02/16/2020, 11:50 AM

## 2020-02-17 LAB — GLUCOSE, CAPILLARY
Glucose-Capillary: 91 mg/dL (ref 70–99)
Glucose-Capillary: 114 mg/dL — ABNORMAL HIGH (ref 70–99)
Glucose-Capillary: 96 mg/dL (ref 70–99)

## 2020-02-17 NOTE — Progress Notes (Signed)
PROGRESS NOTE  Kevin [name] [facility]  WER:[id number] DOB: 06-05-1965 DOA: 01/23/2020 PCP: Medicine, [facility]   Brief Narrative: Kevin [name] is a 55 y.o. male with a history of chronic combined HFrEF, stage III CKD, CVA, left eye blindness, T2DM, HTN who presented to the ED 1/12 with progressive weakness and increasing cognitive decline over the past month. Work up has shown no infectious etiology to acute metabolic encephalopathy, and neuroimaging has shown no stroke. He remains severely debilitated, so SNF disposition is pursued, complicated by uninsured status.   Assessment & Plan: Principal Problem:   Hypernatremia Active Problems:   Diabetes mellitus without complication (HCC)   History of CVA (cerebrovascular accident)   Essential hypertension   CKD (chronic kidney disease), stage II (HCC)   Chronic combined CHF (congestive heart failure) (HCC)   Acute hypernatremia  Hypernatremia: Due to dehydration, likely altered mentation impaired thirst response leading to free water deficit. This has since been resolved without need for IV fluids.  Acute metabolic encephalopathy: Likely related to recent stroke and now manifesting as new baseline which is quite impaired cognitively. Work up for reversible etiologies has been negative to date.  - Delirium precautions.   - Haldol prn agitation despite frequent redirection and family visitation.  Asymptomatic bacteriuria: No symptoms, UA negative, though Enterobacter grew in culture.  - No treatment indicated. Remains afebrile.  AKI on stage II CKD: Improved. Note CrCl now >60 ml/min. - Avoid nephrotoxins - Added back thiazide since BP elevated and renal function stable. Recheck metabolic panel in AM.  History of CVA December 2021, HTN: Was discharged with home health though was unable to pay for these services and agency wouldn't accept cash. Residual right-sided weakness and dysphagia.  - Continue amlodipine, lisinopril,  HCTZ. - Eliquis (due to low LVEF), statin - Continue PT/OT efforts.  History of chronic combined HFrEF: Not on diuretics, appearing euvolemic at this time. Last TEE 01/07/2020 with LVEF 20-25%. - Monitor volume status. Remains euvolemic, no diuretic currently indicated. - Per neurology, can switch back to antiplatelet in lieu of eliquis once LVEF >30-35%. - Continue ACE inhibitor - Unable to start beta blocker due to bradycardia, 1st degree AV block on review of telemetry today. - No indication for diuretic at this time.  - Would likely benefit from spironolactone. Appears to have been discussed with cardiology at last hospitalization where cath was deferred due to poor adherence. Will recommend cardiology outpatient follow up and repeat echo.   T2DM: Recent HbA1c 5.8% indicating adequate control.  - CBGs consistently at inpatient goal. Will stop regular checks and SSI, restarted home metformin, SCr check in AM.  Hypokalemia: Resolved.  - Recheck in AM, last check was 1/31.   Pancytopenia: Unclear etiology, remains stable - Recheck CBC w/diff in AM.    DVT prophylaxis: Eliquis Code Status: Full Family Communication: None at bedside Disposition Plan:  Status is: Inpatient  Remains inpatient appropriate because:Unsafe d/c plan  Dispo: The patient is from: Home              Anticipated d/c is to: SNF once medicaid and disability are approved. Hospital CSW in consistent communication with DSS medicaid CSW.              Anticipated d/c date is: > 3 days              Patient currently is medically stable to d/c.   Difficult to place patient Yes  Consultants:   None  Procedures:  None  Antimicrobials:  None   Subjective: Feels fine, didn't sleep well. Eating ok. No new complaints. Specifically denies feeling ill or having any pain anywhere.   Objective: Vitals:   02/16/20 1319 02/16/20 2145 02/17/20 0413 02/17/20 0414  BP: (!) 148/82 (!) 155/83 (!) 147/94   Pulse: 61  65 (!) 59   Resp: 16 18 19    Temp: 98.5 F (36.9 C) (!) 97.4 F (36.3 C) 97.6 F (36.4 C)   TempSrc: Oral Oral Oral   SpO2: 100% 100% 99%   Weight:    69.9 kg  Height:        Intake/Output Summary (Last 24 hours) at 02/17/2020 1051 Last data filed at 02/16/2020 2200 Gross per 24 hour  Intake 600 ml  Output 500 ml  Net 100 ml   Filed Weights   02/12/20 0356 02/16/20 0500 02/17/20 0414  Weight: 70 kg 68.9 kg 69.9 kg   Gen: Tired 54yo M in no distress sitting in chair Pulm: Nonlabored, clear CV: RRR, no murmur or JVD GI: Soft, NT, ND Ext: WWP, no deformities Skin: No major wounds and no bleeding sites noted Neuro: Steady gait, alert, not oriented Psych: Drowsy but rousable  Data Reviewed: I have personally reviewed following labs and imaging studies  CBC: Recent Labs  Lab 02/12/20 0059  WBC 3.9*  NEUTROABS 1.8  HGB 10.5*  HCT 32.1*  MCV 88.7  PLT 134*   Basic Metabolic Panel: Recent Labs  Lab 02/12/20 0059  NA 138  K 3.8  CL 101  CO2 28  GLUCOSE 115*  BUN 23*  CREATININE 1.14  CALCIUM 9.0   GFR: Estimated Creatinine Clearance: 72.4 mL/min (by C-G formula based on SCr of 1.14 mg/dL).  CBG: Recent Labs  Lab 02/16/20 0735 02/16/20 1206 02/16/20 1712 02/16/20 2143 02/17/20 0803  GLUCAP 117* 116* 200* 134* 91   Urine analysis:    Component Value Date/Time   COLORURINE YELLOW 01/28/2020 0727   APPEARANCEUR HAZY (A) 01/28/2020 0727   LABSPEC 1.015 01/28/2020 0727   PHURINE 6.0 01/28/2020 0727   GLUCOSEU 50 (A) 01/28/2020 0727   HGBUR SMALL (A) 01/28/2020 0727   BILIRUBINUR NEGATIVE 01/28/2020 0727   KETONESUR 5 (A) 01/28/2020 0727   PROTEINUR NEGATIVE 01/28/2020 0727   NITRITE NEGATIVE 01/28/2020 0727   LEUKOCYTESUR NEGATIVE 01/28/2020 0727    Scheduled Meds: . amLODipine  10 mg Oral Daily  . apixaban  5 mg Oral BID  . atorvastatin  80 mg Oral Daily  . hydrochlorothiazide  25 mg Oral Daily  . lisinopril  20 mg Oral Daily  . magnesium  oxide  400 mg Oral BID  . metFORMIN  1,000 mg Oral BID WC  . multivitamin with minerals  1 tablet Oral Daily  . sodium chloride flush  3 mL Intravenous Q12H   Continuous Infusions:   LOS: 24 days   Time spent: 25 minutes.  01/30/2020, MD [facility] [email or web address] 02/17/2020, 10:51 AM

## 2020-02-18 LAB — GLUCOSE, CAPILLARY: Glucose-Capillary: 89 mg/dL (ref 70–99)

## 2020-02-18 LAB — COMPREHENSIVE METABOLIC PANEL
ALT: 24 U/L (ref 0–44)
AST: 19 U/L (ref 15–41)
Albumin: 3.2 g/dL — ABNORMAL LOW (ref 3.5–5.0)
Alkaline Phosphatase: 61 U/L (ref 38–126)
Anion gap: 10 (ref 5–15)
BUN: 24 mg/dL — ABNORMAL HIGH (ref 6–20)
CO2: 29 mmol/L (ref 22–32)
Calcium: 9.2 mg/dL (ref 8.9–10.3)
Chloride: 98 mmol/L (ref 98–111)
Creatinine, Ser: 1.36 mg/dL — ABNORMAL HIGH (ref 0.61–1.24)
GFR, Estimated: >60 mL/min (ref >60)
Glucose, Bld: 101 mg/dL — ABNORMAL HIGH (ref 70–99)
Potassium: 3.8 mmol/L (ref 3.5–5.1)
Sodium: 137 mmol/L (ref 135–145)
Total Bilirubin: 1 mg/dL (ref 0.3–1.2)
Total Protein: 6.3 g/dL — ABNORMAL LOW (ref 6.5–8.1)

## 2020-02-18 LAB — CBC WITH DIFFERENTIAL/PLATELET
Abs Immature Granulocytes: 0 10*3/uL (ref 0.00–0.07)
Basophils Absolute: 0 10*3/uL (ref 0.0–0.1)
Basophils Relative: 1 %
Eosinophils Absolute: 0.1 10*3/uL (ref 0.0–0.5)
Eosinophils Relative: 3 %
HCT: 33.2 % — ABNORMAL LOW (ref 39.0–52.0)
Hemoglobin: 11.2 g/dL — ABNORMAL LOW (ref 13.0–17.0)
Immature Granulocytes: 0 %
Lymphocytes Relative: 45 %
Lymphs Abs: 1.7 10*3/uL (ref 0.7–4.0)
MCH: 29.9 pg (ref 26.0–34.0)
MCHC: 33.7 g/dL (ref 30.0–36.0)
MCV: 88.8 fL (ref 80.0–100.0)
Monocytes Absolute: 0.3 10*3/uL (ref 0.1–1.0)
Monocytes Relative: 9 %
Neutro Abs: 1.5 10*3/uL — ABNORMAL LOW (ref 1.7–7.7)
Neutrophils Relative %: 42 %
Platelets: 153 10*3/uL (ref 150–400)
RBC: 3.74 MIL/uL — ABNORMAL LOW (ref 4.22–5.81)
RDW: 12.3 % (ref 11.5–15.5)
WBC: 3.7 10*3/uL — ABNORMAL LOW (ref 4.0–10.5)
nRBC: 0 % (ref 0.0–0.2)

## 2020-02-18 NOTE — Progress Notes (Signed)
PROGRESS NOTE    Kevin [name] [facility]  TTS:[id number] DOB: 28-Jul-1965 DOA: 01/23/2020 PCP: Medicine, [facility]   Brief Narrative:  Kevin [name] is a 55 y.o. male with a history of chronic combined HFrEF, stage III CKD, CVA, left eye blindness, T2DM, HTN who presented to the ED 1/12 with progressive weakness and increasing cognitive decline over the past month. Work up has shown no infectious etiology to acute metabolic encephalopathy, and neuroimaging has shown no stroke. He remains severely debilitated, so SNF disposition is pursued, complicated by uninsured status.   Assessment & Plan:   Principal Problem:   Hypernatremia Active Problems:   Diabetes mellitus without complication (HCC)   History of CVA (cerebrovascular accident)   Essential hypertension   CKD (chronic kidney disease), stage III (HCC)   Chronic CHF (congestive heart failure) (HCC)   Acute hypernatremia   Hypernatremia: Due to dehydration, likely altered mentation impaired thirst response leading to free water deficit. This has since been resolved without need for IV fluids. -Monitor in am  Acute metabolic encephalopathy: Likely related to recent stroke and now manifesting as new baseline which is quite impaired cognitively. Work up for reversible etiologies has been negative to date.  - Delirium precautions.   - Haldol prn agitation despite frequent redirection and family visitation.  Asymptomatic bacteriuria: No symptoms, UA negative, though Enterobacter grew in culture.  - No treatment indicated. Remains afebrile.  AKI on stage II CKD: Improved. Note CrCl now >60 ml/min. - Avoid nephrotoxins - Added back thiazide since BP elevated and renal function stable. Recheck metabolic panel in AM.  History of CVA December 2021, HTN: Was discharged with home health though was unable to pay for these services and agency wouldn't accept cash. Residual right-sided weakness and dysphagia.  - Continue  amlodipine, lisinopril, HCTZ. - Eliquis (due to low LVEF), statin - Continue PT/OT efforts.  History of chronic combined HFrEF: Not on diuretics, appearing euvolemic at this time. Last TEE 01/07/2020 with LVEF 20-25%. - Monitor volume status. Remains euvolemic, no diuretic currently indicated. - Per neurology, can switch back to antiplatelet in lieu of eliquis once LVEF >30-35%. - Continue ACE inhibitor - Unable to start beta blocker due to bradycardia, 1st degree AV block on review of telemetry today. - No indication for diuretic at this time.  - Would likely benefit from spironolactone. Appears to have been discussed with cardiology at last hospitalization where cath was deferred due to poor adherence. Will recommend cardiology outpatient follow up and repeat echo.   T2DM: Recent HbA1c 5.8% indicating adequate control.  - CBGs consistently at inpatient goal. Will stop regular checks and SSI, restarted home metformin, SCr check in AM.  Hypokalemia: Resolved.  - Recheck in AM, last check was 1/31.   Pancytopenia: Unclear etiology, remains stable - Recheck CBC w/diff in AM.    DVT prophylaxis: Eliquis Code Status: Full Family Communication: None at bedside Disposition Plan:  Status is: Inpatient  Remains inpatient appropriate because:Unsafe d/c plan  Dispo: The patient is from: Home  Anticipated d/c is to: SNF once medicaid and disability are approved. Hospital CSW in consistent communication with DSS medicaid CSW.  Anticipated d/c date is: > 3 days  Patient currently is medically stable to d/c.             Difficult to place patient Yes  Consultants:   None  Procedures:   None  Antimicrobials:  None   Subjective: Patient seen and evaluated today with no new acute  complaints or concerns. No acute concerns or events noted overnight.  Objective: Vitals:   02/17/20 2100 02/18/20 0400 02/18/20 0405 02/18/20 0526  BP: (!)  141/86  (!) 160/97 (!) 147/87  Pulse: 61  65   Resp: 16  18   Temp: (!) 97.5 F (36.4 C)  98.1 F (36.7 C)   TempSrc: Oral  Oral   SpO2: 100%  100%   Weight:  69.3 kg    Height:        Intake/Output Summary (Last 24 hours) at 02/18/2020 1228 Last data filed at 02/18/2020 0853 Gross per 24 hour  Intake 480 ml  Output -  Net 480 ml   Filed Weights   02/16/20 0500 02/17/20 0414 02/18/20 0400  Weight: 68.9 kg 69.9 kg 69.3 kg    Examination:  General exam: Appears calm and comfortable  Respiratory system: Clear to auscultation. Respiratory effort normal. Cardiovascular system: S1 & S2 heard, RRR.  Gastrointestinal system: Abdomen is soft Central nervous system: Alert and awake Extremities: No edema Skin: No significant lesions noted Psychiatry: Flat affect.    Data Reviewed: I have personally reviewed following labs and imaging studies  CBC: Recent Labs  Lab 02/12/20 0059 02/18/20 0201  WBC 3.9* 3.7*  NEUTROABS 1.8 1.5*  HGB 10.5* 11.2*  HCT 32.1* 33.2*  MCV 88.7 88.8  PLT 134* 153   Basic Metabolic Panel: Recent Labs  Lab 02/12/20 0059 02/18/20 0201  NA 138 137  K 3.8 3.8  CL 101 98  CO2 28 29  GLUCOSE 115* 101*  BUN 23* 24*  CREATININE 1.14 1.36*  CALCIUM 9.0 9.2   GFR: Estimated Creatinine Clearance: 60.2 mL/min (A) (by C-G formula based on SCr of 1.36 mg/dL (H)). Liver Function Tests: Recent Labs  Lab 02/18/20 0201  AST 19  ALT 24  ALKPHOS 61  BILITOT 1.0  PROT 6.3*  ALBUMIN 3.2*   No results for input(s): LIPASE, AMYLASE in the last 168 hours. No results for input(s): AMMONIA in the last 168 hours. Coagulation Profile: No results for input(s): INR, PROTIME in the last 168 hours. Cardiac Enzymes: No results for input(s): CKTOTAL, CKMB, CKMBINDEX, TROPONINI in the last 168 hours. BNP (last 3 results) No results for input(s): PROBNP in the last 8760 hours. HbA1C: No results for input(s): HGBA1C in the last 72 hours. CBG: Recent Labs   Lab 02/16/20 2143 02/17/20 0803 02/17/20 1133 02/17/20 1643 02/18/20 0346  GLUCAP 134* 91 114* 96 89   Lipid Profile: No results for input(s): CHOL, HDL, LDLCALC, TRIG, CHOLHDL, LDLDIRECT in the last 72 hours. Thyroid Function Tests: No results for input(s): TSH, T4TOTAL, FREET4, T3FREE, THYROIDAB in the last 72 hours. Anemia Panel: No results for input(s): VITAMINB12, FOLATE, FERRITIN, TIBC, IRON, RETICCTPCT in the last 72 hours. Sepsis Labs: No results for input(s): PROCALCITON, LATICACIDVEN in the last 168 hours.  No results found for this or any previous visit (from the past 240 hour(s)).       Radiology Studies: No results found.      Scheduled Meds: . amLODipine  10 mg Oral Daily  . apixaban  5 mg Oral BID  . atorvastatin  80 mg Oral Daily  . hydrochlorothiazide  25 mg Oral Daily  . lisinopril  20 mg Oral Daily  . magnesium oxide  400 mg Oral BID  . metFORMIN  1,000 mg Oral BID WC  . multivitamin with minerals  1 tablet Oral Daily  . sodium chloride flush  3 mL Intravenous Q12H  LOS: 25 days    Time spent: 35 minutes    Chianne Byrns Hoover Brunette, DO [facility]  If 7PM-7AM, please contact night-coverage [email or web address] 02/18/2020, 12:28 PM

## 2020-02-19 LAB — BASIC METABOLIC PANEL
Anion gap: 9 (ref 5–15)
BUN: 25 mg/dL — ABNORMAL HIGH (ref 6–20)
CO2: 31 mmol/L (ref 22–32)
Calcium: 9.4 mg/dL (ref 8.9–10.3)
Chloride: 96 mmol/L — ABNORMAL LOW (ref 98–111)
Creatinine, Ser: 1.27 mg/dL — ABNORMAL HIGH (ref 0.61–1.24)
GFR, Estimated: >60 mL/min (ref >60)
Glucose, Bld: 127 mg/dL — ABNORMAL HIGH (ref 70–99)
Potassium: 3.5 mmol/L (ref 3.5–5.1)
Sodium: 136 mmol/L (ref 135–145)

## 2020-02-19 LAB — MAGNESIUM: Magnesium: 2 mg/dL (ref 1.7–2.4)

## 2020-02-19 NOTE — Progress Notes (Signed)
OT Cancellation Note  Patient Details Name: Kevin [name] MRN: [id number] DOB: 1965-12-14   Cancelled Treatment:    Reason Eval/Treat Not Completed: Other (comment) Pt eating lunch upon COTA arrival, pts mother present reporting that pt got his tray late. Will check back as time allows for OT session.   Pollyann Glen K., C[facility]/L [facility] [phone]   Barron Schmid 02/19/2020, 4:54 PM

## 2020-02-19 NOTE — Progress Notes (Signed)
PROGRESS NOTE    Kevin [name] [facility]  NOB:[id number] DOB: 02/07/1965 DOA: 01/23/2020 PCP: Medicine, [facility]   Brief Narrative:  [name] a 55 y.o.malewith a history of chronic combined HFrEF, stage III CKD, CVA, left eye blindness, T2DM, HTN who presented to the ED 1/12 with progressive weakness and increasing cognitive decline over the past month. Work up has shown no infectious etiology to acute metabolic encephalopathy, and neuroimaging has shown no stroke. He remains severely debilitated, so SNF disposition is pursued, complicated by uninsured status. Difficulty in placement persists.  Assessment & Plan:   Principal Problem:   Hypernatremia Active Problems:   Diabetes mellitus without complication (HCC)   History of CVA (cerebrovascular accident)   Essential hypertension   CKD (chronic kidney disease), stage III (HCC)   Chronic CHF (congestive heart failure) (HCC)   Acute hypernatremia   Hypernatremia-currently resolved -Recheck intermittently  Acute metabolic encephalopathy-improved; Likely related to recent stroke and now manifesting as new baseline which is quite impaired cognitively. Work up for reversible etiologies has been negative to date.  - Delirium precautions.  - Haldol prn agitation despite frequent redirection and family visitation.  Asymptomatic bacteriuria: No symptoms, UA negative, though Enterobacter grew in culture.  - No treatment indicated. Remains afebrile.  AKI on stage II CKD: Improved. Note CrCl now >60 ml/min. - Avoid nephrotoxins - Addedback thiazide since BP elevated and renal function stable. Recheck metabolic panel in AM.  History of CVA December 2021, HTN: Was discharged with home health though was unable to pay for these services and agency wouldn't accept cash. Residual right-sided weakness and dysphagia.  - Continue amlodipine, lisinopril, HCTZ. - Eliquis (due to low LVEF), statin - Continue PT/OT  efforts.  History of chronic combined HFrEF: Not on diuretics, appearing euvolemic at this time. Last TEE 01/07/2020 with LVEF 20-25%. - Monitor volume status. Remains euvolemic, no diuretic currently indicated. - Per neurology, can switch back to antiplatelet in lieu of eliquis once LVEF >30-35%. - Continue ACE inhibitor - Unable to start beta blocker due to bradycardia, 1st degree AV block on review of telemetry today. - No indication for diuretic at this time.  - Would likely benefit from spironolactone. Appears to have been discussed with cardiology at last hospitalization where cath was deferred due to poor adherence. Will recommend cardiology outpatient follow up and repeat echo.   T2DM: Recent HbA1c 5.8% indicating adequate control.  - CBGs consistently at inpatient goal. Will stop regular checks and SSI, restartedhome metformin.  Pancytopenia: Unclear etiology, remains stable - Recheck CBCw/diff in AM.  DVT prophylaxis:Eliquis Code Status:Full Family Communication:None at bedside, pt will call Disposition Plan: Status is: Inpatient  Remains inpatient appropriate because:Unsafe d/c plan  Dispo: The patient is from: Home Anticipated d/c is to: SNFonce medicaid and disability are approved. Hospital CSW in consistent communication with DSS medicaid CSW. Anticipated d/c date is: > 3 days Patient currently is medically stable to d/c. Difficult to place patient Yes  Consultants:  None  Procedures:  None  Antimicrobials:  None  Subjective: Patient seen and evaluated today with no new acute complaints or concerns, quite somnolent this am, but arousable. No acute concerns or events noted overnight.  Objective: Vitals:   02/18/20 0526 02/18/20 1515 02/18/20 2019 02/19/20 0500  BP: (!) 147/87 133/80 (!) 143/78 (!) 181/90  Pulse:  (!) 59 71 73  Resp:  16 18 18   Temp:  98.5 F (36.9 C) 98.1 F (36.7  C) 98.2 F (36.8 C)  TempSrc:  Oral  Axillary  SpO2:  100% 99% 98%  Weight:      Height:        Intake/Output Summary (Last 24 hours) at 02/19/2020 0709 Last data filed at 02/18/2020 2029 Gross per 24 hour  Intake 480 ml  Output 225 ml  Net 255 ml   Filed Weights   02/16/20 0500 02/17/20 0414 02/18/20 0400  Weight: 68.9 kg 69.9 kg 69.3 kg    Examination:  General exam: Appears calm and comfortable, somnolent Respiratory system: Clear to auscultation. Respiratory effort normal. Cardiovascular system: S1 & S2 heard, RRR.  Gastrointestinal system: Abdomen is soft Central nervous system: Alert and awake Extremities: No edema Skin: No significant lesions noted Psychiatry: Flat affect.    Data Reviewed: I have personally reviewed following labs and imaging studies  CBC: Recent Labs  Lab 02/18/20 0201  WBC 3.7*  NEUTROABS 1.5*  HGB 11.2*  HCT 33.2*  MCV 88.8  PLT 153   Basic Metabolic Panel: Recent Labs  Lab 02/18/20 0201 02/19/20 0120  NA 137 136  K 3.8 3.5  CL 98 96*  CO2 29 31  GLUCOSE 101* 127*  BUN 24* 25*  CREATININE 1.36* 1.27*  CALCIUM 9.2 9.4  MG  --  2.0   GFR: Estimated Creatinine Clearance: 64.4 mL/min (A) (by C-G formula based on SCr of 1.27 mg/dL (H)). Liver Function Tests: Recent Labs  Lab 02/18/20 0201  AST 19  ALT 24  ALKPHOS 61  BILITOT 1.0  PROT 6.3*  ALBUMIN 3.2*   No results for input(s): LIPASE, AMYLASE in the last 168 hours. No results for input(s): AMMONIA in the last 168 hours. Coagulation Profile: No results for input(s): INR, PROTIME in the last 168 hours. Cardiac Enzymes: No results for input(s): CKTOTAL, CKMB, CKMBINDEX, TROPONINI in the last 168 hours. BNP (last 3 results) No results for input(s): PROBNP in the last 8760 hours. HbA1C: No results for input(s): HGBA1C in the last 72 hours. CBG: Recent Labs  Lab 02/16/20 2143 02/17/20 0803 02/17/20 1133 02/17/20 1643 02/18/20 0346  GLUCAP 134* 91 114* 96 89    Lipid Profile: No results for input(s): CHOL, HDL, LDLCALC, TRIG, CHOLHDL, LDLDIRECT in the last 72 hours. Thyroid Function Tests: No results for input(s): TSH, T4TOTAL, FREET4, T3FREE, THYROIDAB in the last 72 hours. Anemia Panel: No results for input(s): VITAMINB12, FOLATE, FERRITIN, TIBC, IRON, RETICCTPCT in the last 72 hours. Sepsis Labs: No results for input(s): PROCALCITON, LATICACIDVEN in the last 168 hours.  No results found for this or any previous visit (from the past 240 hour(s)).       Radiology Studies: No results found.      Scheduled Meds: . amLODipine  10 mg Oral Daily  . apixaban  5 mg Oral BID  . atorvastatin  80 mg Oral Daily  . hydrochlorothiazide  25 mg Oral Daily  . lisinopril  20 mg Oral Daily  . magnesium oxide  400 mg Oral BID  . metFORMIN  1,000 mg Oral BID WC  . multivitamin with minerals  1 tablet Oral Daily  . sodium chloride flush  3 mL Intravenous Q12H    LOS: 26 days    Time spent: 35 minutes    Charmain Diosdado Hoover Brunette, DO [facility]  If 7PM-7AM, please contact night-coverage [email or web address] 02/19/2020, 7:09 AM

## 2020-02-19 NOTE — Progress Notes (Signed)
Physical Therapy Treatment Patient Details Name: Kevin [name] MRN: [id number] DOB: 13-Aug-1965 Today's Date: 02/19/2020    History of Present Illness Pt is a 55 y.o. male admitted 01/23/20 with weakness and generalized decline at home; workup for metabolic encephalopathy, hyponatremia, AKI on CKD. 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    PT Comments    Pt making gradual progress.  Updated goals.  Remains limited by cognition.  Pt with very flat affect, little carryover of cues to improve gait, and limited conversation during therapy (responds in 1 word phrases).  He participated with higher level balance activities with cues and demonstration but often need reminders to continue task.  Pt scored a 17/24 on DGI indicating fall risk - deficits were mainly related to decreased speed for task or drifting but no overt LOB.  Ambulated without AD.  Pt remains unsafe to be alone at home but mainly due to cognition.  Will continue PT 1xweek to address higher level balance.    Follow Up Recommendations  Other (comment) (Currently working on SNF placement; alternative could be ALF or LTC due to cognition)     Equipment Recommendations  Rolling walker with 5" wheels    Recommendations for Other Services       Precautions / Restrictions Precautions Precautions: Fall;Other (comment) Precaution Comments: L eye blindness    Mobility  Bed Mobility Overal bed mobility: Independent                Transfers Overall transfer level: Needs assistance Equipment used: None Transfers: Sit to/from Stand Sit to Stand: Supervision         General transfer comment: for safety  Ambulation/Gait Ambulation/Gait assistance: Supervision;Min guard Gait Distance (Feet): 400 Feet Assistive device: None Gait Pattern/deviations: Step-through pattern;Narrow base of support;Drifts right/left     General Gait Details: Supervision for  gait, min guard with dynamic task.  Cued for increased BOS but had difficulty maintaining   Stairs Stairs: Yes Stairs assistance: Min guard Stair Management: One rail Right;Alternating pattern Number of Stairs: 10 General stair comments: mild unsteadiness requiring min guard   Wheelchair Mobility    Modified Rankin (Stroke Patients Only) Modified Rankin (Stroke Patients Only) Pre-Morbid Rankin Score: No significant disability Modified Rankin: Moderate disability     Balance Overall balance assessment: Needs assistance Sitting-balance support: No upper extremity supported;Feet supported Sitting balance-Leahy Scale: Good     Standing balance support: No upper extremity supported;During functional activity Standing balance-Leahy Scale: Good Standing balance comment: Can static stand and ambulate without UE support               High Level Balance Comments: Performed Marching 20' without rail with min guard, partial tander 20' with min guard and rail, backward walking 8' with min guard.  Additionally performed DGI testing. Pt requiring demonstration and increase cues for these task. Standardized Balance Assessment Standardized Balance Assessment : Dynamic Gait Index   Dynamic Gait Index Level Surface: Mild Impairment Change in Gait Speed: Mild Impairment Gait with Horizontal Head Turns: Mild Impairment Gait with Vertical Head Turns: Mild Impairment Gait and Pivot Turn: Normal Step Over Obstacle: Mild Impairment Step Around Obstacles: Mild Impairment Steps: Mild Impairment Total Score: 17      Cognition Arousal/Alertness: Awake/alert Behavior During Therapy: Flat affect Overall Cognitive Status: Impaired/Different from baseline Area of Impairment: Awareness;Following commands;Attention;Problem solving;Memory;Safety/judgement;Orientation                 Orientation  Level: Disoriented to;Place;Time;Situation Current Attention Level: Sustained Memory:  Decreased short-term memory Following Commands: Follows one step commands with increased time;Follows one step commands consistently Safety/Judgement: Decreased awareness of safety;Decreased awareness of deficits Awareness: Intellectual Problem Solving: Slow processing;Requires verbal cues        Exercises      General Comments General comments (skin integrity, edema, etc.): DGI deficits related to decreased speed and mild drifting      Pertinent Vitals/Pain Pain Assessment: No/denies pain    Home Living                      Prior Function            PT Goals (current goals can now be found in the care plan section) Acute Rehab PT Goals Patient Stated Goal: Rehab at SNF before return home PT Goal Formulation: Patient unable to participate in goal setting Time For Goal Achievement: 03/04/20 Potential to Achieve Goals: Fair Additional Goals Additional Goal #1: Pt will score > 19 on DGI to indicate low fall risk Progress towards PT goals: Goals met and updated - see care plan    Frequency    Min 1X/week      PT Plan Current plan remains appropriate    Co-evaluation              AM-PAC PT "6 Clicks" Mobility   Outcome Measure  Help needed turning from your back to your side while in a flat bed without using bedrails?: None Help needed moving from lying on your back to sitting on the side of a flat bed without using bedrails?: None Help needed moving to and from a bed to a chair (including a wheelchair)?: A Little Help needed standing up from a chair using your arms (e.g., wheelchair or bedside chair)?: A Little Help needed to walk in hospital room?: A Little Help needed climbing 3-5 steps with a railing? : A Little 6 Click Score: 20    End of Session Equipment Utilized During Treatment: Gait belt Activity Tolerance: Patient tolerated treatment well Patient left: with call bell/phone within reach;in bed;with bed alarm set Nurse Communication:  Mobility status PT Visit Diagnosis: Unsteadiness on feet (R26.81);Muscle weakness (generalized) (M62.81)     Time: 2951-8841 PT Time Calculation (min) (ACUTE ONLY): 20 min  Charges:  $Neuromuscular Re-education: 8-22 mins                     Abran Richard, PT Acute Rehab Services Pager [phone] [facility] 02/19/2020, 5:58 PM

## 2020-02-20 ENCOUNTER — Telehealth (HOSPITAL_COMMUNITY): Payer: Self-pay

## 2020-02-20 DIAGNOSIS — R634 Abnormal weight loss: Secondary | ICD-10-CM

## 2020-02-20 LAB — CBC WITH DIFFERENTIAL/PLATELET
Abs Immature Granulocytes: 0.01 10*3/uL (ref 0.00–0.07)
Basophils Absolute: 0 10*3/uL (ref 0.0–0.1)
Basophils Relative: 0 %
Eosinophils Absolute: 0.1 10*3/uL (ref 0.0–0.5)
Eosinophils Relative: 3 %
HCT: 33.2 % — ABNORMAL LOW (ref 39.0–52.0)
Hemoglobin: 10.4 g/dL — ABNORMAL LOW (ref 13.0–17.0)
Immature Granulocytes: 0 %
Lymphocytes Relative: 49 %
Lymphs Abs: 2 10*3/uL (ref 0.7–4.0)
MCH: 28.4 pg (ref 26.0–34.0)
MCHC: 31.3 g/dL (ref 30.0–36.0)
MCV: 90.7 fL (ref 80.0–100.0)
Monocytes Absolute: 0.4 10*3/uL (ref 0.1–1.0)
Monocytes Relative: 9 %
Neutro Abs: 1.6 10*3/uL — ABNORMAL LOW (ref 1.7–7.7)
Neutrophils Relative %: 39 %
Platelets: 153 10*3/uL (ref 150–400)
RBC: 3.66 MIL/uL — ABNORMAL LOW (ref 4.22–5.81)
RDW: 12.4 % (ref 11.5–15.5)
WBC: 4.1 10*3/uL (ref 4.0–10.5)
nRBC: 0 % (ref 0.0–0.2)

## 2020-02-20 LAB — GLUCOSE, CAPILLARY: Glucose-Capillary: 164 mg/dL — ABNORMAL HIGH (ref 70–99)

## 2020-02-20 NOTE — Progress Notes (Signed)
Occupational Therapy Treatment Patient Details Name: Kevin [name] MRN: [id number] DOB: [birth year] Today's Date: 02/20/2020    History of present illness Pt is a 55 y.o. male admitted 01/23/20 with weakness and generalized decline at home; workup for metabolic encephalopathy, hyponatremia, AKI on CKD. 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   OT comments  Focus of session on ADL training--toileting, grooming at sink and dressing. Worked on cognitive task of pt calling service response line for his meal. Changed pt's linens, condom cath off upon entry and pt in urine, but did not call for assistance.   Follow Up Recommendations  SNF;Supervision/Assistance - 24 hour    Equipment Recommendations  None recommended by OT    Recommendations for Other Services      Precautions / Restrictions Precautions Precautions: Fall Precaution Comments: L eye blindness       Mobility Bed Mobility Overal bed mobility: Independent                Transfers Overall transfer level: Needs assistance Equipment used: None Transfers: Sit to/from Stand Sit to Stand: Supervision         General transfer comment: for safety, does not attend to condom cath    Balance Overall balance assessment: Needs assistance Sitting-balance support: No upper extremity supported;Feet supported Sitting balance-Leahy Scale: Good       Standing balance-Leahy Scale: Good                             ADL either performed or assessed with clinical judgement   ADL Overall ADL's : Needs assistance/impaired Eating/Feeding: Independent;Sitting   Grooming: Brushing hair;Standing;Supervision/safety           Upper Body Dressing : Sitting;Set up Upper Body Dressing Details (indicate cue type and reason): changed gown Lower Body Dressing: Set up;Sitting/lateral leans Lower Body Dressing Details (indicate cue type and reason):  donned socks Toilet Transfer: Supervision/safety;Ambulation Toilet Transfer Details (indicate cue type and reason): to urinate in standing Toileting- Clothing Manipulation and Hygiene: Supervision/safety (standing)       Functional mobility during ADLs: Supervision/safety General ADL Comments: Worked on pt placing his own meal order.     Vision       Perception     Praxis      Cognition Arousal/Alertness: Awake/alert Behavior During Therapy: Flat affect Overall Cognitive Status: Impaired/Different from baseline Area of Impairment: Awareness;Following commands;Attention;Problem solving;Memory;Safety/judgement;Orientation                 Orientation Level: Disoriented to;Place;Time;Situation Current Attention Level: Sustained Memory: Decreased short-term memory Following Commands: Follows one step commands with increased time;Follows one step commands consistently Safety/Judgement: Decreased awareness of safety;Decreased awareness of deficits Awareness: Intellectual Problem Solving: Slow processing;Requires verbal cues General Comments: condom cath off, pt in wet bed, but did not call for assist        Exercises     Shoulder Instructions       General Comments      Pertinent Vitals/ Pain       Pain Assessment: No/denies pain  Home Living                                          Prior Functioning/Environment              Frequency  Min 2X/week        Progress Toward Goals  OT Goals(current goals can now be found in the care plan section)  Progress towards OT goals: Progressing toward goals  Acute Rehab OT Goals Patient Stated Goal: Rehab at SNF before return home OT Goal Formulation: With patient Time For Goal Achievement: 02/22/20 Potential to Achieve Goals: Fair  Plan Discharge plan remains appropriate    Co-evaluation                 AM-PAC OT "6 Clicks" Daily Activity     Outcome Measure   Help from  another person eating meals?: None Help from another person taking care of personal grooming?: A Little Help from another person toileting, which includes using toliet, bedpan, or urinal?: A Little Help from another person bathing (including washing, rinsing, drying)?: A Little Help from another person to put on and taking off regular upper body clothing?: None Help from another person to put on and taking off regular lower body clothing?: A Little 6 Click Score: 20    End of Session    OT Visit Diagnosis: Unsteadiness on feet (R26.81);Cognitive communication deficit (R41.841);Low vision, both eyes (H54.2);Other symptoms and signs involving cognitive function;Muscle weakness (generalized) (M62.81);Other abnormalities of gait and mobility (R26.89) Symptoms and signs involving cognitive functions: Cerebral infarction   Activity Tolerance Patient tolerated treatment well   Patient Left in bed;with call bell/phone within reach;with bed alarm set   Nurse Communication Other (comment) (RN aware condom cath is off)        Time: 0981-1914 OT Time Calculation (min): 26 min  Charges: OT General Charges $OT Visit: 1 Visit OT Treatments $Self Care/Home Management : 23-37 mins  Martie Round, OTR/L Acute Rehabilitation Services Pager: [phone] Office: [phone]   Evern Bio 02/20/2020, 2:54 PM

## 2020-02-20 NOTE — Progress Notes (Signed)
PROGRESS NOTE    Kevin [name] [facility]   DDU:[id number]  DOB: 1965-03-04  DOA: 01/23/2020 PCP: Medicine, [facility]   Brief Narrative:  Kevin [name] is a 55 y.o.malewith a history of chronic combined HFrEF, stage III CKD, CVA, left eye blindness, T2DM, HTN who presented to the ED 1/12 with progressive weakness and increasing cognitive decline over the past month. Work up has shown no infectious etiology to acute metabolic encephalopathy, and neuroimaging has shown no stroke. He remains severely debilitated, so SNF disposition is pursued, complicated by uninsured status.Difficulty in placement persists.  Subjective: No complaints    Assessment & Plan:   Principal Problem:   Hypernatremia, AKI on CKD 2 -Felt to be due to dehydration -Resolved without IV fluids  Active Problems: Acute metabolic encephalopathy -Suspected to be due to CVA -now failed to have chronic cognitive deficits-work-up for reversible etiologies has been negative -Follow for delirium    Diabetes mellitus without complication  -Hemoglobin A1c 5.8 -As CBGs have been well controlled, we have stopped checking them and have discontinued insulin sliding scale -Continue Metformin    History of CVA (cerebrovascular accident) -12/21 -Has residual right-sided weakness and dysphagia -Continue Eliquis and statin  Hypertension/chronic combined heart failure -EF of 20 to 25% per TEE on 12/21 -Cath was deferred due to his poor adherence with medications -Heart rate too low for beta-blocker-can continue amlodipine, lisinopril, HCTZ -Recommend repeat echo in a few months   Time spent in minutes: 35 DVT prophylaxis:  apixaban (ELIQUIS) tablet 5 mg   Code Status: Full code Family Communication:  Level of Care: Level of care: Telemetry Medical Disposition Plan:  Status is: Inpatient  Remains inpatient appropriate because:IV treatments appropriate due to intensity of illness or inability to take  PO   Dispo:  Patient From: Home  Planned Disposition: Skilled Nursing Facility  Expected discharge date: 02/25/2020  Medically stable for discharge: Yes        Consultants:   None Procedures:   None Antimicrobials:  Anti-infectives (From admission, onward)   None       Objective: Vitals:   02/19/20 1435 02/19/20 2100 02/20/20 0359 02/20/20 1135  BP: 135/81 114/73 134/89   Pulse: 63 66 66   Resp: 18 18 18    Temp: 98.2 F (36.8 C) 97.9 F (36.6 C) (!) 97.2 F (36.2 C)   TempSrc: Oral     SpO2: 100% 99% 100%   Weight:    68.8 kg  Height:        Intake/Output Summary (Last 24 hours) at 02/20/2020 1458 Last data filed at 02/20/2020 0401 Gross per 24 hour  Intake -  Output 700 ml  Net -700 ml   Filed Weights   02/17/20 0414 02/18/20 0400 02/20/20 1135  Weight: 69.9 kg 69.3 kg 68.8 kg    Examination: General exam: Appears comfortable  HEENT: PERRLA, oral mucosa moist, no sclera icterus or thrush Respiratory system: Clear to auscultation. Respiratory effort normal. Cardiovascular system: S1 & S2 heard, RRR.   Gastrointestinal system: Abdomen soft, non-tender, nondistended. Normal bowel sounds. Central nervous system: Alert and oriented. No focal neurological deficits. Extremities: No cyanosis, clubbing or edema Skin: No rashes or ulcers Psychiatry:  Mood & affect appropriate.     Data Reviewed: I have personally reviewed following labs and imaging studies  CBC: Recent Labs  Lab 02/18/20 0201 02/20/20 0135  WBC 3.7* 4.1  NEUTROABS 1.5* 1.6*  HGB 11.2* 10.4*  HCT 33.2* 33.2*  MCV 88.8 90.7  PLT 153 153   Basic Metabolic Panel: Recent Labs  Lab 02/18/20 0201 02/19/20 0120  NA 137 136  K 3.8 3.5  CL 98 96*  CO2 29 31  GLUCOSE 101* 127*  BUN 24* 25*  CREATININE 1.36* 1.27*  CALCIUM 9.2 9.4  MG  --  2.0   GFR: Estimated Creatinine Clearance: 64 mL/min (A) (by C-G formula based on SCr of 1.27 mg/dL (H)). Liver Function Tests: Recent Labs   Lab 02/18/20 0201  AST 19  ALT 24  ALKPHOS 61  BILITOT 1.0  PROT 6.3*  ALBUMIN 3.2*   No results for input(s): LIPASE, AMYLASE in the last 168 hours. No results for input(s): AMMONIA in the last 168 hours. Coagulation Profile: No results for input(s): INR, PROTIME in the last 168 hours. Cardiac Enzymes: No results for input(s): CKTOTAL, CKMB, CKMBINDEX, TROPONINI in the last 168 hours. BNP (last 3 results) No results for input(s): PROBNP in the last 8760 hours. HbA1C: No results for input(s): HGBA1C in the last 72 hours. CBG: Recent Labs  Lab 02/17/20 0803 02/17/20 1133 02/17/20 1643 02/18/20 0346 02/20/20 0841  GLUCAP 91 114* 96 89 164*   Lipid Profile: No results for input(s): CHOL, HDL, LDLCALC, TRIG, CHOLHDL, LDLDIRECT in the last 72 hours. Thyroid Function Tests: No results for input(s): TSH, T4TOTAL, FREET4, T3FREE, THYROIDAB in the last 72 hours. Anemia Panel: No results for input(s): VITAMINB12, FOLATE, FERRITIN, TIBC, IRON, RETICCTPCT in the last 72 hours. Urine analysis:    Component Value Date/Time   COLORURINE YELLOW 01/28/2020 0727   APPEARANCEUR HAZY (A) 01/28/2020 0727   LABSPEC 1.015 01/28/2020 0727   PHURINE 6.0 01/28/2020 0727   GLUCOSEU 50 (A) 01/28/2020 0727   HGBUR SMALL (A) 01/28/2020 0727   BILIRUBINUR NEGATIVE 01/28/2020 0727   KETONESUR 5 (A) 01/28/2020 0727   PROTEINUR NEGATIVE 01/28/2020 0727   NITRITE NEGATIVE 01/28/2020 0727   LEUKOCYTESUR NEGATIVE 01/28/2020 0727   Sepsis Labs: @LABRCNTIP (procalcitonin:4,lacticidven:4) )No results found for this or any previous visit (from the past 240 hour(s)).       Radiology Studies: No results found.    Scheduled Meds: . amLODipine  10 mg Oral Daily  . apixaban  5 mg Oral BID  . atorvastatin  80 mg Oral Daily  . hydrochlorothiazide  25 mg Oral Daily  . lisinopril  20 mg Oral Daily  . magnesium oxide  400 mg Oral BID  . metFORMIN  1,000 mg Oral BID WC  . multivitamin with  minerals  1 tablet Oral Daily  . sodium chloride flush  3 mL Intravenous Q12H   Continuous Infusions:   LOS: 27 days      , MD [facility] Pager: [email or web address] 02/20/2020, 2:58 PM

## 2020-02-20 NOTE — Telephone Encounter (Signed)
Contacted [employer] to follow up on patient assistance application submitted for Eliquis. He has been approved to receive Eliquis for $0 per 90-day supply from 02/11/20-02/09/21.   He will need to contact the [facility] pharmacy to set up his initial delivery of Eliquis (pending disposition plans). Their phone number is [phone]. The address they will deliver medications to is the home address listed on file.   Attempted to call his mother, [name], to also let her know of this information but she was unable to be reached.   Sharen Hones, PharmD, BCPS Heart Failure Stewardship Pharmacist Phone [phone]

## 2020-02-21 LAB — GLUCOSE, CAPILLARY
Glucose-Capillary: 83 mg/dL (ref 70–99)
Glucose-Capillary: 176 mg/dL — ABNORMAL HIGH (ref 70–99)
Glucose-Capillary: 207 mg/dL — ABNORMAL HIGH (ref 70–99)

## 2020-02-21 NOTE — Progress Notes (Signed)
PROGRESS NOTE    Lucas Winograd [facility]   DJM:[id number]  DOB: 1965/08/01  DOA: 01/23/2020 PCP: Medicine, [facility]   Brief Narrative:  Teddrick Mallari Sinagra is a 55 y.o.malewith a history of chronic combined HFrEF, stage III CKD, CVA, left eye blindness, T2DM, HTN who presented to the ED 1/12 with progressive weakness and increasing cognitive decline over the past month. Work up has shown no infectious etiology to acute metabolic encephalopathy, and neuroimaging has shown no stroke. He remains severely debilitated, so SNF disposition is pursued, complicated by uninsured status.Difficulty in placement persists.  Subjective: No complaints    Assessment & Plan:   Principal Problem:   Hypernatremia, AKI on CKD 2 -Felt to be due to dehydration -Resolved without IV fluids  Active Problems: Acute metabolic encephalopathy -Suspected to be due to CVA -now failed to have chronic cognitive deficits-work-up for reversible etiologies has been negative -Follow for delirium    Diabetes mellitus without complication  -Hemoglobin A1c 5.8 -As CBGs have been well controlled, we have stopped checking them and have discontinued insulin sliding scale -Continue Metformin    History of CVA (cerebrovascular accident) -12/21 -Has residual right-sided weakness and dysphagia -Continue Eliquis and statin  Hypertension/chronic combined heart failure -EF of 20 to 25% per TEE on 12/21 -Cath was deferred due to his poor adherence with medications -Heart rate too low for beta-blocker-can continue amlodipine, lisinopril, HCTZ -Recommend repeat echo in a few months   Time spent in minutes: 35 DVT prophylaxis:  apixaban (ELIQUIS) tablet 5 mg  Code Status: Full code Family Communication:  Level of Care: Level of care: Telemetry Medical Disposition Plan:  Status is: Inpatient  Remains inpatient appropriate because:IV treatments appropriate due to intensity of illness or inability to take  PO   Dispo:  Patient From: Home  Planned Disposition: Skilled Nursing Facility  Expected discharge date: 02/25/2020  Medically stable for discharge: Yes    Consultants:   None Procedures:   None Antimicrobials:  Anti-infectives (From admission, onward)   None       Objective: Vitals:   02/20/20 1135 02/20/20 1554 02/20/20 2135 02/21/20 0428  BP:  127/80 121/82 120/70  Pulse:  70 72 70  Resp:  18 18 18   Temp:  98.3 F (36.8 C) 98.6 F (37 C) 97.6 F (36.4 C)  TempSrc:  Oral    SpO2:  100% 100% 100%  Weight: 68.8 kg     Height:        Intake/Output Summary (Last 24 hours) at 02/21/2020 1417 Last data filed at 02/21/2020 0300 Gross per 24 hour  Intake --  Output 375 ml  Net -375 ml   Filed Weights   02/17/20 0414 02/18/20 0400 02/20/20 1135  Weight: 69.9 kg 69.3 kg 68.8 kg    Examination: General exam: Appears comfortable  HEENT: PERRLA, oral mucosa moist, no sclera icterus or thrush Respiratory system: Clear to auscultation. Respiratory effort normal. Cardiovascular system: S1 & S2 heard, RRR.   Gastrointestinal system: Abdomen soft, non-tender, nondistended. Normal bowel sounds. Central nervous system: Alert and oriented. No focal neurological deficits. Extremities: No cyanosis, clubbing or edema Skin: No rashes or ulcers Psychiatry:  Mood & affect appropriate.     Data Reviewed: I have personally reviewed following labs and imaging studies  CBC: Recent Labs  Lab 02/18/20 0201 02/20/20 0135  WBC 3.7* 4.1  NEUTROABS 1.5* 1.6*  HGB 11.2* 10.4*  HCT 33.2* 33.2*  MCV 88.8 90.7  PLT 153 153   Basic  Metabolic Panel: Recent Labs  Lab 02/18/20 0201 02/19/20 0120  NA 137 136  K 3.8 3.5  CL 98 96*  CO2 29 31  GLUCOSE 101* 127*  BUN 24* 25*  CREATININE 1.36* 1.27*  CALCIUM 9.2 9.4  MG  --  2.0   GFR: Estimated Creatinine Clearance: 64 mL/min (A) (by C-G formula based on SCr of 1.27 mg/dL (H)). Liver Function Tests: Recent Labs  Lab  02/18/20 0201  AST 19  ALT 24  ALKPHOS 61  BILITOT 1.0  PROT 6.3*  ALBUMIN 3.2*   No results for input(s): LIPASE, AMYLASE in the last 168 hours. No results for input(s): AMMONIA in the last 168 hours. Coagulation Profile: No results for input(s): INR, PROTIME in the last 168 hours. Cardiac Enzymes: No results for input(s): CKTOTAL, CKMB, CKMBINDEX, TROPONINI in the last 168 hours. BNP (last 3 results) No results for input(s): PROBNP in the last 8760 hours. HbA1C: No results for input(s): HGBA1C in the last 72 hours. CBG: Recent Labs  Lab 02/17/20 1643 02/18/20 0346 02/20/20 0841 02/21/20 0821 02/21/20 1224  GLUCAP 96 89 164* 83 176*   Lipid Profile: No results for input(s): CHOL, HDL, LDLCALC, TRIG, CHOLHDL, LDLDIRECT in the last 72 hours. Thyroid Function Tests: No results for input(s): TSH, T4TOTAL, FREET4, T3FREE, THYROIDAB in the last 72 hours. Anemia Panel: No results for input(s): VITAMINB12, FOLATE, FERRITIN, TIBC, IRON, RETICCTPCT in the last 72 hours. Urine analysis:    Component Value Date/Time   COLORURINE YELLOW 01/28/2020 0727   APPEARANCEUR HAZY (A) 01/28/2020 0727   LABSPEC 1.015 01/28/2020 0727   PHURINE 6.0 01/28/2020 0727   GLUCOSEU 50 (A) 01/28/2020 0727   HGBUR SMALL (A) 01/28/2020 0727   BILIRUBINUR NEGATIVE 01/28/2020 0727   KETONESUR 5 (A) 01/28/2020 0727   PROTEINUR NEGATIVE 01/28/2020 0727   NITRITE NEGATIVE 01/28/2020 0727   LEUKOCYTESUR NEGATIVE 01/28/2020 0727   Sepsis Labs: @LABRCNTIP (procalcitonin:4,lacticidven:4) )No results found for this or any previous visit (from the past 240 hour(s)).       Radiology Studies: No results found.    Scheduled Meds: . amLODipine  10 mg Oral Daily  . apixaban  5 mg Oral BID  . atorvastatin  80 mg Oral Daily  . hydrochlorothiazide  25 mg Oral Daily  . lisinopril  20 mg Oral Daily  . magnesium oxide  400 mg Oral BID  . metFORMIN  1,000 mg Oral BID WC  . multivitamin with minerals  1  tablet Oral Daily  . sodium chloride flush  3 mL Intravenous Q12H   Continuous Infusions:   LOS: 28 days      , MD [facility] Pager: [email or web address] 02/21/2020, 2:17 PM

## 2020-02-22 LAB — GLUCOSE, CAPILLARY
Glucose-Capillary: 159 mg/dL — ABNORMAL HIGH (ref 70–99)
Glucose-Capillary: 92 mg/dL (ref 70–99)
Glucose-Capillary: 122 mg/dL — ABNORMAL HIGH (ref 70–99)

## 2020-02-22 MED ORDER — INSULIN ASPART 100 UNIT/ML ~~LOC~~ SOLN
0.0000 [IU] | Freq: Three times a day (TID) | SUBCUTANEOUS | Status: DC
  Administered 2020-02-22 – 2020-02-24 (×2): 2 [IU] via SUBCUTANEOUS
  Administered 2020-02-25 – 2020-02-26 (×3): 1 [IU] via SUBCUTANEOUS

## 2020-02-22 NOTE — Progress Notes (Signed)
PROGRESS NOTE    Kevin [name] [facility]   RKY:[id number]  DOB: November 09, 1965  DOA: 01/23/2020 PCP: Medicine, [facility]   Brief Narrative:  Kevin [name] is a 55 y.o.malewith a history of chronic combined HFrEF, stage III CKD, CVA, left eye blindness, T2DM, HTN who presented to the ED 1/12 with progressive weakness and increasing cognitive decline over the past month. Work up has shown no infectious etiology to acute metabolic encephalopathy, and neuroimaging has shown no stroke. He remains severely debilitated, so SNF disposition is pursued, complicated by uninsured status.Difficulty in placement persists.  Subjective: The patient was seen and examined this morning, remained stable Awake this morning, following commands    Assessment & Plan:   Principal Problem:   Hypernatremia, AKI on CKD 2 -Felt to be due to dehydration -Resolved without IV fluids -Last creatinine 1.27, BUN 25  Active Problems: Acute metabolic encephalopathy -Suspected to be due to CVA -now failed to have chronic cognitive deficits-work-up for reversible etiologies has been negative -Follow for delirium -Mentation remained stable    Diabetes mellitus without complication  -Hemoglobin A1c 5.8 -Continue checking blood sugars q. ACH S, with SSI coverage -Continue Metformin    History of CVA (cerebrovascular accident) -12/21 -Has residual right-sided weakness and dysphagia -Continue Eliquis and statin -No changes remained stable  Hypertension/chronic combined heart failure -EF of 20 to 25% per TEE on 12/21 -Cath was deferred due to his poor adherence with medications -Heart rate too low for beta-blocker-can continue amlodipine, lisinopril, HCTZ -Recommend repeat echo in a few months -Remained stable   Time spent in minutes: 35 DVT prophylaxis:  apixaban (ELIQUIS) tablet 5 mg  Code Status: Full code Family Communication:  Level of Care: Level of care: Telemetry Medical Disposition  Plan:  Status is: Inpatient  Remains inpatient appropriate because:IV treatments appropriate due to intensity of illness or inability to take PO   Dispo:  Patient From: Home  Planned Disposition: Skilled Nursing Facility --pending approval  Expected discharge date: 02/25/2020  Medically stable for discharge: Yes    Consultants:   None Procedures:   None Antimicrobials:  Anti-infectives (From admission, onward)   None       Objective: Vitals:   02/21/20 1430 02/21/20 2024 02/22/20 0150 02/22/20 0515  BP:  132/75 (!) 151/88 (!) 145/82  Pulse:  70 61 65  Resp:  18 18 18   Temp:  98.1 F (36.7 C) 98.1 F (36.7 C) 98 F (36.7 C)  TempSrc:  Oral Oral Oral  SpO2:  100%  98%  Weight: 62.5 kg     Height:        Intake/Output Summary (Last 24 hours) at 02/22/2020 1255 Last data filed at 02/22/2020 0756 Gross per 24 hour  Intake 570 ml  Output 300 ml  Net 270 ml   Filed Weights   02/18/20 0400 02/20/20 1135 02/21/20 1430  Weight: 69.3 kg 68.8 kg 62.5 kg      Physical Exam:   General:  Alert, oriented, cooperative, no distress;   HEENT:  Normocephalic, PERRL, otherwise with in Normal limits   Neuro:  CNII-XII intact. , normal motor and sensation, reflexes intact   Lungs:   Clear to auscultation BL, Respirations unlabored, no wheezes / crackles  Cardio:    S1/S2, RRR, No murmure, No Rubs or Gallops   Abdomen:   Soft, non-tender, bowel sounds active all four quadrants,  no guarding or peritoneal signs.  Muscular skeletal:   Generalized weaknesses, right greater than left-sided weakness  Limited exam - in bed, able to move all 4 extremities, Normal strength,  2+ pulses,  symmetric, No pitting edema  Skin:  Dry, warm to touch, negative for any Rashes,  Wounds: Please see nursing documentation           Data Reviewed: I have personally reviewed following labs and imaging studies  CBC: Recent Labs  Lab 02/18/20 0201 02/20/20 0135  WBC 3.7* 4.1  NEUTROABS  1.5* 1.6*  HGB 11.2* 10.4*  HCT 33.2* 33.2*  MCV 88.8 90.7  PLT 153 153   Basic Metabolic Panel: Recent Labs  Lab 02/18/20 0201 02/19/20 0120  NA 137 136  K 3.8 3.5  CL 98 96*  CO2 29 31  GLUCOSE 101* 127*  BUN 24* 25*  CREATININE 1.36* 1.27*  CALCIUM 9.2 9.4  MG  --  2.0   GFR: Estimated Creatinine Clearance: 58.1 mL/min (A) (by C-G formula based on SCr of 1.27 mg/dL (H)). Liver Function Tests: Recent Labs  Lab 02/18/20 0201  AST 19  ALT 24  ALKPHOS 61  BILITOT 1.0  PROT 6.3*  ALBUMIN 3.2*   No results for input(s): LIPASE, AMYLASE in the last 168 hours. No results for input(s): AMMONIA in the last 168 hours. Coagulation Profile: No results for input(s): INR, PROTIME in the last 168 hours. Cardiac Enzymes: No results for input(s): CKTOTAL, CKMB, CKMBINDEX, TROPONINI in the last 168 hours. BNP (last 3 results) No results for input(s): PROBNP in the last 8760 hours. HbA1C: No results for input(s): HGBA1C in the last 72 hours. CBG: Recent Labs  Lab 02/18/20 0346 02/20/20 0841 02/21/20 0821 02/21/20 1224 02/21/20 1836  GLUCAP 89 164* 83 176* 207*   Urine analysis:    Component Value Date/Time   COLORURINE YELLOW 01/28/2020 0727   APPEARANCEUR HAZY (A) 01/28/2020 0727   LABSPEC 1.015 01/28/2020 0727   PHURINE 6.0 01/28/2020 0727   GLUCOSEU 50 (A) 01/28/2020 0727   HGBUR SMALL (A) 01/28/2020 0727   BILIRUBINUR NEGATIVE 01/28/2020 0727   KETONESUR 5 (A) 01/28/2020 0727   PROTEINUR NEGATIVE 01/28/2020 0727   NITRITE NEGATIVE 01/28/2020 0727   LEUKOCYTESUR NEGATIVE 01/28/2020 0727   Radiology Studies: No results found.    Scheduled Meds: . amLODipine  10 mg Oral Daily  . apixaban  5 mg Oral BID  . atorvastatin  80 mg Oral Daily  . hydrochlorothiazide  25 mg Oral Daily  . insulin aspart  0-9 Units Subcutaneous TID WC  . lisinopril  20 mg Oral Daily  . magnesium oxide  400 mg Oral BID  . metFORMIN  1,000 mg Oral BID WC  . multivitamin with  minerals  1 tablet Oral Daily  . sodium chloride flush  3 mL Intravenous Q12H   Continuous Infusions:   LOS: 29 days   Kendell Bane, MD [facility] Pager: [email or web address] 02/22/2020, 12:55 PM

## 2020-02-23 LAB — GLUCOSE, CAPILLARY
Glucose-Capillary: 84 mg/dL (ref 70–99)
Glucose-Capillary: 188 mg/dL — ABNORMAL HIGH (ref 70–99)
Glucose-Capillary: 119 mg/dL — ABNORMAL HIGH (ref 70–99)
Glucose-Capillary: 103 mg/dL — ABNORMAL HIGH (ref 70–99)

## 2020-02-23 NOTE — Progress Notes (Signed)
PROGRESS NOTE    Kevin [name] [facility]   ZOX:[id number]  DOB: [birth year]  DOA: 01/23/2020 PCP: Medicine, [facility]   Brief Narrative:  Kevin [name] is a 55 y.o.malewith a history of chronic combined HFrEF, stage III CKD, CVA, left eye blindness, T2DM, HTN who presented to the ED 1/12 with progressive weakness and increasing cognitive decline over the past month. Work up has shown no infectious etiology to acute metabolic encephalopathy, and neuroimaging has shown no stroke. He remains severely debilitated, so SNF disposition is pursued, complicated by uninsured status.Difficulty in placement persists.  Subjective: Patient was seen and examined, stable no issues overnight. No complaints    Assessment & Plan:   Principal Problem:   Hypernatremia, AKI on CKD 2 -Felt to be due to dehydration -Resolved without IV fluids... DC'd IV fluid -Last creatinine 1.27, BUN 25  Active Problems: Acute metabolic encephalopathy -Suspected to be due to CVA -now failed to have chronic cognitive deficits-work-up for reversible etiologies has been negative -Follow for delirium -Mentation remained stable    Diabetes mellitus without complication  -Hemoglobin A1c 5.8 -Continue checking blood sugars q. ACH S, with SSI coverage -Continue Metformin -CBG 92, 122, 84    History of CVA (cerebrovascular accident) -12/21 -Has residual right-sided weakness and dysphagia -Continue Eliquis and statin -Remained stable  Hypertension/chronic combined heart failure -EF of 20 to 25% per TEE on 12/21 -Cath was deferred due to his poor adherence with medications -Heart rate too low for beta-blocker-can continue amlodipine, lisinopril, HCTZ -Recommend repeat echo in a few months -Stable   Time spent in minutes: 35 DVT prophylaxis:  apixaban (ELIQUIS) tablet 5 mg  Code Status: Full code Family Communication:  Level of Care: Level of care: Telemetry Medical Disposition Plan:  Status is:  Inpatient    Dispo:  Patient From: Home  Planned Disposition: Skilled Nursing Facility --pending approval -delayed patient does not have  Insurance (unsafe to be discharged home -Per PT needing assist in 24 hours-recommending  SNF) Hospital CSW in consistent communication with DSS medicaid CSW   Expected discharge date: 02/25/2020  Medically stable for discharge: Yes    Consultants:   None Procedures:   None Antimicrobials:  Anti-infectives (From admission, onward)   None       Objective: Vitals:   02/22/20 0515 02/22/20 1447 02/22/20 1957 02/23/20 0440  BP: (!) 145/82 135/86 135/86 133/88  Pulse: 65 73 64 67  Resp: 18  18 17   Temp: 98 F (36.7 C) 98.7 F (37.1 C) 98.6 F (37 C) 97.7 F (36.5 C)  TempSrc: Oral  Oral Oral  SpO2: 98% 100% 100% 100%  Weight:      Height:        Intake/Output Summary (Last 24 hours) at 02/23/2020 1044 Last data filed at 02/23/2020 0730 Gross per 24 hour  Intake 300 ml  Output 500 ml  Net -200 ml   Filed Weights   02/18/20 0400 02/20/20 1135 02/21/20 1430  Weight: 69.3 kg 68.8 kg 62.5 kg        Physical Exam:   General:  Alert, cooperative, no distress;   HEENT:  Normocephalic, PERRL, otherwise with in Normal limits   Neuro:  CNII-XII intact. , normal motor and sensation, reflexes intact   Lungs:   Clear to auscultation BL, Respirations unlabored, no wheezes / crackles  Cardio:    S1/S2, RRR, No murmure, No Rubs or Gallops   Abdomen:   Soft, non-tender, bowel sounds active all four quadrants,  no guarding or peritoneal signs.  Muscular skeletal:   Generalized weaknesses, Limited exam - in bed, able to move all 4 extremities, Normal strength,  2+ pulses,  symmetric, No pitting edema  Skin:  Dry, warm to touch, negative for any Rashes,  Wounds: Please see nursing documentation          Data Reviewed: I have personally reviewed following labs and imaging studies  CBC: Recent Labs  Lab 02/18/20 0201 02/20/20 0135   WBC 3.7* 4.1  NEUTROABS 1.5* 1.6*  HGB 11.2* 10.4*  HCT 33.2* 33.2*  MCV 88.8 90.7  PLT 153 153   Basic Metabolic Panel: Recent Labs  Lab 02/18/20 0201 02/19/20 0120  NA 137 136  K 3.8 3.5  CL 98 96*  CO2 29 31  GLUCOSE 101* 127*  BUN 24* 25*  CREATININE 1.36* 1.27*  CALCIUM 9.2 9.4  MG  --  2.0   GFR: Estimated Creatinine Clearance: 58.1 mL/min (A) (by C-G formula based on SCr of 1.27 mg/dL (H)). Liver Function Tests: CBG: Recent Labs  Lab 02/21/20 1836 02/22/20 1442 02/22/20 1701 02/22/20 2052 02/23/20 0739  GLUCAP 207* 159* 92 122* 84   Urine analysis:    Component Value Date/Time   COLORURINE YELLOW 01/28/2020 0727   APPEARANCEUR HAZY (A) 01/28/2020 0727   LABSPEC 1.015 01/28/2020 0727   PHURINE 6.0 01/28/2020 0727   GLUCOSEU 50 (A) 01/28/2020 0727   HGBUR SMALL (A) 01/28/2020 0727   BILIRUBINUR NEGATIVE 01/28/2020 0727   KETONESUR 5 (A) 01/28/2020 0727   PROTEINUR NEGATIVE 01/28/2020 0727   NITRITE NEGATIVE 01/28/2020 0727   LEUKOCYTESUR NEGATIVE 01/28/2020 0727   Radiology Studies: No results found.    Scheduled Meds: . amLODipine  10 mg Oral Daily  . apixaban  5 mg Oral BID  . atorvastatin  80 mg Oral Daily  . hydrochlorothiazide  25 mg Oral Daily  . insulin aspart  0-9 Units Subcutaneous TID WC  . lisinopril  20 mg Oral Daily  . magnesium oxide  400 mg Oral BID  . metFORMIN  1,000 mg Oral BID WC  . multivitamin with minerals  1 tablet Oral Daily  . sodium chloride flush  3 mL Intravenous Q12H   Continuous Infusions:   LOS: 30 days   Kendell Bane, MD [facility] Pager: [email or web address] 02/23/2020, 10:44 AM

## 2020-02-24 LAB — GLUCOSE, CAPILLARY
Glucose-Capillary: 168 mg/dL — ABNORMAL HIGH (ref 70–99)
Glucose-Capillary: 60 mg/dL — ABNORMAL LOW (ref 70–99)
Glucose-Capillary: 80 mg/dL (ref 70–99)
Glucose-Capillary: 125 mg/dL — ABNORMAL HIGH (ref 70–99)
Glucose-Capillary: 118 mg/dL — ABNORMAL HIGH (ref 70–99)

## 2020-02-24 MED ORDER — LISINOPRIL 40 MG PO TABS
40.0000 mg | ORAL_TABLET | Freq: Every day | ORAL | Status: DC
  Administered 2020-02-25 – 2020-03-26 (×30): 40 mg via ORAL
  Filled 2020-02-24 (×32): qty 1

## 2020-02-24 MED ORDER — ENALAPRILAT 1.25 MG/ML IV SOLN
0.6250 mg | Freq: Once | INTRAVENOUS | Status: AC
  Administered 2020-02-24: 0.625 mg via INTRAVENOUS
  Filled 2020-02-24: qty 0.5

## 2020-02-24 NOTE — Progress Notes (Addendum)
PROGRESS NOTE    Kevin [name] [facility]   YQM:[id number]  DOB: 1965-11-07  DOA: 01/23/2020 PCP: Medicine, [facility]   Brief Narrative:  Kevin [name] is a 55 y.o.malewith a history of chronic combined HFrEF, stage III CKD, CVA, left eye blindness, T2DM, HTN who presented to the ED 1/12 with progressive weakness and increasing cognitive decline over the past month. Work up has shown no infectious etiology to acute metabolic encephalopathy, and neuroimaging has shown no stroke. He remains severely debilitated, so SNF disposition is pursued, complicated by uninsured status.Difficulty in placement persists.  Subjective: Patient was seen and examined this morning, remained stable no acute distress no issues overnight    Assessment & Plan:   Principal Problem:    Hypernatremia, AKI on CKD 2 -Likely due to dehydration-improved -Resolved without IV fluids... DC'd IV fluid -Last creatinine 1.27, BUN 25  Active Problems:  Acute metabolic encephalopathy -Suspected to be due to CVA -now failed to have chronic cognitive deficits-work-up for reversible etiologies has been negative -Follow for delirium -Improved mentation back to baseline    Diabetes mellitus without complication  -Hemoglobin A1c 5.8 -Continue checking blood sugars q. ACH S, with SSI coverage -Continue Metformin -CBG 103, 168     History of CVA (cerebrovascular accident) -12/21 -Has residual right-sided weakness and dysphagia -Continue Eliquis and statin -No new focal neurological findings, stable  Hypertension/chronic combined heart failure -EF of 20 to 25% per TEE on 12/21 -Cath was deferred due to his poor adherence with medications -Heart rate too low for beta-blocker-can continue amlodipine, lisinopril, HCTZ -Recommend repeat echo in a few months -Elevated blood pressure, 160/80, monitor post p.o. meds this morning -Increasing lisinopril from 20 to 40 mg daily   Time spent in minutes:  35 DVT prophylaxis:  apixaban (ELIQUIS) tablet 5 mg  Code Status: Full code Family Communication:  Level of Care: Level of care: Telemetry Medical Disposition Plan:  Status is: Inpatient    Dispo:  Patient From: Home  Planned Disposition: Skilled Nursing Facility --pending approval -delayed patient does not have  Insurance (unsafe to be discharged home -Per PT needing assist in 24 hours-recommending  SNF) Hospital CSW in consistent communication with DSS medicaid CSW   Expected discharge date: 02/25/2020  Medically stable for discharge: Yes    Consultants:   None Procedures:   None Antimicrobials:  Anti-infectives (From admission, onward)   None       Objective: Vitals:   02/24/20 0112 02/24/20 0227 02/24/20 0524 02/24/20 0651  BP: (!) 170/87 (!) 157/82 (!) 166/90 (!) 160/80  Pulse:   (!) 50 (!) 58  Resp:   16   Temp:   98.4 F (36.9 C)   TempSrc:   Oral   SpO2:   100%   Weight:      Height:        Intake/Output Summary (Last 24 hours) at 02/24/2020 1051 Last data filed at 02/24/2020 0000 Gross per 24 hour  Intake 380 ml  Output 300 ml  Net 80 ml   Filed Weights   02/18/20 0400 02/20/20 1135 02/21/20 1430  Weight: 69.3 kg 68.8 kg 62.5 kg     Physical Exam:   General:  Alert, oriented, cooperative, no distress;   HEENT:  Normocephalic, PERRL, otherwise with in Normal limits   Neuro:  CNII-XII intact. , normal motor and sensation, reflexes intact   Lungs:   Clear to auscultation BL, Respirations unlabored, no wheezes / crackles  Cardio:    S1/S2,  RRR, No murmure, No Rubs or Gallops   Abdomen:   Soft, non-tender, bowel sounds active all four quadrants,  no guarding or peritoneal signs.  Muscular skeletal:   Severe generalized weaknesses, Limited exam - in bed, able to move all 4 extremities, Normal strength,  2+ pulses,  symmetric, No pitting edema  Skin:  Dry, warm to touch, negative for any Rashes,  Wounds: Please see nursing documentation             Data Reviewed: I have personally reviewed following labs and imaging studies  CBC: Recent Labs  Lab 02/18/20 0201 02/20/20 0135  WBC 3.7* 4.1  NEUTROABS 1.5* 1.6*  HGB 11.2* 10.4*  HCT 33.2* 33.2*  MCV 88.8 90.7  PLT 153 153   Basic Metabolic Panel: Recent Labs  Lab 02/18/20 0201 02/19/20 0120  NA 137 136  K 3.8 3.5  CL 98 96*  CO2 29 31  GLUCOSE 101* 127*  BUN 24* 25*  CREATININE 1.36* 1.27*  CALCIUM 9.2 9.4  MG  --  2.0   GFR: Estimated Creatinine Clearance: 58.1 mL/min (A) (by C-G formula based on SCr of 1.27 mg/dL (H)). Liver Function Tests: CBG: Recent Labs  Lab 02/23/20 0739 02/23/20 1205 02/23/20 1713 02/23/20 2048 02/24/20 0823  GLUCAP 84 188* 119* 103* 168*   Urine analysis:    Component Value Date/Time   COLORURINE YELLOW 01/28/2020 0727   APPEARANCEUR HAZY (A) 01/28/2020 0727   LABSPEC 1.015 01/28/2020 0727   PHURINE 6.0 01/28/2020 0727   GLUCOSEU 50 (A) 01/28/2020 0727   HGBUR SMALL (A) 01/28/2020 0727   BILIRUBINUR NEGATIVE 01/28/2020 0727   KETONESUR 5 (A) 01/28/2020 0727   PROTEINUR NEGATIVE 01/28/2020 0727   NITRITE NEGATIVE 01/28/2020 0727   LEUKOCYTESUR NEGATIVE 01/28/2020 0727   Radiology Studies: No results found.    Scheduled Meds: . amLODipine  10 mg Oral Daily  . apixaban  5 mg Oral BID  . atorvastatin  80 mg Oral Daily  . hydrochlorothiazide  25 mg Oral Daily  . insulin aspart  0-9 Units Subcutaneous TID WC  . lisinopril  40 mg Oral Daily  . magnesium oxide  400 mg Oral BID  . metFORMIN  1,000 mg Oral BID WC  . multivitamin with minerals  1 tablet Oral Daily  . sodium chloride flush  3 mL Intravenous Q12H   Continuous Infusions:   LOS: 31 days   Kendell Bane, MD [facility] Pager: [email or web address] 02/24/2020, 10:51 AM

## 2020-02-25 LAB — CBC WITH DIFFERENTIAL/PLATELET
Abs Immature Granulocytes: 0.01 10*3/uL (ref 0.00–0.07)
Basophils Absolute: 0 10*3/uL (ref 0.0–0.1)
Basophils Relative: 0 %
Eosinophils Absolute: 0.1 10*3/uL (ref 0.0–0.5)
Eosinophils Relative: 2 %
HCT: 32.6 % — ABNORMAL LOW (ref 39.0–52.0)
Hemoglobin: 10.7 g/dL — ABNORMAL LOW (ref 13.0–17.0)
Immature Granulocytes: 0 %
Lymphocytes Relative: 43 %
Lymphs Abs: 1.4 10*3/uL (ref 0.7–4.0)
MCH: 29.7 pg (ref 26.0–34.0)
MCHC: 32.8 g/dL (ref 30.0–36.0)
MCV: 90.6 fL (ref 80.0–100.0)
Monocytes Absolute: 0.3 10*3/uL (ref 0.1–1.0)
Monocytes Relative: 10 %
Neutro Abs: 1.5 10*3/uL — ABNORMAL LOW (ref 1.7–7.7)
Neutrophils Relative %: 45 %
Platelets: 148 10*3/uL — ABNORMAL LOW (ref 150–400)
RBC: 3.6 MIL/uL — ABNORMAL LOW (ref 4.22–5.81)
RDW: 12.6 % (ref 11.5–15.5)
WBC: 3.4 10*3/uL — ABNORMAL LOW (ref 4.0–10.5)
nRBC: 0 % (ref 0.0–0.2)

## 2020-02-25 LAB — BASIC METABOLIC PANEL
Anion gap: 7 (ref 5–15)
BUN: 23 mg/dL — ABNORMAL HIGH (ref 6–20)
CO2: 30 mmol/L (ref 22–32)
Calcium: 9.2 mg/dL (ref 8.9–10.3)
Chloride: 100 mmol/L (ref 98–111)
Creatinine, Ser: 1.27 mg/dL — ABNORMAL HIGH (ref 0.61–1.24)
GFR, Estimated: >60 mL/min (ref >60)
Glucose, Bld: 91 mg/dL (ref 70–99)
Potassium: 3.6 mmol/L (ref 3.5–5.1)
Sodium: 137 mmol/L (ref 135–145)

## 2020-02-25 LAB — GLUCOSE, CAPILLARY
Glucose-Capillary: 149 mg/dL — ABNORMAL HIGH (ref 70–99)
Glucose-Capillary: 73 mg/dL (ref 70–99)
Glucose-Capillary: 138 mg/dL — ABNORMAL HIGH (ref 70–99)
Glucose-Capillary: 150 mg/dL — ABNORMAL HIGH (ref 70–99)

## 2020-02-25 LAB — MAGNESIUM: Magnesium: 2 mg/dL (ref 1.7–2.4)

## 2020-02-25 NOTE — Progress Notes (Signed)
PROGRESS NOTE    Kevin [name] [facility]   RUE:[id number]  DOB: Mar 08, 1965  DOA: 01/23/2020 PCP: Medicine, [facility]   Brief Narrative:  Kevin [name] Kevin [name] is a 55 y.o.malewith a history of chronic combined HFrEF, stage III CKD, CVA, left eye blindness, T2DM, HTN who presented to the ED 1/12 with progressive weakness and increasing cognitive decline over the past month. Work up has shown no infectious etiology to acute metabolic encephalopathy, and neuroimaging has shown no stroke. He remains severely debilitated, so SNF disposition is pursued, complicated by uninsured status.Difficulty in placement persists.  Subjective: Patient seen and examined.  He has no complaints.  He is very comfortable.  Very slow in response.  Alert but oriented x1 only.  Assessment & Plan:   Principal Problem:    Hypernatremia, AKI on CKD 2 -Likely due to dehydration-improved -Resolved without IV fluids... DC'd IV fluid -Last creatinine 1.27, BUN 25.  BMP was ordered for today but labs are still not collected.  Active Problems:  Acute metabolic encephalopathy -Suspected to be due to CVA -now presumed to have chronic cognitive deficits-work-up for reversible etiologies has been negative -Improved mentation back to baseline    Diabetes mellitus without complication  -Hemoglobin A1c 5.8 -Continue checking blood sugars q. ACH S, with SSI coverage -We will discontinue Metformin.    History of CVA (cerebrovascular accident) -12/21 -Has residual right-sided weakness and dysphagia -Continue Eliquis and statin -No new focal neurological findings, stable  Hypertension/chronic combined heart failure -EF of 20 to 25% per TEE on 12/21 -Cath was deferred due to his poor adherence with medications -Heart rate too low for beta-blocker-can continue amlodipine, lisinopril, HCTZ -Recommend repeat echo in a few months -Increased lisinopril from 20 to 40 mg daily  Time spent in minutes: 34 DVT  prophylaxis:  apixaban (ELIQUIS) tablet 5 mg  Code Status: Full code Family Communication: None present Level of Care: Level of care: Telemetry Medical Disposition Plan:  Status is: Inpatient    Dispo:  Patient From: Home  Planned Disposition: Skilled Nursing Facility --pending approval -delayed patient does not have  Insurance (unsafe to be discharged home -Per PT needing assist in 24 hours-recommending  SNF) Hospital CSW in consistent communication with DSS medicaid CSW   Expected discharge date: 03/03/2020  Medically stable for discharge: Yes    Consultants:   None Procedures:   None Antimicrobials:  Anti-infectives (From admission, onward)   None       Objective: Vitals:   02/24/20 0524 02/24/20 0651 02/24/20 2039 02/25/20 0439  BP: (!) 166/90 (!) 160/80 (!) 159/82 106/77  Pulse: (!) 50 (!) 58 (!) 59 65  Resp: 16  17 17   Temp: 98.4 F (36.9 C)  98.1 F (36.7 C) (!) 97.5 F (36.4 C)  TempSrc: Oral  Oral   SpO2: 100%  100% 99%  Weight:    69 kg  Height:        Intake/Output Summary (Last 24 hours) at 02/25/2020 1125 Last data filed at 02/25/2020 0755 Gross per 24 hour  Intake 600 ml  Output 325 ml  Net 275 ml   Filed Weights   02/20/20 1135 02/21/20 1430 02/25/20 0439  Weight: 68.8 kg 62.5 kg 69 kg   General exam: Appears calm and comfortable  Respiratory system: Clear to auscultation. Respiratory effort normal. Cardiovascular system: S1 & S2 heard, RRR. No JVD, murmurs, rubs, gallops or clicks. No pedal edema. Gastrointestinal system: Abdomen is nondistended, soft and nontender. No organomegaly or masses felt.  Normal bowel sounds heard. Central nervous system: Alert and oriented x1. Skin: No rashes, lesions or ulcers.  Psychiatry: Judgement and insight appear poor  Data Reviewed: I have personally reviewed following labs and imaging studies  CBC: Recent Labs  Lab 02/20/20 0135  WBC 4.1  NEUTROABS 1.6*  HGB 10.4*  HCT 33.2*  MCV 90.7  PLT 153    Basic Metabolic Panel: Recent Labs  Lab 02/19/20 0120  NA 136  K 3.5  CL 96*  CO2 31  GLUCOSE 127*  BUN 25*  CREATININE 1.27*  CALCIUM 9.4  MG 2.0   GFR: Estimated Creatinine Clearance: 64.1 mL/min (A) (by C-G formula based on SCr of 1.27 mg/dL (H)). Liver Function Tests: CBG: Recent Labs  Lab 02/24/20 1155 02/24/20 1225 02/24/20 1728 02/24/20 2042 02/25/20 0734  GLUCAP 60* 80 125* 118* 149*   Urine analysis:    Component Value Date/Time   COLORURINE YELLOW 01/28/2020 0727   APPEARANCEUR HAZY (A) 01/28/2020 0727   LABSPEC 1.015 01/28/2020 0727   PHURINE 6.0 01/28/2020 0727   GLUCOSEU 50 (A) 01/28/2020 0727   HGBUR SMALL (A) 01/28/2020 0727   BILIRUBINUR NEGATIVE 01/28/2020 0727   KETONESUR 5 (A) 01/28/2020 0727   PROTEINUR NEGATIVE 01/28/2020 0727   NITRITE NEGATIVE 01/28/2020 0727   LEUKOCYTESUR NEGATIVE 01/28/2020 0727   Radiology Studies: No results found.    Scheduled Meds: . amLODipine  10 mg Oral Daily  . apixaban  5 mg Oral BID  . atorvastatin  80 mg Oral Daily  . hydrochlorothiazide  25 mg Oral Daily  . insulin aspart  0-9 Units Subcutaneous TID WC  . lisinopril  40 mg Oral Daily  . magnesium oxide  400 mg Oral BID  . metFORMIN  1,000 mg Oral BID WC  . multivitamin with minerals  1 tablet Oral Daily  . sodium chloride flush  3 mL Intravenous Q12H   Continuous Infusions:   LOS: 32 days   Hughie Closs, MD [facility] Pager: [email or web address] 02/25/2020, 11:25 AM

## 2020-02-25 NOTE — Plan of Care (Signed)

## 2020-02-26 LAB — GLUCOSE, CAPILLARY
Glucose-Capillary: 76 mg/dL (ref 70–99)
Glucose-Capillary: 90 mg/dL (ref 70–99)
Glucose-Capillary: 129 mg/dL — ABNORMAL HIGH (ref 70–99)
Glucose-Capillary: 175 mg/dL — ABNORMAL HIGH (ref 70–99)

## 2020-02-26 NOTE — Progress Notes (Signed)
Physical Therapy Treatment Patient Details Name: Kevin [name] MRN: [id number] DOB: [birth year] Today's Date: 02/26/2020    History of Present Illness Pt is a 55 y.o. male admitted 01/23/20 with weakness and generalized decline at home; workup for metabolic encephalopathy, hyponatremia, AKI on CKD. 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    PT Comments    Pt progressing towards goals, however, continues to present with cognitive tasks. Unable to recall any words when performing 3 word recall task. Noted instability when performing higher level balance tasks as well. Otherwise requiring min guard to supervision. Current recommendations appropriate. Will continue to follow acutely.     Follow Up Recommendations  Other (comment) (currently working on SNF placement. alternative could be ALF or LTC due to cognition)     Equipment Recommendations  Rolling walker with 5" wheels    Recommendations for Other Services       Precautions / Restrictions Precautions Precautions: Fall Precaution Comments: L eye blindness Restrictions Weight Bearing Restrictions: No    Mobility  Bed Mobility Overal bed mobility: Independent                  Transfers Overall transfer level: Needs assistance Equipment used: None Transfers: Sit to/from Stand Sit to Stand: Supervision         General transfer comment: supervision for safety  Ambulation/Gait Ambulation/Gait assistance: Supervision;Min guard Gait Distance (Feet): 300 Feet Assistive device: None Gait Pattern/deviations: Step-through pattern;Narrow base of support;Drifts right/left Gait velocity: Decreased   General Gait Details: Min guard when performing head turns. Easily distracted during mobility tasks.   Stairs             Wheelchair Mobility    Modified Rankin (Stroke Patients Only) Modified Rankin (Stroke Patients Only) Pre-Morbid Rankin  Score: No significant disability Modified Rankin: Moderately severe disability     Balance Overall balance assessment: Needs assistance Sitting-balance support: No upper extremity supported;Feet supported Sitting balance-Leahy Scale: Good     Standing balance support: No upper extremity supported;During functional activity Standing balance-Leahy Scale: Fair Standing balance comment: Can static stand and ambulate without UE support             High level balance activites: Braiding;Other (comment) (tandem walking) High Level Balance Comments: Pt requiring UE support when performing braiding tasks and tandem walking. Min guard to min A for safety.            Cognition Arousal/Alertness: Awake/alert Behavior During Therapy: Flat affect Overall Cognitive Status: Impaired/Different from baseline Area of Impairment: Awareness;Following commands;Attention;Problem solving;Memory;Safety/judgement                   Current Attention Level: Sustained Memory: Decreased short-term memory Following Commands: Follows one step commands with increased time;Follows one step commands consistently Safety/Judgement: Decreased awareness of safety;Decreased awareness of deficits Awareness: Intellectual Problem Solving: Slow processing;Requires verbal cues General Comments: Pt had urinated in bed, but had not called anyone. Pt unable to remember words after short period in 3 word recall task.      Exercises      General Comments        Pertinent Vitals/Pain Pain Assessment: No/denies pain    Home Living                      Prior Function            PT Goals (current goals can now be found in the  care plan section) Acute Rehab PT Goals Patient Stated Goal: none stated PT Goal Formulation: Patient unable to participate in goal setting Time For Goal Achievement: 03/04/20 Potential to Achieve Goals: Fair Progress towards PT goals: Progressing toward goals     Frequency    Min 1X/week      PT Plan Current plan remains appropriate    Co-evaluation              AM-PAC PT "6 Clicks" Mobility   Outcome Measure  Help needed turning from your back to your side while in a flat bed without using bedrails?: None Help needed moving from lying on your back to sitting on the side of a flat bed without using bedrails?: None Help needed moving to and from a bed to a chair (including a wheelchair)?: A Little Help needed standing up from a chair using your arms (e.g., wheelchair or bedside chair)?: A Little Help needed to walk in hospital room?: A Little Help needed climbing 3-5 steps with a railing? : A Little 6 Click Score: 20    End of Session Equipment Utilized During Treatment: Gait belt Activity Tolerance: Patient tolerated treatment well Patient left: in chair;with call bell/phone within reach;with chair alarm set Nurse Communication: Mobility status PT Visit Diagnosis: Unsteadiness on feet (R26.81);Muscle weakness (generalized) (M62.81)     Time: 3664-4034 PT Time Calculation (min) (ACUTE ONLY): 12 min  Charges:  $Gait Training: 8-22 mins                     Cindee Salt, DPT  Acute Rehabilitation Services  Pager: [phone] Office: [phone]    Lehman Prom 02/26/2020, 3:40 PM

## 2020-02-26 NOTE — Progress Notes (Signed)
PROGRESS NOTE    Kevin [name] [facility]   PYK:[id number]  DOB: 04-05-1965  DOA: 01/23/2020 PCP: Medicine, [facility]   Brief Narrative:  Kevin [name] is a 55 y.o.malewith a history of chronic combined HFrEF, stage III CKD, CVA, left eye blindness, T2DM, HTN who presented to the ED 1/12 with progressive weakness and increasing cognitive decline over the past month. Work up has shown no infectious etiology to acute metabolic encephalopathy, and neuroimaging has shown no stroke. He remains severely debilitated, so SNF disposition is pursued, complicated by uninsured status.Difficulty in placement persists.  Subjective: Seen and examined.  No complaints.  He is alert but oriented x1 only.  Assessment & Plan:   Principal Problem:    Hypernatremia, AKI on CKD 2 -Likely due to dehydration-improved -Resolved without IV fluids... DC'd IV fluid -Last creatinine 1.27, BUN 25.  BMP was ordered for today but labs are still not collected.  Active Problems:  Acute metabolic encephalopathy -Suspected to be due to CVA -now presumed to have chronic cognitive deficits-work-up for reversible etiologies has been negative -Improved mentation back to baseline    Diabetes mellitus without complication  -Hemoglobin A1c 5.8 -Continue checking blood sugars q. [facility] S, with SSI coverage    History of CVA (cerebrovascular accident) -12/21 -Has residual right-sided weakness and dysphagia -Continue Eliquis and statin -No new focal neurological findings, stable  Hypertension/chronic combined heart failure -EF of 20 to 25% per TEE on 12/21 -Cath was deferred due to his poor adherence with medications -Heart rate too low for beta-blocker-can continue amlodipine, lisinopril, HCTZ -Recommend repeat echo in a few months -Increased lisinopril from 20 to 40 mg daily.  Blood pressure fairly controlled.  Time spent in minutes: 34 DVT prophylaxis:  apixaban (ELIQUIS) tablet 5 mg  Code Status:  Full code Family Communication: None present Level of Care: Level of care: Telemetry Medical Disposition Plan:  Status is: Inpatient    Dispo:  Patient From: Home  Planned Disposition: Skilled Nursing Facility --pending approval -delayed patient does not have  Insurance (unsafe to be discharged home -Per PT needing assist in 24 hours-recommending  SNF) Hospital CSW in consistent communication with DSS medicaid CSW   Expected discharge date: 03/03/2020  Medically stable for discharge: Yes    Consultants:   None Procedures:   None Antimicrobials:  Anti-infectives (From admission, onward)   None       Objective: Vitals:   02/25/20 0439 02/25/20 1351 02/25/20 2057 02/26/20 0551  BP: 106/77 105/68 99/72 112/83  Pulse: 65 63 81 (!) 58  Resp: 17  17 17   Temp: (!) 97.5 F (36.4 C) 98.8 F (37.1 C) 98.4 F (36.9 C) (!) 97.5 F (36.4 C)  TempSrc:  Oral Oral Oral  SpO2: 99% 99% 97% 100%  Weight: 69 kg   68.7 kg  Height:        Intake/Output Summary (Last 24 hours) at 02/26/2020 1018 Last data filed at 02/26/2020 1007 Gross per 24 hour  Intake 780 ml  Output 800 ml  Net -20 ml   Filed Weights   02/21/20 1430 02/25/20 0439 02/26/20 0551  Weight: 62.5 kg 69 kg 68.7 kg   General exam: Appears calm and comfortable  Respiratory system: Clear to auscultation. Respiratory effort normal. Cardiovascular system: S1 & S2 heard, RRR. No JVD, murmurs, rubs, gallops or clicks. No pedal edema. Gastrointestinal system: Abdomen is nondistended, soft and nontender. No organomegaly or masses felt. Normal bowel sounds heard. Central nervous system: Alert and oriented  x1. Extremities: Symmetric 5 x 5 power. Skin: No rashes, lesions or ulcers.  Psychiatry: Judgement and insight appear poor  Data Reviewed: I have personally reviewed following labs and imaging studies  CBC: Recent Labs  Lab 02/20/20 0135 02/25/20 1110  WBC 4.1 3.4*  NEUTROABS 1.6* 1.5*  HGB 10.4* 10.7*  HCT 33.2*  32.6*  MCV 90.7 90.6  PLT 153 148*   Basic Metabolic Panel: Recent Labs  Lab 02/25/20 1110  NA 137  K 3.6  CL 100  CO2 30  GLUCOSE 91  BUN 23*  CREATININE 1.27*  CALCIUM 9.2  MG 2.0   GFR: Estimated Creatinine Clearance: 63.9 mL/min (A) (by C-G formula based on SCr of 1.27 mg/dL (H)). Liver Function Tests: CBG: Recent Labs  Lab 02/25/20 0734 02/25/20 1146 02/25/20 1627 02/25/20 2059 02/26/20 0728  GLUCAP 149* 73 138* 150* 76   Urine analysis:    Component Value Date/Time   COLORURINE YELLOW 01/28/2020 0727   APPEARANCEUR HAZY (A) 01/28/2020 0727   LABSPEC 1.015 01/28/2020 0727   PHURINE 6.0 01/28/2020 0727   GLUCOSEU 50 (A) 01/28/2020 0727   HGBUR SMALL (A) 01/28/2020 0727   BILIRUBINUR NEGATIVE 01/28/2020 0727   KETONESUR 5 (A) 01/28/2020 0727   PROTEINUR NEGATIVE 01/28/2020 0727   NITRITE NEGATIVE 01/28/2020 0727   LEUKOCYTESUR NEGATIVE 01/28/2020 0727   Radiology Studies: No results found.    Scheduled Meds: . amLODipine  10 mg Oral Daily  . apixaban  5 mg Oral BID  . atorvastatin  80 mg Oral Daily  . hydrochlorothiazide  25 mg Oral Daily  . insulin aspart  0-9 Units Subcutaneous TID WC  . lisinopril  40 mg Oral Daily  . magnesium oxide  400 mg Oral BID  . multivitamin with minerals  1 tablet Oral Daily  . sodium chloride flush  3 mL Intravenous Q12H   Continuous Infusions:   LOS: 33 days   Hughie Closs, MD [facility] Pager: [email or web address] 02/26/2020, 10:18 AM

## 2020-02-26 NOTE — TOC Progression Note (Signed)
Transition of Care [facility]) - Progression Note    Patient Details  Name: Kevin [name] MRN: [id number] Date of Birth: 08-Jun-1965  Transition of Care [facility]) CM/SW Contact  Jimmy Picket, [place] Phone Number: 02/26/2020, 3:31 PM  Clinical Narrative:     CSW reached out to [facility] about an LOG SNF placement. [place] admission are still closed and are not expected to reopen until end of next week. There are no other updates at this time.   Expected Discharge Plan: Home w Home Health Services    Expected Discharge Plan and Services Expected Discharge Plan: Home w Home Health Services   Discharge Planning Services: CM Consult Post Acute Care Choice: Home Health,Durable Medical Equipment Living arrangements for the past 2 months: Single Family Home                           HH Arranged: OT,PT HH Agency: [facility] Health Care Date [facility] Agency Contacted: 01/28/20 Time HH Agency Contacted: 1425 Representative spoke with at [facility] Agency: Kandee Keen   Social Determinants of Health (SDOH) Interventions    Readmission Risk Interventions Readmission Risk Prevention Plan 01/08/2020  Transportation Screening Complete  PCP or Specialist Appt within 5-7 Days Complete  Home Care Screening Complete  Medication Review (RN CM) Complete  Some recent data might be hidden   Jimmy Picket, Theresia Majors, [facility] Clinical Social Worker [phone]

## 2020-02-27 LAB — GLUCOSE, CAPILLARY
Glucose-Capillary: 82 mg/dL (ref 70–99)
Glucose-Capillary: 121 mg/dL — ABNORMAL HIGH (ref 70–99)
Glucose-Capillary: 150 mg/dL — ABNORMAL HIGH (ref 70–99)

## 2020-02-27 NOTE — Progress Notes (Signed)
Occupational Therapy Treatment Patient Details Name: Kevin [name] MRN: [id number] DOB: 1965/07/12 Today's Date: 02/27/2020    History of present illness Pt is a 55 y.o. male admitted 01/23/20 with weakness and generalized decline at home; workup for metabolic encephalopathy, hyponatremia, AKI on CKD. 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   OT comments  PATIENT WAS SEEN FOR SKILLED OT TO MAXIMIZE I AND SAFETY WITH ADLS AND MOBILITY. PATIENT REQUIRED ENCOURAGEMENT TO PARTICIPATE IN THERAPY. PATIENT WAS S LEVEL TO AMB IN ROOM. PATIENT IS PROGRESSING WITH GOALS AND WILL BE FOLLOWED.   Follow Up Recommendations  SNF;Supervision/Assistance - 24 hour    Equipment Recommendations  None recommended by OT    Recommendations for Other Services      Precautions / Restrictions Precautions Precautions: Fall Precaution Comments: L eye blindness Restrictions Weight Bearing Restrictions: No       Mobility Bed Mobility Overal bed mobility: Independent                Transfers     Transfers: Sit to/from Stand Sit to Stand: Supervision Stand pivot transfers: Supervision       General transfer comment: S with safety.    Balance     Sitting balance-Leahy Scale: Good       Standing balance-Leahy Scale: Fair                             ADL either performed or assessed with clinical judgement   ADL Overall ADL's : Needs assistance/impaired Eating/Feeding: Independent;Sitting   Grooming: Wash/dry face;Wash/dry hands;Oral care;Supervision/safety               Lower Body Dressing: Set up;Sitting/lateral leans   Toilet Transfer: Supervision/safety;Ambulation           Functional mobility during ADLs: Supervision/safety General ADL Comments: Pt. required encouragment to particiapte     Vision   Additional Comments: Pt. is not cooperative with visual screen   Perception      Praxis      Cognition Arousal/Alertness: Awake/alert Behavior During Therapy: Flat affect Overall Cognitive Status: Impaired/Different from baseline Area of Impairment: Awareness;Following commands;Attention;Problem solving;Memory;Safety/judgement                 Orientation Level: Disoriented to;Situation   Memory: Decreased short-term memory Following Commands: Follows one step commands with increased time;Follows one step commands consistently Safety/Judgement: Decreased awareness of safety;Decreased awareness of deficits   Problem Solving: Slow processing;Requires verbal cues General Comments: Pt. requires cues to verbalize instead of shaking or nodding head.        Exercises     Shoulder Instructions       General Comments      Pertinent Vitals/ Pain       Pain Assessment: No/denies pain Pain Intervention(s): Monitored during session  Home Living                                          Prior Functioning/Environment              Frequency  Min 2X/week        Progress Toward Goals  OT Goals(current goals can now be found in the care plan section)  Progress towards OT goals: Progressing toward goals  Acute Rehab OT Goals Patient Stated Goal: none  stated OT Goal Formulation: With patient Time For Goal Achievement: 03/07/20 Potential to Achieve Goals: Fair ADL Goals Pt Will Perform Grooming: with modified independence;standing Pt Will Perform Upper Body Bathing: with set-up;sitting Pt Will Perform Lower Body Bathing: with supervision;sit to/from stand Pt Will Perform Upper Body Dressing: with set-up;sitting Pt Will Perform Lower Body Dressing: with supervision;sit to/from stand Pt Will Transfer to Toilet: with modified independence;ambulating;regular height toilet Pt Will Perform Toileting - Clothing Manipulation and hygiene: with supervision;sit to/from stand Additional ADL Goal #1: Pt will sustain attention to simple self  care activities x 5 mins with min cues.  Plan Discharge plan remains appropriate    Co-evaluation                 AM-PAC OT "6 Clicks" Daily Activity     Outcome Measure   Help from another person eating meals?: None Help from another person taking care of personal grooming?: A Little Help from another person toileting, which includes using toliet, bedpan, or urinal?: A Little Help from another person bathing (including washing, rinsing, drying)?: A Little Help from another person to put on and taking off regular upper body clothing?: None Help from another person to put on and taking off regular lower body clothing?: A Little 6 Click Score: 20    End of Session    OT Visit Diagnosis: Unsteadiness on feet (R26.81);Cognitive communication deficit (R41.841);Low vision, both eyes (H54.2);Other symptoms and signs involving cognitive function;Muscle weakness (generalized) (M62.81);Other abnormalities of gait and mobility (R26.89) Symptoms and signs involving cognitive functions: Cerebral infarction   Activity Tolerance Patient tolerated treatment well   Patient Left in chair;with call bell/phone within reach;with chair alarm set   Nurse Communication  (ok therapy)        Time: 9323-5573 OT Time Calculation (min): 35 min  Charges: OT General Charges $OT Visit: 1 Visit OT Treatments $Self Care/Home Management : 38-52 mins  Derrek Gu OT/L    Thadd Apuzzo 02/27/2020, 1:30 PM

## 2020-02-27 NOTE — Progress Notes (Signed)
PROGRESS NOTE    Jonah Gingras [facility]   NIO:[id number]  DOB: 1965-12-01  DOA: 01/23/2020 PCP: Medicine, [facility]   Brief Narrative:  Alejandra Barna Geno is a 55 y.o.malewith a history of chronic combined HFrEF, stage III CKD, CVA, left eye blindness, T2DM, HTN who presented to the ED 1/12 with progressive weakness and increasing cognitive decline over the past month. Work up has shown no infectious etiology to acute metabolic encephalopathy, and neuroimaging has shown no stroke. He remains severely debilitated, so SNF disposition is pursued, complicated by uninsured status.Difficulty in placement persists.  Subjective: Patient seen and examined.  He has no complaints.  Assessment & Plan:   Principal Problem:    Hypernatremia, AKI on CKD 2 -Likely due to dehydration-improved -Resolved without IV fluids... DC'd IV fluid -Last creatinine 1.27, BUN 25.  BMP was ordered for today but labs are still not collected.  Active Problems:  Acute metabolic encephalopathy -Suspected to be due to CVA -now presumed to have chronic cognitive deficits-work-up for reversible etiologies has been negative -Improved mentation back to baseline    Diabetes mellitus without complication  -Hemoglobin A1c 5.8 -Continue checking blood sugars q. [facility] S, with SSI coverage    History of CVA (cerebrovascular accident) -12/21 -Has residual right-sided weakness and dysphagia -Continue Eliquis and statin -No new focal neurological findings, stable  Hypertension/chronic combined heart failure -EF of 20 to 25% per TEE on 12/21 -Cath was deferred due to his poor adherence with medications -Heart rate too low for beta-blocker-can continue amlodipine, lisinopril, HCTZ -Recommend repeat echo in a few months -Increased lisinopril from 20 to 40 mg daily.  Blood pressure fairly controlled.  Time spent in minutes: 34 DVT prophylaxis:  apixaban (ELIQUIS) tablet 5 mg  Code Status: Full code Family  Communication: None present Level of Care: Level of care: Telemetry Medical Disposition Plan:  Status is: Inpatient    Dispo:  Patient From: Home  Planned Disposition: Skilled Nursing Facility --pending approval -delayed patient does not have  Insurance (unsafe to be discharged home -Per PT needing assist in 24 hours-recommending  SNF) Hospital CSW in consistent communication with DSS medicaid CSW   Expected discharge date: 03/03/2020  Medically stable for discharge: Yes    Consultants:   None Procedures:   None Antimicrobials:  Anti-infectives (From admission, onward)   None       Objective: Vitals:   02/26/20 1514 02/26/20 2116 02/27/20 0500 02/27/20 0535  BP: 125/74 121/80  123/86  Pulse: 76 69  (!) 59  Resp: 17 16  18   Temp: 98.2 F (36.8 C) 98.2 F (36.8 C)  (!) 97.3 F (36.3 C)  TempSrc: Oral Oral    SpO2: 100% 100%  100%  Weight:   67.7 kg   Height:        Intake/Output Summary (Last 24 hours) at 02/27/2020 1128 Last data filed at 02/26/2020 2310 Gross per 24 hour  Intake 357 ml  Output --  Net 357 ml   Filed Weights   02/25/20 0439 02/26/20 0551 02/27/20 0500  Weight: 69 kg 68.7 kg 67.7 kg   General exam: Appears calm and comfortable  Respiratory system: Clear to auscultation. Respiratory effort normal. Cardiovascular system: S1 & S2 heard, RRR. No JVD, murmurs, rubs, gallops or clicks. No pedal edema. Gastrointestinal system: Abdomen is nondistended, soft and nontender. No organomegaly or masses felt. Normal bowel sounds heard. Central nervous system: Alert and oriented x1 Extremities: Symmetric 5 x 5 power. Skin: No rashes, lesions  or ulcers.  Psychiatry: Judgement and insight appear poor  Data Reviewed: I have personally reviewed following labs and imaging studies  CBC: Recent Labs  Lab 02/25/20 1110  WBC 3.4*  NEUTROABS 1.5*  HGB 10.7*  HCT 32.6*  MCV 90.6  PLT 148*   Basic Metabolic Panel: Recent Labs  Lab 02/25/20 1110  NA 137   K 3.6  CL 100  CO2 30  GLUCOSE 91  BUN 23*  CREATININE 1.27*  CALCIUM 9.2  MG 2.0   GFR: Estimated Creatinine Clearance: 62.9 mL/min (A) (by C-G formula based on SCr of 1.27 mg/dL (H)). Liver Function Tests: CBG: Recent Labs  Lab 02/26/20 0728 02/26/20 1155 02/26/20 1657 02/26/20 2144 02/27/20 0744  GLUCAP 76 90 129* 175* 82   Urine analysis:    Component Value Date/Time   COLORURINE YELLOW 01/28/2020 0727   APPEARANCEUR HAZY (A) 01/28/2020 0727   LABSPEC 1.015 01/28/2020 0727   PHURINE 6.0 01/28/2020 0727   GLUCOSEU 50 (A) 01/28/2020 0727   HGBUR SMALL (A) 01/28/2020 0727   BILIRUBINUR NEGATIVE 01/28/2020 0727   KETONESUR 5 (A) 01/28/2020 0727   PROTEINUR NEGATIVE 01/28/2020 0727   NITRITE NEGATIVE 01/28/2020 0727   LEUKOCYTESUR NEGATIVE 01/28/2020 0727   Radiology Studies: No results found.    Scheduled Meds: . amLODipine  10 mg Oral Daily  . apixaban  5 mg Oral BID  . atorvastatin  80 mg Oral Daily  . hydrochlorothiazide  25 mg Oral Daily  . insulin aspart  0-9 Units Subcutaneous TID WC  . lisinopril  40 mg Oral Daily  . magnesium oxide  400 mg Oral BID  . multivitamin with minerals  1 tablet Oral Daily  . sodium chloride flush  3 mL Intravenous Q12H   Continuous Infusions:   LOS: 34 days   Hughie Closs, MD [facility] Pager: [email or web address] 02/27/2020, 11:28 AM

## 2020-02-28 LAB — GLUCOSE, CAPILLARY
Glucose-Capillary: 188 mg/dL — ABNORMAL HIGH (ref 70–99)
Glucose-Capillary: 121 mg/dL — ABNORMAL HIGH (ref 70–99)
Glucose-Capillary: 145 mg/dL — ABNORMAL HIGH (ref 70–99)
Glucose-Capillary: 199 mg/dL — ABNORMAL HIGH (ref 70–99)

## 2020-02-28 NOTE — NC FL2 (Signed)
MEDICAID FL2 LEVEL OF CARE SCREENING TOOL     IDENTIFICATION  Patient Name: Kevin [name] [place] Birthdate: October 02, 1965 Sex: male Admission Date (Current Location): 01/23/2020  [facility] and [place] Number:  Producer, television/film/video and Address:  The . [facility], [address] [place], [place] [address]      Provider Number: [id number]  Attending Physician Name and Address:  Hughie Closs, MD  Relative Name and Phone Number:  mother Emilie Rutter [phone]    Current Level of Care: Hospital Recommended Level of Care: Assisted Living [facility] Prior Approval Number:    Date Approved/Denied:   PASRR Number: [id number]  Discharge Plan: SNF    Current Diagnoses: Patient Active Problem List   Diagnosis Date Noted  . Acute hypernatremia 01/24/2020  . Hypernatremia 01/23/2020  . Chronic CHF (congestive heart failure) (HCC) 01/23/2020  . CKD (chronic kidney disease), stage III (HCC) 01/08/2020  . Essential hypertension 01/05/2020  . Acute on chronic combined systolic and diastolic CHF (congestive heart failure) (HCC) 01/05/2020  . History of noncompliance with medical treatment 01/05/2020  . CVA (cerebral vascular accident) (HCC) 01/04/2020  . History of CVA (cerebrovascular accident) 01/04/2020  . Hypertensive emergency 11/29/2019  . Tobacco dependence 11/29/2019  . Marijuana abuse 11/29/2019  . Renal dysfunction 11/29/2019  . Hypokalemia 11/29/2019  . Diabetes mellitus without complication (HCC)     Orientation RESPIRATION BLADDER Height & Weight     Self,Time,Situation,Place  Normal Continent Weight: 149 lb 7.6 oz (67.8 kg) Height:  6' (182.9 cm)  BEHAVIORAL SYMPTOMS/MOOD NEUROLOGICAL BOWEL NUTRITION STATUS      Continent Diet (See discharge summary)  AMBULATORY STATUS COMMUNICATION OF NEEDS Skin   Supervision Verbally Normal                       Personal Care Assistance Level of Assistance   Bathing,Feeding,Dressing Bathing Assistance: Independent Feeding assistance: Independent Dressing Assistance: Independent     Functional Limitations Info  Sight,Hearing,Speech Sight Info: Adequate Hearing Info: Adequate Speech Info: Adequate    SPECIAL CARE FACTORS FREQUENCY  PT (By licensed PT),OT (By licensed OT)     PT Frequency: five times a week OT Frequency: five times a week            Contractures Contractures Info: Not present    Additional Factors Info  Code Status,Allergies Code Status Info: Full Allergies Info: NKA   Insulin Sliding Scale Info: Novolog 0-9 units TID with meals       Current Medications (02/28/2020):  This is the current hospital active medication list Current Facility-Administered Medications  Medication Dose Route Frequency Provider Last Rate Last Admin  . acetaminophen (TYLENOL) tablet 650 mg  650 mg Oral Q6H PRN Synetta Fail, MD   650 mg at 02/18/20 2023   Or  . acetaminophen (TYLENOL) suppository 650 mg  650 mg Rectal Q6H PRN Synetta Fail, MD      . amLODipine (NORVASC) tablet 10 mg  10 mg Oral Daily Synetta Fail, MD   10 mg at 02/28/20 0944  . apixaban (ELIQUIS) tablet 5 mg  5 mg Oral BID Synetta Fail, MD   5 mg at 02/28/20 0945  . atorvastatin (LIPITOR) tablet 80 mg  80 mg Oral Daily Synetta Fail, MD   80 mg at 02/28/20 0944  . haloperidol lactate (HALDOL) injection 2 mg  2 mg Intravenous Q6H PRN Tyrone Nine, MD   2 mg  at 02/18/20 2332  . hydrALAZINE (APRESOLINE) injection 10 mg  10 mg Intravenous Q6H PRN Hughie Closs, MD   10 mg at 02/24/20 0606  . hydrochlorothiazide (HYDRODIURIL) tablet 25 mg  25 mg Oral Daily Shahmehdi, Seyed A, MD   25 mg at 02/28/20 0945  . insulin aspart (novoLOG) injection 0-9 Units  0-9 Units Subcutaneous TID WC Kendell Bane, MD   1 Units at 02/26/20 1830  . lisinopril (ZESTRIL) tablet 40 mg  40 mg Oral Daily Shahmehdi, Seyed A, MD   40 mg at 02/28/20 0944  .  magnesium oxide (MAG-OX) tablet 400 mg  400 mg Oral BID Pokhrel, Laxman, MD   400 mg at 02/28/20 0944  . multivitamin with minerals tablet 1 tablet  1 tablet Oral Daily Myrtie Neither, MD   1 tablet at 02/28/20 0944  . polyethylene glycol (MIRALAX / GLYCOLAX) packet 17 g  17 g Oral Daily PRN Synetta Fail, MD      . sodium chloride flush (NS) 0.9 % injection 3 mL  3 mL Intravenous Q12H Synetta Fail, MD   3 mL at 02/28/20 2836     Discharge Medications: Please see discharge summary for a list of discharge medications.  Relevant Imaging Results:  Relevant Lab Results:   Additional Information SS [phone] , has had both covid pfizer vaccinations, has applied for medicaid  and disibility, letter of guarantee  Jimmy Picket, LCSWA

## 2020-02-28 NOTE — Progress Notes (Signed)
PROGRESS NOTE    Kevin [name] [facility]   MPN:[id number]  DOB: November 08, 1965  DOA: 01/23/2020 PCP: Medicine, [facility]   Brief Narrative:  Kevin [name] is a 55 y.o.malewith a history of chronic combined HFrEF, stage III CKD, CVA, left eye blindness, T2DM, HTN who presented to the ED 1/12 with progressive weakness and increasing cognitive decline over the past month. Work up has shown no infectious etiology to acute metabolic encephalopathy, and neuroimaging has shown no stroke. He remains severely debilitated, so SNF disposition is pursued, complicated by uninsured status.Difficulty in placement persists.  Subjective: Seen and examined.  Slightly sleepy but oriented x1 at his baseline as usual.  No complaints.  Assessment & Plan:   Principal Problem:    Hypernatremia, AKI on CKD 2 -Likely due to dehydration-improved -Resolved without IV fluids... DC'd IV fluid -Last creatinine 1.27, BUN 25.  BMP was ordered for today but labs are still not collected.  Active Problems:  Acute metabolic encephalopathy -Suspected to be due to CVA -now presumed to have chronic cognitive deficits-work-up for reversible etiologies has been negative -Improved mentation back to baseline    Diabetes mellitus without complication  -Hemoglobin A1c 5.8 -Continue checking blood sugars q. [facility] S, with SSI coverage    History of CVA (cerebrovascular accident) -12/21 -Has residual right-sided weakness and dysphagia -Continue Eliquis and statin -No new focal neurological findings, stable  Hypertension/chronic combined heart failure -EF of 20 to 25% per TEE on 12/21 -Cath was deferred due to his poor adherence with medications -Heart rate too low for beta-blocker-can continue amlodipine, lisinopril, HCTZ -Recommend repeat echo in a few months -Increased lisinopril from 20 to 40 mg daily.  Blood pressure fairly controlled.  Time spent in minutes: 34 DVT prophylaxis:  apixaban (ELIQUIS)  tablet 5 mg  Code Status: Full code Family Communication: None present Level of Care: Level of care: Telemetry Medical Disposition Plan:  Status is: Inpatient    Dispo:  Patient From: Home  Planned Disposition: Skilled Nursing Facility --pending approval -delayed patient does not have  Insurance (unsafe to be discharged home -Per PT needing assist in 24 hours-recommending  SNF) Hospital CSW in consistent communication with DSS medicaid CSW   Expected discharge date: 03/03/2020  Medically stable for discharge: Yes    Consultants:   None Procedures:   None Antimicrobials:  Anti-infectives (From admission, onward)   None       Objective: Vitals:   02/27/20 1504 02/27/20 2048 02/28/20 0500 02/28/20 0518  BP: 127/79 (!) 148/80  (!) 147/90  Pulse: 60 (!) 54  (!) 53  Resp: 16 18  18   Temp: 98.4 F (36.9 C) 98.2 F (36.8 C)  97.7 F (36.5 C)  TempSrc: Oral Oral  Oral  SpO2: 100% 98%  100%  Weight:   67.8 kg   Height:       No intake or output data in the 24 hours ending 02/28/20 1109 Filed Weights   02/26/20 0551 02/27/20 0500 02/28/20 0500  Weight: 68.7 kg 67.7 kg 67.8 kg   General exam: Appears calm and comfortable  Respiratory system: Clear to auscultation. Respiratory effort normal. Cardiovascular system: S1 & S2 heard, RRR. No JVD, murmurs, rubs, gallops or clicks. No pedal edema. Gastrointestinal system: Abdomen is nondistended, soft and nontender. No organomegaly or masses felt. Normal bowel sounds heard. Central nervous system: Alert and oriented x1 Extremities: Symmetric 5 x 5 power. Skin: No rashes, lesions or ulcers.  Psychiatry: Judgement and insight appear poor  Data Reviewed: I have personally reviewed following labs and imaging studies  CBC: Recent Labs  Lab 02/25/20 1110  WBC 3.4*  NEUTROABS 1.5*  HGB 10.7*  HCT 32.6*  MCV 90.6  PLT 148*   Basic Metabolic Panel: Recent Labs  Lab 02/25/20 1110  NA 137  K 3.6  CL 100  CO2 30  GLUCOSE  91  BUN 23*  CREATININE 1.27*  CALCIUM 9.2  MG 2.0   GFR: Estimated Creatinine Clearance: 63 mL/min (A) (by C-G formula based on SCr of 1.27 mg/dL (H)). Liver Function Tests: CBG: Recent Labs  Lab 02/26/20 2144 02/27/20 0744 02/27/20 1409 02/27/20 2057 02/28/20 0748  GLUCAP 175* 82 121* 150* 188*   Urine analysis:    Component Value Date/Time   COLORURINE YELLOW 01/28/2020 0727   APPEARANCEUR HAZY (A) 01/28/2020 0727   LABSPEC 1.015 01/28/2020 0727   PHURINE 6.0 01/28/2020 0727   GLUCOSEU 50 (A) 01/28/2020 0727   HGBUR SMALL (A) 01/28/2020 0727   BILIRUBINUR NEGATIVE 01/28/2020 0727   KETONESUR 5 (A) 01/28/2020 0727   PROTEINUR NEGATIVE 01/28/2020 0727   NITRITE NEGATIVE 01/28/2020 0727   LEUKOCYTESUR NEGATIVE 01/28/2020 0727   Radiology Studies: No results found.    Scheduled Meds: . amLODipine  10 mg Oral Daily  . apixaban  5 mg Oral BID  . atorvastatin  80 mg Oral Daily  . hydrochlorothiazide  25 mg Oral Daily  . insulin aspart  0-9 Units Subcutaneous TID WC  . lisinopril  40 mg Oral Daily  . magnesium oxide  400 mg Oral BID  . multivitamin with minerals  1 tablet Oral Daily  . sodium chloride flush  3 mL Intravenous Q12H   Continuous Infusions:   LOS: 35 days   Hughie Closs, MD [facility] Pager: [email or web address] 02/28/2020, 11:09 AM

## 2020-02-29 LAB — GLUCOSE, CAPILLARY
Glucose-Capillary: 94 mg/dL (ref 70–99)
Glucose-Capillary: 118 mg/dL — ABNORMAL HIGH (ref 70–99)
Glucose-Capillary: 142 mg/dL — ABNORMAL HIGH (ref 70–99)
Glucose-Capillary: 117 mg/dL — ABNORMAL HIGH (ref 70–99)

## 2020-02-29 NOTE — Progress Notes (Addendum)
PROGRESS NOTE    Kevin [name] [facility]  LKG:[id number] DOB: 1965-10-24 DOA: 01/23/2020 PCP: Medicine, [facility]   Brief Narrative: Kevin [name] is a 55 y.o. male with a history of heart failure, CKD stage III, CVA, diabetes mellitus, hypertension. Patient presented secondary to weakness and progressive cognitive decline in setting of prior stroke.   Assessment & Plan:   Principal Problem:   Hypernatremia Active Problems:   Diabetes mellitus without complication (HCC)   History of CVA (cerebrovascular accident)   Essential hypertension   CKD (chronic kidney disease), stage III (HCC)   Chronic CHF (congestive heart failure) (HCC)   Acute hypernatremia   Acute metabolic encephalopathy Possibly secondary to CVA with chronic cognitive deficits. Encephalopathy resolved.  Diabetes mellitus, type 2 Hemoglobin A1C of 5.8%.  History of CVA Right sided hemiparesis with dysphagia. On Eliquis and Lipitor. -Continue Eliquis, Lipitor  Primary hypertension -Continue amlodipine, hydrochlorothiazide and lisinopril  Chronic combined systolic and diastolic heart failure EF of 20-25%. Currently stable. No betablocker secondary to bradycardia. -Continue lisinopril.  AKI on CKD stage IIIa Resolved.  Hypernatremia Resolved.   DVT prophylaxis: Eliquis Code Status:   Code Status: Full Code Family Communication: None at bedside Disposition Plan: Discharge to SNF pending bed   Consultants:   Neurology  Procedures:   None  Antimicrobials:  None    Subjective: No concerns today.  Objective: Vitals:   02/28/20 0518 02/28/20 1322 02/28/20 2156 02/29/20 0453  BP: (!) 147/90 127/79 134/71 129/77  Pulse: (!) 53 (!) 58 64 (!) 54  Resp: 18 19 18 18   Temp: 97.7 F (36.5 C) 98.2 F (36.8 C) 98.3 F (36.8 C) 97.7 F (36.5 C)  TempSrc: Oral Oral Oral Oral  SpO2: 100% 100% 100% 100%  Weight:    68.6 kg  Height:        Intake/Output Summary (Last 24 hours)  at 02/29/2020 1401 Last data filed at 02/28/2020 2200 Gross per 24 hour  Intake 90 ml  Output --  Net 90 ml   Filed Weights   02/27/20 0500 02/28/20 0500 02/29/20 0453  Weight: 67.7 kg 67.8 kg 68.6 kg    Examination:  General exam: Appears calm and comfortable Respiratory system: Clear to auscultation. Respiratory effort normal. Cardiovascular system: S1 & S2 heard, RRR. No murmurs, rubs, gallops or clicks. Gastrointestinal system: Abdomen is nondistended, soft and nontender. No organomegaly or masses felt. Normal bowel sounds heard. Central nervous system: Alert and oriented. No focal neurological deficits. Musculoskeletal: No edema. No calf tenderness Skin: No cyanosis. No rashes Psychiatry: Judgement and insight appear normal. Mood & affect appropriate.     Data Reviewed: I have personally reviewed following labs and imaging studies  CBC Lab Results  Component Value Date   WBC 3.4 (L) 02/25/2020   RBC 3.60 (L) 02/25/2020   HGB 10.7 (L) 02/25/2020   HCT 32.6 (L) 02/25/2020   MCV 90.6 02/25/2020   MCH 29.7 02/25/2020   PLT 148 (L) 02/25/2020   MCHC 32.8 02/25/2020   RDW 12.6 02/25/2020   LYMPHSABS 1.4 02/25/2020   MONOABS 0.3 02/25/2020   EOSABS 0.1 02/25/2020   BASOSABS 0.0 02/25/2020     Last metabolic panel Lab Results  Component Value Date   NA 137 02/25/2020   K 3.6 02/25/2020   CL 100 02/25/2020   CO2 30 02/25/2020   BUN 23 (H) 02/25/2020   CREATININE 1.27 (H) 02/25/2020   GLUCOSE 91 02/25/2020   GFRNONAA >60 02/25/2020   CALCIUM  9.2 02/25/2020   PHOS 2.4 (L) 02/01/2020   PROT 6.3 (L) 02/18/2020   ALBUMIN 3.2 (L) 02/18/2020   BILITOT 1.0 02/18/2020   ALKPHOS 61 02/18/2020   AST 19 02/18/2020   ALT 24 02/18/2020   ANIONGAP 7 02/25/2020    CBG (last 3)  Recent Labs    02/28/20 2154 02/29/20 0851 02/29/20 1220  GLUCAP 199* 94 118*     GFR: Estimated Creatinine Clearance: 63.8 mL/min (A) (by C-G formula based on SCr of 1.27 mg/dL  (H)).  Coagulation Profile: No results for input(s): INR, PROTIME in the last 168 hours.  No results found for this or any previous visit (from the past 240 hour(s)).      Radiology Studies: No results found.      Scheduled Meds: . amLODipine  10 mg Oral Daily  . apixaban  5 mg Oral BID  . atorvastatin  80 mg Oral Daily  . hydrochlorothiazide  25 mg Oral Daily  . insulin aspart  0-9 Units Subcutaneous TID WC  . lisinopril  40 mg Oral Daily  . magnesium oxide  400 mg Oral BID  . multivitamin with minerals  1 tablet Oral Daily  . sodium chloride flush  3 mL Intravenous Q12H   Continuous Infusions:   LOS: 36 days     Jacquelin Hawking, MD [facility] 02/29/2020, 2:01 PM  If 7PM-7AM, please contact night-coverage [email or web address]

## 2020-02-29 NOTE — Progress Notes (Addendum)
Occupational Therapy Treatment Patient Details Name: Kevin [name] MRN: [id number] DOB: 03-18-1965 Today's Date: 02/29/2020    History of present illness Pt is a 55 y.o. male admitted 01/23/20 with weakness and generalized decline at home; workup for metabolic encephalopathy, hyponatremia, AKI on CKD. 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   OT comments  Pt. Seen for skilled OT treatment session.  Found standing in room holding catheter bag. Able to ambulate to recliner for grooming task. Focus on session was ability to have sustained attention during task completion.  Pt. Able to complete oral care without cues or physical assistance. No delays or distractions during task. Pt. Was able to set up all items and indicate when he was finished.     Follow Up Recommendations  SNF;Supervision/Assistance - 24 hour    Equipment Recommendations  None recommended by OT    Recommendations for Other Services      Precautions / Restrictions Precautions Precautions: Fall Precaution Comments: L eye blindness       Mobility Bed Mobility               General bed mobility comments: standing in b.room upon arrival  Transfers Overall transfer level: Needs assistance Equipment used: None Transfers: Sit to/from [employer] Sit to Stand: Supervision Stand pivot transfers: Supervision       General transfer comment: S with safety.    Balance                                           ADL either performed or assessed with clinical judgement   ADL Overall ADL's : Needs assistance/impaired     Grooming: Oral care;Sitting Grooming Details (indicate cue type and reason): no cues required for initiation or completion this day                             Functional mobility during ADLs: Supervision/safety General ADL Comments: pt. found standing in b.room at the sink  holding catheter bag. assisted to recliner.  able to focus and complete grooming task without cues or instructions.     Vision       Perception     Praxis      Cognition Arousal/Alertness: Awake/alert Behavior During Therapy: WFL for tasks assessed/performed                                            Exercises     Shoulder Instructions       General Comments      Pertinent Vitals/ Pain       Pain Assessment: No/denies pain  Home Living                                          Prior Functioning/Environment              Frequency  Min 2X/week        Progress Toward Goals  OT Goals(current goals can now be found in the care plan section)  Progress towards OT goals: Progressing toward goals  Plan Discharge plan remains appropriate    Co-evaluation                 AM-PAC OT "6 Clicks" Daily Activity     Outcome Measure   Help from another person eating meals?: None Help from another person taking care of personal grooming?: A Little Help from another person toileting, which includes using toliet, bedpan, or urinal?: A Little Help from another person bathing (including washing, rinsing, drying)?: A Little Help from another person to put on and taking off regular upper body clothing?: None Help from another person to put on and taking off regular lower body clothing?: A Little 6 Click Score: 20    End of Session    OT Visit Diagnosis: Unsteadiness on feet (R26.81);Cognitive communication deficit (R41.841);Low vision, both eyes (H54.2);Other symptoms and signs involving cognitive function;Muscle weakness (generalized) (M62.81);Other abnormalities of gait and mobility (R26.89) Symptoms and signs involving cognitive functions: Cerebral infarction   Activity Tolerance     Patient Left in chair;with call bell/phone within reach;with chair alarm set   Nurse Communication          Time: 2409-7353 OT Time  Calculation (min): 8 min  Charges: OT General Charges $OT Visit: 1 Visit OT Treatments $Self Care/Home Management : 8-22 mins  Boneta Lucks, COTA/L Acute Rehabilitation [phone]   Robet Leu 02/29/2020, 12:04 PM

## 2020-03-01 LAB — GLUCOSE, CAPILLARY
Glucose-Capillary: 96 mg/dL (ref 70–99)
Glucose-Capillary: 109 mg/dL — ABNORMAL HIGH (ref 70–99)
Glucose-Capillary: 157 mg/dL — ABNORMAL HIGH (ref 70–99)
Glucose-Capillary: 121 mg/dL — ABNORMAL HIGH (ref 70–99)

## 2020-03-01 NOTE — Progress Notes (Signed)
PROGRESS NOTE    Ajax Schroll [facility]  KKX:[id number] DOB: Jan 21, 1965 DOA: 01/23/2020 PCP: Medicine, [facility]   Brief Narrative: Melton Walls Wahlberg is a 55 y.o. male with a history of heart failure, CKD stage III, CVA, diabetes mellitus, hypertension. Patient presented secondary to weakness and progressive cognitive decline in setting of prior stroke.   Assessment & Plan:   Principal Problem:   Hypernatremia Active Problems:   Diabetes mellitus without complication (HCC)   History of CVA (cerebrovascular accident)   Essential hypertension   CKD (chronic kidney disease), stage III (HCC)   Chronic CHF (congestive heart failure) (HCC)   Acute hypernatremia   Acute metabolic encephalopathy Possibly secondary to CVA with chronic cognitive deficits. Encephalopathy resolved.  Diabetes mellitus, type 2 Hemoglobin A1C of 5.8%. Discontinued SSI.  History of CVA Right sided hemiparesis with dysphagia. On Eliquis and Lipitor. -Continue Eliquis, Lipitor  Primary hypertension -Continue amlodipine, hydrochlorothiazide and lisinopril  Chronic combined systolic and diastolic heart failure EF of 20-25%. Currently stable. No betablocker secondary to bradycardia. -Continue lisinopril.  AKI on CKD stage IIIa Resolved.  Hypernatremia Resolved.   DVT prophylaxis: Eliquis Code Status:   Code Status: Full Code Family Communication: None at bedside Disposition Plan: Discharge to SNF pending bed   Consultants:   Neurology  Procedures:   None  Antimicrobials:  None    Subjective: No issues/concerns today. Laying in bed trying to sleep.  Objective: Vitals:   02/29/20 0453 02/29/20 1412 02/29/20 2053 03/01/20 0322  BP: 129/77 113/80 (!) 152/90 (!) 168/84  Pulse: (!) 54 63 (!) 52 (!) 47  Resp: 18 17 18 16   Temp: 97.7 F (36.5 C) 98.4 F (36.9 C) 98.4 F (36.9 C) 98.2 F (36.8 C)  TempSrc: Oral Oral Oral   SpO2: 100% 99% 100% 100%  Weight: 68.6 kg      Height:        Intake/Output Summary (Last 24 hours) at 03/01/2020 1040 Last data filed at 03/01/2020 0300 Gross per 24 hour  Intake --  Output 500 ml  Net -500 ml   Filed Weights   02/27/20 0500 02/28/20 0500 02/29/20 0453  Weight: 67.7 kg 67.8 kg 68.6 kg    Examination:  General: Well appearing, no distress    Data Reviewed: I have personally reviewed following labs and imaging studies  CBC Lab Results  Component Value Date   WBC 3.4 (L) 02/25/2020   RBC 3.60 (L) 02/25/2020   HGB 10.7 (L) 02/25/2020   HCT 32.6 (L) 02/25/2020   MCV 90.6 02/25/2020   MCH 29.7 02/25/2020   PLT 148 (L) 02/25/2020   MCHC 32.8 02/25/2020   RDW 12.6 02/25/2020   LYMPHSABS 1.4 02/25/2020   MONOABS 0.3 02/25/2020   EOSABS 0.1 02/25/2020   BASOSABS 0.0 02/25/2020     Last metabolic panel Lab Results  Component Value Date   NA 137 02/25/2020   K 3.6 02/25/2020   CL 100 02/25/2020   CO2 30 02/25/2020   BUN 23 (H) 02/25/2020   CREATININE 1.27 (H) 02/25/2020   GLUCOSE 91 02/25/2020   GFRNONAA >60 02/25/2020   CALCIUM 9.2 02/25/2020   PHOS 2.4 (L) 02/01/2020   PROT 6.3 (L) 02/18/2020   ALBUMIN 3.2 (L) 02/18/2020   BILITOT 1.0 02/18/2020   ALKPHOS 61 02/18/2020   AST 19 02/18/2020   ALT 24 02/18/2020   ANIONGAP 7 02/25/2020    CBG (last 3)  Recent Labs    02/29/20 1711 02/29/20 2054 03/01/20  0752  GLUCAP 142* 117* 96     GFR: Estimated Creatinine Clearance: 63.8 mL/min (A) (by C-G formula based on SCr of 1.27 mg/dL (H)).  Coagulation Profile: No results for input(s): INR, PROTIME in the last 168 hours.  No results found for this or any previous visit (from the past 240 hour(s)).      Radiology Studies: No results found.      Scheduled Meds: . amLODipine  10 mg Oral Daily  . apixaban  5 mg Oral BID  . atorvastatin  80 mg Oral Daily  . hydrochlorothiazide  25 mg Oral Daily  . lisinopril  40 mg Oral Daily  . magnesium oxide  400 mg Oral BID  .  multivitamin with minerals  1 tablet Oral Daily  . sodium chloride flush  3 mL Intravenous Q12H   Continuous Infusions:   LOS: 37 days     Jacquelin Hawking, MD [facility] 03/01/2020, 10:40 AM  If 7PM-7AM, please contact night-coverage [email or web address]

## 2020-03-02 LAB — GLUCOSE, CAPILLARY
Glucose-Capillary: 133 mg/dL — ABNORMAL HIGH (ref 70–99)
Glucose-Capillary: 95 mg/dL (ref 70–99)
Glucose-Capillary: 191 mg/dL — ABNORMAL HIGH (ref 70–99)
Glucose-Capillary: 163 mg/dL — ABNORMAL HIGH (ref 70–99)

## 2020-03-02 NOTE — Progress Notes (Signed)
PROGRESS NOTE    Kevin [name] [facility]  [id number]:[id number] DOB: 11-15-1965 DOA: 01/23/2020 PCP: Medicine, [facility]   Brief Narrative: Kevin [name] is a 55 y.o. male with a history of heart failure, CKD stage III, CVA, diabetes mellitus, hypertension. Patient presented secondary to weakness and progressive cognitive decline in setting of prior stroke.   Assessment & Plan:   Principal Problem:   Hypernatremia Active Problems:   Diabetes mellitus without complication (HCC)   History of CVA (cerebrovascular accident)   Essential hypertension   CKD (chronic kidney disease), stage III (HCC)   Chronic CHF (congestive heart failure) (HCC)   Acute hypernatremia   Acute metabolic encephalopathy Possibly secondary to CVA with chronic cognitive deficits. Encephalopathy resolved.  Diabetes mellitus, type 2 Hemoglobin A1C of 5.8%. Discontinued SSI.  History of CVA Right sided hemiparesis with dysphagia. On Eliquis and Lipitor. -Continue Eliquis, Lipitor  Primary hypertension -Continue amlodipine, hydrochlorothiazide and lisinopril  Chronic combined systolic and diastolic heart failure EF of 20-25%. Currently stable. No betablocker secondary to bradycardia. -Continue lisinopril.  AKI on CKD stage IIIa Resolved.  Hypernatremia Resolved.   DVT prophylaxis: Eliquis Code Status:   Code Status: Full Code Family Communication: None at bedside Disposition Plan: Discharge to SNF pending bed   Consultants:   Neurology  Procedures:   None  Antimicrobials:  None    Subjective: No issues overnight or this morning. Asking for more food.  Objective: Vitals:   03/01/20 0322 03/01/20 1524 03/01/20 2052 03/02/20 0309  BP: (!) 168/84 135/75 (!) 144/87 135/84  Pulse: (!) 47 (!) 49 (!) 58 (!) 54  Resp: 16 16 18 18   Temp: 98.2 F (36.8 C) 98.1 F (36.7 C) 98.2 F (36.8 C) 98 F (36.7 C)  TempSrc:  Oral Oral Oral  SpO2: 100% 100% 100% 100%  Weight:       Height:        Intake/Output Summary (Last 24 hours) at 03/02/2020 1003 Last data filed at 03/01/2020 1330 Gross per 24 hour  Intake 480 ml  Output -  Net 480 ml   Filed Weights   02/27/20 0500 02/28/20 0500 02/29/20 0453  Weight: 67.7 kg 67.8 kg 68.6 kg    Examination:  General exam: Appears calm and comfortable   Data Reviewed: I have personally reviewed following labs and imaging studies  CBC Lab Results  Component Value Date   WBC 3.4 (L) 02/25/2020   RBC 3.60 (L) 02/25/2020   HGB 10.7 (L) 02/25/2020   HCT 32.6 (L) 02/25/2020   MCV 90.6 02/25/2020   MCH 29.7 02/25/2020   PLT 148 (L) 02/25/2020   MCHC 32.8 02/25/2020   RDW 12.6 02/25/2020   LYMPHSABS 1.4 02/25/2020   MONOABS 0.3 02/25/2020   EOSABS 0.1 02/25/2020   BASOSABS 0.0 02/25/2020     Last metabolic panel Lab Results  Component Value Date   NA 137 02/25/2020   K 3.6 02/25/2020   CL 100 02/25/2020   CO2 30 02/25/2020   BUN 23 (H) 02/25/2020   CREATININE 1.27 (H) 02/25/2020   GLUCOSE 91 02/25/2020   GFRNONAA >60 02/25/2020   CALCIUM 9.2 02/25/2020   PHOS 2.4 (L) 02/01/2020   PROT 6.3 (L) 02/18/2020   ALBUMIN 3.2 (L) 02/18/2020   BILITOT 1.0 02/18/2020   ALKPHOS 61 02/18/2020   AST 19 02/18/2020   ALT 24 02/18/2020   ANIONGAP 7 02/25/2020    CBG (last 3)  Recent Labs    03/01/20 1624 03/01/20 2053  03/02/20 0832  GLUCAP 157* 121* 133*     GFR: Estimated Creatinine Clearance: 63.8 mL/min (A) (by C-G formula based on SCr of 1.27 mg/dL (H)).  Coagulation Profile: No results for input(s): INR, PROTIME in the last 168 hours.  No results found for this or any previous visit (from the past 240 hour(s)).      Radiology Studies: No results found.      Scheduled Meds: . amLODipine  10 mg Oral Daily  . apixaban  5 mg Oral BID  . atorvastatin  80 mg Oral Daily  . hydrochlorothiazide  25 mg Oral Daily  . lisinopril  40 mg Oral Daily  . magnesium oxide  400 mg Oral BID  .  multivitamin with minerals  1 tablet Oral Daily  . sodium chloride flush  3 mL Intravenous Q12H   Continuous Infusions:   LOS: 38 days     Jacquelin Hawking, MD [facility] 03/02/2020, 10:03 AM  If 7PM-7AM, please contact night-coverage [email or web address]

## 2020-03-03 LAB — GLUCOSE, CAPILLARY
Glucose-Capillary: 113 mg/dL — ABNORMAL HIGH (ref 70–99)
Glucose-Capillary: 96 mg/dL (ref 70–99)
Glucose-Capillary: 196 mg/dL — ABNORMAL HIGH (ref 70–99)

## 2020-03-03 NOTE — Progress Notes (Signed)
PROGRESS NOTE    Kevin [name]  [id number] DOB: December 07, 1965 DOA: 01/23/2020 PCP: Medicine, [facility]   Brief Narrative: Kevin [name] is a 55 y.o. male with a history of [name] failure, CKD stage III, CVA, diabetes mellitus, hypertension. Patient presented secondary to weakness and progressive cognitive decline in setting of prior stroke.   Assessment & Plan:   Principal Problem:   Hypernatremia Active Problems:   Diabetes mellitus without complication (HCC)   History of CVA (cerebrovascular accident)   Essential hypertension   CKD (chronic kidney disease), stage III (HCC)   Chronic CHF (congestive [name] failure) (HCC)   Acute hypernatremia   Acute metabolic encephalopathy Possibly secondary to CVA with chronic cognitive deficits. Encephalopathy resolved.  Diabetes mellitus, type 2 Hemoglobin A1C of 5.8%. Discontinued SSI.  History of CVA Right sided hemiparesis with dysphagia. On Eliquis and Lipitor. -Continue Eliquis, Lipitor  Primary hypertension -Continue amlodipine, hydrochlorothiazide and lisinopril  Chronic combined systolic and diastolic [name] failure EF of 20-25%. Currently stable. No betablocker secondary to bradycardia. -Continue lisinopril.  AKI on CKD stage IIIa Resolved.  Hypernatremia Resolved.   DVT prophylaxis: Eliquis Code Status:   Code Status: Full Code Family Communication: None at bedside Disposition Plan: Discharge to SNF pending bed   Consultants:   Neurology  Procedures:   None  Antimicrobials:  None    Subjective: No issues noted overnight.  Objective: Vitals:   03/02/20 1610 03/02/20 2046 03/03/20 0434 03/03/20 0832  BP: 136/88 135/86 (!) 146/85 140/74  Pulse: 61 60 63 (!) 57  Resp: 19 18 18 16   Temp: 98 F (36.7 C) 98.1 F (36.7 C) 98.5 F (36.9 C) 97.9 F (36.6 C)  TempSrc: Oral Oral Oral Oral  SpO2: 100% 100% 100% 100%  Weight:      Height:        Intake/Output Summary (Last  24 hours) at 03/03/2020 1047 Last data filed at 03/02/2020 1500 Gross per 24 hour  Intake 300 ml  Output --  Net 300 ml   Filed Weights   02/27/20 0500 02/28/20 0500 02/29/20 0453  Weight: 67.7 kg 67.8 kg 68.6 kg    Examination:  General: Well appearing, no distress   Data Reviewed: I have personally reviewed following labs and imaging studies  CBC Lab Results  Component Value Date   WBC 3.4 (L) 02/25/2020   RBC 3.60 (L) 02/25/2020   HGB 10.7 (L) 02/25/2020   HCT 32.6 (L) 02/25/2020   MCV 90.6 02/25/2020   MCH 29.7 02/25/2020   PLT 148 (L) 02/25/2020   MCHC 32.8 02/25/2020   RDW 12.6 02/25/2020   LYMPHSABS 1.4 02/25/2020   MONOABS 0.3 02/25/2020   EOSABS 0.1 02/25/2020   BASOSABS 0.0 02/25/2020     Last metabolic panel Lab Results  Component Value Date   NA 137 02/25/2020   K 3.6 02/25/2020   CL 100 02/25/2020   CO2 30 02/25/2020   BUN 23 (H) 02/25/2020   CREATININE 1.27 (H) 02/25/2020   GLUCOSE 91 02/25/2020   GFRNONAA >60 02/25/2020   CALCIUM 9.2 02/25/2020   PHOS 2.4 (L) 02/01/2020   PROT 6.3 (L) 02/18/2020   ALBUMIN 3.2 (L) 02/18/2020   BILITOT 1.0 02/18/2020   ALKPHOS 61 02/18/2020   AST 19 02/18/2020   ALT 24 02/18/2020   ANIONGAP 7 02/25/2020    CBG (last 3)  Recent Labs    03/02/20 1133 03/02/20 1806 03/02/20 2048  GLUCAP 95 191* 163*  GFR: Estimated Creatinine Clearance: 63.8 mL/min (A) (by C-G formula based on SCr of 1.27 mg/dL (H)).  Coagulation Profile: No results for input(s): INR, PROTIME in the last 168 hours.  No results found for this or any previous visit (from the past 240 hour(s)).      Radiology Studies: No results found.      Scheduled Meds: . amLODipine  10 mg Oral Daily  . apixaban  5 mg Oral BID  . atorvastatin  80 mg Oral Daily  . hydrochlorothiazide  25 mg Oral Daily  . lisinopril  40 mg Oral Daily  . magnesium oxide  400 mg Oral BID  . multivitamin with minerals  1 tablet Oral Daily  . sodium  chloride flush  3 mL Intravenous Q12H   Continuous Infusions:   LOS: 39 days     Jacquelin Hawking, MD [facility] 03/03/2020, 10:47 AM  If 7PM-7AM, please contact night-coverage [email or web address]

## 2020-03-04 LAB — GLUCOSE, CAPILLARY
Glucose-Capillary: 105 mg/dL — ABNORMAL HIGH (ref 70–99)
Glucose-Capillary: 106 mg/dL — ABNORMAL HIGH (ref 70–99)
Glucose-Capillary: 140 mg/dL — ABNORMAL HIGH (ref 70–99)
Glucose-Capillary: 126 mg/dL — ABNORMAL HIGH (ref 70–99)

## 2020-03-04 NOTE — Progress Notes (Signed)
Physical Therapy Treatment Patient Details Name: Kevin [name] MRN: [id number] DOB: 09-Nov-1965 Today's Date: 03/04/2020    History of Present Illness Pt is a 55 y.o. male admitted 01/23/20 with weakness and generalized decline at home; workup for metabolic encephalopathy, hyponatremia, AKI on CKD. 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    PT Comments    Pt progressing towards goals. Requiring supervision to min guard for gait tasks this session. Increased instability noted when performing dynamic gait tasks. Also had increased difficulty finding room at end of session and required max multimodal cues. Current recommendations appropriate. Will continue to follow acutely.     Follow Up Recommendations  Other (comment) (currently working on SNF placement. alternative could be ALF or LTC due to cognition)     Equipment Recommendations  None recommended by PT    Recommendations for Other Services       Precautions / Restrictions Precautions Precautions: Fall Precaution Comments: L eye blindness Restrictions Weight Bearing Restrictions: No    Mobility  Bed Mobility Overal bed mobility: Independent                  Transfers Overall transfer level: Needs assistance Equipment used: None Transfers: Sit to/from Stand Sit to Stand: Supervision         General transfer comment: Supervision for safety.  Ambulation/Gait Ambulation/Gait assistance: Supervision;Min guard Gait Distance (Feet): 400 Feet Assistive device: None Gait Pattern/deviations: Step-through pattern;Narrow base of support;Drifts right/left Gait velocity: Decreased   General Gait Details: Pt drifting to R and L when performing horizontal and vertical head turns. Requiring min guard when performing dynamic gait tasks. Otherwise required supervision. increased difficulty finding room at end of session and required max multimodal cues. Was  able to recite number, but had difficulty finding it   Social research officer, [employer] Rank[employer] (Stroke Patients Only)       Balance Overall balance assessment: Needs assistance Sitting-balance support: No upper extremity supported;Feet supported Sitting balance-Leahy Scale: Good     Standing balance support: No upper extremity supported;During functional activity Standing balance-Leahy Scale: Fair                              Cognition Arousal/Alertness: Awake/alert Behavior During Therapy: WFL for tasks assessed/performed Overall Cognitive Status: Impaired/Different from baseline Area of Impairment: Awareness;Following commands;Attention;Problem solving;Memory;Safety/judgement                   Current Attention Level: Sustained Memory: Decreased short-term memory Following Commands: Follows one step commands with increased time;Follows one step commands consistently Safety/Judgement: Decreased awareness of safety;Decreased awareness of deficits Awareness: Intellectual Problem Solving: Slow processing;Requires verbal cues General Comments: Pt having increased difficulty finding room this session. Required max multimodal cues to find his room.      Exercises General Exercises - Lower Extremity Hip Flexion/Marching: AROM;Both;10 reps;Standing (holding to bed rail) Mini-Sqauts: AROM;5 reps;Both (holding to bed rail)    General Comments        Pertinent Vitals/Pain Pain Assessment: No/denies pain    Home Living                      Prior Function            PT Goals (current goals can now be found in the care plan  section) Acute Rehab PT Goals Patient Stated Goal: none stated PT Goal Formulation: With patient Time For Goal Achievement: 03/18/20 Potential to Achieve Goals: Fair Additional Goals Additional Goal #1: Pt will score >19 on DGI to indicate low fall risk Progress towards PT goals: Progressing  toward goals    Frequency    Min 1X/week      PT Plan Equipment recommendations need to be updated    Co-evaluation              AM-PAC PT "6 Clicks" Mobility   Outcome Measure  Help needed turning from your back to your side while in a flat bed without using bedrails?: None Help needed moving from lying on your back to sitting on the side of a flat bed without using bedrails?: None Help needed moving to and from a bed to a chair (including a wheelchair)?: None Help needed standing up from a chair using your arms (e.g., wheelchair or bedside chair)?: None Help needed to walk in hospital room?: A Little Help needed climbing 3-5 steps with a railing? : A Little 6 Click Score: 22    End of Session Equipment Utilized During Treatment: Gait belt Activity Tolerance: Patient tolerated treatment well Patient left: in bed;with call bell/phone within reach Nurse Communication: Mobility status PT Visit Diagnosis: Unsteadiness on feet (R26.81);Muscle weakness (generalized) (M62.81)     Time: 2707-8675 PT Time Calculation (min) (ACUTE ONLY): 12 min  Charges:  $Gait Training: 8-22 mins                     Farley Ly, PT, DPT  Acute Rehabilitation Services  Pager: [phone] Office: [phone]    [facility] Prom 03/04/2020, 3:35 PM

## 2020-03-04 NOTE — Progress Notes (Signed)
PROGRESS NOTE    Kevin [name]  [id number] DOB: May 10, 1965 DOA: 01/23/2020 PCP: Medicine, [facility]   Brief Narrative: Kevin [name] is a 55 y.o. male with a history of heart failure, CKD stage III, CVA, diabetes mellitus, hypertension. Patient presented secondary to weakness and progressive cognitive decline in setting of prior stroke.   Assessment & Plan:   Principal Problem:   Hypernatremia Active Problems:   Diabetes mellitus without complication (HCC)   History of CVA (cerebrovascular accident)   Essential hypertension   CKD (chronic kidney disease), stage III (HCC)   Chronic CHF (congestive heart failure) (HCC)   Acute hypernatremia   Acute metabolic encephalopathy Possibly secondary to CVA with chronic cognitive deficits. Encephalopathy resolved.  Diabetes mellitus, type 2 Hemoglobin A1C of 5.8%. Discontinued SSI.  History of CVA Right sided hemiparesis with dysphagia. On Eliquis and Lipitor. -Continue Eliquis, Lipitor  Primary hypertension Slightly uncontrolled -Continue amlodipine, hydrochlorothiazide and lisinopril  Chronic combined systolic and diastolic heart failure EF of 20-25%. Currently stable. No betablocker secondary to bradycardia. -Continue lisinopril.  AKI on CKD stage IIIa Resolved.  Hypernatremia Resolved.   DVT prophylaxis: Eliquis Code Status:   Code Status: Full Code Family Communication: None at bedside Disposition Plan: Discharge to SNF pending bed   Consultants:   Neurology  Procedures:   None  Antimicrobials:  None    Subjective: Patient states no concerns.  Objective: Vitals:   03/03/20 0434 03/03/20 0832 03/03/20 2103 03/04/20 0359  BP: (!) 146/85 140/74 (!) 150/80 (!) 166/91  Pulse: 63 (!) 57 (!) 59 62  Resp: 18 16 16 16   Temp: 98.5 F (36.9 C) 97.9 F (36.6 C) 97.7 F (36.5 C) 98.2 F (36.8 C)  TempSrc: Oral Oral Oral Oral  SpO2: 100% 100% 100% 100%  Weight:    69.1 kg   Height:        Intake/Output Summary (Last 24 hours) at 03/04/2020 0826 Last data filed at 03/03/2020 1828 Gross per 24 hour  Intake 720 ml  Output -  Net 720 ml   Filed Weights   02/28/20 0500 02/29/20 0453 03/04/20 0359  Weight: 67.8 kg 68.6 kg 69.1 kg    Examination:  General: Well appearing, no distress   Data Reviewed: I have personally reviewed following labs and imaging studies  CBC Lab Results  Component Value Date   WBC 3.4 (L) 02/25/2020   RBC 3.60 (L) 02/25/2020   HGB 10.7 (L) 02/25/2020   HCT 32.6 (L) 02/25/2020   MCV 90.6 02/25/2020   MCH 29.7 02/25/2020   PLT 148 (L) 02/25/2020   MCHC 32.8 02/25/2020   RDW 12.6 02/25/2020   LYMPHSABS 1.4 02/25/2020   MONOABS 0.3 02/25/2020   EOSABS 0.1 02/25/2020   BASOSABS 0.0 02/25/2020     Last metabolic panel Lab Results  Component Value Date   NA 137 02/25/2020   K 3.6 02/25/2020   CL 100 02/25/2020   CO2 30 02/25/2020   BUN 23 (H) 02/25/2020   CREATININE 1.27 (H) 02/25/2020   GLUCOSE 91 02/25/2020   GFRNONAA >60 02/25/2020   CALCIUM 9.2 02/25/2020   PHOS 2.4 (L) 02/01/2020   PROT 6.3 (L) 02/18/2020   ALBUMIN 3.2 (L) 02/18/2020   BILITOT 1.0 02/18/2020   ALKPHOS 61 02/18/2020   AST 19 02/18/2020   ALT 24 02/18/2020   ANIONGAP 7 02/25/2020    CBG (last 3)  Recent Labs    03/03/20 1636 03/03/20 2104 03/04/20 0737  GLUCAP 96  196* 105*     GFR: Estimated Creatinine Clearance: 64.2 mL/min (A) (by C-G formula based on SCr of 1.27 mg/dL (H)).  Coagulation Profile: No results for input(s): INR, PROTIME in the last 168 hours.  No results found for this or any previous visit (from the past 240 hour(s)).      Radiology Studies: No results found.      Scheduled Meds: . amLODipine  10 mg Oral Daily  . apixaban  5 mg Oral BID  . atorvastatin  80 mg Oral Daily  . hydrochlorothiazide  25 mg Oral Daily  . lisinopril  40 mg Oral Daily  . magnesium oxide  400 mg Oral BID  . multivitamin  with minerals  1 tablet Oral Daily  . sodium chloride flush  3 mL Intravenous Q12H   Continuous Infusions:   LOS: 40 days     Jacquelin Hawking, MD [facility] 03/04/2020, 8:26 AM  If 7PM-7AM, please contact night-coverage [email or web address]

## 2020-03-05 LAB — CBC
HCT: 32.5 % — ABNORMAL LOW (ref 39.0–52.0)
Hemoglobin: 10.2 g/dL — ABNORMAL LOW (ref 13.0–17.0)
MCH: 28.5 pg (ref 26.0–34.0)
MCHC: 31.4 g/dL (ref 30.0–36.0)
MCV: 90.8 fL (ref 80.0–100.0)
Platelets: 121 10*3/uL — ABNORMAL LOW (ref 150–400)
RBC: 3.58 MIL/uL — ABNORMAL LOW (ref 4.22–5.81)
RDW: 12.9 % (ref 11.5–15.5)
WBC: 3.8 10*3/uL — ABNORMAL LOW (ref 4.0–10.5)
nRBC: 0 % (ref 0.0–0.2)

## 2020-03-05 LAB — GLUCOSE, CAPILLARY: Glucose-Capillary: 125 mg/dL — ABNORMAL HIGH (ref 70–99)

## 2020-03-05 LAB — BASIC METABOLIC PANEL
Anion gap: 9 (ref 5–15)
BUN: 28 mg/dL — ABNORMAL HIGH (ref 6–20)
CO2: 29 mmol/L (ref 22–32)
Calcium: 8.9 mg/dL (ref 8.9–10.3)
Chloride: 100 mmol/L (ref 98–111)
Creatinine, Ser: 1.27 mg/dL — ABNORMAL HIGH (ref 0.61–1.24)
GFR, Estimated: >60 mL/min (ref >60)
Glucose, Bld: 99 mg/dL (ref 70–99)
Potassium: 3.5 mmol/L (ref 3.5–5.1)
Sodium: 138 mmol/L (ref 135–145)

## 2020-03-05 NOTE — Progress Notes (Signed)
[facility] PROGRESS NOTE    Progress Note  Kevin [name]  NAT:[id number] DOB: 1965/11/01 DOA: 01/23/2020 PCP: Medicine, [facility]     Brief Narrative:   Kevin [name] is an 55 y.o. 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  Assessment/Plan:   Acute metabolic encephalopathy: Possibly secondary to chronic cognitive deficits in the setting of CVA. Her encephalopathy has resolved.  Diabetes mellitus type 2: With an A1c of 5.8, she is not on insulin. Anywhere from 95-1 25.  Essential hypertension: Continue amlodipine hydrochlorothiazide Lexapro.  Chronic systolic heart failure: EF of 42% not on beta-blocker due to bradycardia. Continue lisinopril.  Acute kidney injury on chronic kidney C stage IIIa: Likely hypovolemia resolved.  Hypovolemic hypernatremia: Resolved with IV fluid hydration.    DVT prophylaxis: lovenox Family Communication:none Status is: Inpatient  Remains inpatient appropriate because:Hemodynamically unstable   Dispo:  Patient From:    Planned Disposition: To be determined  Expected discharge date: 03/07/2020  Medically stable for discharge:            Code Status:     Code Status Orders  (From admission, onward)         Start     Ordered   01/23/20 2240  Full code  Continuous        01/23/20 2242        Code Status History    Date Active Date Inactive Code Status Order ID Comments User Context   01/04/2020 2001 01/08/2020 2218 Full Code [id number]  Teddy Spike, DO ED   11/29/2019 0904 12/01/2019 1950 Full Code [id number]  Jonah Blue, MD ED   Advance Care Planning Activity        IV Access:    Peripheral IV   Procedures and diagnostic studies:   No results found.   Medical  Consultants:    None.   Subjective:    Kevin [name] no complaints today.  Objective:    Vitals:   03/04/20 1656 03/04/20 2127 03/04/20 2127 03/05/20 0516  BP: 115/89 131/83 131/83 138/84  Pulse: 67 61 62 63  Resp: 16  18 18   Temp: 98.5 F (36.9 C)  98.5 F (36.9 C) 97.9 F (36.6 C)  TempSrc: Oral   Oral  SpO2: 100%  100% 100%  Weight:    69 kg  Height:       SpO2: 100 %   Intake/Output Summary (Last 24 hours) at 03/05/2020 1045 Last data filed at 03/05/2020 0730 Gross per 24 hour  Intake 1130 ml  Output 200 ml  Net 930 ml   Filed Weights   02/29/20 0453 03/04/20 0359 03/05/20 0516  Weight: 68.6 kg 69.1 kg 69 kg    Exam: General exam: In no acute distress. Respiratory system: Good air movement and clear to auscultation. Cardiovascular system: S1 & S2 heard, RRR. No JVD. Gastrointestinal system: Abdomen is nondistended, soft and nontender.  Extremities: No pedal edema. Skin: No rashes, lesions or ulcer   Data Reviewed:    Labs: Basic Metabolic Panel: Recent Labs  Lab 03/05/20 0208  NA 138  K 3.5  CL 100  CO2 29  GLUCOSE 99  BUN 28*  CREATININE 1.27*  CALCIUM 8.9   GFR Estimated Creatinine Clearance: 64.1 mL/min (A) (by C-G formula based on  SCr of 1.27 mg/dL (H)). Liver Function Tests: No results for input(s): AST, ALT, ALKPHOS, BILITOT, PROT, ALBUMIN in the last 168 hours. No results for input(s): LIPASE, AMYLASE in the last 168 hours. No results for input(s): AMMONIA in the last 168 hours. Coagulation profile No results for input(s): INR, PROTIME in the last 168 hours. COVID-19 Labs  No results for input(s): DDIMER, FERRITIN, LDH, CRP in the last 72 hours.  Lab Results  Component Value Date   SARSCOV2NAA NEGATIVE 01/23/2020   SARSCOV2NAA NEGATIVE 01/04/2020   SARSCOV2NAA NEGATIVE 11/29/2019    CBC: Recent Labs  Lab 03/05/20 0208  WBC 3.8*  HGB 10.2*  HCT 32.5*  MCV 90.8  PLT 121*   Cardiac Enzymes: No results for  input(s): CKTOTAL, CKMB, CKMBINDEX, TROPONINI in the last 168 hours. BNP (last 3 results) No results for input(s): PROBNP in the last 8760 hours. CBG: Recent Labs  Lab 03/04/20 0737 03/04/20 1208 03/04/20 1712 03/04/20 2123 03/05/20 0753  GLUCAP 105* 106* 140* 126* 125*   D-Dimer: No results for input(s): DDIMER in the last 72 hours. Hgb A1c: No results for input(s): HGBA1C in the last 72 hours. Lipid Profile: No results for input(s): CHOL, HDL, LDLCALC, TRIG, CHOLHDL, LDLDIRECT in the last 72 hours. Thyroid function studies: No results for input(s): TSH, T4TOTAL, T3FREE, THYROIDAB in the last 72 hours.  Invalid input(s): FREET3 Anemia work up: No results for input(s): VITAMINB12, FOLATE, FERRITIN, TIBC, IRON, RETICCTPCT in the last 72 hours. Sepsis Labs: Recent Labs  Lab 03/05/20 0208  WBC 3.8*   Microbiology No results found for this or any previous visit (from the past 240 hour(s)).   Medications:   . amLODipine  10 mg Oral Daily  . apixaban  5 mg Oral BID  . atorvastatin  80 mg Oral Daily  . hydrochlorothiazide  25 mg Oral Daily  . lisinopril  40 mg Oral Daily  . magnesium oxide  400 mg Oral BID  . multivitamin with minerals  1 tablet Oral Daily  . sodium chloride flush  3 mL Intravenous Q12H   Continuous Infusions:    LOS: 41 days   Marinda Elk  [facility]  03/05/2020, 10:45 AM

## 2020-03-05 NOTE — Progress Notes (Addendum)
Occupational Therapy Treatment Patient Details Name: Kevin [name] MRN: [id number] DOB: November 15, 1965 Today's Date: 03/05/2020    History of present illness Pt is a 55 y.o. male admitted 01/23/20 with weakness and generalized decline at home; workup for metabolic encephalopathy, hyponatremia, AKI on CKD. 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   OT comments  Pt continues to make progress with functional goals. OT will continue to follow acutely to maximize level of function and safety  Follow Up Recommendations  SNF;Supervision/Assistance - 24 hour (LTC---ALF vs Group home setting)    Equipment Recommendations  None recommended by OT    Recommendations for Other Services      Precautions / Restrictions Precautions Precautions: Fall Precaution Comments: L eye blindness Restrictions Weight Bearing Restrictions: No       Mobility Bed Mobility               General bed mobility comments: pt sitting EOB upon arrival    Transfers Overall transfer level: Needs assistance Equipment used: None Transfers: Sit to/from Stand Sit to Stand: Supervision Stand pivot transfers: Supervision            Balance Overall balance assessment: Needs assistance Sitting-balance support: No upper extremity supported;Feet supported Sitting balance-Leahy Scale: Good     Standing balance support: No upper extremity supported;During functional activity Standing balance-Leahy Scale: Fair                             ADL either performed or assessed with clinical judgement   ADL Overall ADL's : Needs assistance/impaired     Grooming: Wash/dry hands;Wash/dry face;Oral care;Standing;Supervision/safety   Upper Body Bathing: Supervision/ safety;Set up;Sitting   Lower Body Bathing: Supervison/ safety;Sit to/from stand Lower Body Bathing Details (indicate cue type and reason): assist due to decreased  thoroughness Upper Body Dressing : Standing;Supervision/safety;Set up Upper Body Dressing Details (indicate cue type and reason): changed gown     Toilet Transfer: Supervision/safety;Ambulation;Regular Social worker and Hygiene: Supervision/safety       Functional mobility during ADLs: Supervision/safety       Vision Baseline Vision/History: Retinopathy Patient Visual Report: No change from baseline     Perception     Praxis      Cognition Arousal/Alertness: Awake/alert Behavior During Therapy: Flat affect Overall Cognitive Status: Impaired/Different from baseline Area of Impairment: Awareness;Following commands;Attention;Problem solving;Memory;Safety/judgement                     Memory: Decreased short-term memory Following Commands: Follows one step commands with increased time;Follows one step commands consistently Safety/Judgement: Decreased awareness of safety;Decreased awareness of deficits   Problem Solving: Slow processing;Requires verbal cues General Comments: impulsive        Exercises     Shoulder Instructions       General Comments      Pertinent Vitals/ Pain       Pain Assessment: No/denies pain Pain Score: 0-No pain Pain Intervention(s): Monitored during session  Home Living                                          Prior Functioning/Environment              Frequency  Min 1X/week        Progress Toward Goals  OT Goals(current goals can now be found in the care plan section)  Progress towards OT goals: Progressing toward goals  Acute Rehab OT Goals Patient Stated Goal: none stated ADL Goals Pt Will Perform Grooming: with set-up;with modified independence;standing Pt Will Perform Upper Body Bathing: with set-up;with supervision;with modified independence;standing Pt Will Perform Lower Body Bathing: with supervision;with set-up;with modified independence;sit to/from  stand Pt Will Perform Upper Body Dressing: with supervision;with set-up;with modified independence;standing Pt Will Transfer to Toilet: with modified independence;Independently;ambulating;regular height toilet  Plan Discharge plan remains appropriate    Co-evaluation                 AM-PAC OT "6 Clicks" Daily Activity     Outcome Measure   Help from another person eating meals?: None Help from another person taking care of personal grooming?: A Little Help from another person toileting, which includes using toliet, bedpan, or urinal?: A Little Help from another person bathing (including washing, rinsing, drying)?: A Little Help from another person to put on and taking off regular upper body clothing?: None Help from another person to put on and taking off regular lower body clothing?: A Little 6 Click Score: 20    End of Session    OT Visit Diagnosis: Unsteadiness on feet (R26.81);Cognitive communication deficit (R41.841);Low vision, both eyes (H54.2);Other symptoms and signs involving cognitive function;Muscle weakness (generalized) (M62.81);Other abnormalities of gait and mobility (R26.89) Symptoms and signs involving cognitive functions: Cerebral infarction   Activity Tolerance Patient tolerated treatment well   Patient Left in chair;with call bell/phone within reach;with chair alarm set   Nurse Communication Mobility status        Time: 9371-6967 OT Time Calculation (min): 20 min  Charges: OT General Charges $OT Visit: 1 Visit OT Treatments $Self Care/Home Management : 8-22 mins     Galen Manila 03/05/2020, 2:00 PM

## 2020-03-06 ENCOUNTER — Inpatient Hospital Stay: Payer: Self-pay | Admitting: Neurology

## 2020-03-06 NOTE — TOC Progression Note (Signed)
Transition of Care [facility]) - Progression Note    Patient Details  Name: Kevin [name] MRN: [id number] Date of Birth: [birth year]  Transition [facility]) CM/SW Contact  Jimmy Picket, [place] Phone Number: 03/06/2020, 2:55 PM  Clinical Narrative:     CSW spoke to [facility] admission coordinator. They are unable to offer a bed at this time due to pending medicaid and disability. Admission coordinator states referral can be resent once payor source has been established.   CSW also reached out to [facility] about bed they are not accepting new admissions at this time.   TOC will continue to follow.    Expected Discharge Plan: Home w Home Health Services    Expected Discharge Plan and Services Expected Discharge Plan: Home w Home Health Services   Discharge Planning Services: CM Consult Post Acute Care Choice: Home Health,Durable Medical Equipment Living arrangements for the past 2 months: Single Family Home                           HH Arranged: OT,PT HH Agency: [facility] Date [facility] Agency Contacted: 01/28/20 Time HH Agency Contacted: 1425 Representative spoke with at [facility] Agency: Kandee Keen   Social Determinants of Health (SDOH) Interventions    Readmission Risk Interventions Readmission Risk Prevention Plan 01/08/2020  Transportation Screening Complete  PCP or Specialist Appt within 5-7 Days Complete  Home Care Screening Complete  Medication Review (RN CM) Complete  Some recent data might be hidden   Jimmy Picket, Theresia Majors, [facility] Clinical Social Worker [phone]

## 2020-03-06 NOTE — Progress Notes (Signed)
[facility] PROGRESS NOTE    Progress Note  Kevin [name]  SNK:[id number] DOB: [birth year] DOA: 01/23/2020 PCP: Medicine, [facility]     Brief Narrative:   Kevin [name] is an 55 y.o. 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  Assessment/Plan:   Acute metabolic encephalopathy: Possibly secondary to chronic cognitive deficits in the setting of CVA. Her encephalopathy has resolved.  Diabetes mellitus type 2: With an A1c of 5.8, she is not on insulin. Anywhere from 95-1 25.  Essential hypertension: Continue amlodipine hydrochlorothiazide Lexapro.  Chronic systolic heart failure: EF of 93% not on beta-blocker due to bradycardia. Continue lisinopril on hydrochlorothiazide which seems to be controlling her lipids.  Acute kidney injury on chronic kidney C stage IIIa: Likely hypovolemia resolved.  Hypovolemic hypernatremia: Resolved with IV fluid hydration.    DVT prophylaxis: lovenox Family Communication:none Status is: Inpatient  Remains inpatient appropriate because:Hemodynamically unstable   Dispo:  Patient From:    Planned Disposition: To be determined  Expected discharge date: 03/07/2020  Medically stable for discharge:  Yes          Code Status:     Code Status Orders  (From admission, onward)         Start     Ordered   01/23/20 2240  Full code  Continuous        01/23/20 2242        Code Status History    Date Active Date Inactive Code Status Order ID Comments User Context   01/04/2020 2001 01/08/2020 2218 Full Code [id number]  Teddy Spike, DO ED   11/29/2019 0904 12/01/2019 1950 Full Code [id number]  Jonah Blue, MD ED   Advance Care Planning Activity        IV Access:    Peripheral  IV   Procedures and diagnostic studies:   No results found.   Medical Consultants:    None.   Subjective:    Kevin [name] no complaints today.  Objective:    Vitals:   03/05/20 1446 03/05/20 1450 03/05/20 1946 03/06/20 0300  BP: 121/69 (!) 97/54 121/71 122/77  Pulse: 67 64 67 66  Resp: 16 16 17 17   Temp: 98 F (36.7 C) 98.2 F (36.8 C) 98.7 F (37.1 C) 98 F (36.7 C)  TempSrc:  Oral Oral Oral  SpO2: 100% 100% 100% 99%  Weight:      Height:       SpO2: 99 %   Intake/Output Summary (Last 24 hours) at 03/06/2020 03/08/2020 Last data filed at 03/06/2020 0500 Gross per 24 hour  Intake 720 ml  Output 450 ml  Net 270 ml   Filed Weights   02/29/20 0453 03/04/20 0359 03/05/20 0516  Weight: 68.6 kg 69.1 kg 69 kg    Exam: General exam: In no acute distress. Respiratory system: Good air movement and clear to auscultation. Cardiovascular system: S1 & S2 heard, RRR. No JVD. Gastrointestinal system: Abdomen is nondistended, soft and nontender.  Extremities: No pedal edema. Skin: No rashes, lesions or ulcer   Data Reviewed:    Labs: Basic Metabolic Panel: Recent Labs  Lab 03/05/20 0208  NA 138  K 3.5  CL 100  CO2 29  GLUCOSE 99  BUN 28*  CREATININE 1.27*  CALCIUM 8.9  GFR Estimated Creatinine Clearance: 64.1 mL/min (A) (by C-G formula based on SCr of 1.27 mg/dL (H)). Liver Function Tests: No results for input(s): AST, ALT, ALKPHOS, BILITOT, PROT, ALBUMIN in the last 168 hours. No results for input(s): LIPASE, AMYLASE in the last 168 hours. No results for input(s): AMMONIA in the last 168 hours. Coagulation profile No results for input(s): INR, PROTIME in the last 168 hours. COVID-19 Labs  No results for input(s): DDIMER, FERRITIN, LDH, CRP in the last 72 hours.  Lab Results  Component Value Date   SARSCOV2NAA NEGATIVE 01/23/2020   SARSCOV2NAA NEGATIVE 01/04/2020   SARSCOV2NAA NEGATIVE 11/29/2019    CBC: Recent Labs  Lab 03/05/20 0208   WBC 3.8*  HGB 10.2*  HCT 32.5*  MCV 90.8  PLT 121*   Cardiac Enzymes: No results for input(s): CKTOTAL, CKMB, CKMBINDEX, TROPONINI in the last 168 hours. BNP (last 3 results) No results for input(s): PROBNP in the last 8760 hours. CBG: Recent Labs  Lab 03/04/20 0737 03/04/20 1208 03/04/20 1712 03/04/20 2123 03/05/20 0753  GLUCAP 105* 106* 140* 126* 125*   D-Dimer: No results for input(s): DDIMER in the last 72 hours. Hgb A1c: No results for input(s): HGBA1C in the last 72 hours. Lipid Profile: No results for input(s): CHOL, HDL, LDLCALC, TRIG, CHOLHDL, LDLDIRECT in the last 72 hours. Thyroid function studies: No results for input(s): TSH, T4TOTAL, T3FREE, THYROIDAB in the last 72 hours.  Invalid input(s): FREET3 Anemia work up: No results for input(s): VITAMINB12, FOLATE, FERRITIN, TIBC, IRON, RETICCTPCT in the last 72 hours. Sepsis Labs: Recent Labs  Lab 03/05/20 0208  WBC 3.8*   Microbiology No results found for this or any previous visit (from the past 240 hour(s)).   Medications:   . amLODipine  10 mg Oral Daily  . apixaban  5 mg Oral BID  . atorvastatin  80 mg Oral Daily  . hydrochlorothiazide  25 mg Oral Daily  . lisinopril  40 mg Oral Daily  . magnesium oxide  400 mg Oral BID  . multivitamin with minerals  1 tablet Oral Daily  . sodium chloride flush  3 mL Intravenous Q12H   Continuous Infusions:    LOS: 42 days   Marinda Elk  [facility]  03/06/2020, 8:22 AM

## 2020-03-07 DIAGNOSIS — I5022 Chronic systolic (congestive) heart failure: Secondary | ICD-10-CM

## 2020-03-07 DIAGNOSIS — F0280 Dementia in other diseases classified elsewhere without behavioral disturbance: Secondary | ICD-10-CM | POA: Diagnosis present

## 2020-03-07 LAB — BASIC METABOLIC PANEL
Anion gap: 10 (ref 5–15)
BUN: 30 mg/dL — ABNORMAL HIGH (ref 6–20)
CO2: 30 mmol/L (ref 22–32)
Calcium: 9.1 mg/dL (ref 8.9–10.3)
Chloride: 101 mmol/L (ref 98–111)
Creatinine, Ser: 1.31 mg/dL — ABNORMAL HIGH (ref 0.61–1.24)
GFR, Estimated: >60 mL/min (ref >60)
Glucose, Bld: 102 mg/dL — ABNORMAL HIGH (ref 70–99)
Potassium: 3.7 mmol/L (ref 3.5–5.1)
Sodium: 141 mmol/L (ref 135–145)

## 2020-03-07 LAB — GLUCOSE, CAPILLARY: Glucose-Capillary: 94 mg/dL (ref 70–99)

## 2020-03-07 NOTE — Progress Notes (Signed)
Occupational Therapy Treatment Patient Details Name: Kevin [name] MRN: [id number] DOB: [birth year] Today's Date: 03/07/2020    History of present illness Pt is a 55 y.o. male admitted 01/23/20 with weakness and generalized decline at home; workup for metabolic encephalopathy, hyponatremia, AKI on CKD. 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   OT comments  Pt continues to require Sup for safety ith ADL mobility and ADLs in standing. OT will continue to follow acutely to maximize level of function and safety  Follow Up Recommendations  Supervision/Assistance - 24 hour (LTC---ALF vs Group home setting)    Equipment Recommendations  None recommended by OT    Recommendations for Other Services      Precautions / Restrictions Precautions Precautions: Fall Precaution Comments: L eye blindness Restrictions Weight Bearing Restrictions: No       Mobility Bed Mobility Overal bed mobility: Independent Bed Mobility: Sit to Supine;Supine to Sit     Supine to sit: Independent Sit to supine: Independent        Transfers Overall transfer level: Needs assistance Equipment used: None   Sit to Stand: Supervision         General transfer comment: Supervision for safety.    Balance Overall balance assessment: Needs assistance Sitting-balance support: No upper extremity supported;Feet supported Sitting balance-Leahy Scale: Good     Standing balance support: No upper extremity supported;During functional activity Standing balance-Leahy Scale: Fair                             ADL either performed or assessed with clinical judgement   ADL Overall ADL's : Needs assistance/impaired     Grooming: Wash/dry hands;Wash/dry face;Oral care;Standing;Supervision/safety   Upper Body Bathing: Supervision/ safety;Standing   Lower Body Bathing: Supervison/ safety;Sit to/from stand   Upper Body Dressing :  Standing;Supervision/safety   Lower Body Dressing: Sitting/lateral leans;Set up Lower Body Dressing Details (indicate cue type and reason): donned socks Toilet Transfer: Supervision/safety;Ambulation;Regular Toilet   Toileting- Architect and Hygiene: Supervision/safety;Sit to/from stand       Functional mobility during ADLs: Supervision/safety       Vision Patient Visual Report: No change from baseline     Perception     Praxis      Cognition Arousal/Alertness: Awake/alert Behavior During Therapy: Flat affect Overall Cognitive Status: Impaired/Different from baseline Area of Impairment: Awareness;Following commands;Attention;Problem solving;Memory;Safety/judgement                     Memory: Decreased short-term memory Following Commands: Follows one step commands with increased time;Follows one step commands consistently Safety/Judgement: Decreased awareness of safety;Decreased awareness of deficits   Problem Solving: Slow processing;Requires verbal cues          Exercises     Shoulder Instructions       General Comments      Pertinent Vitals/ Pain       Pain Assessment: No/denies pain Faces Pain Scale: No hurt Pain Intervention(s): Monitored during session  Home Living                                          Prior Functioning/Environment              Frequency  Min 1X/week        Progress Toward Goals  OT Goals(current goals can now be found in the care plan section)  Progress towards OT goals: Progressing toward goals  Acute Rehab OT Goals Patient Stated Goal: none stated  Plan Discharge plan remains appropriate    Co-evaluation                 AM-PAC OT "6 Clicks" Daily Activity     Outcome Measure   Help from another person eating meals?: None Help from another person taking care of personal grooming?: A Little Help from another person toileting, which includes using toliet, bedpan,  or urinal?: A Little Help from another person bathing (including washing, rinsing, drying)?: A Little Help from another person to put on and taking off regular upper body clothing?: None Help from another person to put on and taking off regular lower body clothing?: A Little 6 Click Score: 20    End of Session    OT Visit Diagnosis: Unsteadiness on feet (R26.81);Cognitive communication deficit (R41.841);Low vision, both eyes (H54.2);Other symptoms and signs involving cognitive function;Muscle weakness (generalized) (M62.81);Other abnormalities of gait and mobility (R26.89) Symptoms and signs involving cognitive functions: Cerebral infarction   Activity Tolerance Patient tolerated treatment well   Patient Left with call bell/phone within reach;in bed;with bed alarm set   Nurse Communication          Time: 6433-2951 OT Time Calculation (min): 20 min  Charges: OT General Charges $OT Visit: 1 Visit OT Treatments $Self Care/Home Management : 8-22 mins     Galen Manila 03/07/2020, 2:45 PM

## 2020-03-07 NOTE — Progress Notes (Addendum)
CSW spoke with Dennison Bulla of financial counseling who confirm's that there was a Medicaid application submitted for this patient on 01/15/2020.  Edwin Dada, MSW, LCSW Transitions of Care  Clinical Social Worker II [phone]

## 2020-03-07 NOTE — Progress Notes (Signed)
[facility] PROGRESS NOTE  Ramsey Guadamuz [facility] GDJ:[id number] DOB: 04-Jan-1966 DOA: 01/23/2020 PCP: Medicine, [facility]  Status: Remains inpatient appropriate because:Altered mental status and Unsafe d/c plan  Dispo:  Patient From:  HOME  Planned Disposition: To be determined/SNF vs ALF w/ locked dementia unit  Medically stable for discharge:  YES             Barriers to discharge: Patient has underlying dementia and is not safe to discharge home independently.  He does not meet requirements for SNF due to stable mobility but would benefit from ALF that has a locked dementia unit.  Given his mental status issues he would be at risk for medication nonadherence in context of his severe heart failure and hypertension issues which all contributed to his stroke.  He also has diabetes therefore in addition to needing to maintain an appropriate cardiac and fluid restricted diet he also needs to be on a carbohydrate restricted diet to keep diabetes under control to further minimize worsening of cardiovascular disease.             Difficult to place: Yes     Level of care: Telemetry Medical  Code Status: Full Family Communication:  DVT prophylaxis: Eliquis Vaccination status: Unknown  Foley catheter: Condom cath  HPI: 55 y.o. male past medical history of systolic heart failure with an EF of 35% stage IIIa, CVA diabetes mellitus presents secondary to progressive weakness and decline in the setting of recent stroke December 2021.  Patient was recently discharged from the hospital on 12/28 after admission for acute CVA.  He was discharged to home with home health PT/OT/RN and home health aide under the care of his elderly parents.  Patient has a history of noncompliance.  Patient to the hospital family reported generalized decline after being discharged home.  He was supposed to have home health set up but did not have insurance and the home health agency that was working with him  would not accept cash from the family.  The family reported decreased oral intake and patient wincing when swallowing solids although the patient denied pain with swallowing.  He has chronic residual right-sided weakness and cognitive deficits since his stroke.  Completed on date of admission revealed multiple infratentorial and supratentorial remote lacunar infarcts without any acute abnormality  Subjective: Awakened and pleasant.  Very bland affect.  Oriented to name only.  Not aware he is in the hospital and has no recollection of why he would be in the hospital.  Objective: Vitals:   03/06/20 2030 03/07/20 0507  BP: 125/72 (!) 144/80  Pulse: 67 (!) 52  Resp: 17 17  Temp: 98 F (36.7 C) 98 F (36.7 C)  SpO2: 100% 99%    Intake/Output Summary (Last 24 hours) at 03/07/2020 1452 Last data filed at 03/07/2020 0514 Gross per 24 hour  Intake 480 ml  Output --  Net 480 ml   Filed Weights   03/04/20 0359 03/05/20 0516 03/07/20 0500  Weight: 69.1 kg 69 kg 70.3 kg    Exam:  Constitutional: NAD, calm, comfortable Respiratory: clear to auscultation bilaterally, no wheezing, no crackles. Normal respiratory effort.  Cardiovascular: Regular rate and rhythm, no murmurs / rubs / gallops. No extremity edema.  Skin warm and dry Abdomen: no tenderness, no masses palpated. Bowel sounds positive.  Excellent intake of meals.  LBM 2/22 Neurologic: CN 2-12 grossly intact. Sensation intact, DTR normal. Strength 5/5 x on left and slight weakness 4/5 on right. Psychiatric:  Alert and oriented to name only.  Pleasant.  Extreme short-term memory deficits   Assessment/Plan: Acute problems: Acute metabolic encephalopathy in context of dementia due to to other medical condition: Suspect secondary to chronic cognitive deficits in the setting of prior CVA as well as likely vascular dementia underlying diabetes and hypertension.  MRI from December demonstrated moderate chronic small vessel ischemic disease  with chronic lacunar infarcts. Acute encephalopathy has resolved. Is to have significant issues with short-term memory deficits leading to safety concerns.  He is unable to live alone.  In the hospital he has demonstrated wandering behaviors and therefore would best be suited to a locked ALF behavioral/dementia unit. Have asked speech and occupational therapy to perform cognitive evaluations to support this diagnosis and to provide more data If necessary will obtain psychiatric evaluation for capacity  Diabetes mellitus type 2 without hyperglycemia not on long-term insulin: With an A1c of 5.8 Diet controlled  Essential hypertension: Continue amlodipine, hydrochlorothiazide lisinopril  Chronic systolic heart failure: EF of 40% not on beta-blocker due to bradycardia. Continue lisinopril and hydrochlorothiazide  Follow intake and output  History of CVA Continue appropriate blood pressure control Continue Eliquis and Lipitor No chronic focal neurological deficits or gait disturbances noting he ambulates 300 feet with PT  Nutrition Status: Nutrition Problem: Increased nutrient needs Etiology: chronic illness (CHF) Signs/Symptoms: estimated needs Interventions: Magic cup,MVI Estimated body mass index is 21.02 kg/m as calculated from the following:   Height as of this encounter: 6' (1.829 m).   Weight as of this encounter: 70.3 kg.   Other problems: Acute kidney injury on chronic kidney C stage IIIa: Acute hypovolemia resolved.  Hypovolemic hypernatremia: Resolved with IV fluid hydration.    Data Reviewed: Basic Metabolic Panel: Recent Labs  Lab 03/05/20 0208 03/07/20 0118  NA 138 141  K 3.5 3.7  CL 100 101  CO2 29 30  GLUCOSE 99 102*  BUN 28* 30*  CREATININE 1.27* 1.31*  CALCIUM 8.9 9.1   Liver Function Tests: No results for input(s): AST, ALT, ALKPHOS, BILITOT, PROT, ALBUMIN in the last 168 hours. No results for input(s): LIPASE, AMYLASE in the last 168  hours. No results for input(s): AMMONIA in the last 168 hours. CBC: Recent Labs  Lab 03/05/20 0208  WBC 3.8*  HGB 10.2*  HCT 32.5*  MCV 90.8  PLT 121*   Cardiac Enzymes: No results for input(s): CKTOTAL, CKMB, CKMBINDEX, TROPONINI in the last 168 hours. BNP (last 3 results) Recent Labs    11/29/19 0037 01/04/20 1339 01/07/20 1549  BNP 1,278.7* 465.9* 46.1    ProBNP (last 3 results) No results for input(s): PROBNP in the last 8760 hours.  CBG: Recent Labs  Lab 03/04/20 1208 03/04/20 1712 03/04/20 2123 03/05/20 0753 03/07/20 0502  GLUCAP 106* 140* 126* 125* 94    No results found for this or any previous visit (from the past 240 hour(s)).   Studies: No results found.  Scheduled Meds: . amLODipine  10 mg Oral Daily  . apixaban  5 mg Oral BID  . atorvastatin  80 mg Oral Daily  . hydrochlorothiazide  25 mg Oral Daily  . lisinopril  40 mg Oral Daily  . magnesium oxide  400 mg Oral BID  . multivitamin with minerals  1 tablet Oral Daily  . 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

## 2020-03-07 NOTE — TOC Progression Note (Signed)
Transition of Care [facility]) - Progression Note    Patient Details  Name: Kevin [name] MRN: [id number] Date of Birth: September 23, 1965  Transition of Care [facility]) CM/SW Contact  Curlene Labrum, RN Phone Number: 03/07/2020, 3:05 PM  Clinical Narrative:    Case management met with the patient at the bedside regarding transitions of care.  The patient has history of CVA and lacks short term memory per MD notes.  The patient's mother is aware of patient's need for LTC placement at a skilled nursing facility and will need a memory care / locked unit since he has history of wandering and no short term memory.  The patient's family was reported to have started the patient's Medicaid application on 6/38/1771 and has pending approval for disability through the [facility].  [facility] is unable to accept the patient for admission at this time per [facility].  Also, I called [facility] and left a message with the facility for possible bed availability.  I called and spoke with Mateo Flow, CM at [facility] in [place], [place] and they are unable to offer a bed to the patient at this time since the [facility] and disability are pending at this time according to the CM.  Val, CM with [facility] states to call back and speak with Ebony Hail, CM at [facility] and check on bed availability at the [facility].  CM and MSW will continue to follow the patient for LTC SNF placement for Long Term Care Placement.   Expected Discharge Plan: Long Term Nursing Home Barriers to Discharge: SNF Pending bed offer,SNF Pending payor source - LOG,SNF Pending Medicaid  Expected Discharge Plan and Services Expected Discharge Plan: [place] In-house Referral: Clinical Social Work Discharge Planning Services: CM Consult Post Acute Care Choice: Nursing Home ([place]) Living arrangements for the past 2 months: [place]:  OT,PT [place]: [place] Date [place]: 01/28/20 Time [place]: 1657 Representative spoke with at [place]: [place] ([place]) Interventions    Readmission Risk Interventions Readmission Risk Prevention Plan 01/08/2020  Transportation Screening Complete  PCP or Specialist Appt within 5-7 Days Complete  Home Care Screening Complete  Medication Review (RN CM) Complete  Some recent data might be hidden

## 2020-03-08 LAB — GLUCOSE, CAPILLARY: Glucose-Capillary: 93 mg/dL (ref 70–99)

## 2020-03-08 MED ORDER — HYDRALAZINE HCL 10 MG PO TABS
10.0000 mg | ORAL_TABLET | Freq: Three times a day (TID) | ORAL | Status: DC
  Administered 2020-03-08 – 2020-03-19 (×33): 10 mg via ORAL
  Filled 2020-03-08 (×33): qty 1

## 2020-03-08 NOTE — Progress Notes (Signed)
[facility] PROGRESS NOTE    Progress Note  Dhruva Orndoff [facility]  QIO:[id number] DOB: Dec 21, 1965 DOA: 01/23/2020 PCP: Medicine, [facility]     Brief Narrative:   Zyrion Coey Alms is an 55 y.o. 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  Assessment/Plan:   Acute metabolic encephalopathy: Possibly secondary to chronic cognitive deficits in the setting of CVA. Her encephalopathy has resolved.  Diabetes mellitus type 2: With an A1c of 5.8, she is not on insulin. Anywhere from 95-1 25.  Essential hypertension: Continue amlodipine hydrochlorothiazide Lisinopril. Blood pressure still elevated will add low-dose hydralazine.  Chronic systolic heart failure: EF of 32% not on beta-blocker due to bradycardia. Continue lisinopril and hydrochlorothiazide while low-dose hydralazine. Not a candidate for beta-blockers due to bradycardia.  Acute kidney injury on chronic kidney C stage IIIa: Likely hypovolemia resolved.  Hypovolemic hypernatremia: Resolved with IV fluid hydration.    DVT prophylaxis: lovenox Family Communication:none Status is: Inpatient  Remains inpatient appropriate because:Hemodynamically unstable   Dispo:  Patient From:    Planned Disposition: To be determined  Expected discharge date: 03/07/2020  Medically stable for discharge:  Yes          Code Status:     Code Status Orders  (From admission, onward)         Start     Ordered   01/23/20 2240  Full code  Continuous        01/23/20 2242        Code Status History    Date Active Date Inactive Code Status Order ID Comments User Context   01/04/2020 2001 01/08/2020 2218 Full Code [id number]  Teddy Spike, DO ED   11/29/2019 0904 12/01/2019 1950 Full Code [id number]  Jonah Blue, MD ED   Advance Care Planning Activity        IV Access:    Peripheral IV   Procedures and diagnostic studies:   No results found.   Medical Consultants:    None.   Subjective:    NASHAUN HILLMER no complaints today.  Objective:    Vitals:   03/07/20 0500 03/07/20 0507 03/07/20 2033 03/08/20 0525  BP:  (!) 144/80 (!) 146/77 (!) 151/86  Pulse:  (!) 52 (!) 53 (!) 53  Resp:  17 18 18   Temp:  98 F (36.7 C) 98.3 F (36.8 C) 97.7 F (36.5 C)  TempSrc:  Oral Oral Oral  SpO2:  99% 100% 100%  Weight: 70.3 kg     Height:       SpO2: 100 %   Intake/Output Summary (Last 24 hours) at 03/08/2020 0910 Last data filed at 03/08/2020 0730 Gross per 24 hour  Intake 0 ml  Output --  Net 0 ml   Filed Weights   03/04/20 0359 03/05/20 0516 03/07/20 0500  Weight: 69.1 kg 69 kg 70.3 kg    Exam: General exam: In no acute distress. Respiratory system: Good air movement and clear to auscultation. Cardiovascular system: S1 & S2 heard, RRR. No JVD. Gastrointestinal system: Abdomen is nondistended, soft and nontender.  Extremities: No pedal edema. Skin: No rashes, lesions or ulcers  Data Reviewed:    Labs: Basic Metabolic Panel: Recent Labs  Lab 03/05/20 0208 03/07/20 0118  NA 138 141  K 3.5 3.7  CL 100  101  CO2 29 30  GLUCOSE 99 102*  BUN 28* 30*  CREATININE 1.27* 1.31*  CALCIUM 8.9 9.1   GFR Estimated Creatinine Clearance: 63.4 mL/min (A) (by C-G formula based on SCr of 1.31 mg/dL (H)). Liver Function Tests: No results for input(s): AST, ALT, ALKPHOS, BILITOT, PROT, ALBUMIN in the last 168 hours. No results for input(s): LIPASE, AMYLASE in the last 168 hours. No results for input(s): AMMONIA in the last 168 hours. Coagulation profile No results for input(s): INR, PROTIME in the last 168 hours. COVID-19 Labs  No results for input(s): DDIMER, FERRITIN, LDH, CRP in the last 72 hours.  Lab Results  Component Value Date   SARSCOV2NAA  NEGATIVE 01/23/2020   SARSCOV2NAA NEGATIVE 01/04/2020   SARSCOV2NAA NEGATIVE 11/29/2019    CBC: Recent Labs  Lab 03/05/20 0208  WBC 3.8*  HGB 10.2*  HCT 32.5*  MCV 90.8  PLT 121*   Cardiac Enzymes: No results for input(s): CKTOTAL, CKMB, CKMBINDEX, TROPONINI in the last 168 hours. BNP (last 3 results) No results for input(s): PROBNP in the last 8760 hours. CBG: Recent Labs  Lab 03/04/20 1712 03/04/20 2123 03/05/20 0753 03/07/20 0502 03/08/20 0541  GLUCAP 140* 126* 125* 94 93   D-Dimer: No results for input(s): DDIMER in the last 72 hours. Hgb A1c: No results for input(s): HGBA1C in the last 72 hours. Lipid Profile: No results for input(s): CHOL, HDL, LDLCALC, TRIG, CHOLHDL, LDLDIRECT in the last 72 hours. Thyroid function studies: No results for input(s): TSH, T4TOTAL, T3FREE, THYROIDAB in the last 72 hours.  Invalid input(s): FREET3 Anemia work up: No results for input(s): VITAMINB12, FOLATE, FERRITIN, TIBC, IRON, RETICCTPCT in the last 72 hours. Sepsis Labs: Recent Labs  Lab 03/05/20 0208  WBC 3.8*   Microbiology No results found for this or any previous visit (from the past 240 hour(s)).   Medications:   . amLODipine  10 mg Oral Daily  . apixaban  5 mg Oral BID  . atorvastatin  80 mg Oral Daily  . hydrochlorothiazide  25 mg Oral Daily  . lisinopril  40 mg Oral Daily  . magnesium oxide  400 mg Oral BID  . multivitamin with minerals  1 tablet Oral Daily  . sodium chloride flush  3 mL Intravenous Q12H   Continuous Infusions:    LOS: 44 days   Marinda Elk  [facility]  03/08/2020, 9:10 AM

## 2020-03-08 NOTE — Plan of Care (Signed)
  Problem: Clinical Measurements: Goal: Will remain free from infection Outcome: Progressing Goal: Diagnostic test results will improve Outcome: Progressing Goal: Cardiovascular complication will be avoided Outcome: Progressing   

## 2020-03-08 NOTE — Evaluation (Signed)
Speech Language Pathology Evaluation Patient Details Name: Kevin [name] MRN: [id number] DOB: December 12, 1965 Today's Date: 03/08/2020 Time: 8413-2440 SLP Time Calculation (min) (ACUTE ONLY): 16 min  Problem List:  Patient Active Problem List   Diagnosis Date Noted  . 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 Acute hypernatremia 01/24/2020  . Hypernatremia 01/23/2020  . Chronic CHF (congestive heart failure) ([place]) 01/23/2020  . CKD (chronic kidney disease), stage III ([place]) 01/08/2020  . Essential hypertension 01/05/2020  . Acute on chronic combined systolic and diastolic CHF (congestive heart failure) ([place]) 01/05/2020  . History of noncompliance with medical treatment 01/05/2020  . CVA (cerebral vascular accident) ([place]) 01/04/2020  . History of CVA (cerebrovascular accident) 01/04/2020  . Hypertensive emergency 11/29/2019  . Tobacco dependence 11/29/2019  . Marijuana abuse 11/29/2019  . Renal dysfunction 11/29/2019  . Hypokalemia 11/29/2019  . Diabetes mellitus without complication [facility])    Past Medical History:  Past Medical History:  Diagnosis Date  . Blind left eye   . CKD (chronic kidney disease), stage III ([place])   . CVA (cerebral vascular accident) ([place])   . Diabetes mellitus without complication ([place])   . Hypertension    Past Surgical History:  Past Surgical History:  Procedure Laterality Date  . BUBBLE STUDY  01/07/2020   Procedure: BUBBLE STUDY;  Surgeon: Jerline Pain, MD;  Location: [facility] ENDOSCOPY;  Service: Cardiovascular;;  . GYNECOMASTIA EXCISION    . TEE WITHOUT CARDIOVERSION N/A 01/07/2020   Procedure: TRANSESOPHAGEAL ECHOCARDIOGRAM (TEE);  Surgeon: Jerline Pain, MD;  Location: [facility] ENDOSCOPY;  Service: Cardiovascular;  Laterality: N/A;   HPI:  Pt is a 55 y.o. male with medical history significant of systolic and diastolic heart failure, CKD, CVA with cognitive impairment, diabetes, hypertension, left eye blindness who presented  secondary to weakness and generalized decline at home. On admission, pt's family reported decreased p.o. intake and wincing when swallowing solids though he has denied odynophagia. CXR 1/12 was negative.   Assessment / Plan / Recommendation Clinical Impression  Pt was seen for a cognitive-linguistic evaluation.  Most recent evaluation was on 01/08/2020 with reported cognitive deficits "including reduced attention, reduced problem solving, reduced safety awareness, decreased executive function skills and disorientation."  Per chart review, pt has a history of short-term memory deficits and will wander (decreased safety awareness).  Pt reported that he lives with his parents and that they assist him with medications and finances.  Pt completed the [facility] SLUMS evaluation, assessing for Mild Neurocognitive Disorder and Dementia on this date.  Pt scored overall 5/30, which falls within the Dementia range (1-20).  Patient exhibited global cognitive deficits in orientation, short-term memory, attention, problem solving, safety judgement, and executive functioning.  Please see below for additional information.  Pt will benefit from 24/7 supervision and full assistance with IADLs (medications, finances, etc.) at time of discharge.  All further skilled ST needs can be met at the next venue of care.  Please re-consult if additional needs arise.   [place] Examination Orientation 1/3  Numeric Problem Solving  0/3  Memory  0/5  Attention 0/2  Thought Organization 1/3  Clock Drawing 0/4  Short Story Recall  2/8  Visuospatial Skills  1/2  Total  5/30 (Dementia Range)    Scoring  High School Education  Less than High School Education   Normal  27-30 25-30  Mild Neurocognitive Disorder 21-26 20-24  Dementia  1-20 1-19       SLP Assessment  SLP Recommendation/Assessment:  Patient needs continued Speech Lanaguage Pathology Services SLP Visit Diagnosis: Cognitive communication deficit (R41.841)    Follow Up  Recommendations  Skilled Nursing facility;24 hour supervision/assistance          SLP Evaluation Cognition  Overall Cognitive Status: Impaired/Different from baseline Arousal/Alertness: Awake/alert Orientation Level: Oriented to person;Disoriented to situation Attention: Focused;Sustained Focused Attention: Impaired Focused Attention Impairment: Verbal basic;Functional basic Sustained Attention: Impaired Memory: Impaired Awareness: Impaired Problem Solving: Impaired Executive Function: Organizing;Reasoning;Sequencing Reasoning: Impaired Reasoning Impairment: Verbal basic;Functional basic Sequencing: Impaired Sequencing Impairment: Verbal basic;Functional basic Organizing: Impaired Safety/Judgment: Impaired       Comprehension  Auditory Comprehension Overall Auditory Comprehension: Appears within functional limits for tasks assessed    Expression Expression Primary Mode of Expression: Verbal Verbal Expression Overall Verbal Expression: Impaired Naming: Impairment Divergent: 25-49% accurate Interfering Components: Attention Written Expression Dominant Hand: Right Written Expression: Not tested   Oral / Motor  Oral Motor/Sensory Function Overall Oral Motor/Sensory Function: Within functional limits Motor Speech Overall Motor Speech: Appears within functional limits for tasks assessed   GO                   Colin Mulders., M.S., [place] [facility] Office: [phone]  Elvia Collum [facility] 03/08/2020, 11:53 AM

## 2020-03-09 NOTE — Progress Notes (Signed)
[facility] PROGRESS NOTE    Progress Note  Isahia Hollerbach [facility]  RFF:[id number] DOB: 01-Feb-1965 DOA: 01/23/2020 PCP: Medicine, [facility]     Brief Narrative:   Aston Lieske Griebel is an 55 y.o. 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  Assessment/Plan:   Acute metabolic encephalopathy: Possibly secondary to chronic cognitive deficits in the setting of CVA. Her encephalopathy has resolved.  Diabetes mellitus type 2: With an A1c of 5.8, she is not on insulin. Anywhere from 95-1 25.  Essential hypertension: Continue amlodipine hydrochlorothiazide Lisinopril. Blood pressure still elevated will add low-dose hydralazine.  Chronic systolic heart failure: EF of 35% not on beta-blocker due to bradycardia. Continue lisinopril and hydrochlorothiazide while low-dose hydralazine. Not a candidate for beta-blockers due to bradycardia.  Acute kidney injury on chronic kidney C stage IIIa: Likely hypovolemia resolved.  Hypovolemic hypernatremia: Resolved with IV fluid hydration.    DVT prophylaxis: lovenox Family Communication:none Status is: Inpatient  Remains inpatient appropriate because:Hemodynamically unstable   Dispo:  Patient From:    Planned Disposition: To be determined  Expected discharge date: 03/07/2020  Medically stable for discharge:  Yes     Code Status:     Code Status Orders  (From admission, onward)         Start     Ordered   01/23/20 2240  Full code  Continuous        01/23/20 2242        Code Status History    Date Active Date Inactive Code Status Order ID Comments User Context   01/04/2020 2001 01/08/2020 2218 Full Code [id number]  Teddy Spike, DO ED   11/29/2019 0904 12/01/2019 1950 Full Code [id number]  Jonah Blue,  MD ED   Advance Care Planning Activity        IV Access:    Peripheral IV   Procedures and diagnostic studies:   No results found.   Medical Consultants:    None.   Subjective:    VYNCENT OVERBY no complaints today.  Objective:    Vitals:   03/08/20 1420 03/08/20 2035 03/09/20 0444 03/09/20 0535  BP: 109/63 (!) 144/86 129/81   Pulse: 64 (!) 54 (!) 50   Resp: 18 17 18    Temp: 98.5 F (36.9 C) (!) 97.5 F (36.4 C) 98.1 F (36.7 C)   TempSrc:  Oral Oral   SpO2: 100% 100% 100%   Weight:    69.1 kg  Height:       SpO2: 100 %   Intake/Output Summary (Last 24 hours) at 03/09/2020 0949 Last data filed at 03/08/2020 1700 Gross per 24 hour  Intake 320 ml  Output --  Net 320 ml   Filed Weights   03/05/20 0516 03/07/20 0500 03/09/20 0535  Weight: 69 kg 70.3 kg 69.1 kg    Exam: General exam: In no acute distress. Respiratory system: Good air movement and clear to auscultation. Cardiovascular system: S1 & S2 heard, RRR. No JVD. Gastrointestinal system: Abdomen is nondistended, soft and nontender.  Extremities: No pedal edema. Skin: No rashes, lesions or ulcers  Data Reviewed:    Labs: Basic Metabolic Panel: Recent Labs  Lab 03/05/20 0208 03/07/20 0118  NA 138 141  K 3.5 3.7  CL 100 101  CO2 29 30  GLUCOSE  99 102*  BUN 28* 30*  CREATININE 1.27* 1.31*  CALCIUM 8.9 9.1   GFR Estimated Creatinine Clearance: 62.3 mL/min (A) (by C-G formula based on SCr of 1.31 mg/dL (H)). Liver Function Tests: No results for input(s): AST, ALT, ALKPHOS, BILITOT, PROT, ALBUMIN in the last 168 hours. No results for input(s): LIPASE, AMYLASE in the last 168 hours. No results for input(s): AMMONIA in the last 168 hours. Coagulation profile No results for input(s): INR, PROTIME in the last 168 hours. COVID-19 Labs  No results for input(s): DDIMER, FERRITIN, LDH, CRP in the last 72 hours.  Lab Results  Component Value Date   SARSCOV2NAA NEGATIVE 01/23/2020    SARSCOV2NAA NEGATIVE 01/04/2020   SARSCOV2NAA NEGATIVE 11/29/2019    CBC: Recent Labs  Lab 03/05/20 0208  WBC 3.8*  HGB 10.2*  HCT 32.5*  MCV 90.8  PLT 121*   Cardiac Enzymes: No results for input(s): CKTOTAL, CKMB, CKMBINDEX, TROPONINI in the last 168 hours. BNP (last 3 results) No results for input(s): PROBNP in the last 8760 hours. CBG: Recent Labs  Lab 03/04/20 1712 03/04/20 2123 03/05/20 0753 03/07/20 0502 03/08/20 0541  GLUCAP 140* 126* 125* 94 93   D-Dimer: No results for input(s): DDIMER in the last 72 hours. Hgb A1c: No results for input(s): HGBA1C in the last 72 hours. Lipid Profile: No results for input(s): CHOL, HDL, LDLCALC, TRIG, CHOLHDL, LDLDIRECT in the last 72 hours. Thyroid function studies: No results for input(s): TSH, T4TOTAL, T3FREE, THYROIDAB in the last 72 hours.  Invalid input(s): FREET3 Anemia work up: No results for input(s): VITAMINB12, FOLATE, FERRITIN, TIBC, IRON, RETICCTPCT in the last 72 hours. Sepsis Labs: Recent Labs  Lab 03/05/20 0208  WBC 3.8*   Microbiology No results found for this or any previous visit (from the past 240 hour(s)).   Medications:   . amLODipine  10 mg Oral Daily  . apixaban  5 mg Oral BID  . atorvastatin  80 mg Oral Daily  . hydrALAZINE  10 mg Oral Q8H  . hydrochlorothiazide  25 mg Oral Daily  . lisinopril  40 mg Oral Daily  . magnesium oxide  400 mg Oral BID  . multivitamin with minerals  1 tablet Oral Daily  . sodium chloride flush  3 mL Intravenous Q12H   Continuous Infusions:    LOS: 45 days   Marinda Elk  [facility]  03/09/2020, 9:49 AM

## 2020-03-10 LAB — GLUCOSE, CAPILLARY: Glucose-Capillary: 95 mg/dL (ref 70–99)

## 2020-03-10 NOTE — Progress Notes (Signed)
[facility] PROGRESS NOTE  Kevin [name] [facility] YTK:[id number] DOB: 04-04-1965 DOA: 01/23/2020 PCP: Medicine, [facility]  Status: Remains inpatient appropriate because:Altered mental status and Unsafe d/c plan  Dispo:  Patient From:  HOME  Planned Disposition: To be determined/SNF vs ALF w/ locked dementia unit  Medically stable for discharge:  YES             Barriers to discharge: Patient has underlying dementia and is not safe to discharge home independently.  He does not meet requirements for SNF due to stable mobility but would benefit from ALF that has a locked dementia unit.  Given his mental status issues he would be at risk for medication nonadherence in context of his severe heart failure and hypertension issues which all contributed to his stroke.  He also has diabetes therefore in addition to needing to maintain an appropriate cardiac and fluid restricted diet he also needs to be on a carbohydrate restricted diet to keep diabetes under control to further minimize worsening of cardiovascular disease.             Difficult to place: Yes     Level of care: Telemetry Medical  Code Status: Full Family Communication:  DVT prophylaxis: Eliquis Vaccination status: Unknown  Foley catheter: Condom cath  HPI: 55 y.o. male past medical history of systolic heart failure with an EF of 35% stage IIIa, CVA diabetes mellitus presents secondary to progressive weakness and decline in the setting of recent stroke December 2021.  Patient was recently discharged from the hospital on 12/28 after admission for acute CVA.  He was discharged to home with home health PT/OT/RN and home health aide under the care of his elderly parents.  Patient has a history of noncompliance.  Patient to the hospital family reported generalized decline after being discharged home.  He was supposed to have home health set up but did not have insurance and the home health agency that was working with him  would not accept cash from the family.  The family reported decreased oral intake and patient wincing when swallowing solids although the patient denied pain with swallowing.  He has chronic residual right-sided weakness and cognitive deficits since his stroke.  Completed on date of admission revealed multiple infratentorial and supratentorial remote lacunar infarcts without any acute abnormality  Subjective: Awake.  No specific complaints reported.  Objective: Vitals:   03/09/20 2057 03/10/20 0415  BP: 103/78 130/87  Pulse: 67 64  Resp: 15 18  Temp: 98.1 F (36.7 C) 98.7 F (37.1 C)  SpO2: 100% 100%    Intake/Output Summary (Last 24 hours) at 03/10/2020 0810 Last data filed at 03/10/2020 0400 Gross per 24 hour  Intake 420 ml  Output --  Net 420 ml   Filed Weights   03/07/20 0500 03/09/20 0535 03/10/20 0500  Weight: 70.3 kg 69.1 kg 69.1 kg    Exam:  Constitutional: Alert, pleasant, no acute distress Respiratory: Anterior are lung sounds clear.  Stable on room air Cardiovascular: Normal heart sounds, no S3, no peripheral edema or JVD.  Extremities warm. Abdomen: Soft nontender nondistended with normoactive bowel sounds.  LBM 2/22 Neurologic: CN 2-12 grossly intact. Sensation intact, DTR normal. Strength 5/5 x on left and slight weakness 4/5 on right. Psychiatric: Awake.  Oriented to name, knew he was at a hospital but did not know which hospital.  Stated it was 2021.  Could not tell me why he was in the hospital.   Assessment/Plan: Acute problems: Acute  metabolic encephalopathy (resolved) in context of dementia due to to other medical condition: Suspect secondary to chronic cognitive deficits in the setting of prior CVA as well as likely vascular dementia underlying 2/2 diabetes and hypertension.  MRI from December demonstrated moderate chronic small vessel ischemic disease with chronic lacunar infarcts. Acute encephalopathy has resolved. Is to have significant issues with  short-term memory deficits leading to safety concerns.  He is unable to live alone.  In the hospital he has demonstrated wandering behaviors and therefore would best be suited to a locked ALF behavioral/dementia unit. 2/26: SLP cognitive evaluation as follows:  Pt was seen for a cognitive-linguistic evaluation.  Most recent evaluation was on 01/08/2020 with reported cognitive deficits "including reduced attention, reduced problem solving, reduced safety awareness, decreased executive function skills and disorientation."  Per chart review, pt has a history of short-term memory deficits and will wander (decreased safety awareness).  Pt reported that he lives with his parents and that they assist him with medications and finances.  Pt completed the [facility] evaluation, assessing for Mild Neurocognitive Disorder and Dementia on this date.  Pt scored overall 5/30, which falls within the Dementia range (1-20).  Patient exhibited global cognitive deficits in orientation, short-term memory, attention, problem solving, safety judgement, and executive functioning.  Please see below for additional information.  Pt will benefit from 24/7 supervision and full assistance with IADLs (medications, finances, etc.) at time of discharge.  All further skilled ST needs can be met at the next venue of care.  Please re-consult if additional needs arise.   [place] Examination Orientation 1/3  Numeric Problem Solving  0/3  Memory  0/5  Attention 0/2  Thought Organization 1/3  Clock Drawing 0/4  Short Story Recall  2/8  Visuospatial Skills  1/2  Total  5/30 (Dementia Range)    Scoring  High School Education  Less than High School Education   Normal  27-30 25-30  Mild Neurocognitive Disorder 21-26 20-24  Dementia  1-20 1-19    2/25: OT cognitive evaluation Arousal/Alertness: Awake/alert Behavior During Therapy: Flat affect Overall Cognitive Status: Impaired/Different from baseline Area of Impairment:  Awareness;Following commands;Attention;Problem solving;Memory;Safety/judgement Memory: Decreased short-term memory Following Commands: Follows one step commands with increased time;Follows one step commands consistently Safety/Judgement: Decreased awareness of safety;Decreased awareness of deficits Problem Solving: Slow processing;Requires verbal cues  Diabetes mellitus type 2 without hyperglycemia not on long-term insulin: With an A1c of 5.8 Diet controlled  Essential hypertension: Continue amlodipine, hydrochlorothiazide lisinopril  Chronic systolic heart failure: EF of 20% not on beta-blocker due to bradycardia. Continue lisinopril and hydrochlorothiazide  Follow intake and output  History of CVA Continue appropriate blood pressure control Continue Eliquis and Lipitor No chronic focal neurological deficits or gait disturbances noting he ambulates 300 feet with PT  Nutrition Status: Nutrition Problem: Increased nutrient needs Etiology: chronic illness (CHF) Signs/Symptoms: estimated needs Interventions: Magic cup,MVI Estimated body mass index is 20.66 kg/m as calculated from the following:   Height as of this encounter: 6' (1.829 m).   Weight as of this encounter: 69.1 kg.   Other problems: Acute kidney injury on chronic kidney C stage IIIa: Acute hypovolemia resolved.  Hypovolemic hypernatremia: Resolved with IV fluid hydration.    Data Reviewed: Basic Metabolic Panel: Recent Labs  Lab 03/05/20 0208 03/07/20 0118  NA 138 141  K 3.5 3.7  CL 100 101  CO2 29 30  GLUCOSE 99 102*  BUN 28* 30*  CREATININE 1.27* 1.31*  CALCIUM 8.9 9.1  Liver Function Tests: No results for input(s): AST, ALT, ALKPHOS, BILITOT, PROT, ALBUMIN in the last 168 hours. No results for input(s): LIPASE, AMYLASE in the last 168 hours. No results for input(s): AMMONIA in the last 168 hours. CBC: Recent Labs  Lab 03/05/20 0208  WBC 3.8*  HGB 10.2*  HCT 32.5*  MCV 90.8  PLT  121*   Cardiac Enzymes: No results for input(s): CKTOTAL, CKMB, CKMBINDEX, TROPONINI in the last 168 hours. BNP (last 3 results) Recent Labs    11/29/19 0037 01/04/20 1339 01/07/20 1549  BNP 1,278.7* 465.9* 46.1    ProBNP (last 3 results) No results for input(s): PROBNP in the last 8760 hours.  CBG: Recent Labs  Lab 03/04/20 2123 03/05/20 0753 03/07/20 0502 03/08/20 0541 03/10/20 0453  GLUCAP 126* 125* 94 93 95    No results found for this or any previous visit (from the past 240 hour(s)).   Studies: No results found.  Scheduled Meds: . amLODipine  10 mg Oral Daily  . apixaban  5 mg Oral BID  . atorvastatin  80 mg Oral Daily  . hydrALAZINE  10 mg Oral Q8H  . hydrochlorothiazide  25 mg Oral Daily  . lisinopril  40 mg Oral Daily  . magnesium oxide  400 mg Oral BID  . multivitamin with minerals  1 tablet Oral Daily  . 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

## 2020-03-11 LAB — GLUCOSE, CAPILLARY: Glucose-Capillary: 102 mg/dL — ABNORMAL HIGH (ref 70–99)

## 2020-03-11 NOTE — Progress Notes (Signed)
Physical Therapy Treatment Patient Details Name: Kevin [name] MRN: [id number] DOB: 12-Feb-1965 Today's Date: 03/11/2020    History of Present Illness Pt is a 55 y.o. male admitted 01/23/20 with weakness and generalized decline at home; workup for metabolic encephalopathy, hyponatremia, AKI on CKD. 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    PT Comments    Pt seated in recliner.  He continues to improve functional but based on poor cognition he continues to require higher level of care at d/c for safety.  Will continue to follow 1x week during acute stay.    Follow Up Recommendations  SNF (vs. ALF for long term care based on cognitive deficits.)     Equipment Recommendations       Recommendations for Other Services       Precautions / Restrictions Precautions Precautions: Fall Precaution Comments: L eye blindness Restrictions Weight Bearing Restrictions: No    Mobility  Bed Mobility               General bed mobility comments: Pt sitting in recliner on arrival this session.    Transfers Overall transfer level: Independent Equipment used: None Transfers: Sit to/from Stand Sit to Stand: Independent            Ambulation/Gait Ambulation/Gait assistance: Supervision Gait Distance (Feet): 650 Feet Assistive device: None Gait Pattern/deviations: Step-through pattern;Narrow base of support;Drifts right/left;Scissoring     General Gait Details: Pt drifting to R and L when performing horizontal and vertical head turns. Requiring supervision when performing dynamic gait tasks. Continues to require assistance to locate room.  Pt with mild scissoring but not overt LOB.   Stairs             Wheelchair Mobility    Modified Rankin (Stroke Patients Only)       Balance Overall balance assessment: Needs assistance Sitting-balance support: No upper extremity supported;Feet supported Sitting  balance-Leahy Scale: Good       Standing balance-Leahy Scale: Fair Standing balance comment: Can static stand and ambulate without UE support                            Cognition Arousal/Alertness: Awake/alert Behavior During Therapy: Impulsive Overall Cognitive Status: Impaired/Different from baseline Area of Impairment: Problem solving                             Problem Solving: Requires verbal cues;Requires tactile cues        Exercises      General Comments        Pertinent Vitals/Pain Pain Assessment: No/denies pain Faces Pain Scale: No hurt    Home Living                      Prior Function            PT Goals (current goals can now be found in the care plan section) Acute Rehab PT Goals Patient Stated Goal: none stated Potential to Achieve Goals: Fair Additional Goals Additional Goal #1: Pt will score >19 on DGI to indicate low fall risk Progress towards PT goals: Progressing toward goals    Frequency    Min 1X/week      PT Plan Current plan remains appropriate    Co-evaluation              AM-PAC PT "  6 Clicks" Mobility   Outcome Measure                   End of Session   Activity Tolerance: Patient tolerated treatment well Patient left: in bed;with call bell/phone within reach Nurse Communication: Mobility status PT Visit Diagnosis: Unsteadiness on feet (R26.81);Muscle weakness (generalized) (M62.81)     Time: 5852-7782 PT Time Calculation (min) (ACUTE ONLY): 18 min  Charges:  $Gait Training: 8-22 mins                     Bonney Leitz , PTA Acute Rehabilitation Services Pager [phone] Office [phone]     Adilson Grafton Artis Delay 03/11/2020, 3:37 PM

## 2020-03-11 NOTE — Plan of Care (Signed)

## 2020-03-11 NOTE — Progress Notes (Signed)
[facility] PROGRESS NOTE  Jadon Harbaugh [facility] WUJ:[id number] DOB: February 16, 1965 DOA: 01/23/2020 PCP: [facility], [facility]  Status: Remains inpatient appropriate because:Altered mental status and Unsafe d/c plan  Dispo:  Patient From:  HOME  Planned Disposition: To be determined/SNF vs ALF w/ locked dementia unit  Medically stable for discharge:  YES             Barriers to discharge: Patient has underlying dementia and is not safe to discharge home independently.  He does not meet requirements for SNF due to stable mobility but would benefit from ALF that has a locked dementia unit.  Given his mental status issues he would be at risk for medication nonadherence in context of his severe heart failure and hypertension issues which all contributed to his stroke.  He also has diabetes therefore in addition to needing to maintain an appropriate cardiac and fluid restricted diet he also needs to be on a carbohydrate restricted diet to keep diabetes under control to further minimize worsening of cardiovascular disease.             Difficult to place: Yes     Level of care: Telemetry Medical  Code Status: Full Family Communication: HIPAA appropriate voicemail left for patient's mother [name]-will try to recontact mother on 3/2 DVT prophylaxis: Eliquis Vaccination status: Unknown  Foley catheter: Condom cath  HPI: 55 y.o. male past medical history of systolic heart failure with an EF of 35% stage IIIa, CVA diabetes mellitus presents secondary to progressive weakness and decline in the setting of recent stroke December 2021.  Patient was recently discharged from the hospital on 12/28 after admission for acute CVA.  He was discharged to home with home health PT/OT/RN and home health aide under the care of his elderly parents.  Patient has a history of noncompliance.  Patient to the hospital family reported generalized decline after being discharged home.  He was supposed to have home  health set up but did not have insurance and the home health agency that was working with him would not accept cash from the family.  The family reported decreased oral intake and patient wincing when swallowing solids although the patient denied pain with swallowing.  He has chronic residual right-sided weakness and cognitive deficits since his stroke.  Completed on date of admission revealed multiple infratentorial and supratentorial remote lacunar infarcts without any acute abnormality  Subjective: Awake, flat affect.  No complaints when asked.  Denies shortness of breath, cough, chest pain, difficulty breathing with walking, abdominal pain or nausea.  Objective: Vitals:   03/10/20 2200 03/11/20 0410  BP: (!) 154/78 (!) 142/82  Pulse: 65 (!) 55  Resp:  17  Temp: 98.1 F (36.7 C) 98 F (36.7 C)  SpO2:  100%    Intake/Output Summary (Last 24 hours) at 03/11/2020 0811 Last data filed at 03/11/2020 0500 Gross per 24 hour  Intake 1200 ml  Output --  Net 1200 ml   Filed Weights   03/07/20 0500 03/09/20 0535 03/10/20 0500  Weight: 70.3 kg 69.1 kg 69.1 kg    Exam:  Constitutional: Awake, no acute distress, calm Respiratory: Anterior lung sounds clear to auscultation, respiratory effort stable on room air Cardiovascular: Heart sounds, no JVD or peripheral edema, pulses regular Abdomen: Soft nontender nondistended.  Eating well.  LBM 2/22 Neurologic: CN 2-12 grossly intact. Sensation intact, DTR normal. Strength 5/5 x on left and slight weakness 4/5 on right. Psychiatric: Alert and oriented times name, not to year  and not to place.   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  MRI from December demonstrated moderate chronic small vessel ischemic  disease with chronic lacunar infarcts. Acute encephalopathy has resolved. Is to have significant issues with short-term memory deficits leading to safety concerns.  He is unable to live alone.  In the hospital he has demonstrated wandering behaviors and therefore would best be suited to a locked ALF behavioral/dementia unit. 2/26: SLP cognitive evaluation as follows:  Pt was seen for a cognitive-linguistic evaluation.  Most recent evaluation was on 01/08/2020 with reported cognitive deficits "including reduced attention, reduced problem solving, reduced safety awareness, decreased executive function skills and disorientation."  Per chart review, pt has a history of short-term memory deficits and will wander (decreased safety awareness).  Pt reported that he lives with his parents and that they assist him with medications and finances.  Pt completed the [facility] SLUMS evaluation, assessing for Mild Neurocognitive Disorder and Dementia on this date.  Pt scored overall 5/30, which falls within the Dementia range (1-20).  Patient exhibited global cognitive deficits in orientation, short-term memory, attention, problem solving, safety judgement, and executive functioning.  Please see below for additional information.  Pt will benefit from 24/7 supervision and full assistance with IADLs (medications, finances, etc.) at time of discharge.  All further skilled ST needs can be met at the next venue of care.  Please re-consult if additional needs arise.   [place] Examination Orientation 1/3  Numeric Problem Solving  0/3  Memory  0/5  Attention 0/2  Thought Organization 1/3  Clock Drawing 0/4  Short Story Recall  2/8  Visuospatial Skills  1/2  Total  5/30 (Dementia Range)    Scoring  High School Education  Less than High School Education   Normal  27-30 25-30  Mild Neurocognitive Disorder 21-26 20-24  Dementia  1-20 1-19    2/25: OT cognitive evaluation Arousal/Alertness: Awake/alert Behavior During  Therapy: Flat affect Overall Cognitive Status: Impaired/Different from baseline Area of Impairment: Awareness;Following commands;Attention;Problem solving;Memory;Safety/judgement Memory: Decreased short-term memory Following Commands: Follows one step commands with increased time;Follows one step commands consistently Safety/Judgement: Decreased awareness of safety;Decreased awareness of deficits Problem Solving: Slow processing;Requires verbal cues  Diabetes mellitus type 2 without hyperglycemia not on long-term insulin: With an A1c of 5.8 Diet controlled  Essential hypertension: Continue amlodipine, hydrochlorothiazide lisinopril  Chronic systolic heart failure: EF of 20% not on beta-blocker due to bradycardia. Continue lisinopril and hydrochlorothiazide  Follow intake and output  History of CVA Continue appropriate blood pressure control Continue Eliquis and Lipitor No chronic focal neurological deficits or gait disturbances noting he ambulates 300 feet with PT  Nutrition Status: Nutrition Problem: Increased nutrient needs Etiology: chronic illness (CHF) Signs/Symptoms: estimated needs Interventions: Magic cup,MVI Estimated body mass index is 20.66 kg/m as calculated from the following:   Height as of this encounter: 6' (1.829 m).   Weight as of this encounter: 69.1 kg.   Other problems: Acute kidney injury on chronic kidney C stage IIIa: Acute hypovolemia resolved.  Hypovolemic hypernatremia: Resolved with IV fluid hydration.    Data Reviewed: Basic Metabolic Panel: Recent Labs  Lab 03/05/20 0208 03/07/20 0118  NA 138 141  K 3.5 3.7  CL 100 101  CO2 29 30  GLUCOSE 99 102*  BUN 28*  30*  CREATININE 1.27* 1.31*  CALCIUM 8.9 9.1   Liver Function Tests: No results for input(s): AST, ALT, ALKPHOS, BILITOT, PROT, ALBUMIN in the last 168 hours. No results for input(s): LIPASE, AMYLASE in the last 168 hours. No results for input(s): AMMONIA in the last  168 hours. CBC: Recent Labs  Lab 03/05/20 0208  WBC 3.8*  HGB 10.2*  HCT 32.5*  MCV 90.8  PLT 121*   Cardiac Enzymes: No results for input(s): CKTOTAL, CKMB, CKMBINDEX, TROPONINI in the last 168 hours. BNP (last 3 results) Recent Labs    11/29/19 0037 01/04/20 1339 01/07/20 1549  BNP 1,278.7* 465.9* 46.1    ProBNP (last 3 results) No results for input(s): PROBNP in the last 8760 hours.  CBG: Recent Labs  Lab 03/05/20 0753 03/07/20 0502 03/08/20 0541 03/10/20 0453 03/11/20 0506  GLUCAP 125* 94 93 95 102*    No results found for this or any previous visit (from the past 240 hour(s)).   Studies: No results found.  Scheduled Meds: . amLODipine  10 mg Oral Daily  . apixaban  5 mg Oral BID  . atorvastatin  80 mg Oral Daily  . hydrALAZINE  10 mg Oral Q8H  . hydrochlorothiazide  25 mg Oral Daily  . lisinopril  40 mg Oral Daily  . magnesium oxide  400 mg Oral BID  . multivitamin with minerals  1 tablet Oral Daily  . 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

## 2020-03-12 NOTE — Progress Notes (Signed)
[facility] PROGRESS NOTE  Liahm Grivas [facility] UEA:[id number] DOB: Sep 01, 1965 DOA: 01/23/2020 PCP: Medicine, [facility]  Status: Remains inpatient appropriate because:Altered mental status and Unsafe d/c plan  Dispo:  Patient From:  HOME  Planned Disposition: To be determined/SNF vs ALF w/ locked dementia unit  Medically stable for discharge:  YES             Barriers to discharge: Patient has underlying dementia and is not safe to discharge home independently.  He does not meet requirements for SNF due to stable mobility but would benefit from ALF that has a locked dementia unit.  Given his mental status issues he would be at risk for medication nonadherence in context of his severe heart failure and hypertension issues which all contributed to his stroke.  He also has diabetes therefore in addition to needing to maintain an appropriate cardiac and fluid restricted diet he also needs to be on a carbohydrate restricted diet to keep diabetes under control to further minimize worsening of cardiovascular disease.             Difficult to place: Yes     Level of care: Telemetry Medical  Code Status: Full Family Communication: HIPAA appropriate voicemail left for patient's mother [name]-will try to recontact mother on 3/2 DVT prophylaxis: Eliquis Vaccination status: Unknown  Foley catheter: Condom cath  HPI: 55 y.o. male past medical history of systolic heart failure with an EF of 35% stage IIIa, CVA diabetes mellitus presents secondary to progressive weakness and decline in the setting of recent stroke December 2021.  Patient was recently discharged from the hospital on 12/28 after admission for acute CVA.  He was discharged to home with home health PT/OT/RN and home health aide under the care of his elderly parents.  Patient has a history of noncompliance.  Patient to the hospital family reported generalized decline after being discharged home.  He was supposed to have home  health set up but did not have insurance and the home health agency that was working with him would not accept cash from the family.  The family reported decreased oral intake and patient wincing when swallowing solids although the patient denied pain with swallowing.  He has chronic residual right-sided weakness and cognitive deficits since his stroke.  Completed on date of admission revealed multiple infratentorial and supratentorial remote lacunar infarcts without any acute abnormality  Subjective: Ache and pleasant.  No complaints.  Remains confused.  Objective: Vitals:   03/11/20 2106 03/12/20 0529  BP: 140/83 140/81  Pulse: 68 64  Resp: 17 18  Temp: 98 F (36.7 C) 98.5 F (36.9 C)  SpO2: 100% 100%    Intake/Output Summary (Last 24 hours) at 03/12/2020 4782 Last data filed at 03/11/2020 1716 Gross per 24 hour  Intake 1380 ml  Output --  Net 1380 ml   Filed Weights   03/07/20 0500 03/09/20 0535 03/10/20 0500  Weight: 70.3 kg 69.1 kg 69.1 kg    Exam:  Constitutional: Awake, calm, no acute distress Respiratory: Lungs are clear and he is stable on room air Cardiovascular: Heart sounds are normal, no peripheral edema, no JVD Abdomen: Abdomen soft and nontender with normoactive bowel sounds.  Continues to eat essentially 100% of all meals.  LBM 2/22 Neurologic: CN 2-12 grossly intact. Sensation intact, DTR normal. Strength 5/5 x on left and slight weakness 4/5 on right. Psychiatric: Alert.  Oriented to name only.  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  MRI from December demonstrated moderate chronic small vessel ischemic disease with chronic lacunar infarcts. Acute encephalopathy has resolved. Is to have  significant issues with short-term memory deficits leading to safety concerns.  He is unable to live alone.  In the hospital he has demonstrated wandering behaviors and therefore would best be suited to a locked ALF behavioral/dementia unit. 2/26: SLP cognitive evaluation as follows:  Pt was seen for a cognitive-linguistic evaluation.  Most recent evaluation was on 01/08/2020 with reported cognitive deficits "including reduced attention, reduced problem solving, reduced safety awareness, decreased executive function skills and disorientation."  Per chart review, pt has a history of short-term memory deficits and will wander (decreased safety awareness).  Pt reported that he lives with his parents and that they assist him with medications and finances.  Pt completed the [facility] SLUMS evaluation, assessing for Mild Neurocognitive Disorder and Dementia on this date.  Pt scored overall 5/30, which falls within the Dementia range (1-20).  Patient exhibited global cognitive deficits in orientation, short-term memory, attention, problem solving, safety judgement, and executive functioning.  Please see below for additional information.  Pt will benefit from 24/7 supervision and full assistance with IADLs (medications, finances, etc.) at time of discharge.  All further skilled ST needs can be met at the next venue of care.  Please re-consult if additional needs arise.   [place] Examination Orientation 1/3  Numeric Problem Solving  0/3  Memory  0/5  Attention 0/2  Thought Organization 1/3  Clock Drawing 0/4  Short Story Recall  2/8  Visuospatial Skills  1/2  Total  5/30 (Dementia Range)    Scoring  High School Education  Less than High School Education   Normal  27-30 25-30  Mild Neurocognitive Disorder 21-26 20-24  Dementia  1-20 1-19    2/25: OT cognitive evaluation Arousal/Alertness: Awake/alert Behavior During Therapy: Flat affect Overall Cognitive Status: Impaired/Different from baseline Area of  Impairment: Awareness;Following commands;Attention;Problem solving;Memory;Safety/judgement Memory: Decreased short-term memory Following Commands: Follows one step commands with increased time;Follows one step commands consistently Safety/Judgement: Decreased awareness of safety;Decreased awareness of deficits Problem Solving: Slow processing;Requires verbal cues  Diabetes mellitus type 2 without hyperglycemia not on long-term insulin: With an A1c of 5.8 Diet controlled  Essential hypertension: Continue amlodipine, hydrochlorothiazide lisinopril  Chronic systolic heart failure: EF of 20% not on beta-blocker due to bradycardia. Continue lisinopril and hydrochlorothiazide  Follow intake and output  History of CVA Continue appropriate blood pressure control Continue Eliquis and Lipitor No chronic focal neurological deficits or gait disturbances noting he ambulates 300 feet with PT  Nutrition Status: Nutrition Problem: Increased nutrient needs Etiology: chronic illness (CHF) Signs/Symptoms: estimated needs Interventions: Magic cup,MVI Estimated body mass index is 20.66 kg/m as calculated from the following:   Height as of this encounter: 6' (1.829 m).   Weight as of this encounter: 69.1 kg.   Other problems: Acute kidney injury on chronic kidney C stage IIIa: Acute hypovolemia resolved.  Hypovolemic hypernatremia: Resolved with IV fluid hydration.    Data Reviewed: Basic Metabolic Panel: Recent Labs  Lab 03/07/20 0118  NA 141  K 3.7  CL 101  CO2 30  GLUCOSE 102*  BUN 30*  CREATININE 1.31*  CALCIUM 9.1   Liver Function Tests: No results for input(s):  AST, ALT, ALKPHOS, BILITOT, PROT, ALBUMIN in the last 168 hours. No results for input(s): LIPASE, AMYLASE in the last 168 hours. No results for input(s): AMMONIA in the last 168 hours. CBC: No results for input(s): WBC, NEUTROABS, HGB, HCT, MCV, PLT in the last 168 hours. Cardiac Enzymes: No results for  input(s): CKTOTAL, CKMB, CKMBINDEX, TROPONINI in the last 168 hours. BNP (last 3 results) Recent Labs    11/29/19 0037 01/04/20 1339 01/07/20 1549  BNP 1,278.7* 465.9* 46.1    ProBNP (last 3 results) No results for input(s): PROBNP in the last 8760 hours.  CBG: Recent Labs  Lab 03/07/20 0502 03/08/20 0541 03/10/20 0453 03/11/20 0506  GLUCAP 94 93 95 102*    No results found for this or any previous visit (from the past 240 hour(s)).   Studies: No results found.  Scheduled Meds: . amLODipine  10 mg Oral Daily  . apixaban  5 mg Oral BID  . atorvastatin  80 mg Oral Daily  . hydrALAZINE  10 mg Oral Q8H  . hydrochlorothiazide  25 mg Oral Daily  . lisinopril  40 mg Oral Daily  . magnesium oxide  400 mg Oral BID  . multivitamin with minerals  1 tablet Oral Daily  . 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

## 2020-03-12 NOTE — TOC Progression Note (Addendum)
Transition of Care [facility]) - Progression Note    Patient Details  Name: Kevin [name] MRN: [id number] Date of Birth: [birth year]  Transition [facility]) CM/SW Contact  Janae Bridgeman, RN Phone Number: 03/12/2020, 9:55 AM  Clinical Narrative:    Case management spoke with admissions liaison at [facility] facility at [phone] and they are unable to offer placement to the patient or accept clinicals since the facility is not a locked unit and patient will need a locked facility for placement - most likely SNF facility with memory care / locked unit.  The patient has history of CVA, history of wandering, Left sided blindness and able to ambulate >650 feet. Patient's mother is unable to care for him at home and patient is waiting on Medicaid approval at this time.  Financial counseling is following for Medicaid application and approval.  CM and MSW will continue to follow the patient for SNF placement with locked unit capability.  I spoke with [place], CM at [facility] in [place], [place] and the facility does have a Wanderguard system and are willing to view the patient's clinicals.  Clinicals were sent securely to [email or web address].    I also spoke with Phineas Semen, CM at [facility] and she was willing to view the patients clinicals as well for possible admission to the facility.  Clinicals were sent securely to [email or web address].  The patient is fully vaccinated for COVID per notes.  CM and MSW will continue to follow the patient for SNF admission to an appropriate unit for wandering history.   Expected Discharge Plan: Long Term Nursing Home Barriers to Discharge: SNF Pending bed offer,SNF Pending payor source - LOG,SNF Pending Medicaid  Expected Discharge Plan and Services Expected Discharge Plan: Long Term Nursing Home In-house Referral: Clinical Social Work Discharge Planning Services: CM  Consult Post Acute Care Choice: Nursing Home (LTC Nursing Home) Living arrangements for the past 2 months: Single Family Home                           HH Arranged: OT,PT HH Agency: [facility] Date [facility] Agency Contacted: 01/28/20 Time HH Agency Contacted: 1425 Representative spoke with at [facility] Agency: Kandee Keen   Social Determinants of Health (SDOH) Interventions    Readmission Risk Interventions Readmission Risk Prevention Plan 01/08/2020  Transportation Screening Complete  PCP or Specialist Appt within 5-7 Days Complete  Home Care Screening Complete  Medication Review (RN CM) Complete  Some recent data might be hidden

## 2020-03-12 NOTE — Progress Notes (Signed)
Occupational Therapy Treatment/Discharge Patient Details Name: Kevin [name] MRN: [id number] DOB: [birth year] Today's Date: 03/12/2020    History of present illness Pt is a 55 y.o. male admitted 01/23/20 with weakness and generalized decline at home; workup for metabolic encephalopathy, hyponatremia, AKI on CKD. 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   OT comments  Pt is at Ind/Mod I - Sup level with ADLs/selfcare and ADL mobility. All goals met and no further acute OT services are indicated at this time and pt d/c from OT services. Pt appropriate for d/c to LTC/ALF setting from acute care due to cognitive impairments. OT will sign off  Follow Up Recommendations  Supervision/Assistance - 24 hour (LTC/ALF)    Equipment Recommendations  None recommended by OT    Recommendations for Other Services      Precautions / Restrictions Precautions Precautions: Fall Precaution Comments: L eye blindness Restrictions Weight Bearing Restrictions: No       Mobility Bed Mobility Overal bed mobility: Independent Bed Mobility: Sit to Supine;Supine to Sit     Supine to sit: Independent Sit to supine: Independent        Transfers Overall transfer level: Independent Equipment used: None Transfers: Sit to/from Stand Sit to Stand: Independent              Balance Overall balance assessment: Needs assistance Sitting-balance support: No upper extremity supported;Feet supported Sitting balance-Leahy Scale: Good     Standing balance support: No upper extremity supported;During functional activity Standing balance-Leahy Scale: Fair                             ADL either performed or assessed with clinical judgement   ADL Overall ADL's : Needs assistance/impaired     Grooming: Wash/dry hands;Wash/dry face;Oral care;Standing;Modified independent   Upper Body Bathing: Modified independent   Lower Body  Bathing: Supervison/ safety;Sit to/from stand Lower Body Bathing Details (indicate cue type and reason): Sup for safety Upper Body Dressing : Modified independent;Standing   Lower Body Dressing: Supervision/safety;Sit to/from stand Lower Body Dressing Details (indicate cue type and reason): sup for safety Toilet Transfer: Modified Independent   Toileting- Clothing Manipulation and Hygiene: Modified independent   Tub/ Shower Transfer: Supervision/safety;Modified independent   Functional mobility during ADLs: Modified independent;Independent       Vision Baseline Vision/History: Retinopathy Patient Visual Report: No change from baseline     Perception     Praxis      Cognition Arousal/Alertness: Awake/alert Behavior During Therapy: Impulsive Overall Cognitive Status: Impaired/Different from baseline Area of Impairment: Problem solving                             Problem Solving: Requires verbal cues;Requires tactile cues          Exercises     Shoulder Instructions       General Comments      Pertinent Vitals/ Pain       Pain Assessment: No/denies pain Pain Score: 0-No pain  Home Living                                          Prior Functioning/Environment              Frequency  Progress Toward Goals  OT Goals(current goals can now be found in the care plan section)  Progress towards OT goals: Progressing toward goals  Acute Rehab OT Goals Patient Stated Goal: none stated  Plan Discharge plan remains appropriate;All goals met and education completed, patient discharged from OT services    Co-evaluation                 AM-PAC OT "6 Clicks" Daily Activity     Outcome Measure   Help from another person eating meals?: None Help from another person taking care of personal grooming?: None Help from another person toileting, which includes using toliet, bedpan, or urinal?: None Help from another  person bathing (including washing, rinsing, drying)?: None Help from another person to put on and taking off regular upper body clothing?: None Help from another person to put on and taking off regular lower body clothing?: None 6 Click Score: 24    End of Session    OT Visit Diagnosis: Unsteadiness on feet (R26.81);Cognitive communication deficit (R41.841);Low vision, both eyes (H54.2);Other symptoms and signs involving cognitive function;Muscle weakness (generalized) (M62.81);Other abnormalities of gait and mobility (R26.89) Symptoms and signs involving cognitive functions: Cerebral infarction   Activity Tolerance Patient tolerated treatment well   Patient Left with call bell/phone within reach;in bed;with bed alarm set   Nurse Communication          Time: 4417-1278 OT Time Calculation (min): 25 min  Charges: OT General Charges $OT Visit: 1 Visit OT Treatments $Self Care/Home Management : 8-22 mins $Therapeutic Activity: 8-22 mins     Britt Bottom 03/12/2020, 2:59 PM

## 2020-03-13 DIAGNOSIS — H00011 Hordeolum externum right upper eyelid: Secondary | ICD-10-CM

## 2020-03-13 DIAGNOSIS — H00012 Hordeolum externum right lower eyelid: Secondary | ICD-10-CM

## 2020-03-13 DIAGNOSIS — H00015 Hordeolum externum left lower eyelid: Secondary | ICD-10-CM

## 2020-03-13 DIAGNOSIS — F0281 Dementia in other diseases classified elsewhere with behavioral disturbance: Secondary | ICD-10-CM

## 2020-03-13 LAB — GLUCOSE, CAPILLARY: Glucose-Capillary: 171 mg/dL — ABNORMAL HIGH (ref 70–99)

## 2020-03-13 MED ORDER — TOBRAMYCIN 0.3 % OP SOLN
2.0000 [drp] | Freq: Four times a day (QID) | OPHTHALMIC | Status: AC
  Administered 2020-03-13 – 2020-03-23 (×39): 2 [drp] via OPHTHALMIC
  Filled 2020-03-13: qty 5

## 2020-03-13 NOTE — Progress Notes (Signed)
[facility] PROGRESS NOTE  Kevin [name] [facility] YTK:[id number] DOB: [birth year] DOA: 01/23/2020 PCP: [facility], [facility]  Status: Remains inpatient appropriate because:Altered mental status and Unsafe d/c plan  Dispo:  Patient From:  HOME  Planned Disposition: To be determined/SNF vs ALF w/ locked dementia unit-[facility] reviewing clinicals--clinicals have been sent to [facility] SNF  Medically stable for discharge:  YES             Barriers to discharge: Patient has underlying dementia and is not safe to discharge home independently.  He does not meet requirements for SNF due to stable mobility but would benefit from ALF that has a locked dementia unit.  Given his mental status issues he would be at risk for medication nonadherence in context of his severe heart failure and hypertension issues which all contributed to his stroke.  He also has diabetes therefore in addition to needing to maintain an appropriate cardiac and fluid restricted diet he also needs to be on a carbohydrate restricted diet to keep diabetes under control to further minimize worsening of cardiovascular disease.             Difficult to place: Yes     Level of care: Telemetry Medical  Code Status: Full Family Communication: HIPAA appropriate voicemail left for patient's mother Kevin [name]-will try to recontact mother on 3/2 DVT prophylaxis: Eliquis Vaccination status: Unknown  Foley catheter: Condom cath  HPI: 55 y.o. male past medical history of systolic heart failure with an EF of 35% stage IIIa, CVA diabetes mellitus presents secondary to progressive weakness and decline in the setting of recent stroke December 2021.  Patient was recently discharged from the hospital on 12/28 after admission for acute CVA.  He was discharged to home with home health PT/OT/RN and home health aide under the care of his elderly parents.  Patient has a history of noncompliance.  Patient to the hospital family reported generalized  decline after being discharged home.  He was supposed to have home health set up but did not have insurance and the home health agency that was working with him would not accept cash from the family.  The family reported decreased oral intake and patient wincing when swallowing solids although the patient denied pain with swallowing.  He has chronic residual right-sided weakness and cognitive deficits since his stroke.  Completed on date of admission revealed multiple infratentorial and supratentorial remote lacunar infarcts without any acute abnormality  Subjective: Awake, bland affect but did improve somewhat with interaction and telling of jokes.  Objective: Vitals:   03/12/20 2018 03/13/20 0449  BP: (!) 143/91 (!) 165/83  Pulse: 61 61  Resp: 17 19  Temp: 98.6 F (37 C) 97.9 F (36.6 C)  SpO2: 100% 100%    Intake/Output Summary (Last 24 hours) at 03/13/2020 0841 Last data filed at 03/13/2020 7517 Gross per 24 hour  Intake 480 ml  Output --  Net 480 ml   Filed Weights   03/09/20 0535 03/10/20 0500 03/13/20 0500  Weight: 69.1 kg 69.1 kg 70.2 kg    Exam:  Constitutional: Awake, alert and calm ENT: Patient has bilateral hordeolum: 2 areas on the upper lid and one on the lower lid of the right and one area on the lower lid of the left.  On the right there is some purulent drainage and he also has bilateral injection of both eyes. Respiratory: Lungs are clear and he is stable on room air Cardiovascular: Normal heart sounds, pulse regular, no JVD  or peripheral edema Abdomen: Soft nontender with normoactive bowel sounds and excellent appetite and intake of food.  LBM 2/22 Neurologic: CN 2-12 grossly intact. Sensation intact, DTR normal. Strength 5/5 x on left and slight weakness 4/5 on right. Psychiatric: Alert.  Times name.  No insight as to reasoning for hospitalization.  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  MRI from December demonstrated moderate chronic small vessel ischemic disease with chronic lacunar infarcts. Acute encephalopathy has resolved. Is to have significant issues with short-term memory deficits leading to safety concerns.  He is unable to live alone.  In the hospital he has demonstrated wandering behaviors and therefore would best be suited to a locked ALF behavioral/dementia unit. 2/26: SLP cognitive evaluation as follows:  Pt was seen for a cognitive-linguistic evaluation.  Most recent evaluation was on 01/08/2020 with reported cognitive deficits "including reduced attention, reduced problem solving, reduced safety awareness, decreased executive function skills and disorientation."  Per chart review, pt has a history of short-term memory deficits and will wander (decreased safety awareness).  Pt reported that he lives with his parents and that they assist him with medications and finances.  Pt completed the [facility] SLUMS evaluation, assessing for Mild Neurocognitive Disorder and Dementia on this date.  Pt scored overall 5/30, which falls within the Dementia range (1-20).  Patient exhibited global cognitive deficits in orientation, short-term memory, attention, problem solving, safety judgement, and executive functioning.  Please see below for additional information.  Pt will benefit from 24/7 supervision and full assistance with IADLs (medications, finances, etc.) at time of discharge.  All further skilled ST needs can be met at the next venue of care.  Please re-consult if additional needs arise.   [facility] SLUMS Examination Orientation 1/3  Numeric Problem Solving  0/3  Memory  0/5  Attention 0/2   Thought Organization 1/3  Clock Drawing 0/4  Short Story Recall  2/8  Visuospatial Skills  1/2  Total  5/30 (Dementia Range)    Scoring  High School Education  Less than High School Education   Normal  27-30 25-30  Mild Neurocognitive Disorder 21-26 20-24  Dementia  1-20 1-19    2/25: OT cognitive evaluation Arousal/Alertness: Awake/alert Behavior During Therapy: Flat affect Overall Cognitive Status: Impaired/Different from baseline Area of Impairment: Awareness;Following commands;Attention;Problem solving;Memory;Safety/judgement Memory: Decreased short-term memory Following Commands: Follows one step commands with increased time;Follows one step commands consistently Safety/Judgement: Decreased awareness of safety;Decreased awareness of deficits Problem Solving: Slow processing;Requires verbal cues  Bilateral Hordeolum -Begin tobramycin eyedrops for 10 days  Diabetes mellitus type 2 without hyperglycemia not on long-term insulin: With an A1c of 5.8 Diet controlled  Essential hypertension: Continue amlodipine, hydrochlorothiazide lisinopril  Chronic systolic heart failure: EF of 20% not on beta-blocker due to bradycardia. Continue lisinopril and hydrochlorothiazide  Follow intake and output  History of CVA Continue appropriate blood pressure control Continue Eliquis and Lipitor No chronic focal neurological deficits or gait disturbances noting he ambulates 300 feet with PT  Nutrition Status: Nutrition Problem: Increased nutrient needs Etiology: chronic illness (CHF) Signs/Symptoms: estimated needs Interventions: Magic cup,MVI Estimated body mass index is 20.99 kg/m as calculated from the following:     Height as of this encounter: 6' (1.829 m).   Weight as of this encounter: 70.2 kg.   Other problems: Acute kidney injury on chronic kidney C stage IIIa: Acute hypovolemia resolved.  Hypovolemic hypernatremia: Resolved with IV fluid hydration.    Data  Reviewed: Basic Metabolic Panel: Recent Labs  Lab 03/07/20 0118  NA 141  K 3.7  CL 101  CO2 30  GLUCOSE 102*  BUN 30*  CREATININE 1.31*  CALCIUM 9.1   Liver Function Tests: No results for input(s): AST, ALT, ALKPHOS, BILITOT, PROT, ALBUMIN in the last 168 hours. No results for input(s): LIPASE, AMYLASE in the last 168 hours. No results for input(s): AMMONIA in the last 168 hours. CBC: No results for input(s): WBC, NEUTROABS, HGB, HCT, MCV, PLT in the last 168 hours. Cardiac Enzymes: No results for input(s): CKTOTAL, CKMB, CKMBINDEX, TROPONINI in the last 168 hours. BNP (last 3 results) Recent Labs    11/29/19 0037 01/04/20 1339 01/07/20 1549  BNP 1,278.7* 465.9* 46.1    ProBNP (last 3 results) No results for input(s): PROBNP in the last 8760 hours.  CBG: Recent Labs  Lab 03/07/20 0502 03/08/20 0541 03/10/20 0453 03/11/20 0506 03/13/20 0529  GLUCAP 94 93 95 102* 171*    No results found for this or any previous visit (from the past 240 hour(s)).   Studies: No results found.  Scheduled Meds: . amLODipine  10 mg Oral Daily  . apixaban  5 mg Oral BID  . atorvastatin  80 mg Oral Daily  . hydrALAZINE  10 mg Oral Q8H  . hydrochlorothiazide  25 mg Oral Daily  . lisinopril  40 mg Oral Daily  . magnesium oxide  400 mg Oral BID  . multivitamin with minerals  1 tablet Oral Daily  . 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

## 2020-03-14 LAB — GLUCOSE, CAPILLARY: Glucose-Capillary: 99 mg/dL (ref 70–99)

## 2020-03-14 NOTE — Progress Notes (Signed)
[facility] PROGRESS NOTE  Kevin [name] [facility] GGE:[id number] DOB: January 01, 1966 DOA: 01/23/2020 PCP: Medicine, [facility]  Status: Remains inpatient appropriate because:Altered mental status and Unsafe d/c plan  Dispo:  Patient From:  HOME  Planned Disposition: To be determined/SNF vs ALF w/ locked dementia unit-[facility] reviewing clinicals--clinicals have been sent to [facility]F  Medically stable for discharge:  YES             Barriers to discharge: Patient has underlying dementia and is not safe to discharge home independently.  He does not meet requirements for SNF due to stable mobility but would benefit from ALF that has a locked dementia unit.  Given his mental status issues he would be at risk for medication nonadherence in context of his severe heart failure and hypertension issues which all contributed to his stroke.  He also has diabetes therefore in addition to needing to maintain an appropriate cardiac and fluid restricted diet he also needs to be on a carbohydrate restricted diet to keep diabetes under control to further minimize worsening of cardiovascular disease.             Difficult to place: Yes     Level of care: Telemetry Medical  Code Status: Full Family Communication: HIPAA appropriate voicemail left for patient's mother Kevin [name]-will try to recontact mother on 3/2 DVT prophylaxis: Eliquis Vaccination status: Unknown  Foley catheter: Condom cath  HPI: 54 y.o. male past medical history of systolic heart failure with an EF of 35% stage IIIa, CVA diabetes mellitus presents secondary to progressive weakness and decline in the setting of recent stroke December 2021.  Patient was recently discharged from the hospital on 12/28 after admission for acute CVA.  He was discharged to home with home health PT/OT/RN and home health aide under the care of his elderly parents.  Patient has a history of noncompliance.  Patient to the hospital family reported generalized  decline after being discharged home.  He was supposed to have home health set up but did not have insurance and the home health agency that was working with him would not accept cash from the family.  The family reported decreased oral intake and patient wincing when swallowing solids although the patient denied pain with swallowing.  He has chronic residual right-sided weakness and cognitive deficits since his stroke.  Completed on date of admission revealed multiple infratentorial and supratentorial remote lacunar infarcts without any acute abnormality  Subjective: Awake.  When asked about his eyes states they feel better now that we started eyedrops.  Objective: Vitals:   03/13/20 2022 03/14/20 0353  BP: 131/78 (!) 155/89  Pulse: 63 (!) 52  Resp: 17 16  Temp: 98.5 F (36.9 C) 97.8 F (36.6 C)  SpO2: 100% 100%    Intake/Output Summary (Last 24 hours) at 03/14/2020 0819 Last data filed at 03/14/2020 0730 Gross per 24 hour  Intake 860 ml  Output --  Net 860 ml   Filed Weights   03/09/20 0535 03/10/20 0500 03/13/20 0500  Weight: 69.1 kg 69.1 kg 70.2 kg    Exam:  Constitutional: Awake, Bland affect, no acute distress ENT: Patient has bilateral hordeolum: 2 areas on the upper lid and one on the lower lid of the right and one area on the lower lid of the left.  Previous erythema and scleral injection have resolved. Respiratory: Lungs are clear and he is stable on room air Cardiovascular: Normal heart sounds, no peripheral edema no JVD, pulse regular Abdomen: Soft,  bowel sounds present.  Eating well.  LBM 2/22 Neurologic: CN 2-12 grossly intact. Sensation intact, DTR normal. Strength 5/5 x on left and slight weakness 4/5 on right. Psychiatric: Awake.  Oriented times name only.  [facility] stated [facility] year was [facility], was unable to provide an answer as to why he had to come into the hospital only stating that "I was not doing well".  Patient  reoriented.   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  MRI from December demonstrated moderate chronic small vessel ischemic disease with chronic lacunar infarcts. Acute encephalopathy has resolved. Is to have significant issues with short-term memory deficits leading to safety concerns.  He is unable to live alone.  In the hospital he has demonstrated wandering behaviors and therefore would best be suited to a locked ALF behavioral/dementia unit. 2/26: SLP cognitive evaluation as follows:  Pt was seen for a cognitive-linguistic evaluation.  Most recent evaluation was on 01/08/2020 with reported cognitive deficits "including reduced attention, reduced problem solving, reduced safety awareness, decreased executive function skills and disorientation."  Per chart review, pt has a history of short-term memory deficits and will wander (decreased safety awareness).  Pt reported that he lives with his parents and that they assist him with medications and finances.  Pt completed the [facility] SLUMS evaluation, assessing for Mild Neurocognitive Disorder and Dementia on this date.  Pt scored overall 5/30, which falls within the Dementia range (1-20).  Patient exhibited global cognitive deficits in orientation, short-term memory, attention, problem solving, safety judgement, and executive functioning.  Please see below for additional information.  Pt will benefit from 24/7 supervision and full assistance with IADLs (medications, finances, etc.) at time of discharge.  All further skilled ST needs can be met at the next venue of care.  Please re-consult if additional needs arise.   [place] Examination Orientation 1/3  Numeric Problem Solving  0/3  Memory  0/5  Attention  0/2  Thought Organization 1/3  Clock Drawing 0/4  Short Story Recall  2/8  Visuospatial Skills  1/2  Total  5/30 (Dementia Range)    Scoring  High School Education  Less than High School Education   Normal  27-30 25-30  Mild Neurocognitive Disorder 21-26 20-24  Dementia  1-20 1-19    2/25: OT cognitive evaluation Arousal/Alertness: Awake/alert Behavior During Therapy: Flat affect Overall Cognitive Status: Impaired/Different from baseline Area of Impairment: Awareness;Following commands;Attention;Problem solving;Memory;Safety/judgement Memory: Decreased short-term memory Following Commands: Follows one step commands with increased time;Follows one step commands consistently Safety/Judgement: Decreased awareness of safety;Decreased awareness of deficits Problem Solving: Slow processing;Requires verbal cues  Bilateral Hordeolum -Continue tobramycin eyedrops for 10 days (initiated on 3/3) note significant improvement in symptoms -3/4 add warm compresses  Diabetes mellitus type 2 without hyperglycemia not on long-term insulin: With an A1c of 5.8 Diet controlled  Essential hypertension: Continue amlodipine, hydrochlorothiazide lisinopril  Chronic systolic heart failure: EF of 20% not on beta-blocker due to bradycardia. Continue lisinopril and hydrochlorothiazide  Follow intake and output  History of CVA Continue appropriate blood pressure control Continue Eliquis and Lipitor No chronic focal neurological deficits or gait disturbances noting he ambulates 300 feet with PT  Nutrition Status: Nutrition Problem: Increased nutrient needs Etiology: chronic illness (CHF) Signs/Symptoms: estimated needs Interventions: Magic cup,MVI Estimated body  mass index is 20.99 kg/m as calculated from the following:   Height as of this encounter: 6' (1.829 m).   Weight as of this encounter: 70.2 kg.   Other problems: Acute kidney injury on chronic kidney C stage IIIa: Acute  hypovolemia resolved.  Hypovolemic hypernatremia: Resolved with IV fluid hydration.    Data Reviewed: Basic Metabolic Panel: No results for input(s): NA, K, CL, CO2, GLUCOSE, BUN, CREATININE, CALCIUM, MG, PHOS in the last 168 hours. Liver Function Tests: No results for input(s): AST, ALT, ALKPHOS, BILITOT, PROT, ALBUMIN in the last 168 hours. No results for input(s): LIPASE, AMYLASE in the last 168 hours. No results for input(s): AMMONIA in the last 168 hours. CBC: No results for input(s): WBC, NEUTROABS, HGB, HCT, MCV, PLT in the last 168 hours. Cardiac Enzymes: No results for input(s): CKTOTAL, CKMB, CKMBINDEX, TROPONINI in the last 168 hours. BNP (last 3 results) Recent Labs    11/29/19 0037 01/04/20 1339 01/07/20 1549  BNP 1,278.7* 465.9* 46.1    ProBNP (last 3 results) No results for input(s): PROBNP in the last 8760 hours.  CBG: Recent Labs  Lab 03/08/20 0541 03/10/20 0453 03/11/20 0506 03/13/20 0529 03/14/20 0544  GLUCAP 93 95 102* 171* 99    No results found for this or any previous visit (from the past 240 hour(s)).   Studies: No results found.  Scheduled Meds: . amLODipine  10 mg Oral Daily  . apixaban  5 mg Oral BID  . atorvastatin  80 mg Oral Daily  . hydrALAZINE  10 mg Oral Q8H  . hydrochlorothiazide  25 mg Oral Daily  . lisinopril  40 mg Oral Daily  . magnesium oxide  400 mg Oral BID  . multivitamin with minerals  1 tablet Oral Daily  . 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 [facility]) Active Problems:   Diabetes mellitus without complication ([place])   History of CVA (cerebrovascular accident)   Essential hypertension   CKD (chronic kidney disease), stage III (HCC)   Hypernatremia   Chronic CHF (congestive heart failure) (HCC)   Acute hypernatremia   Hordeolum externum of right upper eyelid   Hordeolum externum of right lower eyelid   Hordeolum  externum of left lower eyelid   Consultants:  None  Procedures:  None  Antibiotics: Anti-infectives (From admission, onward)   None       Time spent: 20 minutes    Erin Hearing ANP  [facility] 7 am - 330 pm/M-F for direct patient care and secure chat Please refer to Amion for contact info 50  days

## 2020-03-15 LAB — CBC
HCT: 31.5 % — ABNORMAL LOW (ref 39.0–52.0)
Hemoglobin: 10 g/dL — ABNORMAL LOW (ref 13.0–17.0)
MCH: 29.1 pg (ref 26.0–34.0)
MCHC: 31.7 g/dL (ref 30.0–36.0)
MCV: 91.6 fL (ref 80.0–100.0)
Platelets: 94 10*3/uL — ABNORMAL LOW (ref 150–400)
RBC: 3.44 MIL/uL — ABNORMAL LOW (ref 4.22–5.81)
RDW: 13.4 % (ref 11.5–15.5)
WBC: 3.8 10*3/uL — ABNORMAL LOW (ref 4.0–10.5)
nRBC: 0 % (ref 0.0–0.2)

## 2020-03-15 LAB — COMPREHENSIVE METABOLIC PANEL
ALT: 23 U/L (ref 0–44)
AST: 21 U/L (ref 15–41)
Albumin: 3.4 g/dL — ABNORMAL LOW (ref 3.5–5.0)
Alkaline Phosphatase: 53 U/L (ref 38–126)
Anion gap: 8 (ref 5–15)
BUN: 25 mg/dL — ABNORMAL HIGH (ref 6–20)
CO2: 29 mmol/L (ref 22–32)
Calcium: 9.1 mg/dL (ref 8.9–10.3)
Chloride: 100 mmol/L (ref 98–111)
Creatinine, Ser: 1.27 mg/dL — ABNORMAL HIGH (ref 0.61–1.24)
GFR, Estimated: >60 mL/min (ref >60)
Glucose, Bld: 113 mg/dL — ABNORMAL HIGH (ref 70–99)
Potassium: 3.7 mmol/L (ref 3.5–5.1)
Sodium: 137 mmol/L (ref 135–145)
Total Bilirubin: 0.8 mg/dL (ref 0.3–1.2)
Total Protein: 6.2 g/dL — ABNORMAL LOW (ref 6.5–8.1)

## 2020-03-15 LAB — GLUCOSE, CAPILLARY: Glucose-Capillary: 104 mg/dL — ABNORMAL HIGH (ref 70–99)

## 2020-03-15 NOTE — Progress Notes (Signed)
Patient seen and examined, his vital signs are stable, has no acute complaints, he is calm and cooperative, he is not oriented to time, he thinks he is at rehab facility.   He is medically stable, will discontinue telemetry. Details please refer note from Ms. Revonda Standard.

## 2020-03-16 LAB — GLUCOSE, CAPILLARY: Glucose-Capillary: 103 mg/dL — ABNORMAL HIGH (ref 70–99)

## 2020-03-16 NOTE — Progress Notes (Signed)
Alert and oriented with some memory problems. Denies pain or discomfort. Call bell within reach. Bed alarm in use. Impulsive with transfers but easy to re-direct. Medication taken in pudding with no problems. Glucose checked. Continue plan of care.

## 2020-03-16 NOTE — Progress Notes (Addendum)
Difficult placement patient has been medically stable, uneventful night He is alert , interactive , calm and cooperative, he knows he is in the hospital, states it is may 2021. He denies pain.   Details please refer to Ms Revonda Standard 's note on 3/4.

## 2020-03-17 LAB — GLUCOSE, CAPILLARY: Glucose-Capillary: 110 mg/dL — ABNORMAL HIGH (ref 70–99)

## 2020-03-17 NOTE — Care Plan (Signed)
Patient seen and examined at bedside.  Overnight events noted.  His vital signs are stable,  he has no acute complaints.  He is calm, comfortable and cooperative.  He is slightly confused.  Medically stable , difficult placement. Please refer to Ms. Allison's note on 3/4.

## 2020-03-18 LAB — GLUCOSE, CAPILLARY: Glucose-Capillary: 228 mg/dL — ABNORMAL HIGH (ref 70–99)

## 2020-03-18 NOTE — TOC Progression Note (Signed)
Transition of Care [facility]) - Progression Note    Patient Details  Name: Kevin [name] MRN: [id number] Date of Birth: Nov 23, 1965  Transition of Care [facility]) CM/SW Contact  Janae Bridgeman, RN Phone Number: 03/18/2020, 2:12 PM  Clinical Narrative:    Called and left a message with [place], CM with [facility] in [place], [place] and inquired about bed availability at the facility.  The facility has sent the initial referral to the corporate office to evaluate the patient for possible admission to the facility.  CM and MSW will continue to follow the patient for admission.  Expected Discharge Plan: Long Term Nursing Home Barriers to Discharge: SNF Pending bed offer,SNF Pending payor source - LOG,SNF Pending Medicaid  Expected Discharge Plan and Services Expected Discharge Plan: Long Term Nursing Home In-house Referral: Clinical Social Work Discharge Planning Services: CM Consult Post Acute Care Choice: Nursing Home (LTC Nursing Home) Living arrangements for the past 2 months: Single Family Home                           HH Arranged: OT,PT HH Agency: [facility] Date [facility] Agency Contacted: 01/28/20 Time HH Agency Contacted: 1425 Representative spoke with at [facility] Agency: Kandee Keen   Social Determinants of Health (SDOH) Interventions    Readmission Risk Interventions Readmission Risk Prevention Plan 01/08/2020  Transportation Screening Complete  PCP or Specialist Appt within 5-7 Days Complete  Home Care Screening Complete  Medication Review (RN CM) Complete  Some recent data might be hidden

## 2020-03-18 NOTE — Progress Notes (Signed)
[facility] PROGRESS NOTE  Jahmai Finelli [facility] POE:[id number] DOB: February 18, 1965 DOA: 01/23/2020 PCP: Medicine, [facility]  Status: Remains inpatient appropriate because:Altered mental status and Unsafe d/c plan  Dispo:  Patient From:  HOME  Planned Disposition: Skilled Nursing Facility/SNF vs ALF w/ locked dementia unit-[facility] reviewing clinicals--clinicals have been sent to [facility]  Medically stable for discharge: YesYES             Barriers to discharge: Patient has underlying dementia and is not safe to discharge home independently.  He does not meet requirements for SNF due to stable mobility but would benefit from ALF that has a locked dementia unit.  Given his mental status issues he would be at risk for medication nonadherence in context of his severe heart failure and hypertension issues which all contributed to his stroke.  He also has diabetes therefore in addition to needing to maintain an appropriate cardiac and fluid restricted diet he also needs to be on a carbohydrate restricted diet to keep diabetes under control to further minimize worsening of cardiovascular disease.             Difficult to place: Yes     Level of care: Med-Surg  Code Status: Full Family Communication: HIPAA appropriate voicemail left for patient's mother [name] on 3/1 DVT prophylaxis: Eliquis Vaccination status: Unknown  Foley catheter: Condom cath  HPI: 55 y.o. male past medical history of systolic heart failure with an EF of 35% stage IIIa, CVA diabetes mellitus presents secondary to progressive weakness and decline in the setting of recent stroke December 2021.  Patient was recently discharged from the hospital on 12/28 after admission for acute CVA.  He was discharged to home with home health PT/OT/RN and home health aide under the care of his elderly parents.  Patient has a history of noncompliance.  Patient to the hospital family reported generalized decline after being  discharged home.  He was supposed to have home health set up but did not have insurance and the home health agency that was working with him would not accept cash from the family.  The family reported decreased oral intake and patient wincing when swallowing solids although the patient denied pain with swallowing.  He has chronic residual right-sided weakness and cognitive deficits since his stroke.  Completed on date of admission revealed multiple infratentorial and supratentorial remote lacunar infarcts without any acute abnormality  Subjective: Patient sleeping soundly and did not awaken for physical exam.  Objective: Vitals:   03/17/20 1959 03/18/20 0545  BP: (!) 144/74 139/87  Pulse: 68 75  Resp: 16 17  Temp: 98.2 F (36.8 C) 98.1 F (36.7 C)  SpO2: 100% 100%    Intake/Output Summary (Last 24 hours) at 03/18/2020 0817 Last data filed at 03/18/2020 0214 Gross per 24 hour  Intake 1060 ml  Output 1 ml  Net 1059 ml   Filed Weights   03/16/20 0500 03/17/20 0500 03/18/20 0545  Weight: 69.9 kg 70.1 kg 71 kg    Exam:  Constitutional: Sleeping but at baseline is quite pleasant ENT: Patient has bilateral hordeolum: 2 areas on the upper lid and one on the lower lid of the right and one area on the lower lid of the left.  Previous erythema and scleral injection have resolved. Respiratory: Posterior lung sounds are clear and he is stable on room air Cardiovascular: Normal pulse and heart sounds, no peripheral edema and skin is warm and dry Abdomen: Soft with normoactive bowel sounds.  LBM 3/4 Neurologic: CN 2-12 grossly intact. Sensation intact, DTR normal. Strength 5/5 x on left and slight weakness 4/5 on right. Psychiatric: Sleeping but at baseline oriented to name only.  Has significant short-term memory deficits and typically has pleasant but flat/bland affect.   Assessment/Plan: Acute problems: Acute metabolic encephalopathy (resolved) in context of dementia due to to other medical  condition: Suspect secondary to chronic cognitive deficits in the setting of prior CVA as well as likely vascular dementia underlying 2/2 diabetes and hypertension.  MRI from December demonstrated moderate chronic small vessel ischemic disease with chronic lacunar infarcts. Acute encephalopathy has resolved. Is to have significant issues with short-term memory deficits leading to safety concerns.  He is unable to live alone.  In the hospital he has demonstrated wandering behaviors and therefore would best be suited to a locked ALF behavioral/dementia unit. 2/26: SLP cognitive evaluation as follows:  Pt was seen for a cognitive-linguistic evaluation.  Most recent evaluation was on 01/08/2020 with reported cognitive deficits "including reduced attention, reduced problem solving, reduced safety awareness, decreased executive function skills and disorientation."  Per chart review, pt has a history of short-term memory deficits and will wander (decreased safety awareness).  Pt reported that he lives with his parents and that they assist him with medications and finances.  Pt completed the [facility] SLUMS evaluation, assessing for Mild Neurocognitive Disorder and Dementia on this date.  Pt scored overall 5/30, which falls within the Dementia range (1-20).  Patient exhibited global cognitive deficits in orientation, short-term memory, attention, problem solving, safety judgement, and executive functioning.  Please see below for additional information.  Pt will benefit from 24/7 supervision and full assistance with IADLs (medications, finances, etc.) at time of discharge.  All further skilled ST needs can be met at the next venue of care.  Please re-consult if additional needs arise.   [place] Examination Orientation 1/3  Numeric Problem Solving  0/3  Memory  0/5  Attention 0/2  Thought Organization 1/3  Clock Drawing 0/4  Short Story Recall  2/8  Visuospatial Skills  1/2  Total  5/30 (Dementia Range)     Scoring  High School Education  Less than High School Education   Normal  27-30 25-30  Mild Neurocognitive Disorder 21-26 20-24  Dementia  1-20 1-19    2/25: OT cognitive evaluation Arousal/Alertness: Awake/alert Behavior During Therapy: Flat affect Overall Cognitive Status: Impaired/Different from baseline Area of Impairment: Awareness;Following commands;Attention;Problem solving;Memory;Safety/judgement Memory: Decreased short-term memory Following Commands: Follows one step commands with increased time;Follows one step commands consistently Safety/Judgement: Decreased awareness of safety;Decreased awareness of deficits Problem Solving: Slow processing;Requires verbal cues  Bilateral Hordeolum -Continue tobramycin eyedrops for 10 days (initiated on 3/3) note significant improvement -Continue warm compresses  Diabetes mellitus type 2 without hyperglycemia not on long-term insulin: With an A1c of 5.8 Diet controlled  Essential hypertension: Continue amlodipine, hydrochlorothiazide lisinopril  Chronic systolic heart failure: EF of 20% not on beta-blocker due to bradycardia. Continue lisinopril and hydrochlorothiazide  Follow intake and output  History of CVA Continue appropriate blood pressure control Continue Eliquis and Lipitor No chronic focal neurological deficits or gait disturbances noting he ambulates 300 feet with PT  Nutrition Status: Nutrition Problem: Increased nutrient needs Etiology: chronic illness (CHF) Signs/Symptoms: estimated needs Interventions: Magic cup,MVI Estimated body mass index is 21.23 kg/m as calculated from the following:   Height as of this encounter: 6' (1.829 m).   Weight as of this encounter: 71 kg.   Other problems: Acute  kidney injury on chronic kidney C stage IIIa: Acute hypovolemia resolved.  Hypovolemic hypernatremia: Resolved with IV fluid hydration.    Data Reviewed: Basic Metabolic Panel: Recent Labs  Lab  03/15/20 0129  NA 137  K 3.7  CL 100  CO2 29  GLUCOSE 113*  BUN 25*  CREATININE 1.27*  CALCIUM 9.1   Liver Function Tests: Recent Labs  Lab 03/15/20 0129  AST 21  ALT 23  ALKPHOS 53  BILITOT 0.8  PROT 6.2*  ALBUMIN 3.4*   No results for input(s): LIPASE, AMYLASE in the last 168 hours. No results for input(s): AMMONIA in the last 168 hours. CBC: Recent Labs  Lab 03/15/20 0129  WBC 3.8*  HGB 10.0*  HCT 31.5*  MCV 91.6  PLT 94*   Cardiac Enzymes: No results for input(s): CKTOTAL, CKMB, CKMBINDEX, TROPONINI in the last 168 hours. BNP (last 3 results) Recent Labs    11/29/19 0037 01/04/20 1339 01/07/20 1549  BNP 1,278.7* 465.9* 46.1    ProBNP (last 3 results) No results for input(s): PROBNP in the last 8760 hours.  CBG: Recent Labs  Lab 03/14/20 0544 03/15/20 0532 03/16/20 0538 03/17/20 0532 03/18/20 0543  GLUCAP 99 104* 103* 110* 228*    No results found for this or any previous visit (from the past 240 hour(s)).   Studies: No results found.  Scheduled Meds: . amLODipine  10 mg Oral Daily  . apixaban  5 mg Oral BID  . atorvastatin  80 mg Oral Daily  . hydrALAZINE  10 mg Oral Q8H  . hydrochlorothiazide  25 mg Oral Daily  . lisinopril  40 mg Oral Daily  . magnesium oxide  400 mg Oral BID  . multivitamin with minerals  1 tablet Oral Daily  . 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) Active Problems:   Diabetes mellitus without complication ([place])   History of CVA (cerebrovascular accident)   Essential hypertension   CKD (chronic kidney disease), stage III (HCC)   Hypernatremia   Chronic CHF (congestive heart failure) (HCC)   Acute hypernatremia   Hordeolum externum of right upper eyelid   Hordeolum externum of right lower eyelid   Hordeolum externum of left lower  eyelid   Consultants:  None  Procedures:  None  Antibiotics: Anti-infectives (From admission, onward)   None       Time spent: 20 minutes    Erin Hearing ANP  [facility] 7 am - 330 pm/M-F for direct patient care and secure chat Please refer to Amion for contact info 54  days

## 2020-03-19 LAB — TECHNOLOGIST SMEAR REVIEW: Plt Morphology: NORMAL

## 2020-03-19 LAB — CBC WITH DIFFERENTIAL/PLATELET
Abs Immature Granulocytes: 0.01 10*3/uL (ref 0.00–0.07)
Basophils Absolute: 0 10*3/uL (ref 0.0–0.1)
Basophils Relative: 1 %
Eosinophils Absolute: 0.1 10*3/uL (ref 0.0–0.5)
Eosinophils Relative: 3 %
HCT: 32.9 % — ABNORMAL LOW (ref 39.0–52.0)
Hemoglobin: 10.4 g/dL — ABNORMAL LOW (ref 13.0–17.0)
Immature Granulocytes: 0 %
Lymphocytes Relative: 44 %
Lymphs Abs: 1.5 10*3/uL (ref 0.7–4.0)
MCH: 29.6 pg (ref 26.0–34.0)
MCHC: 31.6 g/dL (ref 30.0–36.0)
MCV: 93.7 fL (ref 80.0–100.0)
Monocytes Absolute: 0.4 10*3/uL (ref 0.1–1.0)
Monocytes Relative: 11 %
Neutro Abs: 1.4 10*3/uL — ABNORMAL LOW (ref 1.7–7.7)
Neutrophils Relative %: 41 %
Platelets: 104 10*3/uL — ABNORMAL LOW (ref 150–400)
RBC: 3.51 MIL/uL — ABNORMAL LOW (ref 4.22–5.81)
RDW: 13.2 % (ref 11.5–15.5)
WBC: 3.4 10*3/uL — ABNORMAL LOW (ref 4.0–10.5)
nRBC: 0 % (ref 0.0–0.2)

## 2020-03-19 LAB — PROTIME-INR
INR: 1.2 (ref 0.8–1.2)
Prothrombin Time: 14.7 seconds (ref 11.4–15.2)

## 2020-03-19 LAB — HEPATIC FUNCTION PANEL
ALT: 25 U/L (ref 0–44)
AST: 20 U/L (ref 15–41)
Albumin: 3.7 g/dL (ref 3.5–5.0)
Alkaline Phosphatase: 58 U/L (ref 38–126)
Bilirubin, Direct: <0.1 mg/dL (ref 0.0–0.2)
Total Bilirubin: 0.6 mg/dL (ref 0.3–1.2)
Total Protein: 6.7 g/dL (ref 6.5–8.1)

## 2020-03-19 LAB — BASIC METABOLIC PANEL
Anion gap: 7 (ref 5–15)
BUN: 20 mg/dL (ref 6–20)
CO2: 33 mmol/L — ABNORMAL HIGH (ref 22–32)
Calcium: 9.4 mg/dL (ref 8.9–10.3)
Chloride: 98 mmol/L (ref 98–111)
Creatinine, Ser: 1.37 mg/dL — ABNORMAL HIGH (ref 0.61–1.24)
GFR, Estimated: >60 mL/min (ref >60)
Glucose, Bld: 138 mg/dL — ABNORMAL HIGH (ref 70–99)
Potassium: 3.9 mmol/L (ref 3.5–5.1)
Sodium: 138 mmol/L (ref 135–145)

## 2020-03-19 LAB — FOLATE: Folate: 18.7 ng/mL (ref >5.9)

## 2020-03-19 LAB — TYPE AND SCREEN
ABO/RH(D): O POS
Antibody Screen: NEGATIVE

## 2020-03-19 LAB — GLUCOSE, CAPILLARY: Glucose-Capillary: 114 mg/dL — ABNORMAL HIGH (ref 70–99)

## 2020-03-19 LAB — CORTISOL: Cortisol, Plasma: 9.3 ug/dL

## 2020-03-19 LAB — FIBRINOGEN: Fibrinogen: 403 mg/dL (ref 210–475)

## 2020-03-19 LAB — ABO/RH: ABO/RH(D): O POS

## 2020-03-19 LAB — VITAMIN B12: Vitamin B-12: 911 pg/mL (ref 180–914)

## 2020-03-19 MED ORDER — DONEPEZIL HCL 5 MG PO TABS: 5.0000 mg | ORAL_TABLET | Freq: Every day | ORAL | Status: DC

## 2020-03-19 NOTE — Progress Notes (Signed)
PT Cancellation Note  Patient Details Name: Kevin [name] MRN: [id number] DOB: Aug 26, 1965   Cancelled Treatment:    Reason Eval/Treat Not Completed: Fatigue/lethargy limiting ability to participate - pt declines mobility, states "I can't right now, come back in 30 minutes" but when PT states she cannot come back today pt agrees to mobility tomorrow. Will check back.  Marye Round, PT Acute Rehabilitation Services Pager [phone]  Office [phone]    Truddie Coco 03/19/2020, 4:13 PM

## 2020-03-19 NOTE — Progress Notes (Signed)
[facility] PROGRESS NOTE  Kevin [name] [facility] LXB:[id number] DOB: [birth year] DOA: 01/23/2020 PCP: Medicine, [facility]  Status: Remains inpatient appropriate because:Altered mental status and Unsafe d/c plan  Dispo:  Patient From:  HOME  Planned Disposition: Skilled Nursing Facility/SNF vs ALF w/ locked dementia unit-[facility] reviewing clinicals--clinicals have been sent to [facility] SNF  Medically stable for discharge: YesYES             Barriers to discharge: Patient has underlying dementia and is not safe to discharge home independently.  He does not meet requirements for SNF due to stable mobility but would benefit from ALF that has a locked dementia unit.  Given his mental status issues he would be at risk for medication nonadherence in context of his severe heart failure and hypertension issues which all contributed to his stroke.  He also has diabetes therefore in addition to needing to maintain an appropriate cardiac and fluid restricted diet he also needs to be on a carbohydrate restricted diet to keep diabetes under control to further minimize worsening of cardiovascular disease.             Difficult to place: Yes     Level of care: Med-Surg  Code Status: Full Family Communication: HIPAA appropriate voicemail left for patient's mother [name] on 3/1 DVT prophylaxis: Eliquis Vaccination status: Unknown  Foley catheter: Condom cath  HPI: 55 y.o. male past medical history of systolic heart failure with an EF of 35% stage IIIa, CVA diabetes mellitus presents secondary to progressive weakness and decline in the setting of recent stroke December 2021.  Patient was recently discharged from the hospital on 12/28 after admission for acute CVA.  He was discharged to home with home health PT/OT/RN and home health aide under the care of his elderly parents.  Patient has a history of noncompliance.  Patient to the hospital family reported generalized decline after being  discharged home.  He was supposed to have home health set up but did not have insurance and the home health agency that was working with him would not accept cash from the family.  The family reported decreased oral intake and patient wincing when swallowing solids although the patient denied pain with swallowing.  He has chronic residual right-sided weakness and cognitive deficits since his stroke.  Completed on date of admission revealed multiple infratentorial and supratentorial remote lacunar infarcts without any acute abnormality  Subjective: Awake.  No specific complaints reported.  Objective: Vitals:   03/18/20 2019 03/19/20 0443  BP: 135/86 (!) 139/93  Pulse: 82 62  Resp: 18 18  Temp: 98 F (36.7 C) 98.3 F (36.8 C)  SpO2: 100% 100%    Intake/Output Summary (Last 24 hours) at 03/19/2020 0842 Last data filed at 03/19/2020 0445 Gross per 24 hour  Intake 880 ml  Output 1 ml  Net 879 ml   Filed Weights   03/17/20 0500 03/18/20 0545 03/19/20 0500  Weight: 70.1 kg 71 kg 71.2 kg    Exam:  Constitutional: Calm, no acute distress ENT: Bilateral hordeolum have resolved Respiratory: Lungs clear to auscultation stable on room Cardiovascular: Normal heart sounds, no peripheral edema or JVD.  Extremities warm to touch Abdomen: Soft and nontender.  Eating 100% of meals.  LBM 3/4 Neurologic: CN 2-12 grossly intact. Sensation intact, DTR normal. Strength 5/5 x on left and slight weakness 4/5 on right. Psychiatric: Awake, oriented to person and place.  Not to year.  Initially stated 2002 when asked to restate that he  stated 2003.  Was unable to state reason for admission.   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  MRI from December demonstrated  moderate chronic small vessel ischemic disease with chronic lacunar infarcts. Acute encephalopathy has resolved. Is to have significant issues with short-term memory deficits leading to safety concerns.  He is unable to live alone.  In the hospital he has demonstrated wandering behaviors and therefore would best be suited to a locked ALF behavioral/dementia unit. 2/26: SLP cognitive evaluation as follows:  Pt was seen for a cognitive-linguistic evaluation.  Most recent evaluation was on 01/08/2020 with reported cognitive deficits "including reduced attention, reduced problem solving, reduced safety awareness, decreased executive function skills and disorientation."  Per chart review, pt has a history of short-term memory deficits and will wander (decreased safety awareness).  Pt reported that he lives with his parents and that they assist him with medications and finances.  Pt completed the [facility] SLUMS evaluation, assessing for Mild Neurocognitive Disorder and Dementia on this date.  Pt scored overall 5/30, which falls within the Dementia range (1-20).  Patient exhibited global cognitive deficits in orientation, short-term memory, attention, problem solving, safety judgement, and executive functioning.  Please see below for additional information.  Pt will benefit from 24/7 supervision and full assistance with IADLs (medications, finances, etc.) at time of discharge.  All further skilled ST needs can be met at the next venue of care.  Please re-consult if additional needs arise.   [place] Examination Orientation 1/3  Numeric Problem Solving  0/3  Memory  0/5  Attention 0/2  Thought Organization 1/3  Clock Drawing 0/4  Short Story Recall  2/8  Visuospatial Skills  1/2  Total  5/30 (Dementia Range)    Scoring  High School Education  Less than High School Education   Normal  27-30 25-30  Mild Neurocognitive Disorder 21-26 20-24  Dementia  1-20 1-19    2/25: OT cognitive  evaluation Arousal/Alertness: Awake/alert Behavior During Therapy: Flat affect Overall Cognitive Status: Impaired/Different from baseline Area of Impairment: Awareness;Following commands;Attention;Problem solving;Memory;Safety/judgement Memory: Decreased short-term memory Following Commands: Follows one step commands with increased time;Follows one step commands consistently Safety/Judgement: Decreased awareness of safety;Decreased awareness of deficits Problem Solving: Slow processing;Requires verbal cues  Bilateral Hordeolum -Continue tobramycin eyedrops for 10 days (initiated on 3/3) note significant improvement -Continue warm compresses  Diabetes mellitus type 2 without hyperglycemia not on long-term insulin: With an A1c of 5.8 Diet controlled  Essential hypertension: Continue amlodipine, hydrochlorothiazide lisinopril  Chronic systolic heart failure: EF of 20% not on beta-blocker due to bradycardia. Continue lisinopril and hydrochlorothiazide  Follow intake and output  History of CVA Continue appropriate blood pressure control Continue Eliquis and Lipitor No chronic focal neurological deficits or gait disturbances noting he ambulates 300 feet with PT  Nutrition Status: Nutrition Problem: Increased nutrient needs Etiology: chronic illness (CHF) Signs/Symptoms: estimated needs Interventions: Magic cup,MVI Estimated body mass index is 21.29 kg/m as calculated from the following:   Height as of this encounter: 6' (1.829 m).   Weight as of this encounter: 71.2 kg.   Other problems: Acute kidney injury on chronic kidney C stage IIIa: Acute hypovolemia resolved.  Hypovolemic hypernatremia: Resolved with IV fluid hydration.    Data Reviewed: Basic Metabolic Panel: Recent Labs  Lab 03/15/20 0129  NA 137  K 3.7  CL 100  CO2 29  GLUCOSE 113*  BUN 25*  CREATININE 1.27*  CALCIUM 9.1   Liver Function Tests: Recent Labs  Lab 03/15/20 0129  AST 21  ALT  23  ALKPHOS 53  BILITOT 0.8  PROT 6.2*  ALBUMIN 3.4*   No results for input(s): LIPASE, AMYLASE in the last 168 hours. No results for input(s): AMMONIA in the last 168 hours. CBC: Recent Labs  Lab 03/15/20 0129  WBC 3.8*  HGB 10.0*  HCT 31.5*  MCV 91.6  PLT 94*   Cardiac Enzymes: No results for input(s): CKTOTAL, CKMB, CKMBINDEX, TROPONINI in the last 168 hours. BNP (last 3 results) Recent Labs    11/29/19 0037 01/04/20 1339 01/07/20 1549  BNP 1,278.7* 465.9* 46.1    ProBNP (last 3 results) No results for input(s): PROBNP in the last 8760 hours.  CBG: Recent Labs  Lab 03/15/20 0532 03/16/20 0538 03/17/20 0532 03/18/20 0543 03/19/20 0501  GLUCAP 104* 103* 110* 228* 114*    No results found for this or any previous visit (from the past 240 hour(s)).   Studies: No results found.  Scheduled Meds: . amLODipine  10 mg Oral Daily  . apixaban  5 mg Oral BID  . atorvastatin  80 mg Oral Daily  . hydrALAZINE  10 mg Oral Q8H  . hydrochlorothiazide  25 mg Oral Daily  . lisinopril  40 mg Oral Daily  . magnesium oxide  400 mg Oral BID  . multivitamin with minerals  1 tablet Oral Daily  . 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 [facility]) Active Problems:   Diabetes mellitus without complication ([place])   History of CVA (cerebrovascular accident)   Essential hypertension   CKD (chronic kidney disease), stage III (HCC)   Hypernatremia   Chronic CHF (congestive heart failure) (HCC)   Acute hypernatremia   Hordeolum externum of right upper eyelid   Hordeolum externum of right lower eyelid   Hordeolum externum of left lower eyelid   Consultants:  None  Procedures:  None  Antibiotics: Anti-infectives (From admission, onward)   None       Time spent: 20 minutes    Erin Hearing ANP  [facility] 7 am - 330 pm/M-F for direct patient care and secure  chat Please refer to Amion for contact info 55  days

## 2020-03-19 NOTE — Progress Notes (Addendum)
[facility] PROGRESS NOTE  Patient: Kevin [name] [facility] JFH:[id number]   PCP: Medicine, [facility] DOB: [birth year]   DOA: 01/23/2020   DOS: 03/19/2020    Subjective: No acute complaint.  No nausea no vomiting.  Per RN no difficulty with sleeping.  No agitation.  Objective:    No asterixis on exam. No focal deficit on exam. Patient  Assessment and plan: Initially presented with severe hyponatremia and AKI from poor p.o. intake. Currently that has been corrected. Ongoing issue of confusion and generalized weakness.  Dementia. No clinical evidence of delirium. B12 level 911.  Folate 18.7.  Cortisol 9.3 appropriate.  LFTs stable. Initiate multivitamins. MRI brain negative for any acute infarct but shows multiple remote infarct in the basal ganglia corona radiata left cerebral hemisphere as well as evidence of chronic microvascular ischemic disease with cerebral volume loss and ex vacuo ventricular dilation.  Chronic microhemorrhage in the right frontal lobe as well.. No further work-up. May benefit from initiating Aricept outpatient.  Thrombocytopenia. Chronic in nature. In November 21 platelets were around 100. Platelet remained around 100 Currently as well. Smear unremarkable for any schistocytes. INR normal.  Fibrinogen level normal.  No evidence of hemolysis. May benefit from outpatient referral to hematology.  Dispo: Currently requiring significant assist due to generalized deconditioning.  PT recommends SNF.  Social worker currently discussing with facilities.  Medicaid still pending.  Continue inpatient therapy  Author: Lynden Oxford, MD [facility] 03/19/2020 7:57 PM   If 7PM-7AM, please contact night-coverage at [email or web address]

## 2020-03-20 LAB — GLUCOSE, CAPILLARY: Glucose-Capillary: 115 mg/dL — ABNORMAL HIGH (ref 70–99)

## 2020-03-20 MED ORDER — THIAMINE HCL 100 MG PO TABS
100.0000 mg | ORAL_TABLET | Freq: Every day | ORAL | Status: DC
  Administered 2020-03-20 – 2020-03-26 (×7): 100 mg via ORAL
  Filled 2020-03-20 (×7): qty 1

## 2020-03-20 NOTE — TOC Progression Note (Signed)
Transition of Care [facility]) - Progression Note    Patient Details  Name: Kevin [name] MRN: [id number] Date of Birth: 1965/07/01  Transition of Care [facility]) CM/SW Contact  Curlene Labrum, RN Phone Number: 03/20/2020, 1:21 PM  Clinical Narrative:    Case management met at the patient's bedside in regards to transitions of care.  The patient does not have any bed offers at this time.  Medicaid is pending for this patient and he will need a SNF facility with a Memory care unit and ability to accept LOG for admission.  I called and spoke with Caren Griffins, CM at [facility], who is agreeable to view the patient's clinicals - sent clinicals via fax to [phone].  CM and MSW will continue to follow the patient for SNF admission once bed offer is available.   Expected Discharge Plan: Long Term Nursing Home Barriers to Discharge: SNF Pending bed offer,SNF Pending payor source - LOG,SNF Pending Medicaid  Expected Discharge Plan and Services Expected Discharge Plan: [place] In-house Referral: Clinical Social Work Discharge Planning Services: CM Consult Post Acute Care Choice: Nursing Home ([place]) Living arrangements for the past 2 months: [place]: OT,PT [place]: [place] Date [place]: 01/28/20 Time [place]: [id number] Representative spoke with at [place]: [place] ([place]) Interventions    Readmission Risk Interventions Readmission Risk Prevention Plan 03/18/2020 01/08/2020  Transportation Screening Complete Complete  PCP or Specialist Appt within 5-7 Days - Complete  Home Care Screening - Complete  Medication Review (RN CM) - Complete  Medication Review ([place]) Complete -  PCP or Specialist appointment within 3-5 days of discharge Complete -  [place] or Home Care Consult Complete -  Palliative Care Screening Complete -  [place] Complete -  Some recent data might be hidden

## 2020-03-20 NOTE — Progress Notes (Signed)
[facility] PROGRESS NOTE  Kevin [name] Kevin [name] GUR:[id number] DOB: 1965/10/08 DOA: 01/23/2020 PCP: Kevin [name], Kevin [name]  Status: Remains inpatient appropriate because:Altered mental status and Unsafe d/c plan  Dispo:  Patient From:  HOME  Planned Disposition: Skilled Nursing Facility/SNF vs ALF w/ locked dementia unit-Kevin [name] reviewing clinicals--clinicals have been sent to [name] SNF  Medically stable for discharge: YesYES             Barriers to discharge: Patient has underlying dementia and is not safe to discharge home independently.  He does not meet requirements for SNF due to stable mobility but would benefit from ALF that has a locked dementia unit.  Given his mental status issues he would be at risk for medication nonadherence in context of his severe heart failure and hypertension issues which all contributed to his stroke.  He also has diabetes therefore in addition to needing to maintain an appropriate cardiac and fluid restricted diet he also needs to be on a carbohydrate restricted diet to keep diabetes under control to further minimize worsening of cardiovascular disease.             Difficult to place: Yes     Level of care: Med-Surg  Code Status: Full Family Communication: HIPAA appropriate voicemail left for patient's mother Kevin [name] on 3/1 DVT prophylaxis: Eliquis Vaccination status: Unknown  Foley catheter: Condom cath  HPI: 55 y.o. male past medical history of systolic heart failure with an EF of 35% stage IIIa, CVA diabetes mellitus presents secondary to progressive weakness and decline in the setting of recent stroke December 2021.  Patient was recently discharged from the hospital on 12/28 after admission for acute CVA.  He was discharged to home with home health PT/OT/RN and home health aide under the care of his elderly parents.  Patient has a history of noncompliance.  Patient to the hospital family reported generalized decline after being  discharged home.  He was supposed to have home health set up but did not have insurance and the home health agency that was working with him would not accept cash from the family.  The family reported decreased oral intake and patient wincing when swallowing solids although the patient denied pain with swallowing.  He has chronic residual right-sided weakness and cognitive deficits since his stroke.  Completed on date of admission revealed multiple infratentorial and supratentorial remote lacunar infarcts without any acute abnormality  Subjective: Awake, continues with flat affect and minimal interaction.  No complaints.  Objective: Vitals:   03/19/20 1330 03/20/20 0455  BP: 114/76 133/69  Pulse: 67 62  Resp: 14 17  Temp:  98 F (36.7 C)  SpO2: 100% 100%    Intake/Output Summary (Last 24 hours) at 03/20/2020 0842 Last data filed at 03/20/2020 0500 Gross per 24 hour  Intake 1080 ml  Output --  Net 1080 ml   Filed Weights   03/18/20 0545 03/19/20 0500 03/20/20 0500  Weight: 71 kg 71.2 kg 72.8 kg    Exam:  Constitutional:, No acute distress ENT: Bilateral hordeolum have improved and have almost resolved Respiratory: Anterior lung sounds clear to auscultation, stable on room air Cardiovascular: Normal heart sounds, pulse regular, no edema Abdomen: Soft and nontender.  Eating 100% of meals.  LBM 3/4 Neurologic: CN 2-12 grossly intact. Sensation intact, DTR normal. Strength 5/5 x on left and slight weakness 4/5 on right. Psychiatric: Awake and oriented times name and place but not to year or situation.  Flat but pleasant   Assessment/Plan:  Acute problems: Acute metabolic encephalopathy (resolved) in context of dementia due to to other medical condition: Suspect secondary to chronic cognitive deficits in the setting of prior CVA as well as likely vascular dementia underlying 2/2 diabetes and hypertension.  MRI from December demonstrated moderate chronic small vessel ischemic disease  with chronic lacunar infarcts. Acute encephalopathy has resolved. Is to have significant issues with short-term memory deficits leading to safety concerns.  He is unable to live alone.  In the hospital he has demonstrated wandering behaviors and therefore would best be suited to a locked ALF behavioral/dementia unit. 2/26: SLP cognitive evaluation as follows:  Pt was seen for a cognitive-linguistic evaluation.  Most recent evaluation was on 01/08/2020 with reported cognitive deficits "including reduced attention, reduced problem solving, reduced safety awareness, decreased executive function skills and disorientation."  Per chart review, pt has a history of short-term memory deficits and will wander (decreased safety awareness).  Pt reported that he lives with his parents and that they assist him with medications and finances.  Pt completed the Kevin [name] SLUMS evaluation, assessing for Mild Neurocognitive Disorder and Dementia on this date.  Pt scored overall 5/30, which falls within the Dementia range (1-20).  Patient exhibited global cognitive deficits in orientation, short-term memory, attention, problem solving, safety judgement, and executive functioning.  Please see below for additional information.  Pt will benefit from 24/7 supervision and full assistance with IADLs (medications, finances, etc.) at time of discharge.  All further skilled ST needs can be met at the next venue of care.  Please re-consult if additional needs arise.   Kevin [name] Examination Orientation 1/3  Numeric Problem Solving  0/3  Memory  0/5  Attention 0/2  Thought Organization 1/3  Clock Drawing 0/4  Short Story Recall  2/8  Visuospatial Skills  1/2  Total  5/30 (Dementia Range)    Scoring  High School Education  Less than High School Education   Normal  27-30 25-30  Mild Neurocognitive Disorder 21-26 20-24  Dementia  1-20 1-19    2/25: OT cognitive evaluation Arousal/Alertness: Awake/alert Behavior During Therapy:  Flat affect Overall Cognitive Status: Impaired/Different from baseline Area of Impairment: Awareness;Following commands;Attention;Problem solving;Memory;Safety/judgement Memory: Decreased short-term memory Following Commands: Follows one step commands with increased time;Follows one step commands consistently Safety/Judgement: Decreased awareness of safety;Decreased awareness of deficits Problem Solving: Slow processing;Requires verbal cues  Bilateral Hordeolum -Continue tobramycin eyedrops for 10 days (initiated on 3/3) note significant improvement -Continue warm compresses  Diabetes mellitus type 2 without hyperglycemia not on long-term insulin: With an A1c of 5.8 Diet controlled  Essential hypertension: Continue amlodipine, hydrochlorothiazide lisinopril  Chronic systolic heart failure: EF of 20% not on beta-blocker due to bradycardia. Continue lisinopril and hydrochlorothiazide  Follow intake and output  History of CVA Continue appropriate blood pressure control Continue Eliquis and Lipitor No chronic focal neurological deficits or gait disturbances noting he ambulates 300 feet with PT  Nutrition Status: Nutrition Problem: Increased nutrient needs Etiology: chronic illness (CHF) Signs/Symptoms: estimated needs Interventions: Magic cup,MVI Estimated body mass index is 21.77 kg/m as calculated from the following:   Height as of this encounter: 6' (1.829 m).   Weight as of this encounter: 72.8 kg.   Other problems: Acute kidney injury on chronic kidney C stage IIIa: Acute hypovolemia resolved.  Hypovolemic hypernatremia: Resolved with IV fluid hydration.    Data Reviewed: Basic Metabolic Panel: Recent Labs  Lab 03/15/20 0129 03/19/20 1111  NA 137 138  K 3.7 3.9  CL 100 98  CO2 29 33*  GLUCOSE 113* 138*  BUN 25* 20  CREATININE 1.27* 1.37*  CALCIUM 9.1 9.4   Liver Function Tests: Recent Labs  Lab 03/15/20 0129 03/19/20 1111  AST 21 20  ALT 23  25  ALKPHOS 53 58  BILITOT 0.8 0.6  PROT 6.2* 6.7  ALBUMIN 3.4* 3.7   No results for input(s): LIPASE, AMYLASE in the last 168 hours. No results for input(s): AMMONIA in the last 168 hours. CBC: Recent Labs  Lab 03/15/20 0129 03/19/20 1111  WBC 3.8* 3.4*  NEUTROABS  --  1.4*  HGB 10.0* 10.4*  HCT 31.5* 32.9*  MCV 91.6 93.7  PLT 94* 104*   Cardiac Enzymes: No results for input(s): CKTOTAL, CKMB, CKMBINDEX, TROPONINI in the last 168 hours. BNP (last 3 results) Recent Labs    11/29/19 0037 01/04/20 1339 01/07/20 1549  BNP 1,278.7* 465.9* 46.1    ProBNP (last 3 results) No results for input(s): PROBNP in the last 8760 hours.  CBG: Recent Labs  Lab 03/16/20 0538 03/17/20 0532 03/18/20 0543 03/19/20 0501 03/20/20 0500  GLUCAP 103* 110* 228* 114* 115*    No results found for this or any previous visit (from the past 240 hour(s)).   Studies: No results found.  Scheduled Meds: . amLODipine  10 mg Oral Daily  . apixaban  5 mg Oral BID  . atorvastatin  80 mg Oral Daily  . hydrochlorothiazide  25 mg Oral Daily  . lisinopril  40 mg Oral Daily  . magnesium oxide  400 mg Oral BID  . multivitamin with minerals  1 tablet Oral Daily  . thiamine  100 mg Oral Daily  . 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 [facility]) Active Problems:   Diabetes mellitus without complication ([place])   History of CVA (cerebrovascular accident)   Essential hypertension   CKD (chronic kidney disease), stage III (HCC)   Hypernatremia   Chronic CHF (congestive heart failure) (HCC)   Acute hypernatremia   Hordeolum externum of right upper eyelid   Hordeolum externum of right lower eyelid   Hordeolum externum of left lower eyelid   Consultants:  None  Procedures:  None  Antibiotics: Anti-infectives (From admission, onward)   None       Time spent: 20 minutes    Erin Hearing  ANP  [facility] 7 am - 330 pm/M-F for direct patient care and secure chat Please refer to Amion for contact info 56  days

## 2020-03-20 NOTE — Progress Notes (Signed)
Pt normally gets up and uses bathroom without issue. This afternoon, pt urinated on self in bed. He wasn't able to say why he wasn't able to make it to the bathroom.

## 2020-03-20 NOTE — Progress Notes (Signed)
Physical Therapy Treatment Patient Details Name: Kevin [name] MRN: [id number] DOB: April 25, 1965 Today's Date: 03/20/2020    History of Present Illness Pt is a 55 y.o. male admitted 01/23/20 with weakness and generalized decline at home; workup for metabolic encephalopathy, hyponatremia, AKI on CKD. 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    PT Comments    Pt resting in room upon PT arrival to room, agreeable to hallway ambulation but requires increased time to initiate mobility. Pt soiled in urine, discovered once pt sat EOB, and pt unaware. Pt ambulatory around the unit without an AD, demonstrates unsteadiness, weaving of gait, and scissoring with challenge (ie horizontal head turns). PT to continue to follow acutely, 1x/week frequency for maintenance.    Follow Up Recommendations  SNF (vs. ALF for long term care based on cognitive deficits.)     Equipment Recommendations       Recommendations for Other Services       Precautions / Restrictions Precautions Precautions: Fall Precaution Comments: L eye blindness Restrictions Weight Bearing Restrictions: No    Mobility  Bed Mobility Overal bed mobility: Needs Assistance Bed Mobility: Supine to Sit     Supine to sit: Modified independent (Device/Increase time)     General bed mobility comments: increased time and cuing to perform    Transfers Overall transfer level: Needs assistance Equipment used: None Transfers: Sit to/from Stand Sit to Stand: Supervision         General transfer comment: for safety, rise. slow to rise and steady  Ambulation/Gait Ambulation/Gait assistance: Supervision Gait Distance (Feet): 500 Feet Assistive device: None Gait Pattern/deviations: Step-through pattern;Narrow base of support;Drifts right/left;Scissoring Gait velocity: WFL   General Gait Details: supervision for safety, pt with weaving of gait with head turns,  directional changes, distraction   Stairs             Wheelchair Mobility    Modified Rankin (Stroke Patients Only) Modified Rankin (Stroke Patients Only) Pre-Morbid Rankin Score: No significant disability Modified Rankin: Moderate disability     Balance Overall balance assessment: Needs assistance Sitting-balance support: No upper extremity supported;Feet supported Sitting balance-Leahy Scale: Good     Standing balance support: No upper extremity supported;During functional activity Standing balance-Leahy Scale: Fair                 High Level Balance Comments: weaving of gait with horizontal head turns, pt declines further gait challenges            Cognition Arousal/Alertness: Awake/alert Behavior During Therapy: Impulsive;Flat affect Overall Cognitive Status: Impaired/Different from baseline Area of Impairment: Problem solving;Safety/judgement                         Safety/Judgement: Decreased awareness of safety;Decreased awareness of deficits   Problem Solving: Requires verbal cues;Requires tactile cues;Difficulty sequencing;Slow processing General Comments: pt washing up and attempting to wash face with twoel he performed pericare with, requires cues to get a new towel. Pt soiled in urine upon arrival to room, unaware of this. Lacks insight into higher level balance deficits      Exercises      General Comments        Pertinent Vitals/Pain Pain Assessment: No/denies pain Pain Intervention(s): Monitored during session    Home Living                      Prior Function  PT Goals (current goals can now be found in the care plan section) Acute Rehab PT Goals Patient Stated Goal: none stated Time For Goal Achievement: 04/03/20 Potential to Achieve Goals: Fair Additional Goals Additional Goal #1: Pt will score >19 on DGI to indicate low fall risk Progress towards PT goals: Progressing toward goals     Frequency    Min 1X/week      PT Plan Current plan remains appropriate    Co-evaluation              AM-PAC PT "6 Clicks" Mobility   Outcome Measure  Help needed turning from your back to your side while in a flat bed without using bedrails?: None Help needed moving from lying on your back to sitting on the side of a flat bed without using bedrails?: None Help needed moving to and from a bed to a chair (including a wheelchair)?: None Help needed standing up from a chair using your arms (e.g., wheelchair or bedside chair)?: None Help needed to walk in hospital room?: A Little Help needed climbing 3-5 steps with a railing? : A Little 6 Click Score: 22    End of Session Equipment Utilized During Treatment: Gait belt Activity Tolerance: Patient tolerated treatment well Patient left: with call bell/phone within reach;in chair;with chair alarm set Nurse Communication: Mobility status PT Visit Diagnosis: Unsteadiness on feet (R26.81);Muscle weakness (generalized) (M62.81)     Time: 4888-9169 PT Time Calculation (min) (ACUTE ONLY): 20 min  Charges:  $Therapeutic Exercise: 8-22 mins                    Marye Round, PT Acute Rehabilitation Services Pager [phone]  Office [phone]    Truddie Coco 03/20/2020, 3:49 PM

## 2020-03-21 LAB — URINALYSIS, COMPLETE (UACMP) WITH MICROSCOPIC
Bacteria, UA: NONE SEEN
Bilirubin Urine: NEGATIVE
Glucose, UA: NEGATIVE mg/dL
Hgb urine dipstick: NEGATIVE
Ketones, ur: NEGATIVE mg/dL
Leukocytes,Ua: NEGATIVE
Nitrite: NEGATIVE
Protein, ur: NEGATIVE mg/dL
Specific Gravity, Urine: 1.017 (ref 1.005–1.030)
pH: 6 (ref 5.0–8.0)

## 2020-03-21 LAB — GLUCOSE, CAPILLARY: Glucose-Capillary: 119 mg/dL — ABNORMAL HIGH (ref 70–99)

## 2020-03-21 MED ORDER — SENNOSIDES-DOCUSATE SODIUM 8.6-50 MG PO TABS: 1.0000 | ORAL_TABLET | Freq: Every evening | ORAL | Status: DC | PRN

## 2020-03-21 NOTE — Progress Notes (Signed)
[facility] PROGRESS NOTE  Patient: Kevin [name] [facility] DVO:[id number]   PCP: Medicine, [facility] DOB: 1965/06/30   DOA: 01/23/2020   DOS: 03/21/2020    Subjective: 1 episode of incontinence on 3/10. No other acute issues.   Assessment and plan: HLD:  LDL 149 on 01/05/20. On lipitor 80 mg daily. Will check lipid panel to adjust med.   Thrombocytopenia  Stable. Weekly CBC  CKD II Stable weekly BMP.   Incontinence  So far only One episode. Will monitor.  Author: Lynden Oxford, MD [facility] 03/21/2020 11:09 AM   If 7PM-7AM, please contact night-coverage at [email or web address]

## 2020-03-21 NOTE — TOC Progression Note (Signed)
Transition of Care [facility]) - Progression Note    Patient Details  Name: GABRIELLA GUILE MRN: [id number] Date of Birth: 1965/06/03  [facility] CM/SW Contact  Janae Bridgeman, RN Phone Number: 03/21/2020, 10:55 AM  Clinical Narrative:    Case management spoke with Aram Beecham, CM at [facility] this morning and the patient's clinicals are currently under review by the corporate office for possible admission / bed offer for SNF placement for LTC placement.  The patient currently does not have any bed offers.  Medicaid is currently pending.  CM will continue to follow the patient for LTC placement.   Expected Discharge Plan: Long Term Nursing Home Barriers to Discharge: SNF Pending bed offer,SNF Pending payor source - LOG,SNF Pending Medicaid  Expected Discharge Plan and Services Expected Discharge Plan: Long Term Nursing Home In-house Referral: Clinical Social Work Discharge Planning Services: CM Consult Post Acute Care Choice: Nursing Home (LTC Nursing Home) Living arrangements for the past 2 months: Single Family Home                           HH Arranged: OT,PT HH Agency: [facility] Date [facility] Agency Contacted: 01/28/20 Time HH Agency Contacted: 1425 Representative spoke with at [facility] Agency: Kandee Keen   Social Determinants of Health (SDOH) Interventions    Readmission Risk Interventions Readmission Risk Prevention Plan 03/18/2020 01/08/2020  Transportation Screening Complete Complete  PCP or Specialist Appt within 5-7 Days - Complete  Home Care Screening - Complete  Medication Review (RN CM) - Complete  Medication Review (RN Care Manager) Complete -  PCP or Specialist appointment within 3-5 days of discharge Complete -  HRI or Home Care Consult Complete -  Palliative Care Screening Complete -  Skilled Nursing Facility Complete -  Some recent data might be hidden

## 2020-03-21 NOTE — Progress Notes (Signed)
[facility] PROGRESS NOTE  Akio Hudnall [facility] QHU:[id number] DOB: [birth year] DOA: 01/23/2020 PCP: Medicine, [facility]  Status: Remains inpatient appropriate because:Altered mental status and Unsafe d/c plan  Dispo:  Patient From:  HOME  Planned Disposition: Skilled Nursing Facility/SNF vs ALF w/ locked dementia unit-[facility] reviewing clinicals--clinicals have been sent to [facility] SNF  Medically stable for discharge: YesYES             Barriers to discharge: Patient has underlying dementia and is not safe to discharge home independently.  He does not meet requirements for SNF due to stable mobility but would benefit from ALF that has a locked dementia unit.  Given his mental status issues he would be at risk for medication nonadherence in context of his severe heart failure and hypertension issues which all contributed to his stroke.  He also has diabetes therefore in addition to needing to maintain an appropriate cardiac and fluid restricted diet he also needs to be on a carbohydrate restricted diet to keep diabetes under control to further minimize worsening of cardiovascular disease.             Difficult to place: Yes     Level of care: Med-Surg  Code Status: Full Family Communication: HIPAA appropriate voicemail left for patient's mother [name] on 3/1 DVT prophylaxis: Eliquis Vaccination status: Unknown  Foley catheter: Condom cath  HPI: 55 y.o. male past medical history of systolic heart failure with an EF of 35% stage IIIa, CVA diabetes mellitus presents secondary to progressive weakness and decline in the setting of recent stroke December 2021.  Patient was recently discharged from the hospital on 12/28 after admission for acute CVA.  He was discharged to home with home health PT/OT/RN and home health aide under the care of his elderly parents.  Patient has a history of noncompliance.  Patient to the hospital family reported generalized decline after being  discharged home.  He was supposed to have home health set up but did not have insurance and the home health agency that was working with him would not accept cash from the family.  The family reported decreased oral intake and patient wincing when swallowing solids although the patient denied pain with swallowing.  He has chronic residual right-sided weakness and cognitive deficits since his stroke.  Completed on date of admission revealed multiple infratentorial and supratentorial remote lacunar infarcts without any acute abnormality  Subjective: .  Sitting up in chair.  When asked if he was having a good day he stated "no".  When asked why he said "I need them to bring me my things, I need my pants".  No other complaints voiced.  Objective: Vitals:   03/20/20 2102 03/21/20 0508  BP: (!) 148/87 (!) 137/102  Pulse: 66 65  Resp: 17 18  Temp: (!) 97.5 F (36.4 C) 98.3 F (36.8 C)  SpO2: 99% 100%    Intake/Output Summary (Last 24 hours) at 03/21/2020 0831 Last data filed at 03/20/2020 1715 Gross per 24 hour  Intake 720 ml  Output --  Net 720 ml   Filed Weights   03/18/20 0545 03/19/20 0500 03/20/20 0500  Weight: 71 kg 71.2 kg 72.8 kg    Exam:  Constitutional: Calm, in no acute distress. ENT: Bilateral hordeolum have improved and have almost resolved Respiratory: Lungs are clear to auscultation, stable on room air and no reports of dyspnea on exertion Cardiac: Normal heart sounds, no JVD, no peripheral edema, no tachycardia Abdomen: Soft nontender with normoactive  bowel sounds.  Eats well.  LBM 3/9 Neurologic: CN 2-12 grossly intact. Sensation intact, DTR normal. Strength 5/5 x on left and slight weakness 4/5 on right. Psychiatric: Awake and oriented times name and place but not to year or situation.  Flat but pleasant.  On 3/10 was incontinent of urine.  Nurse documented that typically patient able to ambulate without difficulty to bathroom but was unable to explain to nurse while he  was unable to make it to the bathroom.   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  MRI from December demonstrated moderate chronic small vessel ischemic disease with chronic lacunar infarcts. Acute encephalopathy has resolved. Is to have significant issues with short-term memory deficits leading to safety concerns.  He is unable to live alone.  In the hospital he has demonstrated wandering behaviors and therefore would best be suited to a locked ALF behavioral/dementia unit. 2/26: SLP cognitive evaluation as follows:  Pt was seen for a cognitive-linguistic evaluation.  Most recent evaluation was on 01/08/2020 with reported cognitive deficits "including reduced attention, reduced problem solving, reduced safety awareness, decreased executive function skills and disorientation."  Per chart review, pt has a history of short-term memory deficits and will wander (decreased safety awareness).  Pt reported that he lives with his parents and that they assist him with medications and finances.  Pt completed the [facility]MS evaluation, assessing for Mild Neurocognitive Disorder and Dementia on this date.  Pt scored overall 5/30, which falls within the Dementia range (1-20).  Patient exhibited global cognitive deficits in orientation, short-term memory, attention, problem solving, safety judgement, and executive functioning.  Please see below for additional information.  Pt will benefit from 24/7 supervision and full assistance with IADLs (medications, finances, etc.) at time of discharge.  All further skilled ST needs can be met at the next venue of care.  Please re-consult if additional needs arise.   Gays Examination Orientation 1/3  Numeric Problem Solving  0/3   Memory  0/5  Attention 0/2  Thought Organization 1/3  Clock Drawing 0/4  Short Story Recall  2/8  Visuospatial Skills  1/2  Total  5/30 (Dementia Range)    Scoring  High School Education  Less than High School Education   Normal  27-30 25-30  Mild Neurocognitive Disorder 21-26 20-24  Dementia  1-20 1-19    2/25: OT cognitive evaluation Arousal/Alertness: Awake/alert Behavior During Therapy: Flat affect Overall Cognitive Status: Impaired/Different from baseline Area of Impairment: Awareness;Following commands;Attention;Problem solving;Memory;Safety/judgement Memory: Decreased short-term memory Following Commands: Follows one step commands with increased time;Follows one step commands consistently Safety/Judgement: Decreased awareness of safety;Decreased awareness of deficits Problem Solving: Slow processing;Requires verbal cues  Bilateral Hordeolum -Continue tobramycin eyedrops for 10 days (initiated on 3/3) note significant improvement -Continue warm compresses  Diabetes mellitus type 2 without hyperglycemia not on long-term insulin: With an A1c of 5.8 Diet controlled  Essential hypertension: Continue amlodipine, hydrochlorothiazide lisinopril  Chronic systolic heart failure: EF of 20% not on beta-blocker due to bradycardia. Continue lisinopril and hydrochlorothiazide  Follow intake and output  History of CVA Continue appropriate blood pressure control Continue Eliquis and Lipitor No chronic focal neurological deficits or gait disturbances noting he ambulates 300 feet with PT  Nutrition Status: Nutrition Problem: Increased nutrient needs Etiology: chronic illness (CHF) Signs/Symptoms: estimated needs  Interventions: Magic cup,MVI Estimated body mass index is 21.77 kg/m as calculated from the following:   Height as of this encounter: 6' (1.829 m).   Weight as of this encounter: 72.8 kg.   Other problems: Acute kidney injury on chronic kidney C stage  IIIa: Acute hypovolemia resolved.  Hypovolemic hypernatremia: Resolved with IV fluid hydration.    Data Reviewed: Basic Metabolic Panel: Recent Labs  Lab 03/15/20 0129 03/19/20 1111  NA 137 138  K 3.7 3.9  CL 100 98  CO2 29 33*  GLUCOSE 113* 138*  BUN 25* 20  CREATININE 1.27* 1.37*  CALCIUM 9.1 9.4   Liver Function Tests: Recent Labs  Lab 03/15/20 0129 03/19/20 1111  AST 21 20  ALT 23 25  ALKPHOS 53 58  BILITOT 0.8 0.6  PROT 6.2* 6.7  ALBUMIN 3.4* 3.7   No results for input(s): LIPASE, AMYLASE in the last 168 hours. No results for input(s): AMMONIA in the last 168 hours. CBC: Recent Labs  Lab 03/15/20 0129 03/19/20 1111  WBC 3.8* 3.4*  NEUTROABS  --  1.4*  HGB 10.0* 10.4*  HCT 31.5* 32.9*  MCV 91.6 93.7  PLT 94* 104*   Cardiac Enzymes: No results for input(s): CKTOTAL, CKMB, CKMBINDEX, TROPONINI in the last 168 hours. BNP (last 3 results) Recent Labs    11/29/19 0037 01/04/20 1339 01/07/20 1549  BNP 1,278.7* 465.9* 46.1    ProBNP (last 3 results) No results for input(s): PROBNP in the last 8760 hours.  CBG: Recent Labs  Lab 03/17/20 0532 03/18/20 0543 03/19/20 0501 03/20/20 0500 03/21/20 0507  GLUCAP 110* 228* 114* 115* 119*    No results found for this or any previous visit (from the past 240 hour(s)).   Studies: No results found.  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[place]) Active Problems:   Diabetes mellitus without complication ([place])   History of CVA (cerebrovascular accident)   Essential hypertension   CKD (chronic kidney  disease), stage III (HCC)   Hypernatremia   Chronic CHF (congestive heart failure) (HCC)   Acute hypernatremia   Hordeolum externum of right upper eyelid   Hordeolum externum of right lower eyelid   Hordeolum externum of left lower eyelid   Consultants:  None  Procedures:  None  Antibiotics: Anti-infectives (From admission, onward)   None       Time spent: 20 minutes    Erin Hearing ANP  [facility] 7 am - 330 pm/M-F for direct patient care and secure chat Please refer to Amion for contact info 57  days

## 2020-03-21 NOTE — Progress Notes (Signed)
Pt had one incident of urinating on himself in the morning.  Collected urine sample for analysis near end of shift.  Pt kept asking for his uniform so he could go to work at the beginning of the shift.  Pt calm and cooperative remainder of the shift.

## 2020-03-22 LAB — LIPID PANEL
Cholesterol: 107 mg/dL (ref 0–200)
HDL: 51 mg/dL (ref >40)
LDL Cholesterol: 49 mg/dL (ref 0–99)
Total CHOL/HDL Ratio: 2.1 RATIO
Triglycerides: 34 mg/dL (ref <150)
VLDL: 7 mg/dL (ref 0–40)

## 2020-03-22 LAB — GLUCOSE, CAPILLARY: Glucose-Capillary: 93 mg/dL (ref 70–99)

## 2020-03-22 MED ORDER — ATORVASTATIN CALCIUM 40 MG PO TABS
40.0000 mg | ORAL_TABLET | Freq: Every day | ORAL | Status: DC
  Administered 2020-03-23 – 2020-03-26 (×4): 40 mg via ORAL
  Filled 2020-03-22 (×4): qty 1

## 2020-03-22 NOTE — Progress Notes (Addendum)
[facility] PROGRESS NOTE  Patient: Kevin [name] [facility] FBP:[id number]   PCP: [facility], [facility] DOB: 11-22-1965   DOA: 01/23/2020   DOS: 03/22/2020    Subjective: No acute complaint for nausea no vomiting but no further episode of incontinence.  Objective:  Vitals:   03/22/20 1350 03/22/20 2004  BP: 119/72 (!) 142/75  Pulse: 74 61  Resp: 16 17  Temp: 98.2 F (36.8 C) 97.9 F (36.6 C)  SpO2: 100% 100%      S1-S2 present. Clear to auscultation. No acute complaint. Bowel sounds present.  Assessment and plan: Urinary incontinence  Resolved  UA unremarkable.  Hyperlipidemia  LDL dropped 149-49 and therefore will reduce Lipitor from 80 mg to 40 mg.  Author: Lynden Oxford, MD [facility] 03/22/2020 8:29 PM   If 7PM-7AM, please contact night-coverage at [email or web address]

## 2020-03-23 NOTE — Progress Notes (Signed)
[facility] PROGRESS NOTE  Patient: Kevin [name] [facility] BMW:[id number]   PCP: Medicine, [facility] DOB: 06-08-1965   DOA: 01/23/2020   DOS: 03/23/2020    Subjective: Continues to have intermittent incontinence issues.  No nausea no vomiting but no fever no chills.  Objective:  Vitals:   03/23/20 1432 03/23/20 2043  BP: 131/70 138/80  Pulse: 66 71  Resp: 18 16  Temp: 98.6 F (37 C) 98.6 F (37 C)  SpO2: 100% 100%    S1-S2 present. Clear to auscultation. Bowel sounds present.  Assessment and plan: Remains stable for discharge to SNF.  Author: Lynden Oxford, MD [facility] 03/23/2020 9:08 PM   If 7PM-7AM, please contact night-coverage at [email or web address]

## 2020-03-24 LAB — GLUCOSE, CAPILLARY: Glucose-Capillary: 120 mg/dL — ABNORMAL HIGH (ref 70–99)

## 2020-03-24 NOTE — Progress Notes (Signed)
[facility] PROGRESS NOTE  Kevin [name] [facility] FAO:[id number] DOB: 05-Sep-1965 DOA: 01/23/2020 PCP: Medicine, [facility]  Status: Remains inpatient appropriate because:Altered mental status and Unsafe d/c plan  Dispo:  Patient From:  HOME  Planned Disposition: Skilled Nursing Facility/SNF vs ALF w/ locked dementia unit-[facility] reviewing clinicals--clinicals have been sent to [facility] SNF  Medically stable for discharge: YesYES             Barriers to discharge: Patient has underlying dementia and is not safe to discharge home independently.  He does not meet requirements for SNF due to stable mobility but would benefit from ALF that has a locked dementia unit.  Given his mental status issues he would be at risk for medication nonadherence in context of his severe heart failure and hypertension issues which all contributed to his stroke.  He also has diabetes therefore in addition to needing to maintain an appropriate cardiac and fluid restricted diet he also needs to be on a carbohydrate restricted diet to keep diabetes under control to further minimize worsening of cardiovascular disease.             Difficult to place: Yes     Level of care: Med-Surg  Code Status: Full Family Communication: HIPAA appropriate voicemail left for patient's mother [name] on 3/1 DVT prophylaxis: Eliquis Vaccination status: Unknown  Foley catheter: Condom cath  HPI: 55 y.o. male past medical history of systolic heart failure with an EF of 35% stage IIIa, CVA diabetes mellitus presents secondary to progressive weakness and decline in the setting of recent stroke December 2021.  Patient was recently discharged from the hospital on 12/28 after admission for acute CVA.  He was discharged to home with home health PT/OT/RN and home health aide under the care of his elderly parents.  Patient has a history of noncompliance.  Patient to the hospital family reported generalized decline after being  discharged home.  He was supposed to have home health set up but did not have insurance and the home health agency that was working with him would not accept cash from the family.  The family reported decreased oral intake and patient wincing when swallowing solids although the patient denied pain with swallowing.  He has chronic residual right-sided weakness and cognitive deficits since his stroke.  Completed on date of admission revealed multiple infratentorial and supratentorial remote lacunar infarcts without any acute abnormality  Subjective: Alert, sitting up in chair.  Has completed eating breakfast.  No specific complaints.  Objective: Vitals:   03/23/20 2043 03/24/20 0453  BP: 138/80 (!) 131/103  Pulse: 71 (!) 59  Resp: 16 16  Temp: 98.6 F (37 C) (!) 97.5 F (36.4 C)  SpO2: 100% 100%    Intake/Output Summary (Last 24 hours) at 03/24/2020 6962 Last data filed at 03/23/2020 1330 Gross per 24 hour  Intake 300 ml  Output --  Net 300 ml   Filed Weights   03/20/20 0500 03/22/20 0500 03/24/20 0453  Weight: 72.8 kg 72.8 kg 72.5 kg    Exam:  Constitutional: No acute distress, pleasant ENT: Bilateral hordeolum have improved and have almost resolved Respiratory: Lungs are clear to auscultation, stable on room air Cardiac: Heart sounds are S1-S2, no peripheral edema, no JVD, extremities warm to touch Abdomen: Soft and nontender with normoactive bowel sounds.  Eating well.  LBM 3/9 Neurologic: CN 2-12 grossly intact. Sensation intact, DTR normal. Strength 5/5 x on left and slight weakness 4/5 on right. Psychiatric: Confused at times  no discharge this WE-wanted staff to find his clothes so her could "get ready for work" this morning on 3/14 patient was oriented to place.  He stated the year was 2021.  Again was unable to state the rationale for why he had to come into the hospital.   Assessment/Plan: Acute problems: Acute metabolic encephalopathy (resolved) in context of dementia  due to to other medical condition: Suspect secondary to chronic cognitive deficits in the setting of prior CVA as well as likely vascular dementia underlying 2/2 diabetes and hypertension.  MRI from December demonstrated moderate chronic small vessel ischemic disease with chronic lacunar infarcts. Acute encephalopathy has resolved. Is to have significant issues with short-term memory deficits leading to safety concerns.  He is unable to live alone.  In the hospital he has demonstrated wandering behaviors and therefore would best be suited to a locked ALF behavioral/dementia unit. 2/26: SLP cognitive evaluation as follows:  Pt was seen for a cognitive-linguistic evaluation.  Most recent evaluation was on 01/08/2020 with reported cognitive deficits "including reduced attention, reduced problem solving, reduced safety awareness, decreased executive function skills and disorientation."  Per chart review, pt has a history of short-term memory deficits and will wander (decreased safety awareness).  Pt reported that he lives with his parents and that they assist him with medications and finances.  Pt completed the [facility] SLUMS evaluation, assessing for Mild Neurocognitive Disorder and Dementia on this date.  Pt scored overall 5/30, which falls within the Dementia range (1-20).  Patient exhibited global cognitive deficits in orientation, short-term memory, attention, problem solving, safety judgement, and executive functioning.  Please see below for additional information.  Pt will benefit from 24/7 supervision and full assistance with IADLs (medications, finances, etc.) at time of discharge.  All further skilled ST needs can be met at the next venue of care.  Please re-consult if additional needs arise.   [place] Examination Orientation 1/3  Numeric Problem Solving  0/3  Memory  0/5  Attention 0/2  Thought Organization 1/3  Clock Drawing 0/4  Short Story Recall  2/8  Visuospatial Skills  1/2  Total  5/30  (Dementia Range)    Scoring  High School Education  Less than High School Education   Normal  27-30 25-30  Mild Neurocognitive Disorder 21-26 20-24  Dementia  1-20 1-19    2/25: OT cognitive evaluation Arousal/Alertness: Awake/alert Behavior During Therapy: Flat affect Overall Cognitive Status: Impaired/Different from baseline Area of Impairment: Awareness;Following commands;Attention;Problem solving;Memory;Safety/judgement Memory: Decreased short-term memory Following Commands: Follows one step commands with increased time;Follows one step commands consistently Safety/Judgement: Decreased awareness of safety;Decreased awareness of deficits Problem Solving: Slow processing;Requires verbal cues  Bilateral Hordeolum -Continue tobramycin eyedrops for 10 days (initiated on 3/3) note significant improvement -Continue warm compresses  Diabetes mellitus type 2 without hyperglycemia not on long-term insulin: With an A1c of 5.8 Diet controlled  Essential hypertension: Continue amlodipine, hydrochlorothiazide lisinopril  Chronic systolic heart failure: EF of 20% not on beta-blocker due to bradycardia. Continue lisinopril and hydrochlorothiazide  Follow intake and output  History of CVA Continue appropriate blood pressure control Continue Eliquis and Lipitor No chronic focal neurological deficits or gait disturbances noting he ambulates 300 feet with PT  Nutrition Status: Nutrition Problem: Increased nutrient needs Etiology: chronic illness (CHF) Signs/Symptoms: estimated needs Interventions: Magic cup,MVI Estimated body mass index is 21.68 kg/m as calculated from the following:   Height as of this encounter: 6' (1.829 m).   Weight as of this encounter: 72.5 kg.  Other problems: Acute kidney injury on chronic kidney C stage IIIa: Acute hypovolemia resolved.  Hypovolemic hypernatremia: Resolved with IV fluid hydration.    Data Reviewed: Basic Metabolic  Panel: Recent Labs  Lab 03/19/20 1111  NA 138  K 3.9  CL 98  CO2 33*  GLUCOSE 138*  BUN 20  CREATININE 1.37*  CALCIUM 9.4   Liver Function Tests: Recent Labs  Lab 03/19/20 1111  AST 20  ALT 25  ALKPHOS 58  BILITOT 0.6  PROT 6.7  ALBUMIN 3.7   No results for input(s): LIPASE, AMYLASE in the last 168 hours. No results for input(s): AMMONIA in the last 168 hours. CBC: Recent Labs  Lab 03/19/20 1111  WBC 3.4*  NEUTROABS 1.4*  HGB 10.4*  HCT 32.9*  MCV 93.7  PLT 104*   Cardiac Enzymes: No results for input(s): CKTOTAL, CKMB, CKMBINDEX, TROPONINI in the last 168 hours. BNP (last 3 results) Recent Labs    11/29/19 0037 01/04/20 1339 01/07/20 1549  BNP 1,278.7* 465.9* 46.1    ProBNP (last 3 results) No results for input(s): PROBNP in the last 8760 hours.  CBG: Recent Labs  Lab 03/19/20 0501 03/20/20 0500 03/21/20 0507 03/22/20 0448 03/24/20 0451  GLUCAP 114* 115* 119* 93 120*    No results found for this or any previous visit (from the past 240 hour(s)).   Studies: No results found.  Scheduled Meds: . amLODipine  10 mg Oral Daily  . apixaban  5 mg Oral BID  . atorvastatin  40 mg Oral Daily  . hydrochlorothiazide  25 mg Oral Daily  . lisinopril  40 mg Oral Daily  . magnesium oxide  400 mg Oral BID  . multivitamin with minerals  1 tablet Oral Daily  . 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 [facility]) Active Problems:   Diabetes mellitus without complication ([place])   History of CVA (cerebrovascular accident)   Essential hypertension   CKD (chronic kidney disease), stage III (HCC)   Hypernatremia   Chronic CHF (congestive heart failure) (HCC)   Acute hypernatremia   Hordeolum externum of right upper eyelid   Hordeolum externum of right lower eyelid   Hordeolum externum of left lower  eyelid   Consultants:  None  Procedures:  None  Antibiotics: Anti-infectives (From admission, onward)   None       Time spent: 20 minutes    Erin Hearing ANP  [facility] 7 am - 330 pm/M-F for direct patient care and secure chat Please refer to Amion for contact info 60  days

## 2020-03-24 NOTE — Hospital Course (Signed)
Acute metabolic encephalopathy (resolved) in context of dementia due to to other medical condition: Bilateral Hordeolum Diabetes mellitus type 2 without hyperglycemia not on long-term insulin: Essential hypertension: Chronic systolic heart failure: History of CVA Acute kidney injury on chronic kidney C stage IIIa: Acute hypovolemia resolved. Hypovolemic hypernatremia: Resolved with IV fluid hydration. HLD lipitor dose changed 80 to 40. Chronic Thrombocytopenia- plt <100 Urinary Incontinence checked UA.

## 2020-03-24 NOTE — TOC Progression Note (Signed)
Transition of Care [facility]) - Progression Note    Patient Details  Name: Kevin [name] MRN: [id number] Date of Birth: May 31, 1965  Transition of [facility]) CM/SW Contact  Curlene Labrum, RN Phone Number: 03/24/2020, 11:05 AM  Clinical Narrative:    Case management met with the patient at the bedside this morning concerning transitions of care to SNF once bed availability is confirmed.  The patient currently has no bed offers at this time.  The patient currently has Medicaid pending.  I called and left a message with Caren Griffins, CM at [facility] to inquire about possible bed availability at the facility and possible bed offer.   Expected Discharge Plan: Long Term Nursing Home Barriers to Discharge: SNF Pending bed offer,SNF Pending payor source - LOG,SNF Pending Medicaid  Expected Discharge Plan and Services Expected Discharge Plan: [place] In-house Referral: Clinical Social Work Discharge Planning Services: CM Consult Post Acute Care Choice: Nursing Home ([place]) Living arrangements for the past 2 months: [place]: OT,PT [place]: [place] Date [place]: 01/28/20 Time [place]: [id number] Representative spoke with at [place]: [place] ([place]) Interventions    Readmission Risk Interventions Readmission Risk Prevention Plan 03/18/2020 01/08/2020  Transportation Screening Complete Complete  PCP or Specialist Appt within 5-7 Days - Complete  Home Care Screening - Complete  Medication Review (RN CM) - Complete  Medication Review ([place]) Complete -  PCP or Specialist appointment within 3-5 days of discharge Complete -  [place] or Home Care Consult Complete -  Palliative Care Screening Complete -  [place] Complete -  Some recent data might be hidden

## 2020-03-25 LAB — GLUCOSE, CAPILLARY: Glucose-Capillary: 100 mg/dL — ABNORMAL HIGH (ref 70–99)

## 2020-03-25 MED ORDER — HYDROCHLOROTHIAZIDE 25 MG PO TABS: 25.0000 mg | ORAL_TABLET | Freq: Every day | ORAL | Status: AC

## 2020-03-25 MED ORDER — ATORVASTATIN CALCIUM 40 MG PO TABS: 40.0000 mg | ORAL_TABLET | Freq: Every day | ORAL | Status: DC

## 2020-03-25 MED ORDER — THIAMINE HCL 100 MG PO TABS: 100.0000 mg | ORAL_TABLET | Freq: Every day | ORAL | Status: AC

## 2020-03-25 MED ORDER — ACETAMINOPHEN 325 MG PO TABS
650.0000 mg | ORAL_TABLET | Freq: Four times a day (QID) | ORAL | Status: AC | PRN
Start: 2020-03-25 — End: ?

## 2020-03-25 MED ORDER — SENNOSIDES-DOCUSATE SODIUM 8.6-50 MG PO TABS: 1.0000 | ORAL_TABLET | Freq: Every evening | ORAL | Status: AC | PRN

## 2020-03-25 MED ORDER — ADULT MULTIVITAMIN W/MINERALS CH: 1.0000 | ORAL_TABLET | Freq: Every day | ORAL | Status: AC

## 2020-03-25 MED ORDER — LISINOPRIL 40 MG PO TABS: 40.0000 mg | ORAL_TABLET | Freq: Every day | ORAL | Status: AC

## 2020-03-25 MED ORDER — POLYETHYLENE GLYCOL 3350 17 G PO PACK: 17.0000 g | PACK | Freq: Every day | ORAL | 0 refills | Status: AC | PRN

## 2020-03-25 MED ORDER — MAGNESIUM OXIDE 400 (241.3 MG) MG PO TABS: 400.0000 mg | ORAL_TABLET | Freq: Two times a day (BID) | ORAL | Status: DC

## 2020-03-25 NOTE — Progress Notes (Signed)
Continue to crush meds and give with applesauce. Patient compliant and follows commands.

## 2020-03-25 NOTE — Discharge Summary (Incomplete)
Physician Discharge Summary  Kevin [name] [facility] WNI:[id number] DOB: [birth year] DOA: 01/23/2020  PCP: Medicine, [facility]  Admit date: 01/23/2020 Discharge date: 03/25/2020  Time spent: 48 minutes  Recommendations for Outpatient Follow-up:  1. Patient's primary cardiologist is Gwyndolyn Kaufman with [facility].  Please call their office to schedule outpatient appointment within 3 to 4 weeks after discharge.  He is followed by their office for chronic systolic heart failure 2. Kevin [name] will be discharging to [facility] skilled nursing facility 3. This patient is diet-controlled diabetes mellitus.  Recommend following CBGs initially with each meal and at bedtime and if demonstrate stability can decrease frequency of CBG checks at discretion of attending physician/physician extender.   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 [facility]) Active Problems:   Diabetes mellitus without complication ([place])   History of CVA (cerebrovascular accident)   Essential hypertension   CKD (chronic kidney disease), stage III (HCC)   Hypernatremia   Chronic CHF (congestive heart failure) (HCC)   Acute hypernatremia   Hordeolum externum of right upper eyelid   Hordeolum externum of right lower eyelid   Hordeolum externum of left lower eyelid    Discharge Condition: Stable  Diet recommendation: Carbohydrate modified  Filed Weights   03/22/20 0500 03/24/20 0453 03/25/20 0546  Weight: 72.8 kg 72.5 kg 71.7 kg    History of present illness:  55 y.o.malepast medical history of systolic heart failure with an EF of 35% stage IIIa, CVA diabetes mellitus presents secondary to progressive weakness and decline in the setting of recent stroke December 2021.  Patient was recently discharged from the hospital on 12/28 after admission for acute CVA.  He was discharged to home with home health PT/OT/RN and home health aide  under the care of his elderly parents.  Patient has a history of noncompliance.  Patient to the hospital family reported generalized decline after being discharged home.  He was supposed to have home health set up but did not have insurance and the home health agency that was working with him would not accept cash from the family.  The family reported decreased oral intake and patient wincing when swallowing solids although the patient denied pain with swallowing.  He has chronic residual right-sided weakness and cognitive deficits since his stroke.  Completed on date of admission revealed multiple infratentorial and supratentorial remote lacunar infarcts without any acute abnormality  Hospital Course:  Acute problems: Acute metabolic encephalopathy (resolved) in context of dementia due to to other medical condition: Suspect secondary to chronic cognitive deficits in the setting of prior CVA as well as likely vascular dementia underlying 2/2 diabetes and hypertension.  MRI from December demonstrated moderate chronic small vessel ischemic disease with chronic lacunar infarcts. Acute encephalopathy has resolved. Is to have significant issues with short-term memory deficits leading to safety concerns.  He is unable to live alone.  In the hospital he has demonstrated wandering behaviors and therefore would best be suited to a locked ALF behavioral/dementia unit. 2/26: SLP cognitive evaluation as follows:  Pt was seen for a cognitive-linguisticevaluation. Most recent evaluation was on 01/08/2020 with reported cognitive deficits "including reduced attention, reduced problem solving, reduced safety awareness, decreased executive function skills and disorientation." Per chart review, pt has a history of short-term memory deficits and will wander (decreased safety awareness). Pt reported that he lives with his parents and that they assist him with medications and finances. Pt completed the [facility]  SLUMSevaluation,assessingfor Mild NeurocognitiveDisorder and Dementia on this date. Pt scored overall 5/30, which falls within the Dementia range (1-20). Patient exhibited global cognitive deficits in orientation, short-term memory, attention, problem solving, safety judgement, and executive functioning. Please see below for additional information. Pt will benefit from 24/7 supervision and full assistance with IADLs (medications, finances, etc.) at time of discharge. All further skilled ST needs can be met at the next venue of care. Please re-consult if additional needs arise.   [place] Examination Orientation 1/3  Numeric Problem Solving  0/3  Memory  0/5  Attention 0/2  Thought Organization 1/3  Clock Drawing 0/4  Short Story Recall  2/8  Visuospatial Skills  1/2  Total 5/30(Dementia Range)    Scoring  High School Education  Less than High School Education   Normal  27-30 25-30  Mild Neurocognitive Disorder 21-26 20-24  Dementia  1-20 1-19    2/25: OT cognitive evaluation Arousal/Alertness: Awake/alert Behavior During Therapy: Flat affect Overall Cognitive Status: Impaired/Different from baseline Area of Impairment: Awareness;Following commands;Attention;Problem solving;Memory;Safety/judgement Memory: Decreased short-term memory Following Commands: Follows one step commands with increased time;Follows one step commands consistently Safety/Judgement: Decreased awareness of safety;Decreased awareness of deficits Problem Solving: Slow processing;Requires verbal cues  Bilateral Hordeolum -Continue tobramycin eyedrops for 10 days (initiated on 3/3) note significant improvement -Continue warm compresses  Diabetes mellitus type 2 without hyperglycemia not on long-term insulin: With an A1c of 5.8 Diet controlled  Essential hypertension: Continue amlodipine, hydrochlorothiazide lisinopril  Chronic systolic heart failure: EF of 20% not on beta-blocker due to  bradycardia. Continue lisinopriland hydrochlorothiazide  Follow intake and output  History of CVA Continue appropriate blood pressure control Continue Eliquis and Lipitor No chronic focal neurological deficits or gait disturbances noting he ambulates 300 feet with PT  Nutrition Status: Nutrition Problem: Increased nutrient needs Etiology: chronic illness (CHF) Signs/Symptoms: estimated needs Interventions: Magic cup,MVI Estimated body mass index is 21.44 kg/m as calculated from the following:   Height as of this encounter: 6' (1.829 m).   Weight as of this encounter: 71.7 kg.   Other problems: Acute kidney injury on chronic kidney C stage IIIa: Acute hypovolemia resolved.  Hypovolemic hypernatremia: Resolved with IV fluid hydration.     Procedures:  None  Consultations:  None  Discharge Exam: Vitals:   03/24/20 2217 03/25/20 0546  BP: 127/72 117/76  Pulse: 61 (!) 57  Resp: 18 18  Temp: 98.1 F (36.7 C) (!) 97.5 F (36.4 C)  SpO2: 100% 100%   Constitutional:  Awake, sitting up in bed eating, no complaints ENT: Bilateral hordeolum have improved and have almost resolved Respiratory:  Lungs are clear to auscultation and he remained stable on room air Cardiac:  Heart sounds are S1-S2, no rubs or S3, no peripheral edema or JVD, pulse regular Abdomen:  Soft nontender with normoactive bowel sounds.  Eats 100% of meals. LBM 3/13 Neurologic: CN 2-12 grossly intact. Sensation intact, DTR normal. Strength 5/5 x on left and slight weakness 4/5 on right. Psychiatric:  Alert and oriented times name.  Occasionally to place.  Sometimes to year.  Never to rationale for hospitalization.   Discharge Instructions    Allergies as of 03/25/2020   No Known Allergies     Medication List    STOP taking these medications   metFORMIN 1000 MG tablet Commonly known as: GLUCOPHAGE     TAKE these medications   acetaminophen 325 MG tablet Commonly known as:  TYLENOL Take 2 tablets (650 mg total) by mouth  every 6 (six) hours as needed for mild pain (or Fever >/= 101).   amLODipine 10 MG tablet Commonly known as: NORVASC Take 10 mg by mouth daily.   apixaban 5 MG Tabs tablet Commonly known as: Eliquis Take 1 tablet (5 mg total) by mouth 2 (two) times daily.   atorvastatin 40 MG tablet Commonly known as: LIPITOR Take 1 tablet (40 mg total) by mouth daily. Start taking on: March 26, 2020 What changed:   medication strength  how much to take   hydrochlorothiazide 25 MG tablet Commonly known as: HYDRODIURIL Take 1 tablet (25 mg total) by mouth daily. Start taking on: March 26, 2020 What changed: when to take this   lisinopril 40 MG tablet Commonly known as: ZESTRIL Take 1 tablet (40 mg total) by mouth daily. Start taking on: March 26, 2020 What changed:   medication strength  how much to take   magnesium oxide 400 (241.3 Mg) MG tablet Commonly known as: MAG-OX Take 1 tablet (400 mg total) by mouth 2 (two) times daily.   multivitamin with minerals Tabs tablet Take 1 tablet by mouth daily. Start taking on: March 26, 2020   polyethylene glycol 17 g packet Commonly known as: MIRALAX / GLYCOLAX Take 17 g by mouth daily as needed for mild constipation.   senna-docusate 8.6-50 MG tablet Commonly known as: Senokot-S Take 1 tablet by mouth at bedtime as needed for mild constipation.   thiamine 100 MG tablet Take 1 tablet (100 mg total) by mouth daily. Start taking on: March 26, 2020      No Known Allergies  Follow-up Information    [facility] Follow up.   Specialty: Home Health Services Contact information: [place] [address] [phone]                The results of significant diagnostics from this hospitalization (including imaging, microbiology, ancillary and laboratory) are listed below for reference.    Significant Diagnostic Studies: No results  found.  Microbiology: No results found for this or any previous visit (from the past 240 hour(s)).   Labs: Basic Metabolic Panel: Recent Labs  Lab 03/19/20 1111  NA 138  K 3.9  CL 98  CO2 33*  GLUCOSE 138*  BUN 20  CREATININE 1.37*  CALCIUM 9.4   Liver Function Tests: Recent Labs  Lab 03/19/20 1111  AST 20  ALT 25  ALKPHOS 58  BILITOT 0.6  PROT 6.7  ALBUMIN 3.7   No results for input(s): LIPASE, AMYLASE in the last 168 hours. No results for input(s): AMMONIA in the last 168 hours. CBC: Recent Labs  Lab 03/19/20 1111  WBC 3.4*  NEUTROABS 1.4*  HGB 10.4*  HCT 32.9*  MCV 93.7  PLT 104*   Cardiac Enzymes: No results for input(s): CKTOTAL, CKMB, CKMBINDEX, TROPONINI in the last 168 hours. BNP: BNP (last 3 results) Recent Labs    11/29/19 0037 01/04/20 1339 01/07/20 1549  BNP 1,278.7* 465.9* 46.1    ProBNP (last 3 results) No results for input(s): PROBNP in the last 8760 hours.  CBG: Recent Labs  Lab 03/20/20 0500 03/21/20 0507 03/22/20 0448 03/24/20 0451 03/25/20 0749  GLUCAP 115* 119* 93 120* 100*       Signed:  Erin Hearing ANP [facility] 03/25/2020, 1:44 PM

## 2020-03-25 NOTE — Progress Notes (Signed)
[facility] PROGRESS NOTE  Kevin [name] [facility] ZYS:[id number] DOB: 05-03-1965 DOA: 01/23/2020 PCP: [facility], [facility]  Status: Remains inpatient appropriate because:Altered mental status and Unsafe d/c plan  Dispo:  Patient From:  HOME  Planned Disposition: Skilled Nursing Facility/SNF vs ALF w/ locked dementia unit-[facility] reviewing clinicals--clinicals have been sent to [facility] SNF  Medically stable for discharge: YesYES             Barriers to discharge: Patient has underlying dementia and is not safe to discharge home independently.  He does not meet requirements for SNF due to stable mobility but would benefit from ALF that has a locked dementia unit.  Given his mental status issues he would be at risk for medication nonadherence in context of his severe heart failure and hypertension issues which all contributed to his stroke.  He also has diabetes therefore in addition to needing to maintain an appropriate cardiac and fluid restricted diet he also needs to be on a carbohydrate restricted diet to keep diabetes under control to further minimize worsening of cardiovascular disease.             Difficult to place: Yes     Level of care: Med-Surg  Code Status: Full Family Communication: HIPAA appropriate voicemail left for patient's mother [name] on 9/32 regarding upcoming discharge on 3/16 to [facility] skilled nursing facility. DVT prophylaxis: Eliquis Vaccination status: Unknown  Foley catheter: Condom cath  HPI: 55 y.o. male past medical history of systolic heart failure with an EF of 35% stage IIIa, CVA diabetes mellitus presents secondary to progressive weakness and decline in the setting of recent stroke December 2021.  Patient was recently discharged from the hospital on 12/28 after admission for acute CVA.  He was discharged to home with home health PT/OT/RN and home health aide under the care of his elderly parents.  Patient has a history of noncompliance.   Patient to the hospital family reported generalized decline after being discharged home.  He was supposed to have home health set up but did not have insurance and the home health agency that was working with him would not accept cash from the family.  The family reported decreased oral intake and patient wincing when swallowing solids although the patient denied pain with swallowing.  He has chronic residual right-sided weakness and cognitive deficits since his stroke.  Completed on date of admission revealed multiple infratentorial and supratentorial remote lacunar infarcts without any acute abnormality  Subjective: Sitting on side of bed eating breakfast.  When asked him if it was good he shrugs his shoulders and said "I guess so".  Objective: Vitals:   03/24/20 2217 03/25/20 0546  BP: 127/72 117/76  Pulse: 61 (!) 57  Resp: 18 18  Temp: 98.1 F (36.7 C) (!) 97.5 F (36.4 C)  SpO2: 100% 100%    Intake/Output Summary (Last 24 hours) at 03/25/2020 0844 Last data filed at 03/24/2020 1800 Gross per 24 hour  Intake 960 ml  Output --  Net 960 ml   Filed Weights   03/22/20 0500 03/24/20 0453 03/25/20 0546  Weight: 72.8 kg 72.5 kg 71.7 kg    Exam:  Constitutional: Alert, pleasant, no acute distress ENT: Bilateral hordeolum have improved and have almost resolved Respiratory: Lungs are clear to auscultation and stable on room air Cardiac: Heart sounds are normal with no peripheral edema no JVD Abdomen: Soft nontender nondistended and typically eats 100% of meals.  LBM 3/13 Neurologic: CN 2-12 grossly intact. Sensation  intact, DTR normal. Strength 5/5 x on left and slight weakness 4/5 on right. Psychiatric: Alert, oriented to name only not to place or year.   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  MRI from December demonstrated moderate chronic small vessel ischemic disease with chronic lacunar infarcts. Acute encephalopathy has resolved. Is to have significant issues with short-term memory deficits leading to safety concerns.  He is unable to live alone.  In the hospital he has demonstrated wandering behaviors and therefore would best be suited to a locked ALF behavioral/dementia unit. 2/26: SLP cognitive evaluation as follows:  Pt was seen for a cognitive-linguistic evaluation.  Most recent evaluation was on 01/08/2020 with reported cognitive deficits "including reduced attention, reduced problem solving, reduced safety awareness, decreased executive function skills and disorientation."  Per chart review, pt has a history of short-term memory deficits and will wander (decreased safety awareness).  Pt reported that he lives with his parents and that they assist him with medications and finances.  Pt completed the [facility] SLUMS evaluation, assessing for Mild Neurocognitive Disorder and Dementia on this date.  Pt scored overall 5/30, which falls within the Dementia range (1-20).  Patient exhibited global cognitive deficits in orientation, short-term memory, attention, problem solving, safety judgement, and executive functioning.  Please see below for additional information.  Pt will benefit from 24/7 supervision and full assistance with IADLs (medications, finances, etc.) at time of discharge.  All further skilled ST needs can be met at the next venue of care.  Please re-consult if additional needs arise.   Allen Examination Orientation 1/3  Numeric Problem Solving  0/3  Memory  0/5  Attention 0/2  Thought Organization 1/3  Clock Drawing 0/4  Short Story Recall  2/8  Visuospatial Skills  1/2  Total  5/30 (Dementia Range)    Scoring  High School Education  Less than High School Education   Normal  27-30 25-30  Mild  Neurocognitive Disorder 21-26 20-24  Dementia  1-20 1-19    2/25: OT cognitive evaluation Arousal/Alertness: Awake/alert Behavior During Therapy: Flat affect Overall Cognitive Status: Impaired/Different from baseline Area of Impairment: Awareness;Following commands;Attention;Problem solving;Memory;Safety/judgement Memory: Decreased short-term memory Following Commands: Follows one step commands with increased time;Follows one step commands consistently Safety/Judgement: Decreased awareness of safety;Decreased awareness of deficits Problem Solving: Slow processing;Requires verbal cues  Bilateral Hordeolum -Continue tobramycin eyedrops for 10 days (initiated on 3/3) note significant improvement -Continue warm compresses  Diabetes mellitus type 2 without hyperglycemia not on long-term insulin: With an A1c of 5.8 Diet controlled  Essential hypertension: Continue amlodipine, hydrochlorothiazide lisinopril  Chronic systolic heart failure: EF of 20% not on beta-blocker due to bradycardia. Continue lisinopril and hydrochlorothiazide  Follow intake and output  History of CVA Continue appropriate blood pressure control Continue Eliquis and Lipitor No chronic focal neurological deficits or gait disturbances noting he ambulates 300 feet with PT  Nutrition Status: Nutrition Problem: Increased nutrient needs Etiology: chronic illness (CHF) Signs/Symptoms: estimated needs Interventions: Magic cup,MVI Estimated body mass index is 21.44 kg/m as calculated from the following:   Height as of this encounter: 6' (1.829 m).   Weight as of this encounter: 71.7 kg.   Other problems: Acute kidney injury on chronic kidney C stage IIIa: Acute hypovolemia resolved.  Hypovolemic hypernatremia: Resolved with IV fluid hydration.  Data Reviewed: Basic Metabolic Panel: Recent Labs  Lab 03/19/20 1111  NA 138  K 3.9  CL 98  CO2 33*  GLUCOSE 138*  BUN 20  CREATININE 1.37*  CALCIUM  9.4   Liver Function Tests: Recent Labs  Lab 03/19/20 1111  AST 20  ALT 25  ALKPHOS 58  BILITOT 0.6  PROT 6.7  ALBUMIN 3.7   No results for input(s): LIPASE, AMYLASE in the last 168 hours. No results for input(s): AMMONIA in the last 168 hours. CBC: Recent Labs  Lab 03/19/20 1111  WBC 3.4*  NEUTROABS 1.4*  HGB 10.4*  HCT 32.9*  MCV 93.7  PLT 104*   Cardiac Enzymes: No results for input(s): CKTOTAL, CKMB, CKMBINDEX, TROPONINI in the last 168 hours. BNP (last 3 results) Recent Labs    11/29/19 0037 01/04/20 1339 01/07/20 1549  BNP 1,278.7* 465.9* 46.1    ProBNP (last 3 results) No results for input(s): PROBNP in the last 8760 hours.  CBG: Recent Labs  Lab 03/20/20 0500 03/21/20 0507 03/22/20 0448 03/24/20 0451 03/25/20 0749  GLUCAP 115* 119* 93 120* 100*    No results found for this or any previous visit (from the past 240 hour(s)).   Studies: No results found.  Scheduled Meds: . amLODipine  10 mg Oral Daily  . apixaban  5 mg Oral BID  . atorvastatin  40 mg Oral Daily  . hydrochlorothiazide  25 mg Oral Daily  . lisinopril  40 mg Oral Daily  . magnesium oxide  400 mg Oral BID  . multivitamin with minerals  1 tablet Oral Daily  . 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 [facility]) Active Problems:   Diabetes mellitus without complication ([place])   History of CVA (cerebrovascular accident)   Essential hypertension   CKD (chronic kidney disease), stage III (HCC)   Hypernatremia   Chronic CHF (congestive heart failure) (HCC)   Acute hypernatremia   Hordeolum externum of right upper eyelid   Hordeolum externum of right lower eyelid   Hordeolum externum of left lower eyelid   Consultants:  None  Procedures:  None  Antibiotics: Anti-infectives (From admission, onward)   None       Time spent: 20 minutes    Erin Hearing ANP  [facility] 7 am  - 330 pm/M-F for direct patient care and secure chat Please refer to Amion for contact info 61  days

## 2020-03-25 NOTE — TOC Progression Note (Addendum)
Transition of Care [facility]) - Progression Note    Patient Details  Name: Kevin [name] MRN: [id number] Date of Birth: December 22, 1965  Transition of Care [facility]) CM/SW Contact  Janae Bridgeman, RN Phone Number: 03/25/2020, 1:00 PM  Clinical Narrative:    Case management spoke with Lance Muss, CM at [facility] and the facility is willing to accept the patient tomorrow, 03/26/2020.  The patient will receive COVID screen today prior to admission to the facility.  The patient will be placed at [facility] for admission with support of [place] LOG since the patient's Medicaid is pending.  The patient will be in a locked unit at the facility due to his dementia.  The patient will be transferring to Room 707A.  03/25/2020  Edwin Dada, MSW reached out to [facility] [place] to complete LOG to be sent to the facility.  CM and MSW will follow up with the family for an update and patient to be transferred to the facility tomorrow via PTAR.   Expected Discharge Plan: Long Term Nursing Home Barriers to Discharge: SNF Pending bed offer,SNF Pending payor source - LOG,SNF Pending Medicaid  Expected Discharge Plan and Services Expected Discharge Plan: Long Term Nursing Home In-house Referral: Clinical Social Work Discharge Planning Services: CM Consult Post Acute Care Choice: Nursing Home (LTC Nursing Home) Living arrangements for the past 2 months: Single Family Home                           HH Arranged: OT,PT HH Agency: [facility] Date [facility] Agency Contacted: 01/28/20 Time HH Agency Contacted: 1425 Representative spoke with at [facility] Agency: Kandee Keen   Social Determinants of Health (SDOH) Interventions    Readmission Risk Interventions Readmission Risk Prevention Plan 03/18/2020 01/08/2020  Transportation Screening Complete Complete  PCP or Specialist Appt within 5-7 Days - Complete  Home Care Screening - Complete  Medication Review (RN CM) - Complete  Medication Review (RN Care  Manager) Complete -  PCP or Specialist appointment within 3-5 days of discharge Complete -  HRI or Home Care Consult Complete -  Palliative Care Screening Complete -  Skilled Nursing Facility Complete -  Some recent data might be hidden

## 2020-03-25 NOTE — Discharge Summary (Signed)
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 PCP: Follow-up with PCP in 1 week. 2. Follow-up with cardiology in 3 to 4 weeks 3. Referral to hematology for chronic thrombocytopenia 4. Follow up LABS/TEST: None   Follow-up Information    [facility] Follow up.   Specialty: Home Health Services Contact information: [address] [place] [address] [phone]        Medicine, [facility]. Schedule an appointment as soon as possible for a visit in 1 week(s).   Specialty: Family Medicine Contact information: [address] [place] [address] [phone]              Discharge Instructions    Diet general   Complete by: As directed    Increase activity slowly   Complete by: As directed      Diet recommendation: Regular diet  Activity: The patient is advised to gradually reintroduce usual activities, as tolerated  Discharge Condition: stable  Code Status: Full code   History of present illness: As per the H and P dictated on admission, "Kevin [name] [place] is a 55 y.o. male with medical history significant of systolic and diastolic heart failure, CKD, CVA, diabetes,  hypertension, left eye blindness who presents after generalized decline at home.             Patient family reports he has had generalized decline at home since his discharge following a CVA in December.  He was supposed to be set up with home health agency however he does not have insurance and the home health agency he was working with would not accept cash from the family.  He has had decreased p.o. intake family reports wincing when swallowing solids however he denies any pain with swallowing.  They have also noted a generalized decreased p.o. intake.  He has residual right-sided weakness and cognitive deficits following his stroke in December. Denies fever, cough, chills, chest pain, shortness of breath, abdominal pain, nausea, vomiting, constipation, diarrhea.  ED Course: Vital signs in ED significant for blood pressure in the 120s to 150s systolic.  Lab work-up showed BMP with sodium 152, creatinine 2.44 from baseline 1.5, gap 18, glucose 137.  Calcium 10.5.  CBC showed hemoglobin 18 and platelets 144.  Respiratory and for flu and COVID pending.  Patient started on IV fluids in the ED."  Hospital Course:  Summary of his active problems in the hospital is as following.  Hypovolemic hypernatremia- initial sodium of 152  likely secondary to poor oral intake and dysphagia Patient received IV fluids with improvement in sodium level Currently sodium level remains normal without any assistance.  Acute metabolic encephalopathy Secondary to hypernatremia. Secondary work-up for dementia is unremarkable including, B12,  folic acid, TSH, ammonia.  History of CVA December 2021, Hyperlipidemia Was discharged with home health though was unable to pay for these services and agency wouldn't accept cash.  Had residual right-sided weakness and dysphagia which improved during the hospital stay. Continue amlodipine and lisinopril, HCTZ. Eliquis (due to low LVEF) His LDL was 149 in December 2021.  LDL  currently is 49. We will reduce Lipitor dose from 80 mg daily to 40 mg daily.  Chronic combined systolic and diastolic CHF: Appears euvolemic. Not on diuretics, Last TEE 01/07/2020 with LVEF 20-25%.  Dementia.  Without behavioral issues. 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 MRI from December demonstrated moderate chronic small vessel ischemic disease with chronic lacunar infarcts. Acute encephalopathy has resolved. Likely will benefit from initiation of Aricept outpatient. No behavioral issues. He is unable to live alone. In the hospital he has demonstrated wandering behaviors and therefore would best be suited to a locked ALF behavioral/dementia unit.  Diabetes mellitus type 2 without hyperglycemia not on long-term insulin: With an A1c of 5.8 Diet controlled  Bilateral Hordeolum Treated tobramycin eyedrops for 10 days   Dysphagia seen by speech therapy,  Improved significantly now on regular diet.  Hypokalemia Resolved.  Hypomagnesemia.   Improved  Acute kidney injury on CKD stage IIIb- at baseline at this time.   Chronic thrombocytopenia. Throughout the hospitalization platelets are fluctuated between 66 and 150 last platelet count is 104. No evidence of active bleeding. Work-up unremarkable for any hemolytic abnormality or nutritional deficiency. Outpatient referral to hematology for further work-up.  Intermittent urinary incontinence. Resolved.  Monitor.  Body mass index is 21.44 kg/m.  Nutrition Problem: Increased nutrient needs Etiology: chronic illness (CHF) Nutrition Interventions: Interventions: Magic cup,MVI  On the day of the discharge the patient's vitals were stable, and no other acute medical condition were reported by patient. The patient was felt safe to be discharge at memory care unit.  Consultants: none Procedures: none  DISCHARGE  MEDICATION: Allergies as of 03/25/2020   No Known Allergies     Medication List    STOP taking these medications   metFORMIN 1000 MG tablet Commonly known as: GLUCOPHAGE     TAKE these medications   acetaminophen 325 MG tablet Commonly known as: TYLENOL Take 2 tablets (650 mg total) by mouth every 6 (six) hours as needed for mild pain (or Fever >/= 101).   amLODipine 10 MG tablet Commonly known as: NORVASC Take 10 mg by mouth daily.   apixaban 5 MG Tabs tablet Commonly known as: Eliquis Take 1 tablet (5 mg total) by mouth 2 (two) times daily.   atorvastatin 40 MG tablet Commonly known as: LIPITOR Take 1 tablet (40 mg total) by mouth daily. Start taking on: March 26, 2020 What changed:   medication strength  how much to take   hydrochlorothiazide 25 MG tablet Commonly known as: HYDRODIURIL Take 1 tablet (25 mg total) by mouth daily. Start taking on: March 26, 2020 What changed: when to take this   lisinopril 40 MG tablet Commonly known as: ZESTRIL Take 1 tablet (40 mg total) by mouth daily. Start taking on: March 26, 2020 What changed:   medication strength  how much to take   magnesium oxide 400 (241.3 Mg) MG tablet Commonly known as: MAG-OX Take 1 tablet (400 mg total) by mouth 2 (two) times daily.   multivitamin with minerals Tabs tablet Take 1 tablet by mouth daily.  Start taking on: March 26, 2020   polyethylene glycol 17 g packet Commonly known as: MIRALAX / GLYCOLAX Take 17 g by mouth daily as needed for mild constipation.   senna-docusate 8.6-50 MG tablet Commonly known as: Senokot-S Take 1 tablet by mouth at bedtime as needed for mild constipation.   thiamine 100 MG tablet Take 1 tablet (100 mg total) by mouth daily. Start taking on: March 26, 2020       Discharge Exam: [place] [place] Weights   03/22/20 0500 03/24/20 0453 03/25/20 0546  Weight: 72.8 kg 72.5 kg 71.7 kg   Vitals:   03/25/20 1438 03/25/20 2046  BP: 115/78 126/77  Pulse: 76  65  Resp: 16 19  Temp: 98 F (36.7 C) 98.7 F (37.1 C)  SpO2: 100% 100%   General: Appear in no distress, no Rash; Oral Mucosa Clear, moist. no Abnormal Neck Mass Or lumps, Conjunctiva normal  Cardiovascular: S1 and S2 Present, no Murmur Respiratory: good respiratory effort, Bilateral Air entry present and CTA, no Crackles, no wheezes Abdomen: Bowel Sound present, Soft and no tenderness Extremities: no Pedal edema Neurology: alert and oriented to person affect appropriate. no new focal deficit  The results of significant diagnostics from this hospitalization (including imaging, microbiology, ancillary and laboratory) are listed below for reference.    Significant Diagnostic Studies: No results found.  Microbiology: No results found for this or any previous visit (from the past 240 hour(s)).   Labs: CBC: Recent Labs  Lab 03/19/20 1111  WBC 3.4*  NEUTROABS 1.4*  HGB 10.4*  HCT 32.9*  MCV 93.7  PLT 104*   Basic Metabolic Panel: Recent Labs  Lab 03/19/20 1111  NA 138  K 3.9  CL 98  CO2 33*  GLUCOSE 138*  BUN 20  CREATININE 1.37*  CALCIUM 9.4   Liver Function Tests: Recent Labs  Lab 03/19/20 1111  AST 20  ALT 25  ALKPHOS 58  BILITOT 0.6  PROT 6.7  ALBUMIN 3.7   CBG: Recent Labs  Lab 03/20/20 0500 03/21/20 0507 03/22/20 0448 03/24/20 0451 03/25/20 0749  GLUCAP 115* 119* 93 120* 100*    Time spent: 35 minutes  Signed:  Lynden Oxford  [facility]  03/25/2020 9:08 PM

## 2020-03-26 LAB — CBC WITH DIFFERENTIAL/PLATELET
Abs Immature Granulocytes: 0.01 10*3/uL (ref 0.00–0.07)
Basophils Absolute: 0 10*3/uL (ref 0.0–0.1)
Basophils Relative: 1 %
Eosinophils Absolute: 0.1 10*3/uL (ref 0.0–0.5)
Eosinophils Relative: 3 %
HCT: 34.7 % — ABNORMAL LOW (ref 39.0–52.0)
Hemoglobin: 10.8 g/dL — ABNORMAL LOW (ref 13.0–17.0)
Immature Granulocytes: 0 %
Lymphocytes Relative: 44 %
Lymphs Abs: 1.8 10*3/uL (ref 0.7–4.0)
MCH: 29.5 pg (ref 26.0–34.0)
MCHC: 31.1 g/dL (ref 30.0–36.0)
MCV: 94.8 fL (ref 80.0–100.0)
Monocytes Absolute: 0.3 10*3/uL (ref 0.1–1.0)
Monocytes Relative: 8 %
Neutro Abs: 1.8 10*3/uL (ref 1.7–7.7)
Neutrophils Relative %: 44 %
Platelets: 123 10*3/uL — ABNORMAL LOW (ref 150–400)
RBC: 3.66 MIL/uL — ABNORMAL LOW (ref 4.22–5.81)
RDW: 13.5 % (ref 11.5–15.5)
WBC: 4.1 10*3/uL (ref 4.0–10.5)
nRBC: 0 % (ref 0.0–0.2)

## 2020-03-26 LAB — BASIC METABOLIC PANEL
Anion gap: 7 (ref 5–15)
BUN: 22 mg/dL — ABNORMAL HIGH (ref 6–20)
CO2: 33 mmol/L — ABNORMAL HIGH (ref 22–32)
Calcium: 9.5 mg/dL (ref 8.9–10.3)
Chloride: 100 mmol/L (ref 98–111)
Creatinine, Ser: 1.38 mg/dL — ABNORMAL HIGH (ref 0.61–1.24)
GFR, Estimated: >60 mL/min (ref >60)
Glucose, Bld: 126 mg/dL — ABNORMAL HIGH (ref 70–99)
Potassium: 3.7 mmol/L (ref 3.5–5.1)
Sodium: 140 mmol/L (ref 135–145)

## 2020-03-26 LAB — GLUCOSE, CAPILLARY: Glucose-Capillary: 112 mg/dL — ABNORMAL HIGH (ref 70–99)

## 2020-03-26 LAB — SARS CORONAVIRUS 2 (TAT 6-24 HRS): SARS Coronavirus 2: NEGATIVE

## 2020-03-26 NOTE — Progress Notes (Signed)
CSW spoke with [place] of financial counseling - patient's Medicaid worker at [employer] is [name] at [phone].  Edwin Dada, MSW, LCSW Transitions of Care  Clinical Social Worker II [phone]

## 2020-03-26 NOTE — TOC Transition Note (Signed)
Transition of Care [facility]) - CM/SW Discharge Note   Patient Details  Name: Kevin [name] MRN: [id number] Date of Birth: Oct 15, 1965  Transition of Care [facility]) CM/SW Contact:  Janae Bridgeman, RN Phone Number: 03/26/2020, 9:35 AM   Clinical Narrative:    Case management called and spoke with Blanchie Dessert, CM with [facility] this morning and the facility is willing to admit the patient this morning under a LOG from [facility].  I called and spoke with Turner Daniels, business administrator at [facility] and Gretta Cool, [facility] supervisor will call the facility to arrange LOG payment and answer any questions related to this matter.    I called and spoke with the patient's father, [name] on the phone to notify him of the patient's transfer to [facility] facility this morning via ambulance.  Edwin Dada, CM spoke with the patient's mother at the bedside to update her and give her the address of the facility so that she could sign the patient in for admission.  CM called and spoke with [facility] transport to arrange ambulance transport this morning around 1030.  [facility] should arrive to pick the patient up for transport around 1030 this morning.  Nursing can call report to the facility  - [facility] at [phone].  Discharge orders, discharge summary and clinicals were all placed in the hub for the facility and copies placed in the [facility] packet at the bedside.   Final next level of care: Long Term Nursing Home Barriers to Discharge: SNF Pending bed offer,SNF Pending payor source - LOG,SNF Pending Medicaid   Patient Goals and CMS Choice Patient states their goals for this hospitalization and ongoing recovery are:: Patient's family, mother, wants patient placed in long term care nursing home for care. CMS Medicare.gov Compare Post Acute Care list provided to:: Patient Represenative (must comment) (Patient's mother, [name]) Choice offered to / list presented to :  Parent  Discharge Placement                       Discharge Plan and Services In-house Referral: Clinical Social Work Discharge Planning Services: CM Consult Post Acute Care Choice: Nursing Home (LTC Nursing Home)                    HH Arranged: OT,PT HH Agency: [facility] Date [facility] Agency Contacted: 01/28/20 Time HH Agency Contacted: 1425 Representative spoke with at [facility] Agency: Kandee Keen  Social Determinants of Health (SDOH) Interventions     Readmission Risk Interventions Readmission Risk Prevention Plan 03/18/2020 01/08/2020  Transportation Screening Complete Complete  PCP or Specialist Appt within 5-7 Days - Complete  Home Care Screening - Complete  Medication Review (RN CM) - Complete  Medication Review (RN Care Manager) Complete -  PCP or Specialist appointment within 3-5 days of discharge Complete -  HRI or Home Care Consult Complete -  Palliative Care Screening Complete -  Skilled Nursing Facility Complete -  Some recent data might be hidden

## 2020-03-26 NOTE — Progress Notes (Signed)
No interval changes, patient is to discharge to SNF today, please see Dr Eliane Decree discharge orders and summary done yesterday on 3/15.

## 2020-03-29 LAB — VITAMIN B1: Vitamin B1 (Thiamine): 99.6 nmol/L (ref 66.5–200.0)

## 2020-08-01 ENCOUNTER — Other Ambulatory Visit: Payer: Self-pay

## 2020-08-01 ENCOUNTER — Ambulatory Visit (INDEPENDENT_AMBULATORY_CARE_PROVIDER_SITE_OTHER): Payer: Medicaid Other | Admitting: Podiatry

## 2020-08-01 ENCOUNTER — Encounter: Payer: Self-pay | Admitting: Podiatry

## 2020-08-01 VITALS — BP 181/97 | HR 49 | Temp 97.1°F

## 2020-08-01 DIAGNOSIS — L853 Xerosis cutis: Secondary | ICD-10-CM

## 2020-08-01 DIAGNOSIS — E119 Type 2 diabetes mellitus without complications: Secondary | ICD-10-CM

## 2020-08-01 DIAGNOSIS — M79675 Pain in left toe(s): Secondary | ICD-10-CM

## 2020-08-01 DIAGNOSIS — B351 Tinea unguium: Secondary | ICD-10-CM

## 2020-08-01 DIAGNOSIS — M79674 Pain in right toe(s): Secondary | ICD-10-CM | POA: Diagnosis not present

## 2020-08-01 NOTE — Patient Instructions (Addendum)
To Staff at [facility]:  Please fax list of medications/drug allergies to our office at [phone].  Please apply AquaPhor Ointment to both feet once daily for dry skin. Avoid application between toes.  Professionally yours,  Dr. Geralynn Rile  Diabetes Mellitus and Foot Care Foot care is an important part of your health, especially when you have diabetes. Diabetes may cause you to have problems because of poor blood flow (circulation) to your feet and legs, which can cause your skin to: Become thinner and drier. Break more easily. Heal more slowly. Peel and crack. You may also have nerve damage (neuropathy) in your legs and feet, causing decreased feeling in them. This means that you may not notice minor injuries to your feet that could lead to more serious problems. Noticing and addressing any potential problems early is the best wayto prevent future foot problems. How to care for your feet Foot hygiene  Wash your feet daily with warm water and mild soap. Do not use hot water. Then, pat your feet and the areas between your toes until they are completely dry. Do not soak your feet as this can dry your skin. Trim your toenails straight across. Do not dig under them or around the cuticle. File the edges of your nails with an emery board or nail file. Apply a moisturizing lotion or petroleum jelly to the skin on your feet and to dry, brittle toenails. Use lotion that does not contain alcohol and is unscented. Do not apply lotion between your toes.  Shoes and socks Wear clean socks or stockings every day. Make sure they are not too tight. Do not wear knee-high stockings since they may decrease blood flow to your legs. Wear shoes that fit properly and have enough cushioning. Always look in your shoes before you put them on to be sure there are no objects inside. To break in new shoes, wear them for just a few hours a day. This prevents injuries on your feet. Wounds,  scrapes, corns, and calluses  Check your feet daily for blisters, cuts, bruises, sores, and redness. If you cannot see the bottom of your feet, use a mirror or ask someone for help. Do not cut corns or calluses or try to remove them with medicine. If you find a minor scrape, cut, or break in the skin on your feet, keep it and the skin around it clean and dry. You may clean these areas with mild soap and water. Do not clean the area with peroxide, alcohol, or iodine. If you have a wound, scrape, corn, or callus on your foot, look at it several times a day to make sure it is healing and not infected. Check for: Redness, swelling, or pain. Fluid or blood. Warmth. Pus or a bad smell.  General tips Do not cross your legs. This may decrease blood flow to your feet. Do not use heating pads or hot water bottles on your feet. They may burn your skin. If you have lost feeling in your feet or legs, you may not know this is happening until it is too late. Protect your feet from hot and cold by wearing shoes, such as at the beach or on hot pavement. Schedule a complete foot exam at least once a year (annually) or more often if you have foot problems. Report any cuts, sores, or bruises to your health care provider immediately. Where to find more information American Diabetes Association: www.diabetes.org Association of Diabetes Care & Education Specialists:  www.diabeteseducator.org Contact a health care provider if: You have a medical condition that increases your risk of infection and you have any cuts, sores, or bruises on your feet. You have an injury that is not healing. You have redness on your legs or feet. You feel burning or tingling in your legs or feet. You have pain or cramps in your legs and feet. Your legs or feet are numb. Your feet always feel cold. You have pain around any toenails. Get help right away if: You have a wound, scrape, corn, or callus on your foot and: You have pain,  swelling, or redness that gets worse. You have fluid or blood coming from the wound, scrape, corn, or callus. Your wound, scrape, corn, or callus feels warm to the touch. You have pus or a bad smell coming from the wound, scrape, corn, or callus. You have a fever. You have a red line going up your leg. Summary Check your feet every day for blisters, cuts, bruises, sores, and redness. Apply a moisturizing lotion or petroleum jelly to the skin on your feet and to dry, brittle toenails. Wear shoes that fit properly and have enough cushioning. If you have foot problems, report any cuts, sores, or bruises to your health care provider immediately. Schedule a complete foot exam at least once a year (annually) or more often if you have foot problems. This information is not intended to replace advice given to you by your health care provider. Make sure you discuss any questions you have with your healthcare provider. Document Revised: 07/19/2019 Document Reviewed: 07/19/2019 Elsevier Patient Education  2022 [employer].

## 2020-08-03 NOTE — Progress Notes (Signed)
Subjective: Kevin [name] presents today referred by Medicine, [facility] for diabetic foot evaluation.  He is a resident of [facility] after a stroke. His mother is present during today's visit.  Patient relates 5 year history of diabetes.  Patient denies any history of foot wounds.  Patient denies any symptoms of numbness in feet.  Patient denies any symptoms of tingling in feet.  Patient denies symptoms of burning in feet.  Patient denies symptoms of pins/needles sensations in feet.  Patient had blood glucose checked at facility, but he does not know what the reading was.  PCP is [facility] , and last visit was last Friday, July 25, 2020.  Today, patient c/o of painful, discolored, thick toenails which interfere with daily activities.  Pain is aggravated when wearing enclosed shoe gear.   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) 03/07/2020   Acute hypernatremia 01/24/2020   Hypernatremia 01/23/2020   Chronic CHF (congestive heart failure) (HCC) 01/23/2020   CKD (chronic kidney disease), stage III (HCC) 01/08/2020   Essential hypertension 01/05/2020   Acute on chronic combined systolic and diastolic CHF (congestive heart failure) (HCC) 01/05/2020   History of noncompliance with medical treatment 01/05/2020   CVA (cerebral vascular accident) (HCC) 01/04/2020   History of CVA (cerebrovascular accident) 01/04/2020   Hypertensive emergency 11/29/2019   Tobacco dependence 11/29/2019   Marijuana abuse  11/29/2019   Renal dysfunction 11/29/2019   Hypokalemia 11/29/2019   Diabetes mellitus without complication (HCC)    Elevated blood pressure 04/14/2013    Past Surgical History:  Procedure Laterality Date   BUBBLE STUDY  01/07/2020   Procedure: BUBBLE STUDY;  Surgeon: Jake Bathe, MD;  Location: [facility] ENDOSCOPY;  Service: Cardiovascular;;   GYNECOMASTIA EXCISION     TEE WITHOUT CARDIOVERSION N/A 01/07/2020   Procedure: TRANSESOPHAGEAL ECHOCARDIOGRAM (TEE);  Surgeon: Jake Bathe, MD;  Location: [facility] ENDOSCOPY;  Service: Cardiovascular;  Laterality: N/A;    Current Outpatient Medications on File Prior to Visit  Medication Sig Dispense Refill   acetaminophen (TYLENOL) 325 MG tablet Take 2 tablets (650 mg total) by mouth every 6 (six) hours as needed for mild pain (or Fever >/= 101).     amLODipine (NORVASC) 10 MG tablet Take 10 mg by mouth daily.     apixaban (ELIQUIS) 5 MG TABS tablet Take 1 tablet (5 mg total) by mouth 2 (two) times daily. 60 tablet 1   atorvastatin (LIPITOR) 40 MG tablet Take 1 tablet (40 mg total) by mouth daily.     hydrochlorothiazide (HYDRODIURIL) 25 MG tablet Take 1 tablet (25 mg total) by mouth daily.     lisinopril (ZESTRIL) 40 MG tablet Take 1 tablet (40 mg total) by mouth daily.     magnesium oxide (MAG-OX) 400 (241.3 Mg) MG tablet Take 1 tablet (400 mg total) by mouth 2 (two) times daily.     Multiple Vitamin (MULTIVITAMIN WITH MINERALS) TABS tablet Take 1 tablet by mouth daily.  polyethylene glycol (MIRALAX / GLYCOLAX) 17 g packet Take 17 g by mouth daily as needed for mild constipation. 14 each 0   senna-docusate (SENOKOT-S) 8.6-50 MG tablet Take 1 tablet by mouth at bedtime as needed for mild constipation.     thiamine 100 MG tablet Take 1 tablet (100 mg total) by mouth daily.     No current facility-administered medications on file prior to visit.     No Known Allergies  Social History   Occupational History   Occupation: trying to get  disability  Tobacco Use   Smoking status: Former    Packs/day: 2.00    Years: 40.00    Pack years: 80.00    Types: Cigarettes   Smokeless tobacco: Never  Vaping Use   Vaping Use: Former  Substance and Sexual Activity   Alcohol use: Yes    Comment: denies heavy use   Drug use: Yes    Types: Marijuana   Sexual activity: Not on file    Family History  Problem Relation Age of Onset   Diabetes Mother    Hypertension Mother    Hypertension Father    Diabetes Maternal Grandmother    Hypertension Maternal Grandmother    Arthritis Maternal Grandmother    Congestive Heart Failure Neg Hx     Immunization History  Administered Date(s) Administered   Influenza,inj,Quad PF,6+ Mos 01/08/2020    Objective: Vitals:   08/01/20 1118  BP: (!) 181/97  Pulse: (!) 49  Temp: (!) 97.1 F (36.2 C)  SpO2: 90%    Kevin [name] is a pleasant 55 y.o. male WD, WN in NAD. AAO X 3.  Vascular Examination: Capillary fill time to digits <3 seconds b/l lower extremities. Palpable DP pulse(s) b/l lower extremities Palpable PT pulse(s) b/l lower extremities Pedal hair sparse. Lower extremity skin temperature gradient within normal limits. No pain with calf compression b/l. No edema noted b/l lower extremities.  Dermatological Examination: No open wounds b/l lower extremities. No interdigital macerations b/l lower extremities. Toenails 1-5 b/l elongated, discolored, dystrophic, thickened, crumbly with subungual debris and tenderness to dorsal palpation. Pedal skin noted to be dry and flaky b/l lower extremities.  Musculoskeletal Examination: Normal muscle strength 5/5 to all lower extremity muscle groups bilaterally. No pain crepitus or joint limitation noted with ROM b/l. No gross bony deformities bilaterally.  Footwear Assessment: Does the patient wear appropriate shoes? Yes. Does the patient need inserts/orthotics? No.  Neurological Examination: Protective sensation intact 5/5 intact  bilaterally with 10g monofilament b/l. Vibratory sensation intact b/l. Clonus negative b/l.  Lab: Hemoglobin A1C Latest Ref Rng & Units 01/05/2020 11/29/2019  HGBA1C 4.8 - 5.6 % 5.8(H) 6.7(H)  Some recent data might be hidden   Assessment: 1. Pain due to onychomycosis of toenails of both feet   2. Xerosis cutis   3. Diabetes mellitus without complication (HCC)   4. Encounter for diabetic foot exam (HCC)   ADA Risk Categorization:  Low Risk:  Patient has all of the following: Intact protective sensation No prior foot ulcer  No severe deformity Pedal pulses present  Plan: -Examined patient. -Order written for [employer] staff to apply Aquaphor Ointment to both feet once daily for dry skin. -Patient to continue soft, supportive shoe gear daily. -Toenails 1-5 b/l were debrided in length and girth with sterile nail nippers and dremel without iatrogenic bleeding.  -Patient to report any pedal injuries to medical professional immediately. -Patient/POA to call should there be question/concern in the interim.  Return in  about 3 months (around 11/01/2020).  Freddie Breech, DPM

## 2020-10-24 ENCOUNTER — Telehealth: Payer: Self-pay | Admitting: *Deleted

## 2020-10-24 NOTE — Telephone Encounter (Signed)
Patient's mother is calling and wanted to let doctor know that patient has been moved from [facility] to [facility] nursing and rehab. In [place]).   Please fax a copy of letter to their office [phone]). She had to cancel his upcoming appointment, cannot get to him as quickly as she did before.Please advise.

## 2020-11-07 ENCOUNTER — Ambulatory Visit: Payer: Medicaid Other | Admitting: Podiatry

## 2020-11-11 NOTE — Telephone Encounter (Signed)
Calling patient's mother for more information concerning a letter for his transfer to [facility] and rehab, no answer, left vmessage for call back.

## 2021-01-13 LAB — TSH: TSH: 0.06 — AB (ref 0.41–5.90)

## 2021-01-13 LAB — VITAMIN D 25 HYDROXY (VIT D DEFICIENCY, FRACTURES): Vit D, 25-Hydroxy: 23.9

## 2021-02-03 ENCOUNTER — Ambulatory Visit (INDEPENDENT_AMBULATORY_CARE_PROVIDER_SITE_OTHER): Payer: Medicaid Other | Admitting: Nurse Practitioner

## 2021-02-03 ENCOUNTER — Other Ambulatory Visit: Payer: Self-pay

## 2021-02-03 ENCOUNTER — Encounter: Payer: Self-pay | Admitting: Nurse Practitioner

## 2021-02-03 VITALS — BP 145/93 | HR 73 | Ht 73.25 in | Wt 173.8 lb

## 2021-02-03 DIAGNOSIS — R7989 Other specified abnormal findings of blood chemistry: Secondary | ICD-10-CM | POA: Diagnosis not present

## 2021-02-03 NOTE — Progress Notes (Signed)
02/03/2021     Endocrinology Consult Note    Subjective:    Patient ID: Kevin [name], male    DOB: [birth year], PCP Medicine, [facility].   Past Medical History:  Diagnosis Date   Blind left eye    CKD (chronic kidney disease), stage III ([place])    CVA (cerebral vascular accident) ([place])    Diabetes mellitus without complication ([place])    Hypertension     Past Surgical History:  Procedure Laterality Date   BUBBLE STUDY  01/07/2020   Procedure: BUBBLE STUDY;  Surgeon: Jerline Pain, MD;  Location: [facility];  Service: Cardiovascular;;   GYNECOMASTIA EXCISION     TEE WITHOUT CARDIOVERSION N/A 01/07/2020   Procedure: TRANSESOPHAGEAL ECHOCARDIOGRAM (TEE);  Surgeon: Jerline Pain, MD;  Location: [facility];  Service: Cardiovascular;  Laterality: N/A;    Social History   Socioeconomic History   Marital status: Single    Spouse name: Not on file   Number of children: Not on file   Years of education: Not on file   Highest education level: Not on file  Occupational History   Occupation: trying to get disability  Tobacco Use   Smoking status: Former    Packs/day: 2.00    Years: 40.00    Pack years: 80.00    Types: Cigarettes   Smokeless tobacco: Never  Vaping Use   Vaping Use: Former  Substance and Sexual Activity   Alcohol use: Yes    Comment: denies heavy use   Drug use: Yes    Types: Marijuana   Sexual activity: Not on file  Other Topics Concern   Not on file  Social History Narrative   Not on file   Social Determinants of Health   Financial Resource Strain: Not on file  Food Insecurity: Not on file  Transportation Needs: Not on file  Physical Activity: Not on file  Stress: Not on file  Social Connections: Not on file    Family History  Problem Relation Age of Onset   Diabetes Mother    Hypertension Mother    Hypertension Father    Diabetes Maternal Grandmother    Hypertension Maternal Grandmother    Arthritis  Maternal Grandmother    Congestive Heart Failure Neg Hx     Outpatient Encounter Medications as of 02/03/2021  Medication Sig   acetaminophen (TYLENOL) 325 MG tablet Take 2 tablets (650 mg total) by mouth every 6 (six) hours as needed for mild pain (or Fever >/= 101).   amLODipine (NORVASC) 10 MG tablet Take 10 mg by mouth daily.   apixaban (ELIQUIS) 5 MG TABS tablet Take 1 tablet (5 mg total) by mouth 2 (two) times daily.   ascorbic acid (VITAMIN C) 500 MG tablet Take 500 mg by mouth daily.   Cholecalciferol (VITAMIN D) 50 MCG (2000 UT) CAPS Take 2,000 Units by mouth daily.   ferrous sulfate 325 (65 FE) MG tablet Take 325 mg by mouth daily with breakfast.   hydrochlorothiazide (HYDRODIURIL) 25 MG tablet Take 1 tablet (25 mg total) by mouth daily.   insulin lispro (HUMALOG) 100 UNIT/ML injection Inject 4 Units into the skin 3 (three) times daily before meals. Hold if BS less than 120   lisinopril (ZESTRIL) 40 MG tablet Take 1 tablet (40 mg total) by mouth daily.   magnesium oxide (MAG-OX) 400 (241.3 Mg) MG tablet Take 1 tablet (400 mg total) by mouth 2 (two) times daily.   medroxyPROGESTERone (DEPO-PROVERA) 150  MG/ML injection Inject 150 mg into the muscle every 3 (three) months.   metFORMIN (GLUCOPHAGE) 500 MG tablet Take 500 mg by mouth 2 (two) times daily with a meal.   Multiple Vitamin (MULTIVITAMIN WITH MINERALS) TABS tablet Take 1 tablet by mouth daily.   polyethylene glycol (MIRALAX / GLYCOLAX) 17 g packet Take 17 g by mouth daily as needed for mild constipation.   risperiDONE (RISPERDAL) 0.5 MG tablet Take 0.5 mg by mouth in the morning, at noon, and at bedtime.   senna-docusate (SENOKOT-S) 8.6-50 MG tablet Take 1 tablet by mouth at bedtime as needed for mild constipation.   sertraline (ZOLOFT) 50 MG tablet Take 50 mg by mouth daily.   thiamine 100 MG tablet Take 1 tablet (100 mg total) by mouth daily.   [DISCONTINUED] atorvastatin (LIPITOR) 40 MG tablet Take 1 tablet (40 mg total) by  mouth daily. (Patient not taking: Reported on 02/03/2021)   No facility-administered encounter medications on file as of 02/03/2021.    ALLERGIES: No Known Allergies  VACCINATION STATUS: Immunization History  Administered Date(s) Administered   Influenza,inj,Quad PF,6+ Mos 01/08/2020     HPI  Kevin [name] [place] is 56 y.o. male who presents today with a medical history as above. he is being seen in consultation for hyperthyroidism requested by Medicine, [facility].  He denies any specific symptoms of thyroid disorder.   his most recent thyroid labs revealed slightly suppressed TSH of 0.064 on 01/13/21. he denies dysphagia, choking, shortness of breath, no recent voice change.    he does not have family history of thyroid dysfunction that he is aware of, and denies family hx of thyroid cancer. he denies personal history of goiter. he is not on any anti-thyroid medications nor on any thyroid hormone supplements. Denies use of Biotin containing supplements.  he is willing to proceed with appropriate work up and therapy for thyrotoxicosis.  He has never had any imaging of his thyroid in the past.  He did have similar instance back in 2021 with marginally suppressed TSH but normal other TFTs.   Review of systems  Constitutional: + Minimally fluctuating body weight, current Body mass index is 22.77 kg/m., no fatigue, no subjective hyperthermia, no subjective hypothermia Eyes: no blurry vision, no xerophthalmia ENT: no sore throat, no nodules palpated in throat, no dysphagia/odynophagia, no hoarseness Cardiovascular: no chest pain, no shortness of breath, no palpitations, no leg swelling Respiratory: no cough, no shortness of breath Gastrointestinal: no nausea/vomiting/diarrhea Musculoskeletal: no muscle/joint aches, has wheelchair for long distances from previous CVA Skin: no rashes, no hyperemia Neurological: no tremors, no numbness, no tingling, no dizziness Psychiatric: no  depression, no anxiety   Objective:    BP (!) 145/93    Pulse 73    Ht 6' 1.25" (1.861 m) Comment: Measured with shoes on   Wt 173 lb 12.8 oz (78.8 kg)    SpO2 99%    BMI 22.77 kg/m   Wt Readings from Last 3 Encounters:  02/03/21 173 lb 12.8 oz (78.8 kg)  03/26/20 158 lb 8.2 oz (71.9 kg)  01/08/20 161 lb 6 oz (73.2 kg)     BP Readings from Last 3 Encounters:  02/03/21 (!) 145/93  08/01/20 (!) 181/97  03/26/20 125/69                        Physical Exam- Limited  Constitutional:  Body mass index is 22.77 kg/m. , not in acute distress, normal state of  mind Eyes:  EOMI, no exophthalmos Neck: Supple Thyroid: No gross goiter Cardiovascular: RRR, no murmurs, rubs, or gallops, no edema Respiratory: Adequate breathing efforts, no crackles, rales, rhonchi, or wheezing Musculoskeletal: no gross deformities, strength intact in all four extremities, no gross restriction of joint movements, has wheelchair for long distances from previous CVA Skin:  no rashes, no hyperemia Neurological: no tremor with outstretched hands, DTR normal to BLE   CMP     Component Value Date/Time   NA 140 03/26/2020 0734   K 3.7 03/26/2020 0734   CL 100 03/26/2020 0734   CO2 33 (H) 03/26/2020 0734   GLUCOSE 126 (H) 03/26/2020 0734   BUN 22 (H) 03/26/2020 0734   CREATININE 1.38 (H) 03/26/2020 0734   CALCIUM 9.5 03/26/2020 0734   PROT 6.7 03/19/2020 1111   ALBUMIN 3.7 03/19/2020 1111   AST 20 03/19/2020 1111   ALT 25 03/19/2020 1111   ALKPHOS 58 03/19/2020 1111   BILITOT 0.6 03/19/2020 1111   GFRNONAA >60 03/26/2020 0734     CBC    Component Value Date/Time   WBC 4.1 03/26/2020 0734   RBC 3.66 (L) 03/26/2020 0734   HGB 10.8 (L) 03/26/2020 0734   HCT 34.7 (L) 03/26/2020 0734   PLT 123 (L) 03/26/2020 0734   MCV 94.8 03/26/2020 0734   MCH 29.5 03/26/2020 0734   MCHC 31.1 03/26/2020 0734   RDW 13.5 03/26/2020 0734   LYMPHSABS 1.8 03/26/2020 0734   MONOABS 0.3 03/26/2020 0734   EOSABS 0.1  03/26/2020 0734   BASOSABS 0.0 03/26/2020 0734     Diabetic Labs (most recent): Lab Results  Component Value Date   HGBA1C 5.8 (H) 01/05/2020   HGBA1C 6.7 (H) 11/29/2019    Lipid Panel     Component Value Date/Time   CHOL 107 03/22/2020 0236   TRIG 34 03/22/2020 0236   HDL 51 03/22/2020 0236   CHOLHDL 2.1 03/22/2020 0236   VLDL 7 03/22/2020 0236   LDLCALC 49 03/22/2020 0236     Lab Results  Component Value Date   TSH 0.06 (A) 01/13/2021   TSH 0.447 11/30/2019   TSH 0.162 (L) 11/29/2019   FREET4 1.10 11/30/2019        Assessment & Plan:   1) Abnormal TSH  he is being seen at a kind request of Medicine, [facility].  his history and most recent labs are reviewed, and he was examined clinically. Subjective and objective findings are not consistent with thyrotoxicosis likely from primary hyperthyroidism. However, we did discuss the potential risks of untreated thyrotoxicosis and the need for definitive therapy and he agrees to proceed with diagnostic workup and treatment plan.   I will repeat full profile thyroid function tests today, including antibody testing to help classify his dysfunction.  I did not order thyroid ultrasound or uptake and scan today based on his clinica presentation and TFTs.       he will return in 1 week for treatment decision.   I did not initiate any new prescriptions at today's visit.  HR was stable at 73.    -Patient is advised to maintain close follow up with Medicine, [facility] for primary care needs.   - Time spent with the patient: 60 minutes, of which >50% was spent in obtaining information about his symptoms, reviewing his previous labs, evaluations, and treatments, counseling him about his hyperthyroidism , and developing a plan to confirm the diagnosis and long term treatment as necessary. Please  refer to "Patient Self Inventory" in the Media tab for reviewed elements of pertinent patient  history.  [place] participated in the discussions, expressed understanding, and voiced agreement with the above plans.  All questions were answered to his satisfaction. he is encouraged to contact clinic should he have any questions or concerns prior to his return visit.   Follow up plan: Return in about 1 week (around 02/10/2021) for Thyroid follow up, Previsit labs.   Thank you for involving me in the care of this pleasant patient, and I will continue to update you with his progress.    Rayetta Pigg, [facility] [address] [place], [place] [address] Phone: [phone] Fax: [phone]  02/03/2021, 4:14 PM

## 2021-02-03 NOTE — Patient Instructions (Signed)
Thyroid-Stimulating Hormone Test Why am I having this test? The thyroid is a gland in the lower front of the neck. It makes hormones that affect many body parts and systems, including the system that affects how quickly the body burns fuel for energy (metabolism). The pituitary gland is located just below the brain, behind the eyes and nasal passages. It helps maintain thyroid hormone levels and thyroid gland function. You may have a thyroid-stimulating hormone (TSH) test if you have possible symptoms of abnormal thyroid hormone levels. This test can help your health care provider: Diagnose a disorder of the thyroid gland or pituitary gland. Manage your condition and treatment if you have an underactive thyroid (hypothyroidism) or an overactive thyroid (hyperthyroidism). Newborn babies may have this test done to screen for hypothyroidism that is present at birth (congenital). What is being tested? This test measures the amount of TSH in your blood. TSH may also be called thyrotropin. When the thyroid does not make enough hormones, the pituitary gland releases TSH into the bloodstream to stimulate the thyroid gland to make more hormones. What kind of sample is taken?   A blood sample is required for this test. It is usually collected by inserting a needle into a blood vessel. For newborns, a small amount of blood may be collected from the umbilical cord, or by using a small needle to prick the baby's heel (heel stick). Tell a health care provider about: All medicines you are taking, including vitamins, herbs, eye drops, creams, and over-the-counter medicines. Any blood disorders you have. Any surgeries you have had. Any medical conditions you have. Whether you are pregnant or may be pregnant. How are the results reported? Your test results will be reported as a value that indicates how much TSH is in your blood. Your health care provider will compare your results to normal ranges that were  established after testing a large group of people (reference ranges). Reference ranges may vary among labs and hospitals. For this test, common reference ranges are: Adult: 2-10 microunits/mL or 2-10 milliunits/L. Newborn: Heel stick: 3-18 microunits/mL or 3-18 milliunits/L. Umbilical cord: 3-12 microunits/mL or 3-12 milliunits/L. What do the results mean? Results that are within the reference range are considered normal. This means that you have a normal amount of TSH in your blood. Results that are higher than the reference range mean that your TSH levels are too high. This may mean: Your thyroid gland is not making enough thyroid hormones. Your thyroid medicine dosage is too low. You have a tumor on your pituitary gland. This is rare. Results that are lower than the reference range mean that your TSH levels are too low. This may be caused by hyperthyroidism or by a problem with the pituitary gland function. Talk with your health care provider about what your results mean. Questions to ask your health care provider Ask your health care provider, or the department that is doing the test: When will my results be ready? How will I get my results? What are my treatment options? What other tests do I need? What are my next steps? Summary You may have a thyroid-stimulating hormone (TSH) test if you have possible symptoms of abnormal thyroid hormone levels. The thyroid is a gland in the lower front of the neck. It makes hormones that affect many body parts and systems. The pituitary gland is located just below the brain, behind the eyes and nasal passages. It helps maintain thyroid hormone levels and thyroid gland function. This test measures the   amount of TSH in your blood. TSH is made by the pituitary gland. It may also be called thyrotropin. This information is not intended to replace advice given to you by your health care provider. Make sure you discuss any questions you have with your  health care provider. Document Revised: 09/04/2020 Document Reviewed: 09/13/2019 Elsevier Patient Education  2022 [employer]  

## 2021-02-10 ENCOUNTER — Ambulatory Visit: Payer: Medicaid Other | Admitting: Nurse Practitioner

## 2021-02-11 LAB — TSH: TSH: 0.21 — AB (ref 0.41–5.90)

## 2021-02-25 ENCOUNTER — Ambulatory Visit (INDEPENDENT_AMBULATORY_CARE_PROVIDER_SITE_OTHER): Payer: Medicaid Other | Admitting: Nurse Practitioner

## 2021-02-25 ENCOUNTER — Other Ambulatory Visit: Payer: Self-pay

## 2021-02-25 ENCOUNTER — Encounter: Payer: Self-pay | Admitting: Nurse Practitioner

## 2021-02-25 VITALS — BP 131/78 | HR 78 | Ht 73.25 in | Wt 171.2 lb

## 2021-02-25 DIAGNOSIS — E0590 Thyrotoxicosis, unspecified without thyrotoxic crisis or storm: Secondary | ICD-10-CM | POA: Diagnosis not present

## 2021-02-25 DIAGNOSIS — R7989 Other specified abnormal findings of blood chemistry: Secondary | ICD-10-CM | POA: Diagnosis not present

## 2021-02-25 NOTE — Progress Notes (Signed)
02/25/2021     Endocrinology Follow Up Note    Subjective:    Patient ID: [name], male    DOB: 1965/05/14, PCP Medicine, [facility].   Past Medical History:  Diagnosis Date   Blind left eye    CKD (chronic kidney disease), stage III ([place])    CVA (cerebral vascular accident) ([place])    Diabetes mellitus without complication ([place])    Hypertension     Past Surgical History:  Procedure Laterality Date   BUBBLE STUDY  01/07/2020   Procedure: BUBBLE STUDY;  Surgeon: Jerline Pain, MD;  Location: [facility];  Service: Cardiovascular;;   GYNECOMASTIA EXCISION     TEE WITHOUT CARDIOVERSION N/A 01/07/2020   Procedure: TRANSESOPHAGEAL ECHOCARDIOGRAM (TEE);  Surgeon: Jerline Pain, MD;  Location: [facility];  Service: Cardiovascular;  Laterality: N/A;    Social History   Socioeconomic History   Marital status: Single    Spouse name: Not on file   Number of children: Not on file   Years of education: Not on file   Highest education level: Not on file  Occupational History   Occupation: trying to get disability  Tobacco Use   Smoking status: Former    Packs/day: 2.00    Years: 40.00    Pack years: 80.00    Types: Cigarettes   Smokeless tobacco: Never  Vaping Use   Vaping Use: Former  Substance and Sexual Activity   Alcohol use: Yes    Comment: denies heavy use   Drug use: Yes    Types: Marijuana   Sexual activity: Not on file  Other Topics Concern   Not on file  Social History Narrative   Not on file   Social Determinants of Health   Financial Resource Strain: Not on file  Food Insecurity: Not on file  Transportation Needs: Not on file  Physical Activity: Not on file  Stress: Not on file  Social Connections: Not on file    Family History  Problem Relation Age of Onset   Diabetes Mother    Hypertension Mother    Hypertension Father    Diabetes Maternal Grandmother    Hypertension Maternal Grandmother    Arthritis  Maternal Grandmother    Congestive Heart Failure Neg Hx     Outpatient Encounter Medications as of 02/25/2021  Medication Sig   acetaminophen (TYLENOL) 325 MG tablet Take 2 tablets (650 mg total) by mouth every 6 (six) hours as needed for mild pain (or Fever >/= 101).   amLODipine (NORVASC) 10 MG tablet Take 10 mg by mouth daily.   apixaban (ELIQUIS) 5 MG TABS tablet Take 1 tablet (5 mg total) by mouth 2 (two) times daily.   ascorbic acid (VITAMIN C) 500 MG tablet Take 500 mg by mouth daily.   Cholecalciferol (VITAMIN D) 50 MCG (2000 UT) CAPS Take 2,000 Units by mouth daily.   ferrous sulfate 325 (65 FE) MG tablet Take 325 mg by mouth daily with breakfast.   hydrochlorothiazide (HYDRODIURIL) 25 MG tablet Take 1 tablet (25 mg total) by mouth daily.   insulin lispro (HUMALOG) 100 UNIT/ML injection Inject 4 Units into the skin 3 (three) times daily before meals. Hold if BS less than 120   lisinopril (ZESTRIL) 40 MG tablet Take 1 tablet (40 mg total) by mouth daily.   magnesium oxide (MAG-OX) 400 (241.3 Mg) MG tablet Take 1 tablet (400 mg total) by mouth 2 (two) times daily.   medroxyPROGESTERone (DEPO-PROVERA)  150 MG/ML injection Inject 150 mg into the muscle every 3 (three) months.   metFORMIN (GLUCOPHAGE) 500 MG tablet Take 500 mg by mouth 2 (two) times daily with a meal.   Multiple Vitamin (MULTIVITAMIN WITH MINERALS) TABS tablet Take 1 tablet by mouth daily.   polyethylene glycol (MIRALAX / GLYCOLAX) 17 g packet Take 17 g by mouth daily as needed for mild constipation.   risperiDONE (RISPERDAL) 0.5 MG tablet Take 0.5 mg by mouth in the morning, at noon, and at bedtime.   senna-docusate (SENOKOT-S) 8.6-50 MG tablet Take 1 tablet by mouth at bedtime as needed for mild constipation.   sertraline (ZOLOFT) 50 MG tablet Take 50 mg by mouth daily.   thiamine 100 MG tablet Take 1 tablet (100 mg total) by mouth daily.   No facility-administered encounter medications on file as of 02/25/2021.     ALLERGIES: No Known Allergies  VACCINATION STATUS: Immunization History  Administered Date(s) Administered   Influenza,inj,Quad PF,6+ Mos 01/08/2020     HPI  [name] is 56 y.o. male who presents today with a medical history as above. he is being seen in follow up after being seen in consultation for hyperthyroidism requested by Medicine, [facility].  He denies any specific symptoms of thyroid disorder.   his most recent thyroid labs revealed slightly suppressed TSH of 0.064 on 01/13/21. he denies dysphagia, choking, shortness of breath, no recent voice change.    he does not have family history of thyroid dysfunction that he is aware of, and denies family hx of thyroid cancer. he denies personal history of goiter. he is not on any anti-thyroid medications nor on any thyroid hormone supplements. Denies use of Biotin containing supplements.  he is willing to proceed with appropriate work up and therapy for thyrotoxicosis.  He has never had any imaging of his thyroid in the past.  He did have similar instance back in 2021 with marginally suppressed TSH but normal other TFTs.   Review of systems  Constitutional: + Minimally fluctuating body weight, current Body mass index is 22.43 kg/m., no fatigue, no subjective hyperthermia, no subjective hypothermia Eyes: no blurry vision, no xerophthalmia ENT: no sore throat, no nodules palpated in throat, no dysphagia/odynophagia, no hoarseness Cardiovascular: no chest pain, no shortness of breath, no palpitations, no leg swelling Respiratory: no cough, no shortness of breath Gastrointestinal: no nausea/vomiting/diarrhea Musculoskeletal: no muscle/joint aches, has wheelchair for long distances from previous CVA Skin: no rashes, no hyperemia Neurological: no tremors, no numbness, no tingling, no dizziness Psychiatric: no depression, no anxiety   Objective:    BP 131/78    Pulse 78    Ht 6' 1.25" (1.861 m)    Wt 171 lb  3.2 oz (77.7 kg)    SpO2 99%    BMI 22.43 kg/m   Wt Readings from Last 3 Encounters:  02/25/21 171 lb 3.2 oz (77.7 kg)  02/03/21 173 lb 12.8 oz (78.8 kg)  03/26/20 158 lb 8.2 oz (71.9 kg)     BP Readings from Last 3 Encounters:  02/25/21 131/78  02/03/21 (!) 145/93  08/01/20 (!) 181/97                        Physical Exam- Limited  Constitutional:  Body mass index is 22.43 kg/m. , not in acute distress, normal state of mind Eyes:  EOMI, no exophthalmos Neck: Supple Cardiovascular: RRR, no murmurs, rubs, or gallops, no edema Respiratory: Adequate breathing efforts,  no crackles, rales, rhonchi, or wheezing Musculoskeletal: no gross deformities, strength intact in all four extremities, no gross restriction of joint movements, has wheelchair for long distances from previous CVA, inattention to left arm (result of stroke) Skin:  no rashes, no hyperemia Neurological: no tremor with outstretched hands, DTR normal to BLE   CMP     Component Value Date/Time   NA 140 03/26/2020 0734   K 3.7 03/26/2020 0734   CL 100 03/26/2020 0734   CO2 33 (H) 03/26/2020 0734   GLUCOSE 126 (H) 03/26/2020 0734   BUN 22 (H) 03/26/2020 0734   CREATININE 1.38 (H) 03/26/2020 0734   CALCIUM 9.5 03/26/2020 0734   PROT 6.7 03/19/2020 1111   ALBUMIN 3.7 03/19/2020 1111   AST 20 03/19/2020 1111   ALT 25 03/19/2020 1111   ALKPHOS 58 03/19/2020 1111   BILITOT 0.6 03/19/2020 1111   GFRNONAA >60 03/26/2020 0734     CBC    Component Value Date/Time   WBC 4.1 03/26/2020 0734   RBC 3.66 (L) 03/26/2020 0734   HGB 10.8 (L) 03/26/2020 0734   HCT 34.7 (L) 03/26/2020 0734   PLT 123 (L) 03/26/2020 0734   MCV 94.8 03/26/2020 0734   MCH 29.5 03/26/2020 0734   MCHC 31.1 03/26/2020 0734   RDW 13.5 03/26/2020 0734   LYMPHSABS 1.8 03/26/2020 0734   MONOABS 0.3 03/26/2020 0734   EOSABS 0.1 03/26/2020 0734   BASOSABS 0.0 03/26/2020 0734     Diabetic Labs (most recent): Lab Results  Component Value Date    HGBA1C 5.8 (H) 01/05/2020   HGBA1C 6.7 (H) 11/29/2019    Lipid Panel     Component Value Date/Time   CHOL 107 03/22/2020 0236   TRIG 34 03/22/2020 0236   HDL 51 03/22/2020 0236   CHOLHDL 2.1 03/22/2020 0236   VLDL 7 03/22/2020 0236   LDLCALC 49 03/22/2020 0236     Lab Results  Component Value Date   TSH 0.21 (A) 02/11/2021   TSH 0.06 (A) 01/13/2021   TSH 0.447 11/30/2019   TSH 0.162 (L) 11/29/2019   FREET4 1.10 11/30/2019     TSH TSH Resulted: 02/11/21 0000  Result status: Final  Resulting lab: OTHER  Reference range: 0.41 - 5.90  Value: 0.21 Abnormal    Comment: FT4- 1.26, T3- 82; Thyro ab- < 1; TPO ab- 22  *Additional information available - comment     Assessment & Plan:   1) Subclinical Hyperthyroidism  he is being seen at a kind request of Medicine, [facility].  His repeat thyroid function shows improved yet still slightly suppressed TSH and normal FT4 and T3 values.  His antibody testing was negative ruling out autoimmune thyroid dysfunction.  He still reports no obvious symptoms of overactive thyroid.   He does not require any medical intervention or further testing at this time.  Will repeat thyroid function in 4 months for surveillance.        -Patient is advised to maintain close follow up with Medicine, [facility] for primary care needs.    I spent 20 minutes in the care of the patient today including review of labs from Thyroid Function, CMP, and other relevant labs ; imaging/biopsy records (current and previous including abstractions from other facilities); face-to-face time discussing  his lab results and symptoms, medications doses, his options of short and long term treatment based on the latest standards of care / guidelines;   and documenting the encounter.  Lanny Hurst  L Kemppainen  participated in the discussions, expressed understanding, and voiced agreement with the above plans.  All questions were answered to  his satisfaction. he is encouraged to contact clinic should he have any questions or concerns prior to his return visit.  Follow up plan: Return in about 4 months (around 06/25/2021) for Thyroid follow up, Previsit labs.   Thank you for involving me in the care of this pleasant patient, and I will continue to update you with his progress.    Rayetta Pigg, [facility] [address] [place], [place] [address] Phone: [phone] Fax: [phone]  02/25/2021, 10:15 AM

## 2021-06-23 LAB — TSH: TSH: 0.23 — AB (ref 0.41–5.90)

## 2021-06-25 ENCOUNTER — Ambulatory Visit: Payer: Medicaid Other | Admitting: Nurse Practitioner

## 2021-07-08 ENCOUNTER — Ambulatory Visit: Payer: Medicaid Other | Admitting: Nurse Practitioner

## 2021-08-03 LAB — TSH: TSH: 0.23 — AB (ref 0.41–5.90)

## 2021-08-05 ENCOUNTER — Ambulatory Visit (INDEPENDENT_AMBULATORY_CARE_PROVIDER_SITE_OTHER): Payer: Medicaid Other | Admitting: Nurse Practitioner

## 2021-08-05 ENCOUNTER — Encounter: Payer: Self-pay | Admitting: Nurse Practitioner

## 2021-08-05 VITALS — BP 142/87 | HR 76 | Ht 73.25 in | Wt 182.0 lb

## 2021-08-05 DIAGNOSIS — E0590 Thyrotoxicosis, unspecified without thyrotoxic crisis or storm: Secondary | ICD-10-CM | POA: Diagnosis not present

## 2021-08-05 NOTE — Progress Notes (Signed)
08/05/2021     Endocrinology Follow Up Note    Subjective:    Patient ID: Kevin [name], male    DOB: December 29, 1965, PCP Medicine, [facility].   Past Medical History:  Diagnosis Date   Blind left eye    CKD (chronic kidney disease), stage III ([facility])    CVA (cerebral vascular accident) ([facility])    Diabetes mellitus without complication ([facility])    Hypertension     Past Surgical History:  Procedure Laterality Date   BUBBLE STUDY  01/07/2020   Procedure: BUBBLE STUDY;  Surgeon: Jake Bathe, MD;  Location: [facility];  Service: Cardiovascular;;   GYNECOMASTIA EXCISION     TEE WITHOUT CARDIOVERSION N/A 01/07/2020   Procedure: TRANSESOPHAGEAL ECHOCARDIOGRAM (TEE);  Surgeon: Jake Bathe, MD;  Location: [facility] ENDOSCOPY;  Service: Cardiovascular;  Laterality: N/A;    Social History   Socioeconomic History   Marital status: Single    Spouse name: Not on file   Number of children: Not on file   Years of education: Not on file   Highest education level: Not on file  Occupational History   Occupation: trying to get disability  Tobacco Use   Smoking status: Former    Packs/day: 2.00    Years: 40.00    Total pack years: 80.00    Types: Cigarettes   Smokeless tobacco: Never  Vaping Use   Vaping Use: Former  Substance and Sexual Activity   Alcohol use: Yes    Comment: denies heavy use   Drug use: Yes    Types: Marijuana   Sexual activity: Not on file  Other Topics Concern   Not on file  Social History Narrative   Not on file   Social Determinants of Health   Financial Resource Strain: Not on file  Food Insecurity: Not on file  Transportation Needs: Not on file  Physical Activity: Not on file  Stress: Not on file  Social Connections: Not on file    Family History  Problem Relation Age of Onset   Diabetes Mother    Hypertension Mother    Hypertension Father    Diabetes Maternal Grandmother    Hypertension Maternal Grandmother     Arthritis Maternal Grandmother    Congestive Heart Failure Neg Hx     Outpatient Encounter Medications as of 08/05/2021  Medication Sig   acetaminophen (TYLENOL) 325 MG tablet Take 2 tablets (650 mg total) by mouth every 6 (six) hours as needed for mild pain (or Fever >/= 101).   amLODipine (NORVASC) 10 MG tablet Take 10 mg by mouth daily.   apixaban (ELIQUIS) 5 MG TABS tablet Take 1 tablet (5 mg total) by mouth 2 (two) times daily.   ascorbic acid (VITAMIN C) 500 MG tablet Take 500 mg by mouth daily.   Cholecalciferol (VITAMIN D) 50 MCG (2000 UT) CAPS Take 2,000 Units by mouth daily.   ferrous sulfate 325 (65 FE) MG tablet Take 325 mg by mouth daily with breakfast.   hydrochlorothiazide (HYDRODIURIL) 25 MG tablet Take 1 tablet (25 mg total) by mouth daily.   insulin lispro (HUMALOG) 100 UNIT/ML injection Inject 5 Units into the skin 3 (three) times daily before meals. Hold if BS less than 120   lisinopril (ZESTRIL) 40 MG tablet Take 1 tablet (40 mg total) by mouth daily.   magnesium oxide (MAG-OX) 400 (241.3 Mg) MG tablet Take 1 tablet (400 mg total) by mouth 2 (two) times daily.   medroxyPROGESTERone (  DEPO-PROVERA) 150 MG/ML injection Inject 150 mg into the muscle every 3 (three) months.   metFORMIN (GLUCOPHAGE) 500 MG tablet Take 500 mg by mouth 2 (two) times daily with a meal.   Multiple Vitamin (MULTIVITAMIN WITH MINERALS) TABS tablet Take 1 tablet by mouth daily.   polyethylene glycol (MIRALAX / GLYCOLAX) 17 g packet Take 17 g by mouth daily as needed for mild constipation.   risperiDONE (RISPERDAL) 0.5 MG tablet Take 0.5 mg by mouth in the morning, at noon, and at bedtime.   senna-docusate (SENOKOT-S) 8.6-50 MG tablet Take 1 tablet by mouth at bedtime as needed for mild constipation.   sertraline (ZOLOFT) 50 MG tablet Take 50 mg by mouth daily.   thiamine 100 MG tablet Take 1 tablet (100 mg total) by mouth daily.   No facility-administered encounter medications on file as of 08/05/2021.     ALLERGIES: No Known Allergies  VACCINATION STATUS: Immunization History  Administered Date(s) Administered   Influenza,inj,Quad PF,6+ Mos 01/08/2020     HPI  Kevin [name] is 56 y.o. male who presents today with a medical history as above. he is being seen in follow up after being seen in consultation for hyperthyroidism requested by Medicine, [facility].  He denies any specific symptoms of thyroid disorder.   his most recent thyroid labs revealed slightly suppressed TSH of 0.064 on 01/13/21. he denies dysphagia, choking, shortness of breath, no recent voice change.    he does not have family history of thyroid dysfunction that he is aware of, and denies family hx of thyroid cancer. he denies personal history of goiter. he is not on any anti-thyroid medications nor on any thyroid hormone supplements. Denies use of Biotin containing supplements.  he is willing to proceed with appropriate work up and therapy for thyrotoxicosis.  He has never had any imaging of his thyroid in the past.  He did have similar instance back in 2021 with marginally suppressed TSH but normal other TFTs.   Review of systems  Constitutional: + Minimally fluctuating body weight,  current Body mass index is 23.85 kg/m. , no fatigue, no subjective hyperthermia, no subjective hypothermia Eyes: no blurry vision, no xerophthalmia ENT: no sore throat, no nodules palpated in throat, no dysphagia/odynophagia, no hoarseness Cardiovascular: no chest pain, no shortness of breath, no palpitations, no leg swelling Respiratory: no cough, no shortness of breath Gastrointestinal: no nausea/vomiting/diarrhea Musculoskeletal: no muscle/joint aches Skin: no rashes, no hyperemia Neurological: no tremors, no numbness, no tingling, no dizziness Psychiatric: no depression, no anxiety   Objective:    BP (!) 142/87   Pulse 76   Ht 6' 1.25" (1.861 m)   Wt 182 lb (82.6 kg)   BMI 23.85 kg/m   Wt Readings from  Last 3 Encounters:  08/05/21 182 lb (82.6 kg)  02/25/21 171 lb 3.2 oz (77.7 kg)  02/03/21 173 lb 12.8 oz (78.8 kg)     BP Readings from Last 3 Encounters:  08/05/21 (!) 142/87  02/25/21 131/78  02/03/21 (!) 145/93                        Physical Exam- Limited  Constitutional:  Body mass index is 23.85 kg/m. , not in acute distress, normal state of mind Eyes:  EOMI, no exophthalmos Neck: Supple Cardiovascular: RRR, no murmurs, rubs, or gallops, no edema Respiratory: Adequate breathing efforts, no crackles, rales, rhonchi, or wheezing Musculoskeletal: no gross deformities, strength intact in all four extremities, no  gross restriction of joint movements Skin:  no rashes, no hyperemia Neurological: no tremor with outstretched hands   CMP     Component Value Date/Time   NA 140 03/26/2020 0734   K 3.7 03/26/2020 0734   CL 100 03/26/2020 0734   CO2 33 (H) 03/26/2020 0734   GLUCOSE 126 (H) 03/26/2020 0734   BUN 22 (H) 03/26/2020 0734   CREATININE 1.38 (H) 03/26/2020 0734   CALCIUM 9.5 03/26/2020 0734   PROT 6.7 03/19/2020 1111   ALBUMIN 3.7 03/19/2020 1111   AST 20 03/19/2020 1111   ALT 25 03/19/2020 1111   ALKPHOS 58 03/19/2020 1111   BILITOT 0.6 03/19/2020 1111   GFRNONAA >60 03/26/2020 0734     CBC    Component Value Date/Time   WBC 4.1 03/26/2020 0734   RBC 3.66 (L) 03/26/2020 0734   HGB 10.8 (L) 03/26/2020 0734   HCT 34.7 (L) 03/26/2020 0734   PLT 123 (L) 03/26/2020 0734   MCV 94.8 03/26/2020 0734   MCH 29.5 03/26/2020 0734   MCHC 31.1 03/26/2020 0734   RDW 13.5 03/26/2020 0734   LYMPHSABS 1.8 03/26/2020 0734   MONOABS 0.3 03/26/2020 0734   EOSABS 0.1 03/26/2020 0734   BASOSABS 0.0 03/26/2020 0734     Diabetic Labs (most recent): Lab Results  Component Value Date   HGBA1C 5.8 (H) 01/05/2020   HGBA1C 6.7 (H) 11/29/2019    Lipid Panel     Component Value Date/Time   CHOL 107 03/22/2020 0236   TRIG 34 03/22/2020 0236   HDL 51 03/22/2020 0236    CHOLHDL 2.1 03/22/2020 0236   VLDL 7 03/22/2020 0236   LDLCALC 49 03/22/2020 0236     Lab Results  Component Value Date   TSH 0.23 (A) 08/03/2021   TSH 0.23 (A) 06/23/2021   TSH 0.21 (A) 02/11/2021   TSH 0.06 (A) 01/13/2021   TSH 0.447 11/30/2019   TSH 0.162 (L) 11/29/2019   FREET4 1.10 11/30/2019     TSH TSH Resulted: 02/11/21 0000  Result status: Final  Resulting lab: OTHER  Reference range: 0.41 - 5.90  Value: 0.21 Abnormal    Comment: FT4- 1.26, T3- 82; Thyro ab- < 1; TPO ab- 22  *Additional information available - comment   08/03/21 0000   Result status: Final  Resulting lab: OTHER  Reference range: 0.41 - 5.90  Value: 0.23 Abnormal    Comment: T4,Free 1.20, Thyroglobulin Antibody <1.0,     Assessment & Plan:   1) Subclinical Hyperthyroidism  he is being seen at a kind request of Medicine, [facility].  His repeat thyroid function shows improved yet still slightly suppressed TSH and normal FT4 and T3 values.  His antibody testing was negative ruling out autoimmune thyroid dysfunction.  He still reports no obvious symptoms of overactive thyroid.   He does not require any medical intervention or further testing at this time.  Will repeat thyroid function in 4 months for surveillance.        -Patient is advised to maintain close follow up with Medicine, [facility] for primary care needs.    I spent 20 minutes in the care of the patient today including review of labs from Thyroid Function, CMP, and other relevant labs ; imaging/biopsy records (current and previous including abstractions from other facilities); face-to-face time discussing  his lab results and symptoms, medications doses, his options of short and long term treatment based on the latest standards of care / guidelines;  and documenting the encounter.  Kalman Shan [place]  participated in the discussions, expressed understanding, and voiced agreement with the above  plans.  All questions were answered to his satisfaction. he is encouraged to contact clinic should he have any questions or concerns prior to his return visit.  Follow up plan: Return in about 4 months (around 12/06/2021) for Thyroid follow up, Previsit labs.   Thank you for involving me in the care of this pleasant patient, and I will continue to update you with his progress.    Ronny Bacon, [facility] [address] [place], [name] [address] Phone: [phone] Fax: [phone]  08/05/2021, 10:26 AM

## 2021-11-20 LAB — METHYLMALONIC ACID, SERUM: Methylmalonic Acid, Quantitative: 185 nmol/L (ref 0–378)

## 2021-12-10 ENCOUNTER — Ambulatory Visit (INDEPENDENT_AMBULATORY_CARE_PROVIDER_SITE_OTHER): Payer: Medicaid Other | Admitting: Nurse Practitioner

## 2021-12-10 ENCOUNTER — Encounter: Payer: Self-pay | Admitting: Nurse Practitioner

## 2021-12-10 VITALS — BP 156/92 | Ht 73.25 in | Wt 197.0 lb

## 2021-12-10 DIAGNOSIS — E0590 Thyrotoxicosis, unspecified without thyrotoxic crisis or storm: Secondary | ICD-10-CM

## 2021-12-10 DIAGNOSIS — R7989 Other specified abnormal findings of blood chemistry: Secondary | ICD-10-CM | POA: Diagnosis not present

## 2021-12-10 NOTE — Progress Notes (Signed)
12/10/2021     Endocrinology Follow Up Note    Subjective:    Patient ID: Kevin [name], male    DOB: 05-08-1965, PCP Medicine, [facility].   Past Medical History:  Diagnosis Date   Blind left eye    CKD (chronic kidney disease), stage III (HCC)    CVA (cerebral vascular accident) (HCC)    Diabetes mellitus without complication (HCC)    Hypertension     Past Surgical History:  Procedure Laterality Date   BUBBLE STUDY  01/07/2020   Procedure: BUBBLE STUDY;  Surgeon: Jake Bathe, MD;  Location: [facility] END[facility];  Service: Cardiovascular;;   GYNECOMASTIA EXCISION     TEE WITHOUT CARDIOVERSION N/A 01/07/2020   Procedure: TRANSESOPHAGEAL ECHOCARDIOGRAM (TEE);  Surgeon: Jake Bathe, MD;  Location: [facility] ENDOSCOPY;  Service: Cardiovascular;  Laterality: N/A;    Social History   Socioeconomic History   Marital status: Single    Spouse name: Not on file   Number of children: Not on file   Years of education: Not on file   Highest education level: Not on file  Occupational History   Occupation: trying to get disability  Tobacco Use   Smoking status: Former    Packs/day: 2.00    Years: 40.00    Total pack years: 80.00    Types: Cigarettes   Smokeless tobacco: Never  Vaping Use   Vaping Use: Former  Substance and Sexual Activity   Alcohol use: Yes    Comment: denies heavy use   Drug use: Yes    Types: Marijuana   Sexual activity: Not on file  Other Topics Concern   Not on file  Social History Narrative   Not on file   Social Determinants of Health   Financial Resource Strain: Not on file  Food Insecurity: Not on file  Transportation Needs: Not on file  Physical Activity: Not on file  Stress: Not on file  Social Connections: Not on file    Family History  Problem Relation Age of Onset   Diabetes Mother    Hypertension Mother    Hypertension Father    Diabetes Maternal Grandmother    Hypertension Maternal Grandmother     Arthritis Maternal Grandmother    Congestive Heart Failure Neg Hx     Outpatient Encounter Medications as of 12/10/2021  Medication Sig   acetaminophen (TYLENOL) 325 MG tablet Take 2 tablets (650 mg total) by mouth every 6 (six) hours as needed for mild pain (or Fever >/= 101).   amLODipine (NORVASC) 10 MG tablet Take 10 mg by mouth daily.   apixaban (ELIQUIS) 5 MG TABS tablet Take 1 tablet (5 mg total) by mouth 2 (two) times daily.   ascorbic acid (VITAMIN C) 500 MG tablet Take 500 mg by mouth daily.   Cholecalciferol (VITAMIN D) 50 MCG (2000 UT) CAPS Take 2,000 Units by mouth daily.   ferrous sulfate 325 (65 FE) MG tablet Take 325 mg by mouth daily with breakfast.   hydrochlorothiazide (HYDRODIURIL) 25 MG tablet Take 1 tablet (25 mg total) by mouth daily.   insulin lispro (HUMALOG) 100 UNIT/ML injection Inject 5 Units into the skin 3 (three) times daily before meals. Hold if BS less than 120   lisinopril (ZESTRIL) 40 MG tablet Take 1 tablet (40 mg total) by mouth daily.   magnesium oxide (MAG-OX) 400 (241.3 Mg) MG tablet Take 1 tablet (400 mg total) by mouth 2 (two) times daily.   medroxyPROGESTERone (  DEPO-PROVERA) 150 MG/ML injection Inject 150 mg into the muscle every 3 (three) months.   metFORMIN (GLUCOPHAGE) 500 MG tablet Take 500 mg by mouth 2 (two) times daily with a meal.   Multiple Vitamin (MULTIVITAMIN WITH MINERALS) TABS tablet Take 1 tablet by mouth daily.   polyethylene glycol (MIRALAX / GLYCOLAX) 17 g packet Take 17 g by mouth daily as needed for mild constipation.   risperiDONE (RISPERDAL) 0.5 MG tablet Take 0.5 mg by mouth in the morning, at noon, and at bedtime.   senna-docusate (SENOKOT-S) 8.6-50 MG tablet Take 1 tablet by mouth at bedtime as needed for mild constipation.   sertraline (ZOLOFT) 50 MG tablet Take 50 mg by mouth daily.   thiamine 100 MG tablet Take 1 tablet (100 mg total) by mouth daily.   No facility-administered encounter medications on file as of  12/10/2021.    ALLERGIES: No Known Allergies  VACCINATION STATUS: Immunization History  Administered Date(s) Administered   Influenza,inj,Quad PF,6+ Mos 01/08/2020     HPI  Kevin [name] is 56 y.o. male who presents today with a medical history as above. he is being seen in follow up after being seen in consultation for hyperthyroidism requested by Medicine, [facility].  He denies any specific symptoms of thyroid disorder.   his most recent thyroid labs revealed slightly suppressed TSH of 0.064 on 01/13/21. he denies dysphagia, choking, shortness of breath, no recent voice change.    he does not have family history of thyroid dysfunction that he is aware of, and denies family hx of thyroid cancer. he denies personal history of goiter. he is not on any anti-thyroid medications nor on any thyroid hormone supplements. Denies use of Biotin containing supplements.  he is willing to proceed with appropriate work up and therapy for thyrotoxicosis.  He has never had any imaging of his thyroid in the past.  He did have similar instance back in 2021 with marginally suppressed TSH but normal other TFTs.   Review of systems  Constitutional: + Minimally fluctuating body weight,  current Body mass index is 25.81 kg/m. , no fatigue, no subjective hyperthermia, no subjective hypothermia Eyes: no blurry vision, no xerophthalmia ENT: no sore throat, no nodules palpated in throat, no dysphagia/odynophagia, no hoarseness Cardiovascular: no chest pain, no shortness of breath, no palpitations, no leg swelling Respiratory: no cough, no shortness of breath Gastrointestinal: no nausea/vomiting/diarrhea Musculoskeletal: no muscle/joint aches Skin: no rashes, no hyperemia Neurological: no tremors, no numbness, no tingling, no dizziness Psychiatric: no depression, no anxiety   Objective:    BP (!) 156/92 (BP Location: Left Arm) Comment: Patient reports that he has not taken his BP  medication this morning  Ht 6' 1.25" (1.861 m)   Wt 197 lb (89.4 kg)   BMI 25.81 kg/m   Wt Readings from Last 3 Encounters:  12/10/21 197 lb (89.4 kg)  08/05/21 182 lb (82.6 kg)  02/25/21 171 lb 3.2 oz (77.7 kg)     BP Readings from Last 3 Encounters:  12/10/21 (!) 156/92  08/05/21 (!) 142/87  02/25/21 131/78                        Physical Exam- Limited  Constitutional:  Body mass index is 25.81 kg/m. , not in acute distress, normal state of mind Eyes:  EOMI, no exophthalmos Musculoskeletal: no gross deformities, strength intact in all four extremities, no gross restriction of joint movements Skin:  no rashes, no hyperemia  Neurological: no tremor with outstretched hands   CMP     Component Value Date/Time   NA 140 03/26/2020 0734   K 3.7 03/26/2020 0734   CL 100 03/26/2020 0734   CO2 33 (H) 03/26/2020 0734   GLUCOSE 126 (H) 03/26/2020 0734   BUN 22 (H) 03/26/2020 0734   CREATININE 1.38 (H) 03/26/2020 0734   CALCIUM 9.5 03/26/2020 0734   PROT 6.7 03/19/2020 1111   ALBUMIN 3.7 03/19/2020 1111   AST 20 03/19/2020 1111   ALT 25 03/19/2020 1111   ALKPHOS 58 03/19/2020 1111   BILITOT 0.6 03/19/2020 1111   GFRNONAA >60 03/26/2020 0734     CBC    Component Value Date/Time   WBC 4.1 03/26/2020 0734   RBC 3.66 (L) 03/26/2020 0734   HGB 10.8 (L) 03/26/2020 0734   HCT 34.7 (L) 03/26/2020 0734   PLT 123 (L) 03/26/2020 0734   MCV 94.8 03/26/2020 0734   MCH 29.5 03/26/2020 0734   MCHC 31.1 03/26/2020 0734   RDW 13.5 03/26/2020 0734   LYMPHSABS 1.8 03/26/2020 0734   MONOABS 0.3 03/26/2020 0734   EOSABS 0.1 03/26/2020 0734   BASOSABS 0.0 03/26/2020 0734     Diabetic Labs (most recent): Lab Results  Component Value Date   HGBA1C 5.8 (H) 01/05/2020   HGBA1C 6.7 (H) 11/29/2019    Lipid Panel     Component Value Date/Time   CHOL 107 03/22/2020 0236   TRIG 34 03/22/2020 0236   HDL 51 03/22/2020 0236   CHOLHDL 2.1 03/22/2020 0236   VLDL 7 03/22/2020 0236    LDLCALC 49 03/22/2020 0236     Lab Results  Component Value Date   TSH 0.23 (A) 08/03/2021   TSH 0.23 (A) 06/23/2021   TSH 0.21 (A) 02/11/2021   TSH 0.06 (A) 01/13/2021   TSH 0.447 11/30/2019   TSH 0.162 (L) 11/29/2019   FREET4 1.10 11/30/2019     TSH TSH Resulted: 02/11/21 0000  Result status: Final  Resulting lab: OTHER  Reference range: 0.41 - 5.90  Value: 0.21 Abnormal    Comment: FT4- 1.26, T3- 82; Thyro ab- < 1; TPO ab- 22  *Additional information available - comment   08/03/21 0000   Result status: Final  Resulting lab: OTHER  Reference range: 0.41 - 5.90  Value: 0.23 Abnormal    Comment: T4,Free 1.20, Thyroglobulin Antibody <1.0,     Assessment & Plan:   1) Subclinical Hyperthyroidism  he is being seen at a kind request of Medicine, [facility].  His repeat thyroid function tests show normal thyroid function.  His antibody testing was negative ruling out autoimmune thyroid dysfunction.  He still reports no obvious symptoms of overactive thyroid.   He does not require any medical intervention or further testing at this time.  At this point, he can have his thyroid labs checked annually with his PCP and follow up here PRN.        -Patient is advised to maintain close follow up with Medicine, [facility] for primary care needs.    I spent 20 minutes in the care of the patient today including review of labs from Thyroid Function, CMP, and other relevant labs ; imaging/biopsy records (current and previous including abstractions from other facilities); face-to-face time discussing  his lab results and symptoms, medications doses, his options of short and long term treatment based on the latest standards of care / guidelines;   and documenting the encounter.  Kevin Shan  Hislop  participated in the discussions, expressed understanding, and voiced agreement with the above plans.  All questions were answered to his satisfaction. he is  encouraged to contact clinic should he have any questions or concerns prior to his return visit.  Follow up plan: Return if symptoms worsen or fail to improve.   Thank you for involving me in the care of this pleasant patient, and I will continue to update you with his progress.    Ronny Bacon, [facility] [address] [place], [place] [address] Phone: [phone] Fax: [phone]  12/10/2021, 10:17 AM

## 2023-04-07 ENCOUNTER — Emergency Department (HOSPITAL_COMMUNITY)
Admission: EM | Admit: 2023-04-07 | Discharge: 2023-04-09 | Disposition: A | Discharge status: Home / Self Care | Attending: Emergency Medicine | Admitting: Emergency Medicine

## 2023-04-07 ENCOUNTER — Other Ambulatory Visit: Payer: Self-pay

## 2023-04-07 ENCOUNTER — Encounter (HOSPITAL_COMMUNITY): Payer: Self-pay

## 2023-04-07 DIAGNOSIS — R4689 Other symptoms and signs involving appearance and behavior: Secondary | ICD-10-CM | POA: Insufficient documentation

## 2023-04-07 DIAGNOSIS — Z7901 Long term (current) use of anticoagulants: Secondary | ICD-10-CM | POA: Diagnosis not present

## 2023-04-07 DIAGNOSIS — F0390 Unspecified dementia without behavioral disturbance: Secondary | ICD-10-CM | POA: Insufficient documentation

## 2023-04-07 DIAGNOSIS — F0280 Dementia in other diseases classified elsewhere without behavioral disturbance: Secondary | ICD-10-CM | POA: Insufficient documentation

## 2023-04-07 HISTORY — DX: Thyrotoxicosis, unspecified with thyrotoxic crisis or storm: E05.91

## 2023-04-07 HISTORY — DX: Other symptoms and signs involving the genitourinary system: R39.89

## 2023-04-07 HISTORY — DX: Weakness: R53.1

## 2023-04-07 HISTORY — DX: Vitamin D deficiency, unspecified: E55.9

## 2023-04-07 HISTORY — DX: Unspecified dementia, unspecified severity, with agitation: F03.911

## 2023-04-07 HISTORY — DX: Other fecal abnormalities: R19.5

## 2023-04-07 HISTORY — DX: Anxiety disorder, unspecified: F41.9

## 2023-04-07 HISTORY — DX: Nausea with vomiting, unspecified: R11.2

## 2023-04-07 HISTORY — DX: 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: F06.2

## 2023-04-07 HISTORY — DX: Dementia in other diseases classified elsewhere, severe, without behavioral disturbance, psychotic disturbance, mood disturbance, and anxiety: F02.C0

## 2023-04-07 HISTORY — DX: High risk heterosexual behavior: Z72.51

## 2023-04-07 HISTORY — DX: Altered mental status, unspecified: R41.82

## 2023-04-07 HISTORY — DX: Personal history of transient ischemic attack (TIA), and cerebral infarction without residual deficits: Z86.73

## 2023-04-07 HISTORY — DX: Constipation, unspecified: K59.00

## 2023-04-07 HISTORY — DX: Heart failure, unspecified: I50.9

## 2023-04-07 HISTORY — DX: Personal history of COVID-19: Z86.16

## 2023-04-07 HISTORY — DX: Male erectile dysfunction, unspecified: N52.9

## 2023-04-07 HISTORY — DX: Anemia, unspecified: D64.9

## 2023-04-07 HISTORY — DX: Vascular dementia, mild, with psychotic disturbance: F01.A2

## 2023-04-07 HISTORY — DX: Major depressive disorder, single episode, unspecified: F32.9

## 2023-04-07 HISTORY — DX: Pain, unspecified: R52

## 2023-04-07 LAB — URINALYSIS, ROUTINE W REFLEX MICROSCOPIC
Bilirubin Urine: NEGATIVE
Glucose, UA: NEGATIVE mg/dL
Hgb urine dipstick: NEGATIVE
Ketones, ur: 5 mg/dL — AB
Nitrite: NEGATIVE
Protein, ur: 30 mg/dL — AB
Specific Gravity, Urine: 1.025 (ref 1.005–1.030)
pH: 5 (ref 5.0–8.0)

## 2023-04-07 LAB — CBC WITH DIFFERENTIAL/PLATELET
Abs Immature Granulocytes: 0.04 10*3/uL (ref 0.00–0.07)
Basophils Absolute: 0 10*3/uL (ref 0.0–0.1)
Basophils Relative: 0 %
Eosinophils Absolute: 0.1 10*3/uL (ref 0.0–0.5)
Eosinophils Relative: 1 %
HCT: 40.6 % (ref 39.0–52.0)
Hemoglobin: 12.5 g/dL — ABNORMAL LOW (ref 13.0–17.0)
Immature Granulocytes: 1 %
Lymphocytes Relative: 43 %
Lymphs Abs: 2.3 10*3/uL (ref 0.7–4.0)
MCH: 28.6 pg (ref 26.0–34.0)
MCHC: 30.8 g/dL (ref 30.0–36.0)
MCV: 92.9 fL (ref 80.0–100.0)
Monocytes Absolute: 0.4 10*3/uL (ref 0.1–1.0)
Monocytes Relative: 8 %
Neutro Abs: 2.4 10*3/uL (ref 1.7–7.7)
Neutrophils Relative %: 47 %
Platelets: 127 10*3/uL — ABNORMAL LOW (ref 150–400)
RBC: 4.37 MIL/uL (ref 4.22–5.81)
RDW: 13.8 % (ref 11.5–15.5)
WBC: 5.2 10*3/uL (ref 4.0–10.5)
nRBC: 0.4 % — ABNORMAL HIGH (ref 0.0–0.2)

## 2023-04-07 LAB — BASIC METABOLIC PANEL WITH GFR
Anion gap: 12 (ref 5–15)
BUN: 16 mg/dL (ref 6–20)
CO2: 31 mmol/L (ref 22–32)
Calcium: 9.3 mg/dL (ref 8.9–10.3)
Chloride: 98 mmol/L (ref 98–111)
Creatinine, Ser: 1.26 mg/dL — ABNORMAL HIGH (ref 0.61–1.24)
GFR, Estimated: >60 mL/min (ref >60)
Glucose, Bld: 121 mg/dL — ABNORMAL HIGH (ref 70–99)
Potassium: 3.5 mmol/L (ref 3.5–5.1)
Sodium: 141 mmol/L (ref 135–145)
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
## 2023-04-07 NOTE — ED Notes (Signed)
TTS computer at bedside 

## 2023-04-08 LAB — CBG MONITORING, ED
Glucose-Capillary: 83 mg/dL (ref 70–99)
Glucose-Capillary: 207 mg/dL — ABNORMAL HIGH (ref 70–99)
Glucose-Capillary: 176 mg/dL — ABNORMAL HIGH (ref 70–99)

## 2023-04-08 MED ORDER — THIAMINE MONONITRATE 100 MG PO TABS
100.0000 mg | ORAL_TABLET | Freq: Every day | ORAL | Status: DC
  Administered 2023-04-08 – 2023-04-09 (×2): 100 mg via ORAL
  Filled 2023-04-08 (×2): qty 1

## 2023-04-08 MED ORDER — INSULIN LISPRO 100 UNIT/ML IJ SOLN
5.0000 [IU] | Freq: Three times a day (TID) | INTRAMUSCULAR | Status: DC
  Filled 2023-04-08: qty 10

## 2023-04-08 MED ORDER — PAROXETINE HCL 10 MG PO TABS
10.0000 mg | ORAL_TABLET | Freq: Every day | ORAL | Status: DC
  Administered 2023-04-08 – 2023-04-09 (×2): 10 mg via ORAL
  Filled 2023-04-08 (×2): qty 1

## 2023-04-08 MED ORDER — ATORVASTATIN CALCIUM 40 MG PO TABS: 40.0000 mg | ORAL_TABLET | Freq: Every day | ORAL | Status: DC

## 2023-04-08 MED ORDER — THIAMINE HCL 100 MG PO TABS
100.0000 mg | ORAL_TABLET | Freq: Every day | ORAL | Status: DC
  Filled 2023-04-08 (×2): qty 1

## 2023-04-08 MED ORDER — VITAMIN C 500 MG PO TABS
500.0000 mg | ORAL_TABLET | Freq: Every day | ORAL | Status: DC
  Administered 2023-04-08 – 2023-04-09 (×2): 500 mg via ORAL
  Filled 2023-04-08 (×2): qty 1

## 2023-04-08 MED ORDER — HYDROCHLOROTHIAZIDE 25 MG PO TABS
25.0000 mg | ORAL_TABLET | Freq: Every day | ORAL | Status: DC
Start: 2023-04-08 — End: 2023-04-09
  Administered 2023-04-08 – 2023-04-09 (×2): 25 mg via ORAL
  Filled 2023-04-08 (×2): qty 1

## 2023-04-08 MED ORDER — METFORMIN HCL 500 MG PO TABS
500.0000 mg | ORAL_TABLET | Freq: Two times a day (BID) | ORAL | Status: DC
  Administered 2023-04-08 – 2023-04-09 (×3): 500 mg via ORAL
  Filled 2023-04-08 (×3): qty 1

## 2023-04-08 MED ORDER — VITAMIN D 25 MCG (1000 UNIT) PO TABS
2000.0000 [IU] | ORAL_TABLET | Freq: Every day | ORAL | Status: DC
  Administered 2023-04-08 – 2023-04-09 (×2): 2000 [IU] via ORAL
  Filled 2023-04-08 (×2): qty 2

## 2023-04-08 MED ORDER — APIXABAN 5 MG PO TABS
5.0000 mg | ORAL_TABLET | Freq: Two times a day (BID) | ORAL | Status: DC
  Administered 2023-04-08 – 2023-04-09 (×2): 5 mg via ORAL
  Filled 2023-04-08 (×2): qty 1

## 2023-04-08 MED ORDER — FERROUS SULFATE 325 (65 FE) MG PO TABS
325.0000 mg | ORAL_TABLET | Freq: Every day | ORAL | Status: DC
  Administered 2023-04-08 – 2023-04-09 (×2): 325 mg via ORAL
  Filled 2023-04-08 (×2): qty 1

## 2023-04-08 MED ORDER — AMLODIPINE BESYLATE 5 MG PO TABS
10.0000 mg | ORAL_TABLET | Freq: Every day | ORAL | Status: DC
  Administered 2023-04-08 – 2023-04-09 (×2): 10 mg via ORAL
  Filled 2023-04-08 (×2): qty 2

## 2023-04-08 MED ORDER — INSULIN ASPART 100 UNIT/ML IJ SOLN
5.0000 [IU] | Freq: Three times a day (TID) | INTRAMUSCULAR | Status: DC
  Administered 2023-04-09: 5 [IU] via SUBCUTANEOUS
  Filled 2023-04-08: qty 1

## 2023-04-08 MED ORDER — DIVALPROEX SODIUM 125 MG PO CSDR
500.0000 mg | DELAYED_RELEASE_CAPSULE | Freq: Two times a day (BID) | ORAL | Status: DC
  Administered 2023-04-08 – 2023-04-09 (×2): 500 mg via ORAL
  Filled 2023-04-08 (×2): qty 4

## 2023-04-08 MED ORDER — LISINOPRIL 10 MG PO TABS
40.0000 mg | ORAL_TABLET | Freq: Every day | ORAL | Status: DC
  Administered 2023-04-08 – 2023-04-09 (×2): 40 mg via ORAL
  Filled 2023-04-08 (×2): qty 4

## 2023-04-08 MED ORDER — MIRTAZAPINE 15 MG PO TABS: 15.0000 mg | ORAL_TABLET | Freq: Every day | ORAL | Status: DC

## 2023-04-08 MED ORDER — INSULIN GLARGINE-YFGN 100 UNIT/ML ~~LOC~~ SOLN
15.0000 [IU] | Freq: Every day | SUBCUTANEOUS | Status: DC
  Administered 2023-04-08 – 2023-04-09 (×2): 15 [IU] via SUBCUTANEOUS
  Filled 2023-04-08 (×3): qty 0.15

## 2023-04-08 NOTE — ED Provider Notes (Signed)
 Emergency Medicine Observation Re-evaluation Note  Kevin [name] is a 58 y.o. male, seen on rounds today.  Pt initially presented to the ED for complaints of Aggressive Behavior Currently, the patient is sleeping.  Physical Exam  BP (!) 158/91   Pulse 71   Temp 98.2 F (36.8 C) (Oral)   Resp 16   Ht 6\' 2"  (1.88 m)   Wt 91.2 kg   SpO2 95%   BMI 25.81 kg/m  Physical Exam General: No acute distress Cardiac: Well-perfused Lungs: Nonlabored Psych: Unable to evaluate  ED Course / MDM  EKG:   I have reviewed the labs performed to date as well as medications administered while in observation.  Recent changes in the last 24 hours include medical evaluation.  Plan  Current plan is for psychiatry evaluation this morning.  I have ordered his home medications.Terrilee Files, MD 04/08/23 [phone]

## 2023-04-08 NOTE — ED Notes (Signed)
 Leah from [facility] called for an update and let her know we had another TTS visit scheduled for him and would get more information after the follow-up visit.

## 2023-04-09 DIAGNOSIS — R4689 Other symptoms and signs involving appearance and behavior: Secondary | ICD-10-CM | POA: Insufficient documentation

## 2023-04-09 DIAGNOSIS — F0390 Unspecified dementia without behavioral disturbance: Secondary | ICD-10-CM

## 2023-04-09 LAB — CBG MONITORING, ED: Glucose-Capillary: 123 mg/dL — ABNORMAL HIGH (ref 70–99)

## 2023-04-09 NOTE — ED Notes (Signed)
 Attempted to contact family to inform of discharge back to facility with no success. Will attempt again later.

## 2023-04-09 NOTE — ED Notes (Signed)
 Patient had bowel movement in pants. Patient getting cleaned up and changed prior to discharge.
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
## 2023-04-09 NOTE — Discharge Instructions (Signed)
Follow-up as instructed by behavioral health 

## 2023-04-09 NOTE — ED Notes (Signed)
 Linen changed and patient ambulated to restroom.

## 2023-04-09 NOTE — ED Notes (Signed)
 [facility] here to take patient back to [facility].

## 2023-04-09 NOTE — ED Notes (Signed)
 Leah from [facility] called for update. Informed Her that we are still awaiting revaluation. Patient has been resting, since this nurse came on to shift.

## 2023-04-09 NOTE — ED Provider Notes (Signed)
 Emergency Medicine Observation Re-evaluation Note  Kevin [name] is a 58 y.o. male, seen on rounds today.  Pt initially presented to the ED for complaints of Aggressive Behavior Currently, the patient is sleeping.  Physical Exam  BP (!) 143/88 (BP Location: Left Arm)   Pulse 61   Temp 98.6 F (37 C)   Resp 16   Ht 6\' 2"  (1.88 m)   Wt 91.2 kg   SpO2 97%   BMI 25.81 kg/m  Physical Exam General: Sleeping Cardiac: Extremities well-perfused Lungs: Breathing is unlabored Psych: Deferred  ED Course / MDM  EKG:   I have reviewed the labs performed to date as well as medications administered while in observation.  Recent changes in the last 24 hours include none.  Plan  Current plan is for TTS reevaluation.    Gloris Manchester, MD 04/09/23 [phone]

## 2023-04-09 NOTE — ED Notes (Signed)
 Patient given meal tray.

## 2023-04-09 NOTE — ED Notes (Signed)
 Patient on stretcher to be transported back to [facility].

## 2023-04-09 NOTE — ED Notes (Addendum)
 Kevin [name] endorses that. "Sindy Guadeloupe, NP recommends continuous observation. Pt will be seen by psychiatry in AM"
